# Patient Record
Sex: Male | Born: 1935
Health system: Southern US, Community
[De-identification: ages and names within clinical notes are randomized; demographics above are authoritative.]

## PROBLEM LIST (undated history)

## (undated) DIAGNOSIS — M1990 Unspecified osteoarthritis, unspecified site: Secondary | ICD-10-CM

## (undated) DIAGNOSIS — E785 Hyperlipidemia, unspecified: Secondary | ICD-10-CM

## (undated) DIAGNOSIS — N400 Enlarged prostate without lower urinary tract symptoms: Secondary | ICD-10-CM

## (undated) DIAGNOSIS — E119 Type 2 diabetes mellitus without complications: Secondary | ICD-10-CM

## (undated) DIAGNOSIS — K219 Gastro-esophageal reflux disease without esophagitis: Secondary | ICD-10-CM

## (undated) DIAGNOSIS — I2510 Atherosclerotic heart disease of native coronary artery without angina pectoris: Secondary | ICD-10-CM

## (undated) DIAGNOSIS — I10 Essential (primary) hypertension: Secondary | ICD-10-CM

## (undated) DIAGNOSIS — H9190 Unspecified hearing loss, unspecified ear: Secondary | ICD-10-CM

## (undated) DIAGNOSIS — Z974 Presence of external hearing-aid: Secondary | ICD-10-CM

## (undated) DIAGNOSIS — M5020 Other cervical disc displacement, unspecified cervical region: Secondary | ICD-10-CM

## (undated) DIAGNOSIS — C4441 Basal cell carcinoma of skin of scalp and neck: Secondary | ICD-10-CM

## (undated) HISTORY — DX: Presence of external hearing-aid: Z97.4

## (undated) HISTORY — PX: BASAL CELL CARCINOMA EXCISION: SHX1214

## (undated) HISTORY — DX: Type 2 diabetes mellitus without complications: E11.9

## (undated) HISTORY — DX: Essential (primary) hypertension: I10

## (undated) HISTORY — PX: CERVICAL FUSION: SHX112

## (undated) HISTORY — DX: Atherosclerotic heart disease of native coronary artery without angina pectoris: I25.10

## (undated) HISTORY — DX: Other cervical disc displacement, unspecified cervical region: M50.20

## (undated) HISTORY — PX: COLONOSCOPY: SHX174

## (undated) HISTORY — DX: Benign prostatic hyperplasia without lower urinary tract symptoms: N40.0

## (undated) HISTORY — DX: Gastro-esophageal reflux disease without esophagitis: K21.9

## (undated) HISTORY — DX: Unspecified osteoarthritis, unspecified site: M19.90

## (undated) HISTORY — DX: Hyperlipidemia, unspecified: E78.5

## (undated) HISTORY — DX: Basal cell carcinoma of skin of scalp and neck: C44.41

---

## 1989-09-23 HISTORY — PX: LITHOTRIPSY: SUR834

## 1998-12-19 ENCOUNTER — Ambulatory Visit (HOSPITAL_COMMUNITY): Admission: RE | Admit: 1998-12-19 | Discharge: 1998-12-19 | Payer: Self-pay | Admitting: Orthopedic Surgery

## 1999-09-24 HISTORY — PX: LACERATION REPAIR: SHX5168

## 1999-09-24 HISTORY — PX: KNEE ARTHROSCOPY: SUR90

## 2000-02-22 HISTORY — PX: BUNIONECTOMY: SHX129

## 2000-05-29 ENCOUNTER — Encounter: Admission: RE | Admit: 2000-05-29 | Discharge: 2000-05-29 | Payer: Self-pay | Admitting: *Deleted

## 2000-05-29 ENCOUNTER — Encounter: Payer: Self-pay | Admitting: *Deleted

## 2000-07-17 ENCOUNTER — Ambulatory Visit (HOSPITAL_COMMUNITY): Admission: RE | Admit: 2000-07-17 | Discharge: 2000-07-18 | Payer: Self-pay | Admitting: Cardiology

## 2000-07-24 DIAGNOSIS — E119 Type 2 diabetes mellitus without complications: Secondary | ICD-10-CM

## 2000-07-24 DIAGNOSIS — I10 Essential (primary) hypertension: Secondary | ICD-10-CM

## 2000-07-24 DIAGNOSIS — I2510 Atherosclerotic heart disease of native coronary artery without angina pectoris: Secondary | ICD-10-CM

## 2000-07-24 DIAGNOSIS — E785 Hyperlipidemia, unspecified: Secondary | ICD-10-CM

## 2000-07-24 HISTORY — DX: Hyperlipidemia, unspecified: E78.5

## 2000-07-24 HISTORY — DX: Type 2 diabetes mellitus without complications: E11.9

## 2000-07-24 HISTORY — DX: Atherosclerotic heart disease of native coronary artery without angina pectoris: I25.10

## 2000-07-24 HISTORY — DX: Essential (primary) hypertension: I10

## 2000-10-24 ENCOUNTER — Encounter: Payer: Self-pay | Admitting: Family Medicine

## 2000-10-24 LAB — CONVERTED CEMR LAB
Hgb A1c MFr Bld: 5.5 %
Microalbumin U total vol: 7.9 mg/L
PSA: 0.5 ng/mL

## 2000-10-27 ENCOUNTER — Encounter: Payer: Self-pay | Admitting: Family Medicine

## 2000-10-27 LAB — CONVERTED CEMR LAB
Hgb A1c MFr Bld: 5.5 %
TSH: 1.36 microintl units/mL

## 2000-10-28 ENCOUNTER — Encounter: Payer: Self-pay | Admitting: Family Medicine

## 2000-10-28 LAB — CONVERTED CEMR LAB: Microalbumin U total vol: 7.9 mg/L

## 2000-10-31 ENCOUNTER — Encounter: Payer: Self-pay | Admitting: Family Medicine

## 2000-10-31 LAB — CONVERTED CEMR LAB: PSA: 0.5 ng/mL

## 2001-12-11 ENCOUNTER — Encounter: Payer: Self-pay | Admitting: Family Medicine

## 2001-12-11 LAB — CONVERTED CEMR LAB
PSA: 0.5 ng/mL
TSH: 1.29 microintl units/mL

## 2001-12-29 ENCOUNTER — Encounter: Payer: Self-pay | Admitting: Family Medicine

## 2001-12-29 LAB — CONVERTED CEMR LAB: TSH: 2.64 microintl units/mL

## 2002-03-30 ENCOUNTER — Encounter: Payer: Self-pay | Admitting: Family Medicine

## 2002-03-30 LAB — CONVERTED CEMR LAB: Hgb A1c MFr Bld: 5.8 %

## 2002-04-23 ENCOUNTER — Encounter: Payer: Self-pay | Admitting: Family Medicine

## 2002-04-23 LAB — CONVERTED CEMR LAB
Hgb A1c MFr Bld: 5.9 %
Microalbumin U total vol: 6 mg/L
PSA: 0.5 ng/mL

## 2002-05-19 ENCOUNTER — Encounter: Payer: Self-pay | Admitting: Family Medicine

## 2002-05-19 LAB — CONVERTED CEMR LAB
Hgb A1c MFr Bld: 5.9 %
Microalbumin U total vol: 6 mg/L
PSA: 0.5 ng/mL
TSH: 1.24 microintl units/mL

## 2002-11-22 ENCOUNTER — Encounter: Payer: Self-pay | Admitting: Family Medicine

## 2002-11-22 LAB — CONVERTED CEMR LAB: Hgb A1c MFr Bld: 6.9 %

## 2002-11-25 ENCOUNTER — Encounter: Payer: Self-pay | Admitting: Family Medicine

## 2002-11-25 LAB — CONVERTED CEMR LAB: Hgb A1c MFr Bld: 6.9 %

## 2003-03-24 ENCOUNTER — Encounter: Payer: Self-pay | Admitting: Family Medicine

## 2003-03-24 LAB — CONVERTED CEMR LAB: Hgb A1c MFr Bld: 5.8 %

## 2003-09-21 ENCOUNTER — Encounter: Payer: Self-pay | Admitting: Family Medicine

## 2003-09-21 LAB — CONVERTED CEMR LAB: Microalbumin U total vol: 3.5 mg/L

## 2003-09-24 ENCOUNTER — Encounter: Payer: Self-pay | Admitting: Family Medicine

## 2003-09-24 LAB — CONVERTED CEMR LAB
Hgb A1c MFr Bld: 6.8 %
Microalbumin U total vol: 3.5 mg/L
PSA: 0.6 ng/mL

## 2003-09-29 ENCOUNTER — Encounter: Payer: Self-pay | Admitting: Family Medicine

## 2003-09-29 LAB — CONVERTED CEMR LAB: Hgb A1c MFr Bld: 6.8 %

## 2003-09-30 ENCOUNTER — Encounter: Payer: Self-pay | Admitting: Family Medicine

## 2003-09-30 LAB — CONVERTED CEMR LAB: PSA: 0.6 ng/mL

## 2003-10-25 HISTORY — PX: ROTATOR CUFF REPAIR: SHX139

## 2003-11-02 ENCOUNTER — Encounter: Admission: RE | Admit: 2003-11-02 | Discharge: 2003-11-02 | Payer: Self-pay | Admitting: Orthopaedic Surgery

## 2003-11-03 ENCOUNTER — Ambulatory Visit (HOSPITAL_BASED_OUTPATIENT_CLINIC_OR_DEPARTMENT_OTHER): Admission: RE | Admit: 2003-11-03 | Discharge: 2003-11-04 | Payer: Self-pay | Admitting: Orthopaedic Surgery

## 2003-11-03 ENCOUNTER — Ambulatory Visit (HOSPITAL_COMMUNITY): Admission: RE | Admit: 2003-11-03 | Discharge: 2003-11-03 | Payer: Self-pay | Admitting: Orthopaedic Surgery

## 2003-12-23 ENCOUNTER — Encounter: Payer: Self-pay | Admitting: Family Medicine

## 2003-12-23 LAB — CONVERTED CEMR LAB: Hgb A1c MFr Bld: 6.2 %

## 2004-01-04 ENCOUNTER — Encounter: Payer: Self-pay | Admitting: Family Medicine

## 2004-01-04 LAB — CONVERTED CEMR LAB: Hgb A1c MFr Bld: 6.2 %

## 2004-07-24 ENCOUNTER — Encounter: Payer: Self-pay | Admitting: Family Medicine

## 2004-07-24 LAB — CONVERTED CEMR LAB: Hgb A1c MFr Bld: 6.2 %

## 2004-07-27 ENCOUNTER — Ambulatory Visit: Payer: Self-pay | Admitting: Family Medicine

## 2004-07-30 ENCOUNTER — Encounter: Payer: Self-pay | Admitting: Family Medicine

## 2004-07-30 LAB — CONVERTED CEMR LAB: Hgb A1c MFr Bld: 6.2 %

## 2004-08-23 ENCOUNTER — Encounter: Payer: Self-pay | Admitting: Family Medicine

## 2004-08-23 LAB — CONVERTED CEMR LAB
Hgb A1c MFr Bld: 5.9 %
Microalbumin U total vol: 16.1 mg/L
PSA: 0.54 ng/mL

## 2004-08-30 ENCOUNTER — Ambulatory Visit: Payer: Self-pay | Admitting: Family Medicine

## 2004-08-30 LAB — CONVERTED CEMR LAB
PSA: 0.54 ng/mL
TSH: 1.44 microintl units/mL

## 2004-09-04 ENCOUNTER — Ambulatory Visit: Payer: Self-pay | Admitting: Family Medicine

## 2004-10-19 ENCOUNTER — Ambulatory Visit: Payer: Self-pay | Admitting: Family Medicine

## 2005-02-21 ENCOUNTER — Encounter: Payer: Self-pay | Admitting: Family Medicine

## 2005-02-21 LAB — CONVERTED CEMR LAB: Hgb A1c MFr Bld: 5.6 %

## 2005-03-04 ENCOUNTER — Ambulatory Visit: Payer: Self-pay | Admitting: Family Medicine

## 2005-03-04 LAB — CONVERTED CEMR LAB: Hgb A1c MFr Bld: 5.6 %

## 2005-03-06 ENCOUNTER — Ambulatory Visit: Payer: Self-pay | Admitting: Family Medicine

## 2005-04-04 ENCOUNTER — Ambulatory Visit: Payer: Self-pay | Admitting: Family Medicine

## 2005-06-10 ENCOUNTER — Ambulatory Visit: Payer: Self-pay | Admitting: Family Medicine

## 2005-11-21 ENCOUNTER — Encounter: Payer: Self-pay | Admitting: Family Medicine

## 2005-11-21 LAB — CONVERTED CEMR LAB
Hgb A1c MFr Bld: 5.6 %
PSA: 0.93 ng/mL

## 2005-11-26 ENCOUNTER — Ambulatory Visit: Payer: Self-pay | Admitting: Family Medicine

## 2005-11-26 LAB — CONVERTED CEMR LAB
Hgb A1c MFr Bld: 5.6 %
Microalbumin U total vol: 3.3 mg/L
TSH: 1.6 microintl units/mL

## 2005-11-27 ENCOUNTER — Encounter: Payer: Self-pay | Admitting: Family Medicine

## 2005-11-27 LAB — CONVERTED CEMR LAB: PSA: 0.93 ng/mL

## 2005-11-28 ENCOUNTER — Ambulatory Visit: Payer: Self-pay | Admitting: Family Medicine

## 2005-12-25 ENCOUNTER — Ambulatory Visit: Payer: Self-pay | Admitting: Family Medicine

## 2006-03-14 ENCOUNTER — Ambulatory Visit: Payer: Self-pay | Admitting: Family Medicine

## 2006-04-17 ENCOUNTER — Emergency Department (HOSPITAL_COMMUNITY): Admission: EM | Admit: 2006-04-17 | Discharge: 2006-04-17 | Payer: Self-pay | Admitting: Emergency Medicine

## 2006-04-25 ENCOUNTER — Ambulatory Visit: Payer: Self-pay | Admitting: Family Medicine

## 2006-04-28 ENCOUNTER — Ambulatory Visit: Payer: Self-pay | Admitting: Family Medicine

## 2006-05-24 ENCOUNTER — Encounter: Payer: Self-pay | Admitting: Family Medicine

## 2006-05-24 LAB — CONVERTED CEMR LAB: Hgb A1c MFr Bld: 5.5 %

## 2006-06-10 ENCOUNTER — Ambulatory Visit: Payer: Self-pay | Admitting: Family Medicine

## 2006-06-10 LAB — CONVERTED CEMR LAB: Hgb A1c MFr Bld: 5.5 %

## 2006-06-12 ENCOUNTER — Ambulatory Visit: Payer: Self-pay | Admitting: Family Medicine

## 2006-07-16 ENCOUNTER — Ambulatory Visit: Payer: Self-pay | Admitting: Family Medicine

## 2006-08-08 ENCOUNTER — Emergency Department (HOSPITAL_COMMUNITY): Admission: EM | Admit: 2006-08-08 | Discharge: 2006-08-08 | Payer: Self-pay | Admitting: Emergency Medicine

## 2006-08-28 ENCOUNTER — Ambulatory Visit: Payer: Self-pay | Admitting: Family Medicine

## 2006-12-08 ENCOUNTER — Ambulatory Visit: Payer: Self-pay | Admitting: Family Medicine

## 2006-12-08 LAB — CONVERTED CEMR LAB
ALT: 26 units/L (ref 0–40)
AST: 24 units/L (ref 0–37)
Albumin: 3.9 g/dL (ref 3.5–5.2)
Alkaline Phosphatase: 37 units/L — ABNORMAL LOW (ref 39–117)
BUN: 14 mg/dL (ref 6–23)
Basophils Absolute: 0 10*3/uL (ref 0.0–0.1)
Basophils Relative: 0.1 % (ref 0.0–1.0)
Bilirubin, Direct: 0.1 mg/dL (ref 0.0–0.3)
CO2: 28 meq/L (ref 19–32)
Calcium: 9.5 mg/dL (ref 8.4–10.5)
Chloride: 104 meq/L (ref 96–112)
Cholesterol: 113 mg/dL (ref 0–200)
Creatinine, Ser: 0.9 mg/dL (ref 0.4–1.5)
Eosinophils Absolute: 0.1 10*3/uL (ref 0.0–0.6)
Eosinophils Relative: 2 % (ref 0.0–5.0)
GFR calc Af Amer: 106 mL/min
GFR calc non Af Amer: 88 mL/min
Glucose, Bld: 114 mg/dL — ABNORMAL HIGH (ref 70–99)
HCT: 40.6 % (ref 39.0–52.0)
HDL: 46.3 mg/dL (ref >39.0)
Hemoglobin: 14 g/dL (ref 13.0–17.0)
Hgb A1c MFr Bld: 5.7 % (ref 4.6–6.0)
LDL Cholesterol: 51 mg/dL (ref 0–99)
Lymphocytes Relative: 29.9 % (ref 12.0–46.0)
MCHC: 34.6 g/dL (ref 30.0–36.0)
MCV: 94.7 fL (ref 78.0–100.0)
Monocytes Absolute: 0.6 10*3/uL (ref 0.2–0.7)
Monocytes Relative: 10.2 % (ref 3.0–11.0)
Neutro Abs: 3.4 10*3/uL (ref 1.4–7.7)
Neutrophils Relative %: 57.8 % (ref 43.0–77.0)
PSA: 0.77 ng/mL (ref 0.10–4.00)
Platelets: 134 10*3/uL — ABNORMAL LOW (ref 150–400)
Potassium: 4.5 meq/L (ref 3.5–5.1)
RBC: 4.28 M/uL (ref 4.22–5.81)
RDW: 12.5 % (ref 11.5–14.6)
Sodium: 140 meq/L (ref 135–145)
Total Bilirubin: 0.8 mg/dL (ref 0.3–1.2)
Total CHOL/HDL Ratio: 2.4
Total Protein: 6 g/dL (ref 6.0–8.3)
Triglycerides: 78 mg/dL (ref 0–149)
VLDL: 16 mg/dL (ref 0–40)
WBC: 5.8 10*3/uL (ref 4.5–10.5)

## 2006-12-09 ENCOUNTER — Encounter: Payer: Self-pay | Admitting: Family Medicine

## 2006-12-09 LAB — CONVERTED CEMR LAB
Hgb A1c MFr Bld: 5.7 %
PSA: 0.77 ng/mL

## 2006-12-11 ENCOUNTER — Ambulatory Visit: Payer: Self-pay | Admitting: Family Medicine

## 2007-01-05 ENCOUNTER — Ambulatory Visit: Payer: Self-pay | Admitting: Family Medicine

## 2007-04-13 ENCOUNTER — Encounter: Payer: Self-pay | Admitting: Family Medicine

## 2007-04-14 DIAGNOSIS — I2510 Atherosclerotic heart disease of native coronary artery without angina pectoris: Secondary | ICD-10-CM | POA: Insufficient documentation

## 2007-04-14 DIAGNOSIS — E1129 Type 2 diabetes mellitus with other diabetic kidney complication: Secondary | ICD-10-CM

## 2007-04-14 DIAGNOSIS — E119 Type 2 diabetes mellitus without complications: Secondary | ICD-10-CM | POA: Insufficient documentation

## 2007-04-14 DIAGNOSIS — R809 Proteinuria, unspecified: Secondary | ICD-10-CM

## 2007-04-14 DIAGNOSIS — I119 Hypertensive heart disease without heart failure: Secondary | ICD-10-CM | POA: Insufficient documentation

## 2007-04-14 DIAGNOSIS — E785 Hyperlipidemia, unspecified: Secondary | ICD-10-CM | POA: Insufficient documentation

## 2007-06-02 ENCOUNTER — Encounter (INDEPENDENT_AMBULATORY_CARE_PROVIDER_SITE_OTHER): Payer: Self-pay | Admitting: *Deleted

## 2007-06-11 ENCOUNTER — Ambulatory Visit: Payer: Self-pay | Admitting: Family Medicine

## 2007-06-11 LAB — CONVERTED CEMR LAB: Hgb A1c MFr Bld: 5.7 % (ref 4.6–6.0)

## 2007-06-29 ENCOUNTER — Ambulatory Visit: Payer: Self-pay | Admitting: Family Medicine

## 2007-07-31 ENCOUNTER — Ambulatory Visit: Payer: Self-pay | Admitting: Family Medicine

## 2007-10-26 ENCOUNTER — Encounter: Payer: Self-pay | Admitting: Family Medicine

## 2007-12-16 ENCOUNTER — Encounter: Payer: Self-pay | Admitting: Family Medicine

## 2007-12-16 DIAGNOSIS — C4441 Basal cell carcinoma of skin of scalp and neck: Secondary | ICD-10-CM

## 2007-12-16 HISTORY — DX: Basal cell carcinoma of skin of scalp and neck: C44.41

## 2007-12-21 ENCOUNTER — Ambulatory Visit: Payer: Self-pay | Admitting: Family Medicine

## 2007-12-21 LAB — CONVERTED CEMR LAB
ALT: 27 units/L (ref 0–53)
AST: 26 units/L (ref 0–37)
Albumin: 3.4 g/dL — ABNORMAL LOW (ref 3.5–5.2)
Alkaline Phosphatase: 39 units/L (ref 39–117)
BUN: 11 mg/dL (ref 6–23)
Basophils Absolute: 0 10*3/uL (ref 0.0–0.1)
Basophils Relative: 0.5 % (ref 0.0–1.0)
Bilirubin, Direct: 0.2 mg/dL (ref 0.0–0.3)
CO2: 33 meq/L — ABNORMAL HIGH (ref 19–32)
Calcium: 9.4 mg/dL (ref 8.4–10.5)
Chloride: 106 meq/L (ref 96–112)
Cholesterol: 123 mg/dL (ref 0–200)
Creatinine, Ser: 0.9 mg/dL (ref 0.4–1.5)
Creatinine,U: 77.2 mg/dL
Eosinophils Absolute: 0.1 10*3/uL (ref 0.0–0.7)
Eosinophils Relative: 2.2 % (ref 0.0–5.0)
GFR calc Af Amer: 106 mL/min
GFR calc non Af Amer: 88 mL/min
Glucose, Bld: 104 mg/dL — ABNORMAL HIGH (ref 70–99)
HCT: 41.5 % (ref 39.0–52.0)
HDL: 42.8 mg/dL (ref >39.0)
Hemoglobin: 14 g/dL (ref 13.0–17.0)
Hgb A1c MFr Bld: 5.8 % (ref 4.6–6.0)
LDL Cholesterol: 60 mg/dL (ref 0–99)
Lymphocytes Relative: 35.7 % (ref 12.0–46.0)
MCHC: 33.6 g/dL (ref 30.0–36.0)
MCV: 96.1 fL (ref 78.0–100.0)
Microalb Creat Ratio: 9.1 mg/g (ref 0.0–30.0)
Microalb, Ur: 0.7 mg/dL (ref 0.0–1.9)
Monocytes Absolute: 0.6 10*3/uL (ref 0.1–1.0)
Monocytes Relative: 11.2 % (ref 3.0–12.0)
Neutro Abs: 2.9 10*3/uL (ref 1.4–7.7)
Neutrophils Relative %: 50.4 % (ref 43.0–77.0)
PSA: 0.52 ng/mL (ref 0.10–4.00)
Platelets: 147 10*3/uL — ABNORMAL LOW (ref 150–400)
Potassium: 4.7 meq/L (ref 3.5–5.1)
RBC: 4.32 M/uL (ref 4.22–5.81)
RDW: 12.9 % (ref 11.5–14.6)
Sodium: 143 meq/L (ref 135–145)
TSH: 2.05 microintl units/mL (ref 0.35–5.50)
Total Bilirubin: 0.9 mg/dL (ref 0.3–1.2)
Total CHOL/HDL Ratio: 2.9
Total Protein: 6.3 g/dL (ref 6.0–8.3)
Triglycerides: 103 mg/dL (ref 0–149)
VLDL: 21 mg/dL (ref 0–40)
WBC: 5.6 10*3/uL (ref 4.5–10.5)

## 2007-12-23 ENCOUNTER — Encounter: Payer: Self-pay | Admitting: Family Medicine

## 2007-12-25 ENCOUNTER — Ambulatory Visit: Payer: Self-pay | Admitting: Family Medicine

## 2008-02-08 ENCOUNTER — Ambulatory Visit: Payer: Self-pay | Admitting: Family Medicine

## 2008-02-08 LAB — CONVERTED CEMR LAB
ALT: 28 units/L (ref 0–53)
AST: 27 units/L (ref 0–37)

## 2008-02-12 ENCOUNTER — Ambulatory Visit: Payer: Self-pay | Admitting: Family Medicine

## 2008-02-12 ENCOUNTER — Encounter (INDEPENDENT_AMBULATORY_CARE_PROVIDER_SITE_OTHER): Payer: Self-pay | Admitting: *Deleted

## 2008-02-12 LAB — CONVERTED CEMR LAB
OCCULT 1: NEGATIVE
OCCULT 2: NEGATIVE
OCCULT 3: NEGATIVE

## 2008-03-29 ENCOUNTER — Encounter: Payer: Self-pay | Admitting: Family Medicine

## 2008-04-01 ENCOUNTER — Ambulatory Visit: Payer: Self-pay | Admitting: Family Medicine

## 2008-04-01 LAB — CONVERTED CEMR LAB
ALT: 30 units/L (ref 0–53)
AST: 29 units/L (ref 0–37)
Cholesterol: 120 mg/dL (ref 0–200)
HDL: 42.5 mg/dL (ref >39.0)
LDL Cholesterol: 59 mg/dL (ref 0–99)
Total CHOL/HDL Ratio: 2.8
Triglycerides: 94 mg/dL (ref 0–149)
VLDL: 19 mg/dL (ref 0–40)

## 2008-04-05 ENCOUNTER — Ambulatory Visit: Payer: Self-pay | Admitting: Family Medicine

## 2008-06-28 ENCOUNTER — Ambulatory Visit: Payer: Self-pay | Admitting: Family Medicine

## 2008-06-28 LAB — CONVERTED CEMR LAB: Hgb A1c MFr Bld: 5.8 % (ref 4.6–6.0)

## 2008-07-14 ENCOUNTER — Ambulatory Visit: Payer: Self-pay | Admitting: Family Medicine

## 2008-12-26 ENCOUNTER — Ambulatory Visit: Payer: Self-pay | Admitting: Family Medicine

## 2008-12-27 LAB — CONVERTED CEMR LAB
ALT: 27 units/L (ref 0–53)
AST: 28 units/L (ref 0–37)
Albumin: 3.8 g/dL (ref 3.5–5.2)
Alkaline Phosphatase: 45 units/L (ref 39–117)
BUN: 21 mg/dL (ref 6–23)
Basophils Absolute: 0 10*3/uL (ref 0.0–0.1)
Basophils Relative: 0.2 % (ref 0.0–3.0)
Bilirubin, Direct: 0.2 mg/dL (ref 0.0–0.3)
CO2: 28 meq/L (ref 19–32)
Calcium: 9.4 mg/dL (ref 8.4–10.5)
Chloride: 109 meq/L (ref 96–112)
Cholesterol: 121 mg/dL (ref 0–200)
Creatinine, Ser: 0.9 mg/dL (ref 0.4–1.5)
Creatinine,U: 201 mg/dL
Eosinophils Absolute: 0.1 10*3/uL (ref 0.0–0.7)
Eosinophils Relative: 2.7 % (ref 0.0–5.0)
GFR calc non Af Amer: 87.26 mL/min (ref >60)
Glucose, Bld: 123 mg/dL — ABNORMAL HIGH (ref 70–99)
HCT: 39.6 % (ref 39.0–52.0)
HDL: 44.9 mg/dL (ref >39.00)
Hemoglobin: 13.7 g/dL (ref 13.0–17.0)
Hgb A1c MFr Bld: 5.8 % (ref 4.6–6.5)
LDL Cholesterol: 56 mg/dL (ref 0–99)
Lymphocytes Relative: 39 % (ref 12.0–46.0)
Lymphs Abs: 1.9 10*3/uL (ref 0.7–4.0)
MCHC: 34.7 g/dL (ref 30.0–36.0)
MCV: 95.2 fL (ref 78.0–100.0)
Microalb Creat Ratio: 9 mg/g (ref 0.0–30.0)
Microalb, Ur: 1.8 mg/dL (ref 0.0–1.9)
Monocytes Absolute: 0.6 10*3/uL (ref 0.1–1.0)
Monocytes Relative: 12.5 % — ABNORMAL HIGH (ref 3.0–12.0)
Neutro Abs: 2.4 10*3/uL (ref 1.4–7.7)
Neutrophils Relative %: 45.6 % (ref 43.0–77.0)
PSA: 0.29 ng/mL (ref 0.10–4.00)
Platelets: 122 10*3/uL — ABNORMAL LOW (ref 150.0–400.0)
Potassium: 5.1 meq/L (ref 3.5–5.1)
RBC: 4.16 M/uL — ABNORMAL LOW (ref 4.22–5.81)
RDW: 12.8 % (ref 11.5–14.6)
Sodium: 141 meq/L (ref 135–145)
TSH: 1.77 microintl units/mL (ref 0.35–5.50)
Total Bilirubin: 1 mg/dL (ref 0.3–1.2)
Total CHOL/HDL Ratio: 3
Total Protein: 6 g/dL (ref 6.0–8.3)
Triglycerides: 99 mg/dL (ref 0.0–149.0)
VLDL: 19.8 mg/dL (ref 0.0–40.0)
WBC: 5 10*3/uL (ref 4.5–10.5)

## 2008-12-28 ENCOUNTER — Ambulatory Visit: Payer: Self-pay | Admitting: Family Medicine

## 2009-01-31 ENCOUNTER — Ambulatory Visit: Payer: Self-pay | Admitting: Family Medicine

## 2009-02-01 ENCOUNTER — Ambulatory Visit: Payer: Self-pay | Admitting: Family Medicine

## 2009-02-01 ENCOUNTER — Encounter (INDEPENDENT_AMBULATORY_CARE_PROVIDER_SITE_OTHER): Payer: Self-pay | Admitting: *Deleted

## 2009-02-01 LAB — CONVERTED CEMR LAB
OCCULT 1: NEGATIVE
OCCULT 2: NEGATIVE
OCCULT 3: NEGATIVE

## 2009-02-07 ENCOUNTER — Encounter: Admission: RE | Admit: 2009-02-07 | Discharge: 2009-02-07 | Payer: Self-pay | Admitting: Internal Medicine

## 2009-03-10 ENCOUNTER — Encounter: Admission: RE | Admit: 2009-03-10 | Discharge: 2009-03-10 | Payer: Self-pay | Admitting: Internal Medicine

## 2009-04-03 ENCOUNTER — Encounter: Payer: Self-pay | Admitting: Family Medicine

## 2009-04-11 ENCOUNTER — Telehealth: Payer: Self-pay | Admitting: Family Medicine

## 2009-04-12 ENCOUNTER — Ambulatory Visit: Payer: Self-pay | Admitting: Family Medicine

## 2009-04-12 DIAGNOSIS — N400 Enlarged prostate without lower urinary tract symptoms: Secondary | ICD-10-CM | POA: Insufficient documentation

## 2009-06-22 ENCOUNTER — Ambulatory Visit: Payer: Self-pay | Admitting: Family Medicine

## 2009-06-26 ENCOUNTER — Ambulatory Visit: Payer: Self-pay | Admitting: Family Medicine

## 2009-06-26 LAB — CONVERTED CEMR LAB: Hgb A1c MFr Bld: 6 % (ref 4.6–6.5)

## 2009-06-29 ENCOUNTER — Ambulatory Visit: Payer: Self-pay | Admitting: Family Medicine

## 2009-08-23 ENCOUNTER — Telehealth: Payer: Self-pay | Admitting: Family Medicine

## 2009-12-15 ENCOUNTER — Telehealth: Payer: Self-pay | Admitting: Family Medicine

## 2009-12-22 ENCOUNTER — Encounter: Payer: Self-pay | Admitting: Family Medicine

## 2009-12-22 LAB — HM DIABETES EYE EXAM: HM Diabetic Eye Exam: NORMAL

## 2009-12-27 ENCOUNTER — Ambulatory Visit: Payer: Self-pay | Admitting: Family Medicine

## 2009-12-27 LAB — CONVERTED CEMR LAB
ALT: 31 units/L (ref 0–53)
AST: 32 units/L (ref 0–37)
Albumin: 3.9 g/dL (ref 3.5–5.2)
Alkaline Phosphatase: 55 units/L (ref 39–117)
BUN: 12 mg/dL (ref 6–23)
Basophils Absolute: 0 10*3/uL (ref 0.0–0.1)
Basophils Relative: 0.6 % (ref 0.0–3.0)
Bilirubin, Direct: 0.2 mg/dL (ref 0.0–0.3)
CO2: 28 meq/L (ref 19–32)
Calcium: 9.2 mg/dL (ref 8.4–10.5)
Chloride: 104 meq/L (ref 96–112)
Cholesterol: 92 mg/dL (ref 0–200)
Creatinine, Ser: 0.8 mg/dL (ref 0.4–1.5)
Creatinine,U: 104.3 mg/dL
Eosinophils Absolute: 0.1 10*3/uL (ref 0.0–0.7)
Eosinophils Relative: 2.2 % (ref 0.0–5.0)
GFR calc non Af Amer: 99.7 mL/min (ref >60)
Glucose, Bld: 114 mg/dL — ABNORMAL HIGH (ref 70–99)
HCT: 39.6 % (ref 39.0–52.0)
HDL: 44.2 mg/dL (ref >39.00)
Hemoglobin: 13.7 g/dL (ref 13.0–17.0)
Hgb A1c MFr Bld: 6 % (ref 4.6–6.5)
LDL Cholesterol: 37 mg/dL (ref 0–99)
Lymphocytes Relative: 37 % (ref 12.0–46.0)
Lymphs Abs: 2.3 10*3/uL (ref 0.7–4.0)
MCHC: 34.7 g/dL (ref 30.0–36.0)
MCV: 92.7 fL (ref 78.0–100.0)
Microalb Creat Ratio: 93 mg/g — ABNORMAL HIGH (ref 0.0–30.0)
Microalb, Ur: 9.7 mg/dL — ABNORMAL HIGH (ref 0.0–1.9)
Monocytes Absolute: 0.7 10*3/uL (ref 0.1–1.0)
Monocytes Relative: 10.7 % (ref 3.0–12.0)
Neutro Abs: 3.1 10*3/uL (ref 1.4–7.7)
Neutrophils Relative %: 49.5 % (ref 43.0–77.0)
PSA: 0.36 ng/mL (ref 0.10–4.00)
Platelets: 134 10*3/uL — ABNORMAL LOW (ref 150.0–400.0)
Potassium: 4.3 meq/L (ref 3.5–5.1)
RBC: 4.27 M/uL (ref 4.22–5.81)
RDW: 14.1 % (ref 11.5–14.6)
Sodium: 139 meq/L (ref 135–145)
TSH: 1.63 microintl units/mL (ref 0.35–5.50)
Total Bilirubin: 0.7 mg/dL (ref 0.3–1.2)
Total CHOL/HDL Ratio: 2
Total Protein: 6.3 g/dL (ref 6.0–8.3)
Triglycerides: 53 mg/dL (ref 0.0–149.0)
VLDL: 10.6 mg/dL (ref 0.0–40.0)
Vitamin B-12: 801 pg/mL (ref 211–911)
WBC: 6.2 10*3/uL (ref 4.5–10.5)

## 2009-12-28 LAB — CONVERTED CEMR LAB: Vit D, 25-Hydroxy: 37 ng/mL (ref 30–89)

## 2010-01-01 ENCOUNTER — Ambulatory Visit: Payer: Self-pay | Admitting: Family Medicine

## 2010-02-02 ENCOUNTER — Ambulatory Visit: Payer: Self-pay | Admitting: Family Medicine

## 2010-02-02 LAB — CONVERTED CEMR LAB
OCCULT 1: NEGATIVE
OCCULT 2: NEGATIVE
OCCULT 3: NEGATIVE

## 2010-02-02 LAB — FECAL OCCULT BLOOD, GUAIAC: Fecal Occult Blood: NEGATIVE

## 2010-02-05 ENCOUNTER — Encounter (INDEPENDENT_AMBULATORY_CARE_PROVIDER_SITE_OTHER): Payer: Self-pay | Admitting: *Deleted

## 2010-02-12 ENCOUNTER — Ambulatory Visit: Payer: Self-pay | Admitting: Family Medicine

## 2010-02-12 LAB — CONVERTED CEMR LAB
ALT: 32 units/L (ref 0–53)
AST: 30 units/L (ref 0–37)

## 2010-04-02 ENCOUNTER — Ambulatory Visit: Payer: Self-pay | Admitting: Family Medicine

## 2010-04-03 LAB — CONVERTED CEMR LAB
ALT: 22 units/L (ref 0–53)
AST: 23 units/L (ref 0–37)
BUN: 15 mg/dL (ref 6–23)
CO2: 33 meq/L — ABNORMAL HIGH (ref 19–32)
Calcium: 9.3 mg/dL (ref 8.4–10.5)
Chloride: 105 meq/L (ref 96–112)
Creatinine, Ser: 0.9 mg/dL (ref 0.4–1.5)
GFR calc non Af Amer: 88.1 mL/min (ref >60)
Glucose, Bld: 116 mg/dL — ABNORMAL HIGH (ref 70–99)
Hgb A1c MFr Bld: 6.1 % (ref 4.6–6.5)
Potassium: 4.9 meq/L (ref 3.5–5.1)
Sodium: 140 meq/L (ref 135–145)

## 2010-04-04 ENCOUNTER — Ambulatory Visit: Payer: Self-pay | Admitting: Family Medicine

## 2010-04-26 ENCOUNTER — Encounter (INDEPENDENT_AMBULATORY_CARE_PROVIDER_SITE_OTHER): Payer: Self-pay | Admitting: *Deleted

## 2010-08-22 ENCOUNTER — Ambulatory Visit: Payer: Self-pay | Admitting: Family Medicine

## 2010-08-22 LAB — HM DIABETES FOOT EXAM

## 2010-08-30 ENCOUNTER — Telehealth: Payer: Self-pay | Admitting: Family Medicine

## 2010-09-12 ENCOUNTER — Telehealth: Payer: Self-pay | Admitting: Family Medicine

## 2010-10-09 ENCOUNTER — Encounter: Payer: Self-pay | Admitting: Family Medicine

## 2010-10-25 NOTE — Miscellaneous (Signed)
  Clinical Lists Changes  Problems: Added new problem of NEOPLASM, MALIGNANT, CARCINOMA, BASAL CELL, RECURRENT SCALP (ICD-173.9) Observations: Added new observation of PAST SURG HX: RUPTURED DISC IN NECK C7 (DR CLONINGER) LITHOTRIPSY (DR PETERSEN) 1991 R HAND LAC REPAIR (DR KUZMA) 1996 R KNEE ARTHROSCOPY 09/1999 R BUNIONECTOMY (DR PETRINITZ) 02/2000 ETT POS  CATH STENT 07/2000 STRESS CARDIOLITE WNL EF 65%:(11/11/2002) ROTATOR CUFF REPAIR RIGHT (DR. August Saucer):  (10/2003) CARDIOLITE EF 57% NORMAL:(10/01/2006) REEXCISION SCALP BCC (DR Talmadge Coventry Ollen Bowl) (12/16/2007) (12/23/2007 8:11)       Past Surgical History:    RUPTURED DISC IN NECK C7 (DR CLONINGER)    LITHOTRIPSY (DR PETERSEN) 1991    R HAND LAC REPAIR (DR KUZMA) 1996    R KNEE ARTHROSCOPY 09/1999    R BUNIONECTOMY (DR PETRINITZ) 02/2000    ETT POS  CATH STENT 07/2000    STRESS CARDIOLITE WNL EF 65%:(11/11/2002)    ROTATOR CUFF REPAIR RIGHT (DR. August Saucer):  (10/2003)    CARDIOLITE EF 57% NORMAL:(10/01/2006)    REEXCISION SCALP BCC (DR Talmadge Coventry Ollen Bowl) (12/16/2007)

## 2010-10-25 NOTE — Assessment & Plan Note (Signed)
Summary: 6 MONTH FOLLOW UP/RBH   Vital Signs:  Patient Profile:   77 Years Old Male Height:     67 inches Weight:      187 pounds Temp:     97.3 degrees F oral Pulse rate:   56 / minute Pulse rhythm:   regular BP sitting:   120 / 60  (left arm) Cuff size:   regular  Vitals Entered By: Providence Crosby (July 14, 2008 10:11 AM)                 Chief Complaint:  6 month followup states medications the same.  History of Present Illness: Here for 6 mo followup, feeling well with no complaints. 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 Has had no problems since being seen.  Continues to watch his sugar intake...wife won't let him eat "anything but lettuce!" Is verty active. Sugars run 100-120.    Prior Medications Reviewed Using: Patient Recall  Current Allergies: No known allergies       Physical Exam  General:     Well-developed,well-nourished,in no acute distress; alert,appropriate and cooperative throughout examination Head:     Normocephalic and atraumatic without obvious abnormalities. No apparent alopecia or balding. Eyes:     Conjunctiva clear bilaterally.  Ears:     External ear exam shows no significant lesions or deformities.  Otoscopic examination reveals clear canals, tympanic membranes are intact bilaterally without bulging, retraction, inflammation or discharge. Hearing is grossly normal bilaterally. H/As bilat. Nose:     External nasal examination shows no deformity or inflammation. Nasal mucosa are pink and moist without lesions or exudates. Mouth:     Oral mucosa and oropharynx without lesions or exudates.  Teeth in good repair. Neck:     No deformities, masses, or tenderness noted. Chest Wall:     No deformities, masses, tenderness or gynecomastia noted. Lungs:     Normal respiratory effort, chest expands symmetrically. Lungs are clear to auscultation, no crackles or wheezes. Heart:     Normal rate and regular rhythm. S1 and S2 normal  without gallop, murmur, click, rub or other extra sounds. Abdomen:     Bowel sounds positive,abdomen soft and non-tender without masses, organomegaly or hernias noted.    Impression & Recommendations:  Problem # 1:  DIABETES MELLITUS, TYPE II (ICD-250.00) Assessment: Improved Great control. Continue as is, may decrease Glyburide next time. His updated medication list for this problem includes:    Metformin Hcl 1000 Mg Tabs (Metformin hcl) .Marland Kitchen... Take one by mouth two times a day    Glyburide 5 Mg Tabs (Glyburide) .Marland Kitchen... Take one by mouth every am    Lisinopril 10 Mg Tabs (Lisinopril) .Marland Kitchen... Take one by mouth once a day    Avandia 8 Mg Tabs (Rosiglitazone maleate) .Marland Kitchen... Take one by mouth once a day    Aspirin 81 Mg Tbec (Aspirin) .Marland Kitchen... Take one by mouth once a day  Labs Reviewed: HgBA1c: 5.8 (06/28/2008)   Creat: 0.9 (12/21/2007)   Microalbumin: 0.7 (12/21/2007)  Last Eye Exam: normal (03/29/2008)   Problem # 2:  HYPERTENSION (ICD-401.9) Assessment: Unchanged Stable. His updated medication list for this problem includes:    Lisinopril 10 Mg Tabs (Lisinopril) .Marland Kitchen... Take one by mouth once a day    Toprol Xl 25 Mg Tb24 (Metoprolol succinate) .Marland Kitchen... Take one by mouth once a day  BP today: 120/60 Prior BP: 120/60 (04/05/2008)  Labs Reviewed: Creat: 0.9 (12/21/2007) Chol: 120 (04/01/2008)   HDL: 42.5 (04/01/2008)  LDL: 59 (04/01/2008)   TG: 94 (04/01/2008)   Problem # 3:  CORONARY ARTERY DISEASE (ICD-414.00) Assessment: Unchanged Denies chest pain with all of his activities. His updated medication list for this problem includes:    Lisinopril 10 Mg Tabs (Lisinopril) .Marland Kitchen... Take one by mouth once a day    Aspirin 81 Mg Tbec (Aspirin) .Marland Kitchen... Take one by mouth once a day    Toprol Xl 25 Mg Tb24 (Metoprolol succinate) .Marland Kitchen... Take one by mouth once a day  Labs Reviewed: Chol: 120 (04/01/2008)   HDL: 42.5 (04/01/2008)   LDL: 59 (04/01/2008)   TG: 94 (04/01/2008)   Complete Medication  List: 1)  Metformin Hcl 1000 Mg Tabs (Metformin hcl) .... Take one by mouth two times a day 2)  Flomax 0.4 Mg Cp24 (Tamsulosin hcl) .... Take one by mouth once a day 3)  Zetia 10 Mg Tabs (Ezetimibe) .... Take one by mouth once a day 4)  Avodart 0.5 Mg Caps (Dutasteride) .... Take one by mouth once a day 5)  Glyburide 5 Mg Tabs (Glyburide) .... Take one by mouth every am 6)  Lisinopril 10 Mg Tabs (Lisinopril) .... Take one by mouth once a day 7)  Avandia 8 Mg Tabs (Rosiglitazone maleate) .... Take one by mouth once a day 8)  Multivitamins Tabs (Multiple vitamin) .... Take one by mouth once a day 9)  Vitamin B-12 250 Mg  .... Take one by mouth once a day 10)  Aspirin 81 Mg Tbec (Aspirin) .... Take one by mouth once a day 11)  Toprol Xl 25 Mg Tb24 (Metoprolol succinate) .... Take one by mouth once a day 12)  Simvastatin 80 Mg Tabs (Simvastatin) .... One tab by mouth at night   Patient Instructions: 1)  RTC for Comp Exam, labs prior 4/10 2)  Poss decrease Glyburide next visit.   ]

## 2010-10-25 NOTE — Letter (Signed)
Summary: [name] letter  [facility]  [address] [place], [place] [address]   Phone: [phone]  Fax: [phone]       04/26/2010 MRN: [id number]  Paul Terrell [address] [place], [place]  [address]  Dear Paul [facility] [place] [place] announce the retirement of Arta Silence, M.D., from full-time practice at the [facility] office effective March 22, 2010 and his plans of returning part-time.  It is important to Paul Terrell and to our practice that you understand that [facility] has seven physicians in our office for your health care needs.  We will continue to offer the same exceptional care that you have today.    Paul Terrell has spoken to many of you about his plans for retirement and returning part-time in the fall.   We will continue to work with you through the transition to schedule appointments for you in the office and meet the high standards that [facility] is committed to.   Again, it is with great pleasure that we share the news that Paul Terrell will return to [facility] in October of 2011 with a reduced schedule.    If you have any questions, or would like to request an appointment with one of our physicians, please call us at [phone] and press the option for Scheduling an appointment.  We take pleasure in providing you with excellent patient care and look forward to seeing you at your next office visit.  Our [facility] Physicians are:  Tillman Abide, M.D. Laurita Quint, M.D. Roxy Manns, M.D. Kerby Nora, M.D. Hannah Beat, M.D. Ruthe Mannan, M.D. We proudly welcomed Raechel Ache, M.D. and Eustaquio Boyden, M.D. to the practice in July/August 2011.  Sincerely,  [facility]

## 2010-10-25 NOTE — Progress Notes (Signed)
Summary: regarding actos  Phone Note Call from Patient   Caller: Paul Terrell [phone] Call For: Shaune Leeks MD Summary of Call: Pt had been taking avandia but that was changed to actos.  He has been taking actos, but we didnt refill last time because you said he is not supposed to be taking it anymore.  Pt is almost out and wife is asking if he is supposed to continue or be off of it.  I cant find in his chart where actos was discontinued.  If he is to continue, refills need to be sent to right source. Initial call taken by: Lowella Petties CMA, AAMA,  September 12, 2010 3:01 PM  Follow-up for Phone Call        I do not think with the amt of litigaqtion going on that I canh in good faith continue to prescribe Actos. He should stop and see me in 2-3 mos with diary of his nos with him. Check sugar once a day in a four day progression, first day before brfst, next before lunch, next before dinner, next before bed, next befoire brfst, etc. Follow-up by: Shaune Leeks MD,  September 12, 2010 3:10 PM  Additional Follow-up for Phone Call Additional follow up Details #1::        Advised pt's wife.  She will call back to schedule appt in about 2 and a half months. Additional Follow-up by: Lowella Petties CMA, AAMA,  September 12, 2010 3:18 PM

## 2010-10-25 NOTE — Assessment & Plan Note (Signed)
Summary: CPX / LFW   Vital Signs:  Patient profile:   77 year old male Weight:      179.75 pounds Temp:     98.3 degrees F oral Pulse rate:   72 / minute Pulse rhythm:   regular BP sitting:   118 / 68  (left arm) Cuff size:   regular  Vitals Entered By: Sydell Axon LPN (January 01, 2010 1:57 PM) CC: 30 Minute checkup, hemoccult cards given to patient, has not been taking his Actos   History of Present Illness: Pt here for Comp Exam. He has stopped Avandia 2 mos, and he had had problems getting his Actos from the drug people. he has not started it "Because his numbers are so good."  has catarracts and tneye doctor saif "Flomax was a risk" if he had eye surgery. He has no complaints and feels well.  Preventive Screening-Counseling & Management  Alcohol-Tobacco     Alcohol drinks/day: <1     Alcohol type: wine, rare     Smoking Status: quit     Packs/Day: 1982/40PYH     Year Quit: 1982     Pack years: 78     Cans of tobacco/week: chews rarely     Passive Smoke Exposure: no  Caffeine-Diet-Exercise     Caffeine use/day: 1     Does Patient Exercise: no     Type of exercise: on the go all the time.  Problems Prior to Update: 1)  Hypertrophy Prostate W/ur Obst & Oth Luts  (ICD-600.01) 2)  Special Screening Malig Neoplasms Other Sites  (ICD-V76.49) 3)  Neoplasm, Malignant, Carcinoma, Basal Cell, Recurrent Scalp  (ICD-173.9) 4)  Special Screening Malignant Neoplasm of Prostate  (ICD-V76.44) 5)  Hypertension  (ICD-401.9) 6)  Hyperlipidemia  (ICD-272.4) 7)  Diabetes Mellitus, Type II  (ICD-250.00) 8)  Coronary Artery Disease  (ICD-414.00)  Medications Prior to Update: 1)  Metformin Hcl 1000 Mg Tabs (Metformin Hcl) .... Take One By Mouth Two Times A Day 2)  Flomax 0.4 Mg Cp24 (Tamsulosin Hcl) .... Take One By Mouth Once A Day 3)  Finasteride 5 Mg Tabs (Finasteride) .... One Tab By Mouth Once Daily 4)  Glyburide 5 Mg Tabs (Glyburide) .... Take One By Mouth Every Am 5)   Lisinopril 10 Mg Tabs (Lisinopril) .... Take One By Mouth Once A Day 6)  Actos 30 Mg Tabs (Pioglitazone Hcl) .... One Tab By Mouth Once Daily 7)  Multivitamins   Tabs (Multiple Vitamin) .... Take One By Mouth Once A Day 8)  Vitamin B-12   250 Mg .... Take One By Mouth Once A Day 9)  Aspirin 81 Mg  Tbec (Aspirin) .... Take One By Mouth Once A Day 10)  Toprol Xl 25 Mg  Tb24 (Metoprolol Succinate) .... Take One By Mouth Once A Day 11)  Simvastatin 80 Mg  Tabs (Simvastatin) .... One Tab By Mouth At Night 12)  Onetouch Ultra System W/device Kit (Blood Glucose Monitoring Suppl) .... Use As Directed 13)  Onetouch Ultra Test  Strp (Glucose Blood) .... Patient Test Once Daily Dx:250.00 14)  Onetouch Ultrasoft Lancets  Misc (Lancets) .... Patient Test Once Daily Dx: 250.00  Allergies: No Known Drug Allergies  Past History:  Past Medical History: Last updated: 04/13/2007 Coronary artery disease: stent placed :07/2000 Diabetes mellitus, type II: 07/2000 Hyperlipidemia: 07/2000 Hypertension: 07/2000  Past Surgical History: Last updated: 12/23/2007 RUPTURED DISC IN NECK C7 (DR CLONINGER) LITHOTRIPSY (DR PETERSEN) 1991 R HAND LAC REPAIR (DR KUZMA) 1996 R  KNEE ARTHROSCOPY 09/1999 R BUNIONECTOMY (DR PETRINITZ) 02/2000 ETT POS  CATH STENT 07/2000 STRESS CARDIOLITE WNL EF 65%:(11/11/2002) ROTATOR CUFF REPAIR RIGHT (DR. August Saucer):  (10/2003) CARDIOLITE EF 57% NORMAL:(10/01/2006) REEXCISION SCALP BCC (DR Talmadge Coventry Ollen Bowl) (12/16/2007)  Family History: Last updated: 01-11-10 Father: DECEASED 45 YOA MI  / HTN Mother: DECEASED 33 YOA STROKE( LIVED 2 YEARS ) CAD, ANGIO. PLASTY X 2 / HTN SISTERS A 70  HTN  CERVICAL CANCER (1978) SISTER A 68  HTN 3 BROTHERS 2 DECEASED MI + HTN 1 BROTHER A 78  HTN   CV: + FATHER MI/ +MOTHER CAD/ + 2 BROTHERS MI HBP: + EVERYONE DM: + SELF GOUT/ARTHRITIS: PROSTATE CANCER:  BREAST/OVARIAN/UTERINE CANCER: + SISTER CERVICAL/ OLDEST SISTER COLON/CANCER: DEPRESSION:  NEGATIVE ETOH/DRUG ABUSE : NEGATIVE OTHER: + STROKE MOTHER  Social History: Last updated: 04/13/2007 Marital Status: Married LIVES WITH WIFE Children: 2  Occupation: CARPENTRY  Risk Factors: Alcohol Use: <1 (01-11-10) Caffeine Use: 1 (01-11-10) Exercise: no (01/11/10)  Risk Factors: Smoking Status: quit (2010/01/11) Packs/Day: 1982/40PYH (01/11/2010) Cans of tobacco/wk: chews rarely (11-Jan-2010) Passive Smoke Exposure: no (01-11-2010)  Family History: Father: DECEASED 12 YOA MI  / HTN Mother: DECEASED 62 YOA STROKE( LIVED 2 YEARS ) CAD, ANGIO. PLASTY X 2 / HTN SISTERS A 70  HTN  CERVICAL CANCER (1978) SISTER A 68  HTN 3 BROTHERS 2 DECEASED MI + HTN 1 BROTHER A 78  HTN   CV: + FATHER MI/ +MOTHER CAD/ + 2 BROTHERS MI HBP: + EVERYONE DM: + SELF GOUT/ARTHRITIS: PROSTATE CANCER:  BREAST/OVARIAN/UTERINE CANCER: + SISTER CERVICAL/ OLDEST SISTER COLON/CANCER: DEPRESSION: NEGATIVE ETOH/DRUG ABUSE : NEGATIVE OTHER: + STROKE MOTHER  Social History: Caffeine use/day:  1  Review of Systems General:  Denies chills, fatigue, fever, sweats, weakness, and weight loss. Eyes:  Denies blurring, discharge, and eye pain; neew catarracts. ENT:  Complains of decreased hearing and ringing in ears; denies earache. CV:  Complains of shortness of breath with exertion; denies chest pain or discomfort, fainting, fatigue, palpitations, swelling of feet, and swelling of hands. Resp:  Denies cough, shortness of breath, and wheezing. GI:  Denies abdominal pain, bloody stools, change in bowel habits, constipation, dark tarry stools, diarrhea, indigestion, loss of appetite, nausea, vomiting, vomiting blood, and yellowish skin color. GU:  Complains of urinary hesitancy; denies discharge, dysuria, nocturia, and urinary frequency. MS:  Complains of stiffness; denies joint pain, low back pain, muscle aches, and cramps; mild in AM. Derm:  Complains of dryness; denies itching and rash; in  winter. Neuro:  Denies numbness, poor balance, tingling, and tremors.  Physical Exam  General:  Well-developed,well-nourished,in no acute distress; alert,appropriate and cooperative throughout examination. Head:  Normocephalic and atraumatic without obvious abnormalities. No apparent alopecia or balding, mild thinning. SinusesNT. Eyes:  Conjunctiva clear bilaterally.  Ears:  External ear exam shows no significant lesions or deformities.  Otoscopic examination reveals clear canals, tympanic membranes are intact bilaterally without bulging, retraction, inflammation or discharge. Hearing poor  bilaterally, even with hearing aids. Nose:  External nasal examination shows no deformity or inflammation. Nasal mucosa are pink and moist without lesions or exudates. Deviated right. Mouth:  Oral mucosa and oropharynx without lesions or exudates.  Teeth in good repair. Very dry mucosa. Neck:  No deformities, masses, or tenderness noted. Chest Wall:  No deformities, masses, tenderness or gynecomastia noted. Lungs:  Normal respiratory effort, chest expands symmetrically. Lungs are clear to auscultation, no crackles or wheezes. Heart:  Normal rate and regular rhythm. S1 and S2 normal without  gallop, murmur, click, rub or other extra sounds. Abdomen:  Bowel sounds positive,abdomen soft and non-tender without masses, organomegaly or hernias noted. Rectal:  No external abnormalities noted. Normal sphincter tone. No rectal masses or tenderness. G neg. Genitalia:  Testes bilaterally descended without nodularity, tenderness or masses. No scrotal masses or lesions. No penis lesions or urethral discharge. Prostate:  Prostate gland firm and smooth, no enlargement, nodularity, tenderness, mass or induration. 20-30gms. Minimally larger R side vs L. Msk:  No deformity or scoliosis noted of thoracic or lumbar spine.   Pulses:  R and L carotid,radial,femoral,dorsalis pedis and posterior tibial pulses are full and equal  bilaterally Extremities:  No clubbing, cyanosis, edema, or deformity noted with normal full range of motion of all joints.  No swelling at all today. Neurologic:  No cranial nerve deficits noted. Station and gait are normal. Sensory, motor and coordinative functions appear intact.  Diabetes Management Exam:    Foot Exam (with socks and/or shoes not present):       Sensory-Pinprick/Light touch:          Left medial foot (L-4): normal          Left dorsal foot (L-5): normal          Left lateral foot (S-1): normal          Right medial foot (L-4): normal          Right dorsal foot (L-5): normal          Right lateral foot (S-1): normal       Sensory-Monofilament:          Left foot: diminished          Right foot: diminished       Inspection:          Left foot: normal          Right foot: normal       Nails:          Left foot: thickened          Right foot: thickened   Impression & Recommendations:  Problem # 1:  HYPERTROPHY PROSTATE W/UR OBST & OTH LUTS (ICD-600.01) Assessment Unchanged Unchanged, stable.  Problem # 2:  HYPERTENSION (ICD-401.9) Assessment: Unchanged Stable, cont meds. His updated medication list for this problem includes:    Lisinopril 10 Mg Tabs (Lisinopril) .Marland Kitchen... Take one by mouth once a day    Toprol Xl 25 Mg Tb24 (Metoprolol succinate) .Marland Kitchen... Take one by mouth once a day  BP today: 118/68 Prior BP: 122/60 (06/29/2009)  Prior 10 Yr Risk Heart Disease: N/A (12/28/2008)  Labs Reviewed: K+: 4.3 (12/27/2009) Creat: : 0.8 (12/27/2009)   Chol: 92 (12/27/2009)   HDL: 44.20 (12/27/2009)   LDL: 37 (12/27/2009)   TG: 53.0 (12/27/2009)  Problem # 3:  HYPERLIPIDEMIA (ICD-272.4) Assessment: Unchanged Good nos, cont curr meds. His updated medication list for this problem includes:    Simvastatin 80 Mg Tabs (Simvastatin) ..... One tab by mouth at night  Labs Reviewed: SGOT: 32 (12/27/2009)   SGPT: 31 (12/27/2009)  Prior 10 Yr Risk Heart Disease: N/A  (12/28/2008)   HDL:44.20 (12/27/2009), 44.90 (12/26/2008)  LDL:37 (12/27/2009), 56 (12/26/2008)  Chol:92 (12/27/2009), 121 (12/26/2008)  Trig:53.0 (12/27/2009), 99.0 (12/26/2008)  Problem # 4:  DIABETES MELLITUS, TYPE II (ICD-250.00) Assessment: Unchanged Good at present but want him to start on Actos. He still see the effectof the Avandia in his system and we will lose his contriol when that finally wears off. The  following medications were removed from the medication list:    Actos 30 Mg Tabs (Pioglitazone hcl) ..... One tab by mouth once daily His updated medication list for this problem includes:    Metformin Hcl 1000 Mg Tabs (Metformin hcl) .Marland Kitchen... Take one by mouth two times a day    Glyburide 5 Mg Tabs (Glyburide) .Marland Kitchen... Take one by mouth every am    Lisinopril 10 Mg Tabs (Lisinopril) .Marland Kitchen... Take one by mouth once a day    Aspirin 81 Mg Tbec (Aspirin) .Marland Kitchen... Take one by mouth once a day  Labs Reviewed: Creat: 0.8 (12/27/2009)   Microalbumin: 3.3 (11/26/2005)  Last Eye Exam: normal (12/22/2009) Reviewed HgBA1c results: 6.0 (12/27/2009)  6.0 (06/26/2009)  Complete Medication List: 1)  Metformin Hcl 1000 Mg Tabs (Metformin hcl) .... Take one by mouth two times a day 2)  Flomax 0.4 Mg Cp24 (Tamsulosin hcl) .... Take one by mouth once a day 3)  Finasteride 5 Mg Tabs (Finasteride) .... One tab by mouth once daily 4)  Glyburide 5 Mg Tabs (Glyburide) .... Take one by mouth every am 5)  Lisinopril 10 Mg Tabs (Lisinopril) .... Take one by mouth once a day 6)  Multivitamins Tabs (Multiple vitamin) .... Take one by mouth once a day 7)  Vitamin B-12 250 Mg  .... Take one by mouth once a day 8)  Aspirin 81 Mg Tbec (Aspirin) .... Take one by mouth once a day 9)  Toprol Xl 25 Mg Tb24 (Metoprolol succinate) .... Take one by mouth once a day 10)  Simvastatin 80 Mg Tabs (Simvastatin) .... One tab by mouth at night 11)  Onetouch Ultra System W/device Kit (Blood glucose monitoring suppl) .... Use as  directed 12)  Onetouch Ultra Test Strp (Glucose blood) .... Patient test once daily dx:250.00 13)  Onetouch Ultrasoft Lancets Misc (Lancets) .... Patient test once daily dx: 250.00  Patient Instructions: 1)  Start Actos. 2)  SGOT SGPT 6 weeks and SGOT SGPT BMet and A1C 3mos, Recheck then.   Current Allergies (reviewed today): No known allergies

## 2010-10-25 NOTE — Letter (Signed)
Summary: Results Follow up Letter  [place] [facility]  [address] [place], [place] [address]   Phone: [phone]  Fax: [phone]    02/12/2008 MRN: [id number]  Paul Terrell [address] [place], [place]  [address]  Dear [name],  The following are the results of your recent test(s):  Test         Result    Pap Smear:        Normal _____  Not Normal _____ Comments: ______________________________________________________ Cholesterol: LDL(Bad cholesterol):         Your goal is less than:         HDL (Good cholesterol):       Your goal is more than: Comments:  ______________________________________________________ Mammogram:        Normal _____  Not Normal _____ Comments:  ___________________________________________________________________ Hemoccult:        Normal _x____  Not normal _______ Comments:  NO BLOOD IN STOOL. THANK YOU FOR RETURNING THE HEMOCCULT CARDS. PLEASE MAKE SURE TO REPEAT IN ONE YEAR.  _____________________________________________________________________ Other Tests:    We routinely do not discuss normal results over the telephone.  If you desire a copy of the results, or you have any questions about this information we can discuss them at your next office visit.   Sincerely,

## 2010-10-25 NOTE — Assessment & Plan Note (Signed)
Summary: MED REFILL/DLO   Vital Signs:  Patient profile:   77 year old male Height:      67 inches Weight:      185.25 pounds BMI:     29.12 Temp:     96.8 degrees F oral Pulse rate:   76 / minute Pulse rhythm:   regular BP sitting:   140 / 70  (left arm) Cuff size:   regular  Vitals Entered By: Linde Gillis CMA Duncan Dull) (August 22, 2010 10:12 AM) CC: medication refill   History of Present Illness: Pt here for He feels well. His sugar runs 100-120, rarely down into the 80s.  His BPO is u[p slightly today but much better than last time.  He is urinatoing ok.  His legs and feet hurt him. His wife got him some support hose. He thinks they help and wanted to know if there was a problem wearing them.  Problems Prior to Update: 1)  Hypertrophy Prostate W/ur Obst & Oth Luts  (ICD-600.01) 2)  Special Screening Malig Neoplasms Other Sites  (ICD-V76.49) 3)  Neoplasm, Malignant, Carcinoma, Basal Cell, Recurrent Scalp  (ICD-173.9) 4)  Special Screening Malignant Neoplasm of Prostate  (ICD-V76.44) 5)  Hypertension  (ICD-401.9) 6)  Hyperlipidemia  (ICD-272.4) 7)  Diabetes Mellitus, Type II  (ICD-250.00) 8)  Coronary Artery Disease  (ICD-414.00)  Medications Prior to Update: 1)  Metformin Hcl 1000 Mg Tabs (Metformin Hcl) .... Take One By Mouth Two Times A Day 2)  Flomax 0.4 Mg Cp24 (Tamsulosin Hcl) .... Take One By Mouth Once A Day 3)  Finasteride 5 Mg Tabs (Finasteride) .... One Tab By Mouth Once Daily 4)  Glyburide 5 Mg Tabs (Glyburide) .... Take One By Mouth Every Am 5)  Lisinopril 10 Mg Tabs (Lisinopril) .... Take One By Mouth Once A Day 6)  Multivitamins   Tabs (Multiple Vitamin) .... Take One By Mouth Once A Day 7)  Vitamin B-12   250 Mg .... Take One By Mouth Once A Day 8)  Aspirin 81 Mg  Tbec (Aspirin) .... Take One By Mouth Once A Day 9)  Toprol Xl 25 Mg  Tb24 (Metoprolol Succinate) .... Take One By Mouth Once A Day 10)  Zocor 40 Mg Tabs (Simvastatin) 11)  Onetouch Ultra  System W/device Kit (Blood Glucose Monitoring Suppl) .... Use As Directed 12)  Onetouch Ultra Test  Strp (Glucose Blood) .... Patient Test Once Daily Dx:250.00 13)  Onetouch Ultrasoft Lancets  Misc (Lancets) .... Patient Test Once Daily Dx: 250.00  Current Medications (verified): 1)  Metformin Hcl 1000 Mg Tabs (Metformin Hcl) .... Take One By Mouth Two Times A Day 2)  Flomax 0.4 Mg Cp24 (Tamsulosin Hcl) .... Take One By Mouth Once A Day 3)  Finasteride 5 Mg Tabs (Finasteride) .... One Tab By Mouth Once Daily 4)  Glyburide 5 Mg Tabs (Glyburide) .... Take One By Mouth Every Am 5)  Lisinopril 10 Mg Tabs (Lisinopril) .... Take One By Mouth Once A Day 6)  Multivitamins   Tabs (Multiple Vitamin) .... Take One By Mouth Once A Day 7)  Vitamin B-12   250 Mg .... Take One By Mouth Once A Day 8)  Aspirin 81 Mg  Tbec (Aspirin) .... Take One By Mouth Once A Day 9)  Toprol Xl 25 Mg  Tb24 (Metoprolol Succinate) .... Take One By Mouth Once A Day 10)  Zocor 40 Mg Tabs (Simvastatin) 11)  Onetouch Ultra System W/device Kit (Blood Glucose Monitoring Suppl) .... Use As Directed  12)  Onetouch Ultra Test  Strp (Glucose Blood) .... Patient Test Once Daily Dx:250.00 13)  Onetouch Ultrasoft Lancets  Misc (Lancets) .... Patient Test Once Daily Dx: 250.00  Allergies (verified): No Known Drug Allergies  Physical Exam  General:  Well-developed,well-nourished,in no acute distress; alert,appropriate and cooperative throughout examination Head:  Normocephalic and atraumatic without obvious abnormalities. No apparent alopecia or balding. Sinuses NT. Eyes:  Conjunctiva clear bilaterally.  Ears:  External ear exam shows no significant lesions or deformities.  Otoscopic examination reveals clear canals, tympanic membranes are intact bilaterally without bulging, retraction, inflammation or discharge. Hearing is mildly decreased  bilaterally. H/As in place bilat. Nose:  External nasal examination shows no deformity or  inflammation. Nasal mucosa are pink and moist without lesions or exudates. Mouth:  Oral mucosa and oropharynx without lesions or exudates.  Teeth in good repair. Very dry mucosa. Neck:  No deformities, masses, or tenderness noted. Lungs:  Normal respiratory effort, chest expands symmetrically. Lungs are clear to auscultation, no crackles or wheezes. Heart:  Normal rate and regular rhythm. S1 and S2 normal without gallop, murmur, click, rub or other extra sounds.  Diabetes Management Exam:    Foot Exam (with socks and/or shoes not present):       Sensory-Pinprick/Light touch:          Left medial foot (L-4): normal          Left dorsal foot (L-5): normal          Left lateral foot (S-1): normal          Right medial foot (L-4): normal          Right dorsal foot (L-5): normal          Right lateral foot (S-1): normal       Sensory-Monofilament:          Left foot: diminished          Right foot: diminished       Inspection:          Left foot: normal          Right foot: normal       Nails:          Left foot: normal          Right foot: normal   Impression & Recommendations:  Problem # 1:  HYPERTENSION (ICD-401.9) Assessment Improved Better but still mildly elevated for DM. Wiull adjust next time if not lower. His updated medication list for this problem includes:    Lisinopril 10 Mg Tabs (Lisinopril) .Marland Kitchen... Take one by mouth once a day    Toprol Xl 25 Mg Tb24 (Metoprolol succinate) .Marland Kitchen... Take one by mouth once a day  BP today: 140/70 Prior BP: 150/80 (04/04/2010)  Prior 10 Yr Risk Heart Disease: N/A (12/28/2008)  Labs Reviewed: K+: 4.9 (04/02/2010) Creat: : 0.9 (04/02/2010)   Chol: 92 (12/27/2009)   HDL: 44.20 (12/27/2009)   LDL: 37 (12/27/2009)   TG: 53.0 (12/27/2009)  Problem # 2:  DIABETES MELLITUS, TYPE II (ICD-250.00) Assessment: Unchanged Stable. Encouraged to cont as is. Actos should now be out of hids system. His updated medication list for this problem  includes:    Metformin Hcl 1000 Mg Tabs (Metformin hcl) .Marland Kitchen... Take one by mouth two times a day    Glyburide 5 Mg Tabs (Glyburide) .Marland Kitchen... Take one by mouth every am    Lisinopril 10 Mg Tabs (Lisinopril) .Marland Kitchen... Take one by mouth once a day  Aspirin 81 Mg Tbec (Aspirin) .Marland Kitchen... Take one by mouth once a day  Labs Reviewed: Creat: 0.9 (04/02/2010)   Microalbumin: 3.3 (11/26/2005)  Last Eye Exam: normal (12/22/2009) Reviewed HgBA1c results: 6.1 (04/02/2010)  6.0 (12/27/2009)  Problem # 3:  HYPERTROPHY PROSTATE W/UR OBST & OTH LUTS (ICD-600.01) Assessment: Unchanged Acceptable.  Complete Medication List: 1)  Metformin Hcl 1000 Mg Tabs (Metformin hcl) .... Take one by mouth two times a day 2)  Flomax 0.4 Mg Cp24 (Tamsulosin hcl) .... Take one by mouth once a day 3)  Finasteride 5 Mg Tabs (Finasteride) .... One tab by mouth once daily 4)  Glyburide 5 Mg Tabs (Glyburide) .... Take one by mouth every am 5)  Lisinopril 10 Mg Tabs (Lisinopril) .... Take one by mouth once a day 6)  Multivitamins Tabs (Multiple vitamin) .... Take one by mouth once a day 7)  Vitamin B-12 250 Mg  .... Take one by mouth once a day 8)  Aspirin 81 Mg Tbec (Aspirin) .... Take one by mouth once a day 9)  Toprol Xl 25 Mg Tb24 (Metoprolol succinate) .... Take one by mouth once a day 10)  Zocor 40 Mg Tabs (Simvastatin) 11)  Onetouch Ultra System W/device Kit (Blood glucose monitoring suppl) .... Use as directed 12)  Onetouch Ultra Test Strp (Glucose blood) .... Patient test once daily dx:250.00 13)  Onetouch Ultrasoft Lancets Misc (Lancets) .... Patient test once daily dx: 250.00  Patient Instructions: 1)  RTC 412 for Comp Exam, labs prior.   Orders Added: 1)  Est. Patient Level III [[address]]    Current Allergies (reviewed today): No known allergies

## 2010-10-25 NOTE — Progress Notes (Signed)
Summary: wants to change to generics  Phone Note Call from Patient Call back at Home Phone [phone]   Caller: Patient Call For: Shaune Leeks MD Summary of Call: Pt wants to change from avodart to finasteride and form avandia to acarbosa (?).  He wants 90 day scripts for [employer] order.  Please call when ready. Initial call taken by: Lowella Petties CMA,  April 11, 2009 8:52 AM  Follow-up for Phone Call        I need to see him to discuss these changes he desires, ?tomm? Follow-up by: Shaune Leeks MD,  April 11, 2009 10:03 AM  Additional Follow-up for Phone Call Additional follow up Details #1::        Lupita Leash will call pt to schedule,they are friends. Additional Follow-up by: Lowella Petties CMA,  April 11, 2009 10:06 AM

## 2010-10-25 NOTE — Letter (Signed)
Summary: Results Follow up Letter  [place] [facility]  [address] [place], [place] [address]   Phone: [phone]  Fax: [phone]    02/05/2010 MRN: [id number]  Paul Terrell [address] [place], [place]  [address]  Dear [name],  The following are the results of your recent test(s):  Test         Result    Pap Smear:        Normal _____  Not Normal _____ Comments: ______________________________________________________ Cholesterol: LDL(Bad cholesterol):         Your goal is less than:         HDL (Good cholesterol):       Your goal is more than: Comments:  ______________________________________________________ Mammogram:        Normal _____  Not Normal _____ Comments:  ___________________________________________________________________ Hemoccult:        Normal __X___  Not normal _______ Comments:  Please repeat in one year.  _____________________________________________________________________ Other Tests:    We routinely do not discuss normal results over the telephone.  If you desire a copy of the results, or you have any questions about this information we can discuss them at your next office visit.   Sincerely,    Laurita Quint, MD

## 2010-10-25 NOTE — Assessment & Plan Note (Signed)
Summary: DIZZY/DLO   Vital Signs:  Patient profile:   77 year old male Height:      67 inches Weight:      181 pounds BMI:     28.45 Temp:     97.3 degrees F oral Pulse rate:   76 / minute Pulse rhythm:   regular BP sitting:   120 / 54  (left arm)  Vitals Entered By: Providence Crosby (Jan 31, 2009 12:25 PM) CC: started Friday severe dizziness// some diarrhea// aching all over // started taking new generic flomax// wonders if this did this??   History of Present Illness: Pt here for starting to hurt all over last Friday with achiness and dizziness and won't resolve. He is not quite as bad as he was Friday today. He has no known exposure. His dizziness is worst with getting up and rollin over in bed.  He started generic for Flomax a few weeks ago. He is also having muscle pain that started since Friday.  He is having headache, no ear pain, having rhinitis that is clear, no ST, mild cough worse in the last few days productive of minimally yellow sputum. He is mildly SOB (he thinks from 74 yoa!), no N/V.  He is dizzy with position changes, no fever but has chills. He has taken ASA or Tyl with good effect.  Problems Prior to Update: 1)  Special Screening Malig Neoplasms Other Sites  (ICD-V76.49) 2)  Neoplasm, Malignant, Carcinoma, Basal Cell, Recurrent Scalp  (ICD-173.9) 3)  Special Screening Malignant Neoplasm of Prostate  (ICD-V76.44) 4)  Hypertension  (ICD-401.9) 5)  Hyperlipidemia  (ICD-272.4) 6)  Diabetes Mellitus, Type II  (ICD-250.00) 7)  Coronary Artery Disease  (ICD-414.00)  Medications Prior to Update: 1)  Metformin Hcl 1000 Mg Tabs (Metformin Hcl) .... Take One By Mouth Two Times A Day 2)  Flomax 0.4 Mg Cp24 (Tamsulosin Hcl) .... Take One By Mouth Once A Day 3)  Avodart 0.5 Mg Caps (Dutasteride) .... Take One By Mouth Once A Day 4)  Glyburide 5 Mg Tabs (Glyburide) .... Take One By Mouth Every Am 5)  Lisinopril 10 Mg Tabs (Lisinopril) .... Take One By Mouth Once A Day 6)   Avandia 8 Mg  Tabs (Rosiglitazone Maleate) .... Take One By Mouth Once A Day 7)  Multivitamins   Tabs (Multiple Vitamin) .... Take One By Mouth Once A Day 8)  Vitamin B-12   250 Mg .... Take One By Mouth Once A Day 9)  Aspirin 81 Mg  Tbec (Aspirin) .... Take One By Mouth Once A Day 10)  Toprol Xl 25 Mg  Tb24 (Metoprolol Succinate) .... Take One By Mouth Once A Day 11)  Simvastatin 80 Mg  Tabs (Simvastatin) .... One Tab By Mouth At Night  Allergies (verified): No Known Drug Allergies  Physical Exam  General:  Well-developed,well-nourished,in no acute distress; alert,appropriate and cooperative throughout examination, nontoxic but with chills when seen. Head:  Normocephalic and atraumatic without obvious abnormalities. No apparent alopecia or balding, mild thinning. Eyes:  Conjunctiva clear bilaterally.  Ears:  External ear exam shows no significant lesions or deformities.  Otoscopic examination reveals clear canals, tympanic membranes are intact bilaterally without bulging, retraction, inflammation or discharge. Hearing is grossly normal bilaterally. H/As bilat. Nose:  External nasal examination shows no deformity or inflammation. Nasal mucosa are pink and moist without lesions or exudates. Deviated right. Mouth:  Oral mucosa and oropharynx without lesions or exudates.  Teeth in good repair. Very dry mucosa. Neck:  No deformities, masses, or tenderness noted. Chest Wall:  No deformities, masses, tenderness or gynecomastia noted. Lungs:  Normal respiratory effort, chest expands symmetrically. Lungs are clear to auscultation, no crackles or wheezes. Heart:  Normal rate and regular rhythm. S1 and S2 normal without gallop, murmur, click, rub or other extra sounds. Abdomen:  Bowel sounds positive,abdomen soft and non-tender without masses, organomegaly or hernias noted.   Impression & Recommendations:  Problem # 1:  URI (ICD-465.9) Assessment New  Viral Syndrome. See instructions. His  updated medication list for this problem includes:    Aspirin 81 Mg Tbec (Aspirin) .Marland Kitchen... Take one by mouth once a day  Instructed on symptomatic treatment. Call if symptoms persist or worsen.   Complete Medication List: 1)  Metformin Hcl 1000 Mg Tabs (Metformin hcl) .... Take one by mouth two times a day 2)  Flomax 0.4 Mg Cp24 (Tamsulosin hcl) .... Take one by mouth once a day 3)  Avodart 0.5 Mg Caps (Dutasteride) .... Take one by mouth once a day 4)  Glyburide 5 Mg Tabs (Glyburide) .... Take one by mouth every am 5)  Lisinopril 10 Mg Tabs (Lisinopril) .... Take one by mouth once a day 6)  Avandia 8 Mg Tabs (Rosiglitazone maleate) .... Take one by mouth once a day 7)  Multivitamins Tabs (Multiple vitamin) .... Take one by mouth once a day 8)  Vitamin B-12 250 Mg  .... Take one by mouth once a day 9)  Aspirin 81 Mg Tbec (Aspirin) .... Take one by mouth once a day 10)  Toprol Xl 25 Mg Tb24 (Metoprolol succinate) .... Take one by mouth once a day 11)  Simvastatin 80 Mg Tabs (Simvastatin) .... One tab by mouth at night  Patient Instructions: 1)  Take tyl ES 2 4 times a day regularly for one week. 2)  Stay on clear liquids for 24 hours but increase liquid intake. 3)  Return if sxs don't improve.

## 2010-10-25 NOTE — Miscellaneous (Signed)
  Clinical Lists Changes  Medications: Changed medication from METFORMIN HCL 1000 MG TABS (METFORMIN HCL) to METFORMIN HCL 1000 MG TABS (METFORMIN HCL) Take one by mouth two times a day Changed medication from FLOMAX 0.4 MG CP24 (TAMSULOSIN HCL) to FLOMAX 0.4 MG CP24 (TAMSULOSIN HCL) Take one by mouth once a day Changed medication from ZETIA 10 MG TABS (EZETIMIBE) to ZETIA 10 MG TABS (EZETIMIBE) Take one by mouth once a day Changed medication from AVODART 0.5 MG CAPS (DUTASTERIDE) to AVODART 0.5 MG CAPS (DUTASTERIDE) Take one by mouth once a day Changed medication from GLYBURIDE 5 MG TABS (GLYBURIDE) to GLYBURIDE 5 MG TABS (GLYBURIDE) Take one by mouth every am Changed medication from LISINOPRIL 10 MG TABS (LISINOPRIL) to LISINOPRIL 10 MG TABS (LISINOPRIL) Take one by mouth once a day Removed medication of LIPITOR 10 MG TABS (ATORVASTATIN CALCIUM) Added new medication of AVANDIA 8 MG  TABS (ROSIGLITAZONE MALEATE) Take one by mouth once a day Added new medication of MULTIVITAMINS   TABS (MULTIPLE VITAMIN) Take one by mouth once a day Added new medication of * VITAMIN B-12   250 MG Take one by mouth once a day Added new medication of ASPIRIN 81 MG  TBEC (ASPIRIN) Take one by mouth once a day Added new medication of TOPROL XL 25 MG  TB24 (METOPROLOL SUCCINATE) Take one by mouth once a day Added new medication of PRAVACHOL 80 MG  TABS (PRAVASTATIN SODIUM) Take one by mouth at bedtime

## 2010-10-25 NOTE — Progress Notes (Signed)
Summary: refill request for actos  Phone Note Refill Request Message from:  Fax from Pharmacy  Refills Requested: Medication #1:  actos 30 mg  take one a day [facility]ed [facility], this is not on med list.  Initial call taken by: Lowella Petties CMA, AAMA,  August 30, 2010 5:01 PM  Follow-up for Phone Call        He is not supposed to be on Actos anymore. Follow-up by: Shaune Leeks MD,  August 30, 2010 5:03 PM  Additional Follow-up for Phone Call Additional follow up Details #1::        [facility]axed [facility] to [facility]. Additional Follow-up by: Lowella Petties CMA, AAMA,  August 30, 2010 5:16 PM

## 2010-10-25 NOTE — Letter (Signed)
Summary: Dilated Fundus Examination,Dr.Christine McCuen,[facility]  Dilated Fundus Examination,Dr.Christine McCuen,[facility]   Imported By: Beau Fanny 12/25/2009 14:56:45  _____________________________________________________________________  External Attachment:    Type:   Image     Comment:   External Document  Appended Document: Dilated Fundus Examination,Dr.Christine McCuen,[facility]    Clinical Lists Changes  Observations: Added new observation of DMEYEEXAMNXT: 12/2010 (12/25/2009 16:55) Added new observation of DMEYEEXMRES: normal (12/22/2009 16:56) Added new observation of EYE EXAM BY: Dr Charlotte Sanes (12/22/2009 16:56) Added new observation of DIAB EYE EX: normal (12/22/2009 16:56)        Diabetes Management Exam:    Eye Exam:       Eye Exam done elsewhere          Date: 12/22/2009          Results: normal          Done by: Dr Charlotte Sanes

## 2010-10-25 NOTE — Letter (Signed)
Summary: [facility] / EYE EXAM RPT  [facility] / EYE EXAM RPT   Imported By: Carin Primrose 04/05/2009 14:37:28  _____________________________________________________________________  External Attachment:    Type:   Image     Comment:   External Document  Appended Document: [facility] / EYE EXAM RPT    Clinical Lists Changes  Observations: Added new observation of DMEYEEXAMNXT: 03/2010 (04/05/2009 15:43) Added new observation of DMEYEEXMRES: normal (04/03/2009 15:43) Added new observation of EYE EXAM BY: Dr Charlotte Sanes (04/03/2009 15:43) Added new observation of DIAB EYE EX: normal (04/03/2009 15:43)        Diabetes Management Exam:    Eye Exam:       Eye Exam done elsewhere          Date: 04/03/2009          Results: normal          Done by: Dr Charlotte Sanes

## 2010-10-25 NOTE — Assessment & Plan Note (Signed)
Summary: CHECK UP/CLE   Vital Signs:  Patient Profile:   77 Years Old Male Height:     67 inches Weight:      187 pounds Temp:     97.7 degrees F oral Pulse rate:   64 / minute Pulse rhythm:   regular BP sitting:   130 / 60  (left arm) Cuff size:   regular  Vitals Entered By: Providence Crosby (December 25, 2007 11:00 AM)                 Chief Complaint:  CHECK UP// HEMOCCULT CARDS TO PATIENT.  History of Present Illness: Here for Comp Exam, had sinus infection and took what he was told last year and it resolved. No complaints today. Insurance will not cover Zetia so will try incresing to Simvastatin.] Sugars have been 100-130 typically.    Prior Medications Reviewed Using: List Brought by Patient  Current Allergies: No known allergies     Risk Factors:  Passive smoke exposure:  no Drug use:  no HIV high-risk behavior:  no Caffeine use:  3 drinks per day Alcohol use:  no Exercise:  no Seatbelt use:  100 %   Review of Systems  Eyes      Denies blurring, discharge, double vision, eye irritation, eye pain, halos, itching, light sensitivity, red eye, vision loss-1 eye, and vision loss-both eyes.  ENT      Complains of decreased hearing.      significant, longstanding  CV      Denies bluish discoloration of lips or nails, chest pain or discomfort, difficulty breathing at night, difficulty breathing while lying down, fainting, fatigue, leg cramps with exertion, lightheadness, near fainting, palpitations, shortness of breath with exertion, swelling of feet, swelling of hands, and weight gain.  Resp      Denies chest discomfort, chest pain with inspiration, cough, coughing up blood, excessive snoring, hypersomnolence, morning headaches, pleuritic, shortness of breath, sputum productive, and wheezing.  GI      Denies abdominal pain, bloody stools, change in bowel habits, constipation, dark tarry stools, diarrhea, excessive appetite, gas, hemorrhoids, indigestion, loss  of appetite, nausea, vomiting, vomiting blood, and yellowish skin color.  GU      Denies decreased libido, discharge, dysuria, erectile dysfunction, genital sores, hematuria, incontinence, nocturia, urinary frequency, and urinary hesitancy.  MS      Denies joint pain, joint redness, joint swelling, loss of strength, low back pain, mid back pain, muscle aches, muscle , cramps, muscle weakness, stiffness, and thoracic pain.  Derm      recent lesion excised from back of scalp  Neuro      Denies brief paralysis, difficulty with concentration, disturbances in coordination, falling down, headaches, inability to speak, memory loss, numbness, poor balance, seizures, sensation of room spinning, tingling, tremors, visual disturbances, and weakness.   Physical Exam  General:     Well-developed,well-nourished,in no acute distress; alert,appropriate and cooperative throughout examination Head:     Normocephalic and atraumatic without obvious abnormalities. No apparent alopecia or balding. Eyes:     Conjunctiva clear bilaterally.  Ears:     External ear exam shows no significant lesions or deformities.  Otoscopic examination reveals clear canals, tympanic membranes are intact bilaterally without bulging, retraction, inflammation or discharge. Hearing is grossly normal bilaterally. Nose:     External nasal examination shows no deformity or inflammation. Nasal mucosa are pink and moist without lesions or exudates. Mouth:     Oral mucosa and oropharynx without lesions or  exudates.  Teeth in good repair. Neck:     No deformities, masses, or tenderness noted. Chest Wall:     No deformities, masses, tenderness or gynecomastia noted. Breasts:     No masses or gynecomastia noted Lungs:     Normal respiratory effort, chest expands symmetrically. Lungs are clear to auscultation, no crackles or wheezes. Heart:     Normal rate and regular rhythm. S1 and S2 normal without gallop, murmur, click, rub or  other extra sounds. Abdomen:     Bowel sounds positive,abdomen soft and non-tender without masses, organomegaly or hernias noted. Rectal:     No external abnormalities noted. Normal sphincter tone. No rectal masses or tenderness. G neg. Genitalia:     Testes bilaterally descended without nodularity, tenderness or masses. No scrotal masses or lesions. No penis lesions or urethral discharge. Prostate:     Prostate gland firm and smooth, no enlargement, nodularity, tenderness, mass, asymmetry or induration. 20-30gms. Msk:     No deformity or scoliosis noted of thoracic or lumbar spine.   Pulses:     R and L carotid,radial,femoral,dorsalis pedis and posterior tibial pulses are full and equal bilaterally Extremities:     No clubbing, cyanosis, edema, or deformity noted with normal full range of motion of all joints.   Neurologic:     No cranial nerve deficits noted. Station and gait are normal. Plantar reflexes are down-going bilaterally. DTRs are symmetrical throughout. Sensory, motor and coordinative functions appear intact. Skin:     Intact without suspicious lesions or rashes Cervical Nodes:     No lymphadenopathy noted Inguinal Nodes:     No significant adenopathy Psych:     Cognition and judgment appear intact. Alert and cooperative with normal attention span and concentration. No apparent delusions, illusions, hallucinations  Diabetes Management Exam:    Foot Exam (with socks and/or shoes not present):       Sensory-Monofilament:          Left foot: normal          Right foot: normal       Inspection:          Left foot: normal          Right foot: normal       Nails:          Left foot: thickened          Right foot: thickened    Eye Exam:       Eye Exam done elsewhere          Date: 01/06/2007          Results: normal          Done by: Dr Chilton Si    Impression & Recommendations:  Problem # 1:  NEOPLASM, MALIGNANT, CARCINOMA, BASAL CELL, RECURRENT SCALP (ICD-173.9)  Assessment: Improved Excised and reexcised.  Problem # 2:  SPECIAL SCREENING MALIGNANT NEOPLASM OF PROSTATE (ICD-V76.44) Assessment: Unchanged Stable.  Problem # 3:  HYPERTENSION (ICD-401.9) Assessment: Unchanged Stable. His updated medication list for this problem includes:    Lisinopril 10 Mg Tabs (Lisinopril) .Marland Kitchen... Take one by mouth once a day    Toprol Xl 25 Mg Tb24 (Metoprolol succinate) .Marland Kitchen... Take one by mouth once a day  BP today: 130/60 Prior BP: 128/68 (06/29/2007)  Labs Reviewed: Creat: 0.9 (12/21/2007) Chol: 123 (12/21/2007)   HDL: 42.8 (12/21/2007)   LDL: 60 (12/21/2007)   TG: 103 (12/21/2007)   Problem # 4:  HYPERLIPIDEMIA (ICD-272.4) Gfreat control but insurance  won't cover Zetia...will try 80mg  Simvastatin. His updated medication list for this problem includes:    Zetia 10 Mg Tabs (Ezetimibe) .Marland Kitchen... Take one by mouth once a day    Simvastatin 80 Mg Tabs (Simvastatin) ..... One tab by mouth at night  Labs Reviewed: Chol: 123 (12/21/2007)   HDL: 42.8 (12/21/2007)   LDL: 60 (12/21/2007)   TG: 103 (12/21/2007) SGOT: 26 (12/21/2007)   SGPT: 27 (12/21/2007)   Problem # 5:  DIABETES MELLITUS, TYPE II (ICD-250.00) Assessment: Improved Great control.  Encouraged to get Eye exam and cont to check feety daily. His updated medication list for this problem includes:    Metformin Hcl 1000 Mg Tabs (Metformin hcl) .Marland Kitchen... Take one by mouth two times a day    Glyburide 5 Mg Tabs (Glyburide) .Marland Kitchen... Take one by mouth every am    Lisinopril 10 Mg Tabs (Lisinopril) .Marland Kitchen... Take one by mouth once a day    Avandia 8 Mg Tabs (Rosiglitazone maleate) .Marland Kitchen... Take one by mouth once a day    Aspirin 81 Mg Tbec (Aspirin) .Marland Kitchen... Take one by mouth once a day  Labs Reviewed: HgBA1c: 5.8 (12/21/2007)   Creat: 0.9 (12/21/2007)   Microalbumin: 3.3 (11/26/2005)   Complete Medication List: 1)  Metformin Hcl 1000 Mg Tabs (Metformin hcl) .... Take one by mouth two times a day 2)  Flomax 0.4 Mg Cp24  (Tamsulosin hcl) .... Take one by mouth once a day 3)  Zetia 10 Mg Tabs (Ezetimibe) .... Take one by mouth once a day 4)  Avodart 0.5 Mg Caps (Dutasteride) .... Take one by mouth once a day 5)  Glyburide 5 Mg Tabs (Glyburide) .... Take one by mouth every am 6)  Lisinopril 10 Mg Tabs (Lisinopril) .... Take one by mouth once a day 7)  Avandia 8 Mg Tabs (Rosiglitazone maleate) .... Take one by mouth once a day 8)  Multivitamins Tabs (Multiple vitamin) .... Take one by mouth once a day 9)  Vitamin B-12 250 Mg  .... Take one by mouth once a day 10)  Aspirin 81 Mg Tbec (Aspirin) .... Take one by mouth once a day 11)  Toprol Xl 25 Mg Tb24 (Metoprolol succinate) .... Take one by mouth once a day 12)  Simvastatin 80 Mg Tabs (Simvastatin) .... One tab by mouth at night   Patient Instructions: 1)  SGOT, SGPT 272.    6 WEEKS 2)  See me in 3 mos, SGOT, SGPT, CHOL PROFILE  272.     prior  3)  RTC 6 mos A1C prior.    Prescriptions: SIMVASTATIN 80 MG  TABS (SIMVASTATIN) one tab by mouth at night  #30 x 12   Entered and Authorized by:   Shaune Leeks MD   Signed by:   Shaune Leeks MD on 12/25/2007   Method used:   Print then Give to Patient   RxID:   [id number]  ]

## 2010-10-25 NOTE — Assessment & Plan Note (Signed)
Summary: Paul Terrell FLU SHOT/[facility]  Nurse Visit    Prior Medications: METFORMIN HCL 1000 MG TABS (METFORMIN HCL) Take one by mouth two times a day FLOMAX 0.4 MG CP24 (TAMSULOSIN HCL) Take one by mouth once a day ZETIA 10 MG TABS (EZETIMIBE) Take one by mouth once a day AVODART 0.5 MG CAPS (DUTASTERIDE) Take one by mouth once a day GLYBURIDE 5 MG TABS (GLYBURIDE) Take one by mouth every am LISINOPRIL 10 MG TABS (LISINOPRIL) Take one by mouth once a day AVANDIA 8 MG  TABS (ROSIGLITAZONE MALEATE) Take one by mouth once a day MULTIVITAMINS   TABS (MULTIPLE VITAMIN) Take one by mouth once a day VITAMIN B-12   250 MG () Take one by mouth once a day ASPIRIN 81 MG  TBEC (ASPIRIN) Take one by mouth once a day TOPROL XL 25 MG  TB24 (METOPROLOL SUCCINATE) Take one by mouth once a day PRAVACHOL 80 MG  TABS (PRAVASTATIN SODIUM) Take one by mouth at bedtime Current Allergies: No known allergies     Orders Added: 1)  Flu Vaccine 39yrs + [90658] 2)  Admin 1st Vaccine [email or web address]    ]  Influenza Vaccine    Vaccine Type: Fluvax 3+    Site: right deltoid    Mfr: Sanofi Pasteur    Dose: 0.5 ml    Route: IM    Given by: Providence Crosby    Exp. Date: 03/22/2008    Lot #: [id number]    VIS given: 03/22/05 version given July 31, 2007.  Flu Vaccine Consent Questions    Do you have a history of severe allergic reactions to this vaccine? no    Any prior history of allergic reactions to egg and/or gelatin? no    Do you have a sensitivity to the preservative Thimersol? no    Do you have a past history of Guillan-Barre Syndrome? no    Do you currently have an acute febrile illness? no    Have you ever had a severe reaction to latex? no    Vaccine information given and explained to patient? yes

## 2010-10-25 NOTE — Progress Notes (Signed)
Summary: refill requests for [facility]  Phone Note Call from Patient Call back at Home Phone [phone]   Caller: Patient Call For: Paul Leeks MD Summary of Call: Pt requesting refills on metformin, finasteride, glyburide, tamsulosin, simvastatin, metoprolol, and lisinopril.  Forms for [facility] are on your shelf. Initial call taken by: Lowella Petties CMA,  December 15, 2009 9:39 AM  Follow-up for Phone Call        Called and cancelled rx's sent to [facility] in error. Completed forms faxed to [facility] as instructed. Follow-up by: Sydell Axon LPN,  December 18, 2009 11:23 AM    Prescriptions: LISINOPRIL 10 MG TABS (LISINOPRIL) Take one by mouth once a day  #90 x 3   Entered and Authorized by:   Paul Leeks MD   Signed by:   Paul Leeks MD on 12/18/2009   Method used:   Electronically to        [facility] [address] [id number] ([facility]       [address]       [place], [place]  [address]       Ph: [phone]       Fax: [phone]   RxID:   [id number] TOPROL XL 25 MG  TB24 (METOPROLOL SUCCINATE) Take one by mouth once a day  #90 x 3   Entered and Authorized by:   Paul Leeks MD   Signed by:   Paul Leeks MD on 12/18/2009   Method used:   Electronically to        [facility] [address] [id number] ([facility]       [address]       [place], [place]  [address]       Ph: [phone]       Fax: [phone]   RxID:   [id number] SIMVASTATIN 80 MG  TABS (SIMVASTATIN) one tab by mouth at night  #90 x 3   Entered and Authorized by:   Paul Leeks MD   Signed by:   Paul Leeks MD on 12/18/2009   Method used:   Electronically to        [facility] [address] [id number] ([facility]       [address]       [place], [place]  [address]       Ph: [phone]       Fax: [phone]   RxID:   [id number] FLOMAX 0.4 MG CP24 (TAMSULOSIN HCL) Take one  by mouth once a day  #90 x 3   Entered and Authorized by:   Paul Leeks MD   Signed by:   Paul Leeks MD on 12/18/2009   Method used:   Electronically to        [facility] [address] [id number]       [address]       [place], [place]  [address]       Ph: [phone]       Fax: [phone]   RxID:   [id number] METFORMIN HCL 1000 MG TABS (METFORMIN HCL) Take one by mouth two times a day  #180 Tablet x 3   Entered and Authorized by:   Paul Leeks MD   Signed by:  Paul Leeks MD on 12/18/2009   Method used:   Electronically to        [facility] [address] [id number]       [address]       [place], [place]  [address]       Ph: [phone]       Fax: [phone]   RxID:   [id number] GLYBURIDE 5 MG TABS (GLYBURIDE) Take one by mouth every am  #90 x 3   Entered and Authorized by:   Paul Leeks MD   Signed by:   Paul Leeks MD on 12/18/2009   Method used:   Electronically to        [facility] [address] [id number]       [address]       [place], [place]  [address]       Ph: [phone]       Fax: [phone]   RxID:   [id number] FINASTERIDE 5 MG TABS (FINASTERIDE) one tab by mouth once daily  #90 x 3   Entered and Authorized by:   Paul Leeks MD   Signed by:   Paul Leeks MD on 12/18/2009   Method used:   Electronically to        [facility] [address] [id number] ([facility]       [address]       [place], [place]  [address]       Ph: [phone]       Fax: [phone]   RxID:   [id number]

## 2010-10-25 NOTE — Assessment & Plan Note (Signed)
Summary: CHECK UP/BIR   Vital Signs:  Patient Profile:   77 Years Old Male Height:     67 inches Weight:      181.12 pounds Temp:     98.2 degrees F oral Pulse rate:   64 / minute Pulse rhythm:   regular BP sitting:   128 / 68  (left arm) Cuff size:   regular  Vitals Entered By: Sydell Axon (June 29, 2007 10:51 AM)                 Chief Complaint:  check up, ? follow up, and had CPX 03/08.  History of Present Illness: Here withoiut complaint, doing well. Sugars checked daily, running 130-140 as usual. No problems, tolerating meds well. Wonders about stool DNA testing for colon ca screening.  Current Allergies (reviewed today): No known allergies         Impression & Recommendations:  Problem # 1:  HYPERTENSION (ICD-401.9) Assessment: Unchanged Good control. His updated medication list for this problem includes:    Lisinopril 10 Mg Tabs (Lisinopril) .Marland Kitchen... Take one by mouth once a day    Toprol Xl 25 Mg Tb24 (Metoprolol succinate) .Marland Kitchen... Take one by mouth once a day  BP today: 128/68  Labs Reviewed: Creat: 0.9 (12/08/2006) Chol: 113 (12/08/2006)   HDL: 46.3 (12/08/2006)   LDL: 51 (12/08/2006)   TG: 78 (12/08/2006)   Problem # 2:  HYPERLIPIDEMIA (ICD-272.4) Assessment: Unchanged  His updated medication list for this problem includes:    Zetia 10 Mg Tabs (Ezetimibe) .Marland Kitchen... Take one by mouth once a day    Pravachol 80 Mg Tabs (Pravastatin sodium) .Marland Kitchen... Take one by mouth at bedtime   Problem # 3:  DIABETES MELLITUS, TYPE II (ICD-250.00) Assessment: Unchanged  His updated medication list for this problem includes:    Metformin Hcl 1000 Mg Tabs (Metformin hcl) .Marland Kitchen... Take one by mouth two times a day    Glyburide 5 Mg Tabs (Glyburide) .Marland Kitchen... Take one by mouth every am    Lisinopril 10 Mg Tabs (Lisinopril) .Marland Kitchen... Take one by mouth once a day    Avandia 8 Mg Tabs (Rosiglitazone maleate) .Marland Kitchen... Take one by mouth once a day    Aspirin 81 Mg Tbec (Aspirin) .Marland Kitchen...  Take one by mouth once a day   Problem # 4:  CORONARY ARTERY DISEASE (ICD-414.00) No sxs. His updated medication list for this problem includes:    Lisinopril 10 Mg Tabs (Lisinopril) .Marland Kitchen... Take one by mouth once a day    Aspirin 81 Mg Tbec (Aspirin) .Marland Kitchen... Take one by mouth once a day    Toprol Xl 25 Mg Tb24 (Metoprolol succinate) .Marland Kitchen... Take one by mouth once a day  Labs Reviewed: Chol: 113 (12/08/2006)   HDL: 46.3 (12/08/2006)   LDL: 51 (12/08/2006)   TG: 78 (12/08/2006)   Complete Medication List: 1)  Metformin Hcl 1000 Mg Tabs (Metformin hcl) .... Take one by mouth two times a day 2)  Flomax 0.4 Mg Cp24 (Tamsulosin hcl) .... Take one by mouth once a day 3)  Zetia 10 Mg Tabs (Ezetimibe) .... Take one by mouth once a day 4)  Avodart 0.5 Mg Caps (Dutasteride) .... Take one by mouth once a day 5)  Glyburide 5 Mg Tabs (Glyburide) .... Take one by mouth every am 6)  Lisinopril 10 Mg Tabs (Lisinopril) .... Take one by mouth once a day 7)  Avandia 8 Mg Tabs (Rosiglitazone maleate) .... Take one by mouth once a day  8)  Multivitamins Tabs (Multiple vitamin) .... Take one by mouth once a day 9)  Vitamin B-12 250 Mg  .... Take one by mouth once a day 10)  Aspirin 81 Mg Tbec (Aspirin) .... Take one by mouth once a day 11)  Toprol Xl 25 Mg Tb24 (Metoprolol succinate) .... Take one by mouth once a day 12)  Pravachol 80 Mg Tabs (Pravastatin sodium) .... Take one by mouth at bedtime   Patient Instructions: 1)  RTC 3/09 Comp EXam, labs prior.    Prescriptions: AVODART 0.5 MG CAPS (DUTASTERIDE) Take one by mouth once a day  #30 x 12   Entered by:   Sydell Axon   Authorized by:   Shaune Leeks MD   Signed by:   Sydell Axon on 06/29/2007   Method used:   Print then Give to Patient   RxID:   [id number] METFORMIN HCL 1000 MG TABS (METFORMIN HCL) Take one by mouth two times a day  #60 x 12   Entered by:   Sydell Axon   Authorized by:   Shaune Leeks MD   Signed by:   Sydell Axon on 06/29/2007   Method used:   Print then Give to Patient   RxID:   [id number] GLYBURIDE 5 MG TABS (GLYBURIDE) Take one by mouth every am  #30 x 12   Entered by:   Sydell Axon   Authorized by:   Shaune Leeks MD   Signed by:   Sydell Axon on 06/29/2007   Method used:   Print then Give to Patient   RxID:   [id number]   Handout requested. LISINOPRIL 10 MG TABS (LISINOPRIL) Take one by mouth once a day  #30 x 12   Entered by:   Sydell Axon   Authorized by:   Shaune Leeks MD   Signed by:   Sydell Axon on 06/29/2007   Method used:   Print then Give to Patient   RxID:   [id number]   Handout requested.  ] Current Allergies (reviewed today): No known allergies

## 2010-10-25 NOTE — Assessment & Plan Note (Signed)
Summary: REFIL MED/DLO   Vital Signs:  Patient profile:   77 year old male Height:      67 inches Weight:      182 pounds BMI:     28.61 Temp:     97.6 degrees F oral Pulse rate:   60 / minute Pulse rhythm:   regular BP sitting:   130 / 60  (left arm) Cuff size:   regular  Vitals Entered By: Providence Crosby LPN (April 12, 2009 8:53 AM) CC: INSURANCE WANTS PATIENT TO CHANGE AVODART AND AVANDIA   History of Present Illness: Pt here at my request to discuss prescription service requesting change of Avandia to Acarbarose and Avodart tyo Finasteride. I do not thingk Acarbarose is equivalent to Avandia and the pt has had bowel difficulties in the past with other medications and other situations so am not enthused about switching to save money. However, I am getting more alert to discussions of Avandia and it inherent risks. I still have not seen any more convincing data, but have seen more intense rhetoric and that is becoming concerning. Pt has had great response to the medication and has really had no side efeects so am inclined to continue, as is he. He has had some transient edem of the left foot and ankle a few wekks ago durring the heat but nothing significant. Kidney and liver function have been stable in the past.  Problems Prior to Update: 1)  Uri  (ICD-465.9) 2)  Special Screening Malig Neoplasms Other Sites  (ICD-V76.49) 3)  Neoplasm, Malignant, Carcinoma, Basal Cell, Recurrent Scalp  (ICD-173.9) 4)  Special Screening Malignant Neoplasm of Prostate  (ICD-V76.44) 5)  Hypertension  (ICD-401.9) 6)  Hyperlipidemia  (ICD-272.4) 7)  Diabetes Mellitus, Type II  (ICD-250.00) 8)  Coronary Artery Disease  (ICD-414.00)  Medications Prior to Update: 1)  Metformin Hcl 1000 Mg Tabs (Metformin Hcl) .... Take One By Mouth Two Times A Day 2)  Flomax 0.4 Mg Cp24 (Tamsulosin Hcl) .... Take One By Mouth Once A Day 3)  Avodart 0.5 Mg Caps (Dutasteride) .... Take One By Mouth Once A Day 4)  Glyburide 5  Mg Tabs (Glyburide) .... Take One By Mouth Every Am 5)  Lisinopril 10 Mg Tabs (Lisinopril) .... Take One By Mouth Once A Day 6)  Avandia 8 Mg  Tabs (Rosiglitazone Maleate) .... Take One By Mouth Once A Day 7)  Multivitamins   Tabs (Multiple Vitamin) .... Take One By Mouth Once A Day 8)  Vitamin B-12   250 Mg .... Take One By Mouth Once A Day 9)  Aspirin 81 Mg  Tbec (Aspirin) .... Take One By Mouth Once A Day 10)  Toprol Xl 25 Mg  Tb24 (Metoprolol Succinate) .... Take One By Mouth Once A Day 11)  Simvastatin 80 Mg  Tabs (Simvastatin) .... One Tab By Mouth At Night  Allergies (verified): No Known Drug Allergies  Physical Exam  General:  Well-developed,well-nourished,in no acute distress; alert,appropriate and cooperative throughout examination, nontoxic but with chills when seen. Head:  Normocephalic and atraumatic without obvious abnormalities. No apparent alopecia or balding, mild thinning. Eyes:  Conjunctiva clear bilaterally.  Ears:  External ear exam shows no significant lesions or deformities.  Otoscopic examination reveals clear canals, tympanic membranes are intact bilaterally without bulging, retraction, inflammation or discharge. Hearing is grossly normal bilaterally. H/As bilat. Nose:  External nasal examination shows no deformity or inflammation. Nasal mucosa are pink and moist without lesions or exudates. Deviated right. Mouth:  Oral  mucosa and oropharynx without lesions or exudates.  Teeth in good repair. Very dry mucosa. Neck:  No deformities, masses, or tenderness noted. Lungs:  Normal respiratory effort, chest expands symmetrically. Lungs are clear to auscultation, no crackles or wheezes. Heart:  Normal rate and regular rhythm. S1 and S2 normal without gallop, murmur, click, rub or other extra sounds. Extremities:  No clubbing, cyanosis, edema, or deformity noted with normal full range of motion of all joints.  No swelling at all today.   Impression & Recommendations:   Problem # 1:  DIABETES MELLITUS, TYPE II (ICD-250.00) Assessment Unchanged  Stable...cont Avandia for now. Has toplerated well (see HPI) His updated medication list for this problem includes:    Metformin Hcl 1000 Mg Tabs (Metformin hcl) .Marland Kitchen... Take one by mouth two times a day    Glyburide 5 Mg Tabs (Glyburide) .Marland Kitchen... Take one by mouth every am    Lisinopril 10 Mg Tabs (Lisinopril) .Marland Kitchen... Take one by mouth once a day    Avandia 8 Mg Tabs (Rosiglitazone maleate) .Marland Kitchen... Take one by mouth once a day    Aspirin 81 Mg Tbec (Aspirin) .Marland Kitchen... Take one by mouth once a day  Labs Reviewed: Creat: 0.9 (12/26/2008)   Microalbumin: 3.3 (11/26/2005)  Last Eye Exam: normal (04/03/2009) Reviewed HgBA1c results: 5.8 (12/26/2008)  5.8 (06/28/2008)  Problem # 2:  HYPERTROPHY PROSTATE W/UR OBST & OTH LUTS (ICD-600.01) Assessment: Unchanged Stable on both Flomax (tried generic which didn't help) and Avodart. Am willing to try Finasteride now that he is controlled.  Problem # 3:  HYPERTENSION (ICD-401.9) Assessment: Unchanged  His updated medication list for this problem includes:    Lisinopril 10 Mg Tabs (Lisinopril) .Marland Kitchen... Take one by mouth once a day    Toprol Xl 25 Mg Tb24 (Metoprolol succinate) .Marland Kitchen... Take one by mouth once a day  BP today: 130/60 Prior BP: 120/54 (01/31/2009)  Prior 10 Yr Risk Heart Disease: N/A (12/28/2008)  Labs Reviewed: K+: 5.1 (12/26/2008) Creat: : 0.9 (12/26/2008)   Chol: 121 (12/26/2008)   HDL: 44.90 (12/26/2008)   LDL: 56 (12/26/2008)   TG: 99.0 (12/26/2008)  Complete Medication List: 1)  Metformin Hcl 1000 Mg Tabs (Metformin hcl) .... Take one by mouth two times a day 2)  Flomax 0.4 Mg Cp24 (Tamsulosin hcl) .... Take one by mouth once a day 3)  Finasteride 5 Mg Tabs (Finasteride) .... One tab by mouth once daily 4)  Glyburide 5 Mg Tabs (Glyburide) .... Take one by mouth every am 5)  Lisinopril 10 Mg Tabs (Lisinopril) .... Take one by mouth once a day 6)  Avandia 8 Mg  Tabs (Rosiglitazone maleate) .... Take one by mouth once a day 7)  Multivitamins Tabs (Multiple vitamin) .... Take one by mouth once a day 8)  Vitamin B-12 250 Mg  .... Take one by mouth once a day 9)  Aspirin 81 Mg Tbec (Aspirin) .... Take one by mouth once a day 10)  Toprol Xl 25 Mg Tb24 (Metoprolol succinate) .... Take one by mouth once a day 11)  Simvastatin 80 Mg Tabs (Simvastatin) .... One tab by mouth at night  Patient Instructions: 1)  Change appt in Oct to Comp Exam with labs prior. Prescriptions: FINASTERIDE 5 MG TABS (FINASTERIDE) one tab by mouth once daily  #90 x 4   Entered and Authorized by:   Shaune Leeks MD   Signed by:   Shaune Leeks MD on 04/12/2009   Method used:   Electronically to  [facility] (retail)       [address] [place], [place]  [address]       Ph: [phone]       Fax: [phone]   RxID:   [id number]

## 2010-10-25 NOTE — Consult Note (Signed)
Summary: [facility]/Dr. [facility]/Dr. Charlotte Sanes   Imported By: Eleonore Chiquito 03/30/2008 09:57:28  _____________________________________________________________________  External Attachment:    Type:   Image     Comment:   External Document  Appended Document: [facility]/Dr. Charlotte Sanes    Clinical Lists Changes  Observations: Added new observation of EYES COMMENT: 03/2009 (03/31/2008 7:15) Added new observation of DIAB EYE EX: normal (03/29/2008 7:16)         Diabetes Management Exam:    Eye Exam:       Eye Exam done elsewhere          Date: 03/29/2008          Results: normal          Done by: [facility]

## 2010-10-25 NOTE — Assessment & Plan Note (Signed)
Summary: 3 MONTH FOLLOW UP/[facility]   Vital Signs:  Patient Profile:   77 Years Old Male Height:     67 inches Weight:      188 pounds Temp:     97.4 degrees F oral Pulse rate:   68 / minute Pulse rhythm:   regular BP sitting:   120 / 60  (left arm) Cuff size:   regular  Vitals Entered ByProvidence Crosby (April 05, 2008 2:45 PM)                 Chief Complaint:  3 month followup.  History of Present Illness: Here for recheck of cholesterol, changed Zetia to Simvastatin. He is tolerating ok whereas we tried everything in the past and he couldn't take it. He is having no difficulties. He wonders about changing Avandia to generic as well but had to tell him there was not an alternative. He expected that . He has a son who is on an insulin pump.    Prior Medications Reviewed Using: Patient Recall  Current Allergies: No known allergies       Physical Exam  General:     Well-developed,well-nourished,in no acute distress; alert,appropriate and cooperative throughout examination Head:     Normocephalic and atraumatic without obvious abnormalities. No apparent alopecia or balding. Eyes:     Conjunctiva clear bilaterally.  Ears:     External ear exam shows no significant lesions or deformities.  Otoscopic examination reveals clear canals, tympanic membranes are intact bilaterally without bulging, retraction, inflammation or discharge. Hearing is grossly normal bilaterally. Nose:     External nasal examination shows no deformity or inflammation. Nasal mucosa are pink and moist without lesions or exudates. Mouth:     Oral mucosa and oropharynx without lesions or exudates.  Teeth in good repair. Neck:     No deformities, masses, or tenderness noted. Chest Wall:     No deformities, masses, tenderness or gynecomastia noted. Lungs:     Normal respiratory effort, chest expands symmetrically. Lungs are clear to auscultation, no crackles or wheezes. Heart:     Normal rate and regular  rhythm. S1 and S2 normal without gallop, murmur, click, rub or other extra sounds.    Impression & Recommendations:  Problem # 1:  HYPERLIPIDEMIA (ICD-272.4) Assessment: Improved Very slightly better on Zocor 80 than Zetia 10mg  His updated medication list for this problem includes:    Zetia 10 Mg Tabs (Ezetimibe) .Marland Kitchen... Take one by mouth once a day    Simvastatin 80 Mg Tabs (Simvastatin) ..... One tab by mouth at night   Problem # 2:  HYPERTENSION (ICD-401.9) Assessment: Unchanged  His updated medication list for this problem includes:    Lisinopril 10 Mg Tabs (Lisinopril) .Marland Kitchen... Take one by mouth once a day    Toprol Xl 25 Mg Tb24 (Metoprolol succinate) .Marland Kitchen... Take one by mouth once a day  BP today: 120/60 Prior BP: 130/60 (12/25/2007)  Labs Reviewed: Creat: 0.9 (12/21/2007) Chol: 120 (04/01/2008)   HDL: 42.5 (04/01/2008)   LDL: 59 (04/01/2008)   TG: 94 (04/01/2008)   Complete Medication List: 1)  Metformin Hcl 1000 Mg Tabs (Metformin hcl) .... Take one by mouth two times a day 2)  Flomax 0.4 Mg Cp24 (Tamsulosin hcl) .... Take one by mouth once a day 3)  Zetia 10 Mg Tabs (Ezetimibe) .... Take one by mouth once a day 4)  Avodart 0.5 Mg Caps (Dutasteride) .... Take one by mouth once a day 5)  Glyburide 5 Mg  Tabs (Glyburide) .... Take one by mouth every am 6)  Lisinopril 10 Mg Tabs (Lisinopril) .... Take one by mouth once a day 7)  Avandia 8 Mg Tabs (Rosiglitazone maleate) .... Take one by mouth once a day 8)  Multivitamins Tabs (Multiple vitamin) .... Take one by mouth once a day 9)  Vitamin B-12 250 Mg  .... Take one by mouth once a day 10)  Aspirin 81 Mg Tbec (Aspirin) .... Take one by mouth once a day 11)  Toprol Xl 25 Mg Tb24 (Metoprolol succinate) .... Take one by mouth once a day 12)  Simvastatin 80 Mg Tabs (Simvastatin) .... One tab by mouth at night   Patient Instructions: 1)  RTC as needed    ]

## 2010-10-25 NOTE — Assessment & Plan Note (Signed)
Summary: 6 MTH FOLLOW UP / [facility]   Vital Signs:  Patient profile:   77 year old male Weight:      186 pounds Temp:     97.6 degrees F oral Pulse rate:   60 / minute Pulse rhythm:   regular BP sitting:   122 / 60  (left arm) Cuff size:   regular  Vitals Entered By: Sydell Axon LPN (June 29, 2009 10:05 AM) CC: 6 Month follow-up   History of Present Illness: Here for 6 month f/u. Has no complaints and feels well. Is still on Avandia. I am nervous enough that I think wotrth switching. Still see no new evidence but media pressure makes medication a problem.  Problems Prior to Update: 1)  Hypertrophy Prostate W/ur Obst & Oth Luts  (ICD-600.01) 2)  Special Screening Malig Neoplasms Other Sites  (ICD-V76.49) 3)  Neoplasm, Malignant, Carcinoma, Basal Cell, Recurrent Scalp  (ICD-173.9) 4)  Special Screening Malignant Neoplasm of Prostate  (ICD-V76.44) 5)  Hypertension  (ICD-401.9) 6)  Hyperlipidemia  (ICD-272.4) 7)  Diabetes Mellitus, Type II  (ICD-250.00) 8)  Coronary Artery Disease  (ICD-414.00)  Medications Prior to Update: 1)  Metformin Hcl 1000 Mg Tabs (Metformin Hcl) .... Take One By Mouth Two Times A Day 2)  Flomax 0.4 Mg Cp24 (Tamsulosin Hcl) .... Take One By Mouth Once A Day 3)  Finasteride 5 Mg Tabs (Finasteride) .... One Tab By Mouth Once Daily 4)  Glyburide 5 Mg Tabs (Glyburide) .... Take One By Mouth Every Am 5)  Lisinopril 10 Mg Tabs (Lisinopril) .... Take One By Mouth Once A Day 6)  Avandia 8 Mg  Tabs (Rosiglitazone Maleate) .... Take One By Mouth Once A Day 7)  Multivitamins   Tabs (Multiple Vitamin) .... Take One By Mouth Once A Day 8)  Vitamin B-12   250 Mg .... Take One By Mouth Once A Day 9)  Aspirin 81 Mg  Tbec (Aspirin) .... Take One By Mouth Once A Day 10)  Toprol Xl 25 Mg  Tb24 (Metoprolol Succinate) .... Take One By Mouth Once A Day 11)  Simvastatin 80 Mg  Tabs (Simvastatin) .... One Tab By Mouth At Night  Allergies: No Known Drug Allergies  Physical Exam   General:  Well-developed,well-nourished,in no acute distress; alert,appropriate and cooperative throughout examination, nontoxic but with chills when seen. Head:  Normocephalic and atraumatic without obvious abnormalities. No apparent alopecia or balding, mild thinning. Eyes:  Conjunctiva clear bilaterally.  Ears:  External ear exam shows no significant lesions or deformities.  Otoscopic examination reveals clear canals, tympanic membranes are intact bilaterally without bulging, retraction, inflammation or discharge. Hearing is grossly normal bilaterally. H/As bilat. Nose:  External nasal examination shows no deformity or inflammation. Nasal mucosa are pink and moist without lesions or exudates. Deviated right. Mouth:  Oral mucosa and oropharynx without lesions or exudates.  Teeth in good repair. Very dry mucosa. Neck:  No deformities, masses, or tenderness noted. Lungs:  Normal respiratory effort, chest expands symmetrically. Lungs are clear to auscultation, no crackles or wheezes. Heart:  Normal rate and regular rhythm. S1 and S2 normal without gallop, murmur, click, rub or other extra sounds.   Impression & Recommendations:  Problem # 1:  DIABETES MELLITUS, TYPE II (ICD-250.00) Assessment Unchanged  Great control. Will switch 8 of Avandia to 30 of Actos since A1C 6.0 today. Will f/u at usual Comp Exam as he still has approx 6 weeks or more of Avandia and doesn't want to waste it.  Will recheck 3mos after startinfg, approx 6 mos at regular appt. His updated medication list for this problem includes:    Metformin Hcl 1000 Mg Tabs (Metformin hcl) .Marland Kitchen... Take one by mouth two times a day    Glyburide 5 Mg Tabs (Glyburide) .Marland Kitchen... Take one by mouth every am    Lisinopril 10 Mg Tabs (Lisinopril) .Marland Kitchen... Take one by mouth once a day    Actos 30 Mg Tabs (Pioglitazone hcl) ..... One tab by mouth once daily    Aspirin 81 Mg Tbec (Aspirin) .Marland Kitchen... Take one by mouth once a day  Labs Reviewed: Creat: 0.9  (12/26/2008)   Microalbumin: 3.3 (11/26/2005)  Last Eye Exam: normal (04/03/2009) Reviewed HgBA1c results: 6.0 (06/26/2009)  5.8 (12/26/2008)  Problem # 2:  HYPERTENSION (ICD-401.9) Assessment: Unchanged Stable. His updated medication list for this problem includes:    Lisinopril 10 Mg Tabs (Lisinopril) .Marland Kitchen... Take one by mouth once a day    Toprol Xl 25 Mg Tb24 (Metoprolol succinate) .Marland Kitchen... Take one by mouth once a day  BP today: 122/60 Prior BP: 130/60 (04/12/2009)  Prior 10 Yr Risk Heart Disease: N/A (12/28/2008)  Labs Reviewed: K+: 5.1 (12/26/2008) Creat: : 0.9 (12/26/2008)   Chol: 121 (12/26/2008)   HDL: 44.90 (12/26/2008)   LDL: 56 (12/26/2008)   TG: 99.0 (12/26/2008)  Problem # 3:  CORONARY ARTERY DISEASE (ICD-414.00)  Stable...no complaints. His updated medication list for this problem includes:    Lisinopril 10 Mg Tabs (Lisinopril) .Marland Kitchen... Take one by mouth once a day    Aspirin 81 Mg Tbec (Aspirin) .Marland Kitchen... Take one by mouth once a day    Toprol Xl 25 Mg Tb24 (Metoprolol succinate) .Marland Kitchen... Take one by mouth once a day  Labs Reviewed: Chol: 121 (12/26/2008)   HDL: 44.90 (12/26/2008)   LDL: 56 (12/26/2008)   TG: 99.0 (12/26/2008)  Complete Medication List: 1)  Metformin Hcl 1000 Mg Tabs (Metformin hcl) .... Take one by mouth two times a day 2)  Flomax 0.4 Mg Cp24 (Tamsulosin hcl) .... Take one by mouth once a day 3)  Finasteride 5 Mg Tabs (Finasteride) .... One tab by mouth once daily 4)  Glyburide 5 Mg Tabs (Glyburide) .... Take one by mouth every am 5)  Lisinopril 10 Mg Tabs (Lisinopril) .... Take one by mouth once a day 6)  Actos 30 Mg Tabs (Pioglitazone hcl) .... One tab by mouth once daily 7)  Multivitamins Tabs (Multiple vitamin) .... Take one by mouth once a day 8)  Vitamin B-12 250 Mg  .... Take one by mouth once a day 9)  Aspirin 81 Mg Tbec (Aspirin) .... Take one by mouth once a day 10)  Toprol Xl 25 Mg Tb24 (Metoprolol succinate) .... Take one by mouth once a day  11)  Simvastatin 80 Mg Tabs (Simvastatin) .... One tab by mouth at night  Patient Instructions: 1)  RTC 6 mos, Compp Exam, labs prior. Prescriptions: ACTOS 30 MG TABS (PIOGLITAZONE HCL) one tab by mouth once daily  #90 x 4   Entered and Authorized by:   Shaune Leeks MD   Signed by:   Shaune Leeks MD on 06/29/2009   Method used:   Print then Give to Patient   RxID:   [id number]   Current Allergies (reviewed today): No known allergies

## 2010-10-25 NOTE — Medication Information (Signed)
Summary: [employer] Prior Auth Approval-Zetia-Indefinite  [employer] Prior Auth Approval-Zetia-Indefinite   Imported By: Beau Fanny 10/27/2007 15:59:09  _____________________________________________________________________  External Attachment:    Type:   Image     Comment:   External Document

## 2010-10-25 NOTE — Assessment & Plan Note (Signed)
Summary: Paul Terrell flu shot/[facility]  Nurse Visit   Allergies: No Known Drug Allergies  Immunizations Administered:  Influenza Vaccine # 1:    Vaccine Type: Fluvax 3+    Site: left deltoid    Mfr: [employer]Smith[employer]    Dose: 0.5 ml    Route: IM    Given by: Mervin Hack CMA (AAMA)    Exp. Date: 03/22/2010    Lot #: [id number]    VIS given: 04/16/07 version given June 22, 2009.  Flu Vaccine Consent Questions:    Do you have a history of severe allergic reactions to this vaccine? no    Any prior history of allergic reactions to egg and/or gelatin? no    Do you have a sensitivity to the preservative Thimersol? no    Do you have a past history of Guillan-Barre Syndrome? no    Do you currently have an acute febrile illness? no    Have you ever had a severe reaction to latex? no    Vaccine information given and explained to patient? yes  Orders Added: 1)  Flu Vaccine 66yrs + [90658] 2)  Admin 1st Vaccine [09811]

## 2010-10-25 NOTE — Consult Note (Signed)
Summary: Dr.Thornton,[facility],Note  Dr.Thornton,[facility],Note   Imported By: Beau Fanny 12/23/2007 13:17:33  _____________________________________________________________________  External Attachment:    Type:   Image     Comment:   External Document

## 2010-10-25 NOTE — Assessment & Plan Note (Signed)
Summary: FOLLOW UP / LFW   Vital Signs:  Patient profile:   77 year old male Height:      67 inches Weight:      182.0 pounds BMI:     28.61 Temp:     97.6 degrees F oral Pulse rate:   72 / minute Pulse rhythm:   regular BP sitting:   150 / 80  (left arm) Cuff size:   regular  Vitals Entered By: Benny Lennert CMA Duncan Dull) (April 04, 2010 8:01 AM)  History of Present Illness: Chief complaint 3 month follow up   DM: a1c at 6.1, tol all meds, no probs  CAD: on meds, ASA, bblockade, ace  HTN: BP slightly high today, has been stable  Lipids, reviewed, LDL < 40, on Zocor 80 right now  Allergies (verified): No Known Drug Allergies  Past History:  Past medical, surgical, family and social histories (including risk factors) reviewed, and no changes noted (except as noted below).  Past Medical History: Reviewed history from 04/13/2007 and no changes required. Coronary artery disease: stent placed :07/2000 Diabetes mellitus, type II: 07/2000 Hyperlipidemia: 07/2000 Hypertension: 07/2000  Past Surgical History: Reviewed history from 12/23/2007 and no changes required. RUPTURED DISC IN NECK C7 (DR CLONINGER) LITHOTRIPSY (DR PETERSEN) 1991 R HAND LAC REPAIR (DR KUZMA) 1996 R KNEE ARTHROSCOPY 09/1999 R BUNIONECTOMY (DR PETRINITZ) 02/2000 ETT POS  CATH STENT 07/2000 STRESS CARDIOLITE WNL EF 65%:(11/11/2002) ROTATOR CUFF REPAIR RIGHT (DR. August Saucer):  (10/2003) CARDIOLITE EF 57% NORMAL:(10/01/2006) REEXCISION SCALP BCC (DR Talmadge [employer] Ollen Bowl) (12/16/2007)  Family History: Reviewed history from 01/01/2010 and no changes required. Father: DECEASED 27 YOA MI  / HTN Mother: DECEASED 38 YOA STROKE( LIVED 2 YEARS ) CAD, ANGIO. PLASTY X 2 / HTN SISTERS A 70  HTN  CERVICAL CANCER (1978) SISTER A 68  HTN 3 BROTHERS 2 DECEASED MI + HTN 1 BROTHER A 78  HTN   CV: + FATHER MI/ +MOTHER CAD/ + 2 BROTHERS MI HBP: + EVERYONE DM: + SELF GOUT/ARTHRITIS: PROSTATE CANCER:    BREAST/OVARIAN/UTERINE CANCER: + SISTER CERVICAL/ OLDEST SISTER COLON/CANCER: DEPRESSION: NEGATIVE ETOH/DRUG ABUSE : NEGATIVE OTHER: + STROKE MOTHER  Social History: Reviewed history from 04/13/2007 and no changes required. Marital Status: Married LIVES WITH WIFE Children: 2  Occupation: CARPENTRY  Review of Systems       REVIEW OF SYSTEMS  GEN: No systemic complaints, no fevers, chills, sweats, or other acute illnesses MSK: Detailed in the HPI GI: tolerating PO intake without difficulty Neuro: No numbness, parasthesias, or tingling associated. Otherwise the pertinent positives of the ROS are noted above.    Physical Exam  General:  Well-developed,well-nourished,in no acute distress; alert,appropriate and cooperative throughout examination Head:  Normocephalic and atraumatic without obvious abnormalities. No apparent alopecia or balding. Ears:  no external deformities.   Nose:  no external deformity.   Lungs:  Normal respiratory effort, chest expands symmetrically. Lungs are clear to auscultation, no crackles or wheezes. Heart:  Normal rate and regular rhythm. S1 and S2 normal without gallop, murmur, click, rub or other extra sounds. Extremities:  No clubbing, cyanosis, edema, or deformity noted with normal full range of motion of all joints.   Neurologic:  alert & oriented X3 and gait normal.   Skin:  Intact without suspicious lesions or rashes Psych:  Cognition and judgment appear intact. Alert and cooperative with normal attention span and concentration. No apparent delusions, illusions, hallucinations   Impression & Recommendations:  Problem # 1:  DIABETES MELLITUS, TYPE  II (ICD-250.00) Assessment Unchanged  His updated medication list for this problem includes:    Metformin Hcl 1000 Mg Tabs (Metformin hcl) .Marland Kitchen... Take one by mouth two times a day    Glyburide 5 Mg Tabs (Glyburide) .Marland Kitchen... Take one by mouth every am    Lisinopril 10 Mg Tabs (Lisinopril) .Marland Kitchen... Take one  by mouth once a day    Aspirin 81 Mg Tbec (Aspirin) .Marland Kitchen... Take one by mouth once a day  Labs Reviewed: Creat: 0.9 (04/02/2010)   Microalbumin: 3.3 (11/26/2005)  Last Eye Exam: normal (12/22/2009) Reviewed HgBA1c results: 6.1 (04/02/2010)  6.0 (12/27/2009)  Problem # 2:  HYPERTENSION (ICD-401.9) ok to follow-up given longterm stability  His updated medication list for this problem includes:    Lisinopril 10 Mg Tabs (Lisinopril) .Marland Kitchen... Take one by mouth once a day    Toprol Xl 25 Mg Tb24 (Metoprolol succinate) .Marland Kitchen... Take one by mouth once a day  BP today: 150/80 Prior BP: 118/68 (01/01/2010)  Prior 10 Yr Risk Heart Disease: N/A (12/28/2008)  Labs Reviewed: K+: 4.9 (04/02/2010) Creat: : 0.9 (04/02/2010)   Chol: 92 (12/27/2009)   HDL: 44.20 (12/27/2009)   LDL: 37 (12/27/2009)   TG: 53.0 (12/27/2009)  Problem # 3:  HYPERLIPIDEMIA (ICD-272.4)  His updated medication list for this problem includes:    Zocor 40 Mg Tabs (Simvastatin)  Problem # 4:  CORONARY ARTERY DISEASE (ICD-414.00)  His updated medication list for this problem includes:    Lisinopril 10 Mg Tabs (Lisinopril) .Marland Kitchen... Take one by mouth once a day    Aspirin 81 Mg Tbec (Aspirin) .Marland Kitchen... Take one by mouth once a day    Toprol Xl 25 Mg Tb24 (Metoprolol succinate) .Marland Kitchen... Take one by mouth once a day  Complete Medication List: 1)  Metformin Hcl 1000 Mg Tabs (Metformin hcl) .... Take one by mouth two times a day 2)  Flomax 0.4 Mg Cp24 (Tamsulosin hcl) .... Take one by mouth once a day 3)  Finasteride 5 Mg Tabs (Finasteride) .... One tab by mouth once daily 4)  Glyburide 5 Mg Tabs (Glyburide) .... Take one by mouth every am 5)  Lisinopril 10 Mg Tabs (Lisinopril) .... Take one by mouth once a day 6)  Multivitamins Tabs (Multiple vitamin) .... Take one by mouth once a day 7)  Vitamin B-12 250 Mg  .... Take one by mouth once a day 8)  Aspirin 81 Mg Tbec (Aspirin) .... Take one by mouth once a day 9)  Toprol Xl 25 Mg Tb24  (Metoprolol succinate) .... Take one by mouth once a day 10)  Zocor 40 Mg Tabs (Simvastatin) 11)  Onetouch Ultra System W/device Kit (Blood glucose monitoring suppl) .... Use as directed 12)  Onetouch Ultra Test Strp (Glucose blood) .... Patient test once daily dx:250.00 13)  Onetouch Ultrasoft Lancets Misc (Lancets) .... Patient test once daily dx: 250.00  Patient Instructions: 1)  f/u 3-4 months with Dr. Hetty Ely 2)  BMP prior to visit, ICD-9: 250.00 3)  HgBA1c prior to visit  ICD-9:  4)  Urine Microalbumin prior to visit ICD-9 :   Current Allergies (reviewed today): No known allergies

## 2010-11-20 NOTE — Letter (Signed)
Summary: Dr Donnie Aho Office note   Dr Donnie Aho Office note   Imported By: Kassie Mends 11/13/2010 10:06:21  _____________________________________________________________________  External Attachment:    Type:   Image     Comment:   External Document

## 2010-11-22 ENCOUNTER — Encounter: Payer: Self-pay | Admitting: Family Medicine

## 2010-11-24 ENCOUNTER — Encounter: Payer: Self-pay | Admitting: Family Medicine

## 2010-12-13 ENCOUNTER — Encounter: Payer: Self-pay | Admitting: Family Medicine

## 2010-12-18 ENCOUNTER — Encounter: Payer: Self-pay | Admitting: Family Medicine

## 2010-12-19 ENCOUNTER — Other Ambulatory Visit: Payer: Self-pay | Admitting: *Deleted

## 2010-12-19 MED ORDER — TAMSULOSIN HCL 0.4 MG PO CAPS: 0.4000 mg | ORAL_CAPSULE | Freq: Every day | ORAL | Status: DC

## 2010-12-19 MED ORDER — METFORMIN HCL 1000 MG PO TABS: 1000.0000 mg | ORAL_TABLET | Freq: Two times a day (BID) | ORAL | Status: DC

## 2010-12-19 MED ORDER — GLYBURIDE 5 MG PO TABS: 5.0000 mg | ORAL_TABLET | ORAL | Status: DC

## 2010-12-19 MED ORDER — FINASTERIDE 5 MG PO TABS: 5.0000 mg | ORAL_TABLET | Freq: Every day | ORAL | Status: DC

## 2010-12-19 MED ORDER — METOPROLOL SUCCINATE ER 25 MG PO TB24: 25.0000 mg | ORAL_TABLET | Freq: Every day | ORAL | Status: DC

## 2010-12-20 NOTE — Miscellaneous (Signed)
  Clinical Lists Changes  Observations: Added new observation of PAST SURG HX: RUPTURED DISC IN NECK C7 (DR CLONINGER) LITHOTRIPSY (DR PETERSEN) 1991 R HAND LAC REPAIR (DR KUZMA) 1996 R KNEE ARTHROSCOPY 09/1999 R BUNIONECTOMY (DR PETRINITZ) 02/2000 ETT POS  CATH STENT 07/2000 STRESS CARDIOLITE WNL EF 65%:(11/11/2002) ROTATOR CUFF REPAIR RIGHT (DR. August Saucer):  (10/2003) CARDIOLITE EF 57% NORMAL:(10/01/2006) REEXCISION SCALP BCC (DR Talmadge [employer] Ollen Bowl) (12/16/2007) STRESS/LEXISCAN CARDIOLITE NML (12/13/2010) (12/13/2010 16:51)      Past Surgical History:    RUPTURED DISC IN NECK C7 (DR CLONINGER)    LITHOTRIPSY (DR PETERSEN) 1991    R HAND LAC REPAIR (DR KUZMA) 1996    R KNEE ARTHROSCOPY 09/1999    R BUNIONECTOMY (DR PETRINITZ) 02/2000    ETT POS  CATH STENT 07/2000    STRESS CARDIOLITE WNL EF 65%:(11/11/2002)    ROTATOR CUFF REPAIR RIGHT (DR. August Saucer):  (10/2003)    CARDIOLITE EF 57% NORMAL:(10/01/2006)    REEXCISION SCALP BCC (DR Talmadge [employer] Ollen Bowl) (12/16/2007)    STRESS/LEXISCAN CARDIOLITE NML (12/13/2010)

## 2010-12-24 LAB — HM DIABETES EYE EXAM: HM Diabetic Eye Exam: NORMAL

## 2010-12-26 ENCOUNTER — Encounter: Payer: Self-pay | Admitting: Family Medicine

## 2011-01-03 ENCOUNTER — Other Ambulatory Visit: Payer: Self-pay | Admitting: Family Medicine

## 2011-01-03 DIAGNOSIS — I2510 Atherosclerotic heart disease of native coronary artery without angina pectoris: Secondary | ICD-10-CM

## 2011-01-03 DIAGNOSIS — E119 Type 2 diabetes mellitus without complications: Secondary | ICD-10-CM

## 2011-01-03 DIAGNOSIS — E785 Hyperlipidemia, unspecified: Secondary | ICD-10-CM

## 2011-01-03 DIAGNOSIS — N401 Enlarged prostate with lower urinary tract symptoms: Secondary | ICD-10-CM

## 2011-01-03 DIAGNOSIS — I10 Essential (primary) hypertension: Secondary | ICD-10-CM

## 2011-01-07 ENCOUNTER — Other Ambulatory Visit (INDEPENDENT_AMBULATORY_CARE_PROVIDER_SITE_OTHER): Payer: Medicare Other | Admitting: Family Medicine

## 2011-01-07 DIAGNOSIS — E119 Type 2 diabetes mellitus without complications: Secondary | ICD-10-CM

## 2011-01-07 DIAGNOSIS — N401 Enlarged prostate with lower urinary tract symptoms: Secondary | ICD-10-CM

## 2011-01-07 DIAGNOSIS — E785 Hyperlipidemia, unspecified: Secondary | ICD-10-CM

## 2011-01-07 DIAGNOSIS — I10 Essential (primary) hypertension: Secondary | ICD-10-CM

## 2011-01-07 DIAGNOSIS — I2510 Atherosclerotic heart disease of native coronary artery without angina pectoris: Secondary | ICD-10-CM

## 2011-01-07 LAB — CBC WITH DIFFERENTIAL/PLATELET
Basophils Absolute: 0 10*3/uL (ref 0.0–0.1)
Basophils Relative: 0.5 % (ref 0.0–3.0)
Eosinophils Absolute: 0.1 10*3/uL (ref 0.0–0.7)
Eosinophils Relative: 1.9 % (ref 0.0–5.0)
HCT: 40.8 % (ref 39.0–52.0)
Hemoglobin: 14.1 g/dL (ref 13.0–17.0)
Lymphocytes Relative: 34.5 % (ref 12.0–46.0)
Lymphs Abs: 2.1 10*3/uL (ref 0.7–4.0)
MCHC: 34.6 g/dL (ref 30.0–36.0)
MCV: 94.4 fl (ref 78.0–100.0)
Monocytes Absolute: 0.6 10*3/uL (ref 0.1–1.0)
Monocytes Relative: 9.2 % (ref 3.0–12.0)
Neutro Abs: 3.3 10*3/uL (ref 1.4–7.7)
Neutrophils Relative %: 53.9 % (ref 43.0–77.0)
Platelets: 142 10*3/uL — ABNORMAL LOW (ref 150.0–400.0)
RBC: 4.33 Mil/uL (ref 4.22–5.81)
RDW: 13.6 % (ref 11.5–14.6)
WBC: 6 10*3/uL (ref 4.5–10.5)

## 2011-01-07 LAB — HEPATIC FUNCTION PANEL
ALT: 28 U/L (ref 0–53)
AST: 23 U/L (ref 0–37)
Albumin: 3.8 g/dL (ref 3.5–5.2)
Alkaline Phosphatase: 58 U/L (ref 39–117)
Bilirubin, Direct: 0.1 mg/dL (ref 0.0–0.3)
Total Bilirubin: 1 mg/dL (ref 0.3–1.2)
Total Protein: 6.3 g/dL (ref 6.0–8.3)

## 2011-01-07 LAB — BASIC METABOLIC PANEL
BUN: 12 mg/dL (ref 6–23)
CO2: 28 mEq/L (ref 19–32)
Calcium: 9.3 mg/dL (ref 8.4–10.5)
Chloride: 104 mEq/L (ref 96–112)
Creatinine, Ser: 0.7 mg/dL (ref 0.4–1.5)
GFR: 116 mL/min (ref >60.00)
Glucose, Bld: 134 mg/dL — ABNORMAL HIGH (ref 70–99)
Potassium: 4.9 mEq/L (ref 3.5–5.1)
Sodium: 139 mEq/L (ref 135–145)

## 2011-01-07 LAB — MICROALBUMIN / CREATININE URINE RATIO
Creatinine,U: 138.6 mg/dL
Microalb Creat Ratio: 1.2 mg/g (ref 0.0–30.0)
Microalb, Ur: 1.6 mg/dL (ref 0.0–1.9)

## 2011-01-07 LAB — LIPID PANEL
Cholesterol: 124 mg/dL (ref 0–200)
HDL: 45 mg/dL (ref >39.00)
LDL Cholesterol: 59 mg/dL (ref 0–99)
Total CHOL/HDL Ratio: 3
Triglycerides: 101 mg/dL (ref 0.0–149.0)
VLDL: 20.2 mg/dL (ref 0.0–40.0)

## 2011-01-07 LAB — VITAMIN B12: Vitamin B-12: 806 pg/mL (ref 211–911)

## 2011-01-07 LAB — PSA: PSA: 0.28 ng/mL (ref 0.10–4.00)

## 2011-01-07 LAB — TSH: TSH: 1.81 u[IU]/mL (ref 0.35–5.50)

## 2011-01-07 LAB — HEMOGLOBIN A1C: Hgb A1c MFr Bld: 6.3 % (ref 4.6–6.5)

## 2011-01-09 ENCOUNTER — Encounter: Payer: Self-pay | Admitting: Family Medicine

## 2011-01-09 ENCOUNTER — Ambulatory Visit (INDEPENDENT_AMBULATORY_CARE_PROVIDER_SITE_OTHER): Payer: Medicare Other | Admitting: Family Medicine

## 2011-01-09 DIAGNOSIS — N401 Enlarged prostate with lower urinary tract symptoms: Secondary | ICD-10-CM

## 2011-01-09 DIAGNOSIS — I10 Essential (primary) hypertension: Secondary | ICD-10-CM

## 2011-01-09 DIAGNOSIS — E119 Type 2 diabetes mellitus without complications: Secondary | ICD-10-CM

## 2011-01-09 DIAGNOSIS — E785 Hyperlipidemia, unspecified: Secondary | ICD-10-CM

## 2011-01-09 DIAGNOSIS — I2510 Atherosclerotic heart disease of native coronary artery without angina pectoris: Secondary | ICD-10-CM

## 2011-01-09 DIAGNOSIS — Z0000 Encounter for general adult medical examination without abnormal findings: Secondary | ICD-10-CM

## 2011-01-09 MED ORDER — SIMVASTATIN 40 MG PO TABS: 40.0000 mg | ORAL_TABLET | Freq: Every day | ORAL | Status: DC

## 2011-01-09 MED ORDER — METOPROLOL SUCCINATE ER 50 MG PO TB24: 50.0000 mg | ORAL_TABLET | Freq: Every day | ORAL | Status: DC

## 2011-01-09 NOTE — Assessment & Plan Note (Signed)
Great nos. Script written for same dose Simva.

## 2011-01-09 NOTE — Patient Instructions (Addendum)
RTC 3 mos for BP check, Bmet prior  401.9 Discuss Colonoscopy and Zostavax next time.

## 2011-01-09 NOTE — Assessment & Plan Note (Signed)
Risk minimal with curr lipid profile. Stay active.

## 2011-01-09 NOTE — Assessment & Plan Note (Addendum)
Still high even with Dr Donnie Aho having increased the Lisinopril to 20. Will increase Metoprolol to 50 and recheck. BP Readings from Last 3 Encounters:  01/09/11 148/68  08/22/10 140/70  04/04/10 150/80

## 2011-01-09 NOTE — Progress Notes (Signed)
Subjective:    Patient ID: Paul Terrell, male    DOB: [birth year], 77 y.o.   MRN: [id number]  HPI Pt here for Comp Exam. He recently had his eyes checked by Opthalm and wears hearing aids and routinely has hearing evals. He has no complaints and feels well. He saw Dr Donnie Aho within the last month. He had increased his Lisinopril.    Review of Systems  Constitutional: Negative for fever, chills, diaphoresis, appetite change, fatigue and unexpected weight change.  HENT: Negative for hearing loss, ear pain, tinnitus and ear discharge.   Eyes: Negative for pain and discharge. Visual disturbance: Early cataracts.  Respiratory: Negative for cough, shortness of breath and wheezing.   Cardiovascular: Negative for chest pain and palpitations.       Mild  SOB w/ exertion  Gastrointestinal: Negative for nausea, vomiting, abdominal pain, diarrhea, constipation and blood in stool.       No heartburn or swallowing problems.  Genitourinary: Negative for dysuria, frequency and difficulty urinating.       No nocturia  Musculoskeletal: Negative for myalgias and arthralgias. Back pain: with hard work.  Skin: Negative for rash.       No itching or dryness.  Neurological: Negative for tremors and numbness.       No tingling or balance problems.  Hematological: Negative for adenopathy. Bruises/bleeds easily.  Psychiatric/Behavioral: Negative for dysphoric mood and agitation.   No Known Allergies  Current Outpatient Prescriptions on File Prior to Visit  Medication Sig Dispense Refill  . aspirin 81 MG tablet Take 81 mg by mouth daily.        . finasteride (PROSCAR) 5 MG tablet Take 1 tablet (5 mg total) by mouth daily.  90 tablet  3  . glucose blood (ONE TOUCH TEST STRIPS) test strip Use Test once daily Dx 250.00       . glyBURIDE (DIABETA) 5 MG tablet Take 1 tablet (5 mg total) by mouth every morning.  90 tablet  3  . Lancets (ONETOUCH ULTRASOFT) lancets Test once daily Dx 250.00       . metFORMIN  (GLUCOPHAGE) 1000 MG tablet Take 1 tablet (1,000 mg total) by mouth 2 (two) times daily.  180 tablet  3  . Multiple Vitamin (MULTIVITAMIN) tablet Take 1 tablet by mouth daily.        . Tamsulosin HCl (FLOMAX) 0.4 MG CAPS Take 1 capsule (0.4 mg total) by mouth daily.  90 capsule  3  . vitamin B-12 (CYANOCOBALAMIN) 250 MCG tablet Take 250 mcg by mouth 2 (two) times daily.       Marland Kitchen DISCONTD: metoprolol succinate (TOPROL-XL) 25 MG 24 hr tablet Take 1 tablet (25 mg total) by mouth daily.  90 tablet  3  . DISCONTD: simvastatin (ZOCOR) 40 MG tablet Take 40 mg by mouth at bedtime.        Marland Kitchen lisinopril (PRINIVIL,ZESTRIL) 10 MG tablet Take 10 mg by mouth daily.          Past Medical History  Diagnosis Date  . CAD (coronary artery disease) 11/01    stent placed  . Diabetes mellitus type II 11/01  . Hyperlipemia 11/01  . Hypertension 11/01  . Ruptured disc, cervical     Neck C7 (Dr. Hope Pigeon)  . Basal cell carcinoma of scalp 12/16/07    Reexcision (Dr. Jorje Guild)   Family History  Problem Relation Age of Onset  . Stroke Mother     Lived 2 years  . Heart disease Mother  CAD, Angioplasty X 2  . Hypertension Mother   . Heart disease Father     MI  . Hypertension Father   . Hypertension Sister   . Hypertension Brother   . Heart disease Brother     MI  . Hyperlipidemia Brother   . Heart disease Brother     MI          Objective:   Physical Exam  Constitutional: He is oriented to person, place, and time. He appears well-developed and well-nourished. No distress.  HENT:  Head: Normocephalic and atraumatic.  Right Ear: External ear normal.  Left Ear: External ear normal.  Nose: Nose normal.  Mouth/Throat: Oropharynx is clear and moist.  Eyes: Conjunctivae and EOM are normal. Pupils are equal, round, and reactive to light. Right eye exhibits no discharge. Left eye exhibits no discharge. No scleral icterus.  Neck: Normal range of motion. Neck supple. No thyromegaly present.   Cardiovascular: Normal rate, regular rhythm, normal heart sounds and intact distal pulses.   No murmur heard. Pulmonary/Chest: Effort normal and breath sounds normal. No respiratory distress. He has no wheezes.  Abdominal: Soft. Bowel sounds are normal. He exhibits no distension and no mass. There is no tenderness. There is no rebound and no guarding.  Musculoskeletal: Normal range of motion. He exhibits no edema.  Lymphadenopathy:    He has no cervical adenopathy.  Neurological: He is alert and oriented to person, place, and time. Coordination normal.       Monofilament nml.  Skin: Skin is warm and dry. No rash noted. He is not diaphoretic.  Psychiatric: He has a normal mood and affect. His behavior is normal. Judgment and thought content normal.          Assessment & Plan:  Comp Exam  I have personally reviewed the Medicare Annual Wellness questionnaire and have noted 1. The patient's medical and social history 2. Their use of alcohol, tobacco or illicit drugs 3. Their current medications and supplements 4. The patient's functional ability including ADL's, fall risks, home safety risks and hearing or visual             impairment. 5. Diet and physical activities 6. Evidence for depression or mood disorders

## 2011-01-09 NOTE — Assessment & Plan Note (Signed)
Stable with reasonable control. Eye exam UTD. Monofilament NML. Microalb NML. Cont curr meds and lifestyle.

## 2011-01-09 NOTE — Assessment & Plan Note (Signed)
Stable. Starting to have some hesitancy again. Will need to see Urology eventually.

## 2011-02-08 NOTE — Cardiovascular Report (Signed)
[place]  Patient:    Paul Terrell, Paul Terrell                          MRN: [id number] Proc. Date: 07/17/00 Adm. Date:  [id number] Attending:  Norman Clay CC:         Georg Ruddle. Viviann Spare, M.D.   Cardiac Catheterization  INDICATIONS:  Progressive angina despite medical therapy in a patient with an abnormal Cardiolite and multiple risk factors.  The patient was brought to the cath lab and was prepped and draped in the usual fashion.  After Xylocaine anesthesia, a 6 French sheath was placed in the right femoral artery percutaneously.  Angiograms were made using 6 French catheters and a 30 cc ventriculogram was performed.  He was found to have a severe stenosis in the mid right coronary artery.  Arrangements were made to do angioplasty.  The sheaths were exchanged for a 7 French sheath and angioplasty equipment used was a JR4 7 [place] catheter, HTF guide wire short, and a 2.5 crossail balloon x 15 mm was used for predilatation.  The patient received an injection of 4500 units of heparin.  A second IV was begun and IV nitroglycerin was begun.  Integrilin was begun with a double bolus and constant infusion.  The guide wire crossed the lesion without difficulty and the lesion was predilated with a 2.5 x 15 mm crossail.  A 5.0 x 13 mm Multilink Ultra balloon stent was deployed at 13 atmospheres.  The balloon was positioned well, slipped forward slightly during inflation, but was thought to have adequate coverage of the lesion.  Postdilatation angiograms were made. The system was removed using a guide wire and the patient was returned to the angioplasty care center in stable condition.  HEMODYNAMIC DATA:  Aorta postcontrast 131/56, LV postcontrast 131/16.  ANGIOGRAPHIC DATA:  Left ventriculogram performed in the 30 degree RAO projection.  The aortic valve was normal.  The mitral valve was normal.  The left ventricle appears normal in size.  The  estimated ejection fraction is 60%.  Coronary arteries arise and distribute normally.  Mild calcification is present.  The left main coronary artery appears normal.  The left anterior descending has a 30 to 40% proximal stenosis.  A small diagonal branch has a severe 90% stenosis at its origin, but is too small for angioplasty.  There is a segmental 40 to 50% stenosis in the  midportion of the vessel.  The circumflex coronary artery has a large intermediate branch which is free of disease.  The continuation branch of the circumflex contains scattered irregularities.  The right coronary artery has an eccentric severe 90% focal stenosis in the midvessel.  The vessel is a very large vessel.  Postdilatation angiograms of the right coronary artery revealed 0% residual stenosis with no dissection and preservation of all side branches.  IMPRESSION: 1. Successful stenting of the mid right coronary artery with stenosis going    from 90% to 0%. 2. Residual moderate atherosclerotic disease involving the LAD and diagonal    branch. 3. Normal left ventricular function. DD:  07/17/00 TD:  07/17/00 Job: [id number]

## 2011-02-08 NOTE — Op Note (Signed)
NAME:  Paul Terrell, Paul Terrell                             ACCOUNT NO.:  [id number]   MEDICAL RECORD NO.:  [id number]                   PATIENT TYPE:  AMB   LOCATION:  [facility]                                  FACILITY:  [facility]   PHYSICIAN:  Claude Manges. Cleophas Dunker, M.D.            DATE OF BIRTH:  26-Jun-1931   DATE OF PROCEDURE:  11/03/2003  DATE OF DISCHARGE:                                 OPERATIVE REPORT   PREOPERATIVE DIAGNOSIS:  1. Rotator cuff tear with impingement, left shoulder.  2. Degenerative joint disease acromioclavicular joint, left shoulder.   POSTOPERATIVE DIAGNOSIS:  1. Rotator cuff tear with impingement, left shoulder.  2. Degenerative joint disease acromioclavicular joint, left shoulder.   PROCEDURE:  1. Arthroscopic subacromial decompression.  2. Arthroscopic distal clavicle resection.  3. Mini-open rotator cuff tear repair.   SURGEON:  Claude Manges. Cleophas Dunker, M.D.   ASSISTANT:  Legrand Pitts. Duffy, P.A.-C.   ANESTHESIA:  General orotracheal with supplemental interscalene nerve block.   COMPLICATIONS:  None.   HISTORY:  77 year old gentleman has been having trouble with bilateral  shoulder pain, left greater than right, for some time.  He has had  difficulty with overhead motion, even sleeping on that side.  He has had  cortisone injections and continues to have discomfort to the point of  compromise.  He has had an MRI scan that revealed a 1.5 cm full thickness  tear along the anterior leading edge of the supraspinatus tendon and  moderate tendinopathy of the distal supraspinatus tendon without a tear.  He  also has a laterally pointed acromion and hypertrophic changes of the [facility]  joint.  He was found to have rotator cuff tear.   PROCEDURE:  With the patient comfortable on the operating table and under  general orotracheal anesthesia with a supplemental interscalene nerve block,  the patient was placed in the semi-sitting position with a shoulder frame.  The left shoulder was  prepped with DuraPrep from the base of the neck  circumferentially to below the elbow.  Sterile draping was performed.  A  marking pen was used to outline the acromion, the [facility] joint, and the  coracoid.  At a point a fingerbreadth inferior and medial to the posterior  angle of the acromion, a small stab wound was made prior to which 0.25%  Marcaine with epinephrine was injected.  The arthroscope was easily placed  into the shoulder joint.   Arthroscopy revealed evidence of a tear of the rotator cuff, full thickness  of the anterior portion of the supraspinatus.  There was minimal  degenerative changes, some fraying but no particular fraying of the biceps  tendon or the anterior glenoid labrum or loose bodies.  The arthroscope was  then placed in the subacromial space posteriorly, second and third portals  were established in the anterior subacromial spaces.  A subacromial  decompression was performed with the shaver and the ArthroCare  Wand, there  was a moderate amount of bursa tissue which was resected.  There was obvious  over hang of the acromion anteriorly and laterally and a 6 mm bur was used  to remove the anterior inferior acromioplasty with a very nice resection.  I  also was able to perform a distal clavicle resection using the arthroscope  also with a very nice resection.  There was inferior hanging spurs from the  distal clavicle.  The rotator cuff tear was obviously visible through the  bursal surface of the cuff.   At this point, a mini-open repair of the rotator cuff was performed, at the  junction of the anterolateral aspect of the shoulder, about 1 1/2 inch  incision was made from the inferior acromion distally by sharp dissection.  The incision was carried down through the subcutaneous tissue.  A self-  retaining retractor was inserted.  A raphe of the deltoid was identified and  incised and then by blunt dissection, separated off the rotator cuff.  The  self-retaining  retractor was placed more deeply.  The rotator cuff tear was  obvious.  It was about 1.5 cm along the distal aspect of the supraspinatus  tendon and the humeral head.  The edges were sharply resected so there was  good tissue.  One single Mitek anchor was inserted into the bone.  Repair  was performed with supplemental 0 Ethibond at each edge.  I had a very nice  repair, there was no evidence of any further impingement.   The wound was irrigated with saline solution.  The deltoid fascia was closed  with a running 0 Vicryl, the subcu with 2-0 Vicryl, and the skin with skin  clips as well as the puncture sites for the arthroscopy.  A sterile, bulky  dressing was applied followed by a sling.  The patient tolerated the  procedure well without complications.   PLAN:  Recovery care, discharge in the a.m. on Percocet.                                               Claude Manges. Cleophas Dunker, M.D.    PWW/MEDQ  D:  11/03/2003  T:  11/03/2003  Job:  [id number]

## 2011-02-08 NOTE — Discharge Summary (Signed)
[place]. [facility]  Patient:    Paul Terrell, Paul Terrell                          MRN: [id number] Adm. Date:  [id number] Disc. Date: 07/18/00 Attending:  Norman Clay CC:         Georg Ruddle. Viviann Spare, M.D.   Discharge Summary  DISCHARGE DIAGNOSES: 1. Coronary artery disease with angina pectoris:    a. Stenting of the right coronary artery with the 5.0 mm x 13.0 mm       Ultra stent.    b. Residual atherosclerosis of a moderate degree involving the left       anterior descending coronary artery, severe 90% small first diagonal       branch, not large enough for angioplasty or stenting. 2. Diabetes mellitus type 2. 3. Hypertension, under treatment.  PROCEDURE:  Cardiac catheterization, angioplasty, and stenting.  HISTORY OF PRESENT ILLNESS:  This 77 year old male who has had angina has an abnormal Cardiolite scan.  He has known diabetes mellitus and hypertension. He was brought in for an elective cardiac catheterization.  Please see the previously dictated history and physical for the remainder of the details.  HOSPITAL COURSE:  PT 12.2, PTT 25.9.  Hemoglobin 14.6, hematocrit 41.7. Chemistry panel shows sodium 135, potassium 3.9, chloride 101, CO2 of 26, glucose 166, BUN 15, creatinine 1.1.  The patient was brought in and had a same-day cardiac catheterization.  The circumflex showed no significant disease.  The left anterior descending coronary artery had a proximal 30% stenosis and a 50% mid-vessel stenosis noted.  There was a severe 90% small diagonal branch which was less than 1.0 mm.  The right coronary artery was a large dominant vessel and had a severe 90% stenosis in its midportion.  He underwent an angioplasty and stenting of this with a 13.0 mm x 5.0 mm Multi-Link Ultra stent.  He tolerated this part of the procedure well.  He had a slight vagal reaction with the sheath pull. He received Integrilin for 18 hours, with double bolus technique.   He was stable the next morning with normal CPK and normal electrocardiogram.  DISPOSITION:  He was discharged in an improved condition.  DISCHARGE MEDICATIONS: 1. Aspirin 325 mg q.d. 2. Plavix 75 mg q.d. 3. Zestril 10 mg q.d. 4. Toprol XL 25 mg q.d. 5. Glyburide 5 mg q.d. 6. Nitroglycerin p.r.n.  INSTRUCTIONS:  He is to walk daily and is to see me in followup in one week. He is to call if there are problems.DD:  07/18/00 TD:  07/18/00 Job: [id number]

## 2011-04-08 ENCOUNTER — Other Ambulatory Visit (INDEPENDENT_AMBULATORY_CARE_PROVIDER_SITE_OTHER): Payer: Medicare Other | Admitting: Family Medicine

## 2011-04-08 DIAGNOSIS — I10 Essential (primary) hypertension: Secondary | ICD-10-CM

## 2011-04-08 LAB — BASIC METABOLIC PANEL
BUN: 19 mg/dL (ref 6–23)
CO2: 28 mEq/L (ref 19–32)
Calcium: 8.8 mg/dL (ref 8.4–10.5)
Chloride: 99 mEq/L (ref 96–112)
Creatinine, Ser: 0.8 mg/dL (ref 0.4–1.5)
GFR: 95.23 mL/min (ref >60.00)
Glucose, Bld: 132 mg/dL — ABNORMAL HIGH (ref 70–99)
Potassium: 4.9 mEq/L (ref 3.5–5.1)
Sodium: 134 mEq/L — ABNORMAL LOW (ref 135–145)

## 2011-04-17 ENCOUNTER — Encounter: Payer: Self-pay | Admitting: Family Medicine

## 2011-04-17 ENCOUNTER — Ambulatory Visit (INDEPENDENT_AMBULATORY_CARE_PROVIDER_SITE_OTHER): Payer: Medicare Other | Admitting: Family Medicine

## 2011-04-17 DIAGNOSIS — Z0000 Encounter for general adult medical examination without abnormal findings: Secondary | ICD-10-CM

## 2011-04-17 DIAGNOSIS — I10 Essential (primary) hypertension: Secondary | ICD-10-CM

## 2011-04-17 DIAGNOSIS — E119 Type 2 diabetes mellitus without complications: Secondary | ICD-10-CM

## 2011-04-17 NOTE — Assessment & Plan Note (Signed)
Will refer for colonoscopy. Discussed Zostavax. He will look into insurance coverage and pharmacy of choice.

## 2011-04-17 NOTE — Assessment & Plan Note (Signed)
Acute number mildly elevated on Bmet check for meds. Be careful to avoid sweets and carbs.

## 2011-04-17 NOTE — Patient Instructions (Signed)
Refer for colonoscopy.  Look into Zostavax (shingles shot). Call early for next PE as will need to establish with new doctor.

## 2011-04-17 NOTE — Progress Notes (Signed)
  Subjective:    Patient ID: Paul Terrell, male    DOB: 1931-02-01, 77 y.o.   MRN: [id number]  HPI Pt is here for three month recheck. When last seen he had just had his Lisinopril increased by Dr Donnie Aho but his BP was still high so I increased his Metoprolol to 50mg .  He has no complaints today and feels well. He has had a small ulcer on the right lower leg for which he saw a Dernmatologist and was given some medication which helped it heal. He also had a mole removed from his abd which he has not heard yet.  He is due a colonoscopy.    Review of Systems  Constitutional: Negative for fever, chills, diaphoresis, activity change, appetite change and fatigue.  HENT: Negative for hearing loss, ear pain, congestion, sore throat, rhinorrhea, neck pain, neck stiffness, postnasal drip, sinus pressure, tinnitus and ear discharge.   Eyes: Negative for pain, discharge and visual disturbance.  Respiratory: Negative for cough, shortness of breath and wheezing.   Cardiovascular: Negative for chest pain and palpitations.       No SOB w/ exertion  Gastrointestinal:       No heartburn or swallowing problems.  Genitourinary:       No nocturia  Skin:       No itching or dryness.  Neurological:       No tingling or balance problems.  All other systems reviewed and are negative.       Objective:   Physical Exam  Constitutional: He appears well-developed and well-nourished. No distress.  HENT:  Head: Normocephalic and atraumatic.  Right Ear: External ear normal.  Left Ear: External ear normal.  Nose: Nose normal.  Mouth/Throat: Oropharynx is clear and moist.  Eyes: Conjunctivae and EOM are normal. Pupils are equal, round, and reactive to light. Right eye exhibits no discharge. Left eye exhibits no discharge.  Neck: Normal range of motion. Neck supple.  Cardiovascular: Normal rate and regular rhythm.   Pulmonary/Chest: Effort normal and breath sounds normal. He has no wheezes.  Lymphadenopathy:   He has no cervical adenopathy.  Neurological:       Monofilament nml.  Skin: He is not diaphoretic.          Assessment & Plan:

## 2011-04-17 NOTE — Assessment & Plan Note (Signed)
Good control. Cont curr medication. BP Readings from Last 3 Encounters:  04/17/11 116/64  01/09/11 148/68  08/22/10 140/70

## 2011-05-15 ENCOUNTER — Other Ambulatory Visit: Payer: Self-pay | Admitting: *Deleted

## 2011-05-15 MED ORDER — ZOSTER VACCINE LIVE 19400 UNT/0.65ML ~~LOC~~ SOLR: 0.6500 mL | Freq: Once | SUBCUTANEOUS | Status: DC

## 2011-05-15 NOTE — Telephone Encounter (Signed)
Rx called to pharmacy as instructed. 

## 2011-05-15 NOTE — Telephone Encounter (Signed)
Pt would like to get zostavax at [facility], he has checked with his insurance. Please send order.

## 2011-05-20 ENCOUNTER — Other Ambulatory Visit: Payer: Self-pay | Admitting: Family Medicine

## 2011-05-20 NOTE — Telephone Encounter (Signed)
Pt came to the office, need Rx for the Shingles Vaccine to be sent to [facility]  Pt request call back when Rx prescription ready......[facility]

## 2011-05-22 MED ORDER — ZOSTER VACCINE LIVE 19400 UNT/0.65ML ~~LOC~~ SOLR: 0.6500 mL | Freq: Once | SUBCUTANEOUS | Status: DC

## 2011-05-22 NOTE — Telephone Encounter (Signed)
Please verify medication to be dispensed by pharmacy.

## 2011-05-22 NOTE — Telephone Encounter (Signed)
Patient notified that rx has been sent to the pharmacy. 

## 2011-06-05 ENCOUNTER — Ambulatory Visit (AMBULATORY_SURGERY_CENTER): Payer: Medicare Other | Admitting: *Deleted

## 2011-06-05 VITALS — Ht 68.0 in | Wt 179.0 lb

## 2011-06-05 DIAGNOSIS — Z1211 Encounter for screening for malignant neoplasm of colon: Secondary | ICD-10-CM

## 2011-06-05 MED ORDER — SUPREP BOWEL PREP KIT 17.5-3.13-1.6 GM/177ML PO SOLN: ORAL | Status: DC

## 2011-06-19 ENCOUNTER — Encounter: Payer: Self-pay | Admitting: Gastroenterology

## 2011-06-19 ENCOUNTER — Ambulatory Visit (AMBULATORY_SURGERY_CENTER): Payer: Medicare Other | Admitting: Gastroenterology

## 2011-06-19 VITALS — BP 160/77 | HR 60 | Temp 97.2°F | Resp 20 | Ht 68.0 in | Wt 179.0 lb

## 2011-06-19 DIAGNOSIS — K5730 Diverticulosis of large intestine without perforation or abscess without bleeding: Secondary | ICD-10-CM

## 2011-06-19 DIAGNOSIS — Z1211 Encounter for screening for malignant neoplasm of colon: Secondary | ICD-10-CM

## 2011-06-19 LAB — GLUCOSE, CAPILLARY
Glucose-Capillary: 137 mg/dL — ABNORMAL HIGH (ref 70–99)
Glucose-Capillary: 135 mg/dL — ABNORMAL HIGH (ref 70–99)

## 2011-06-19 MED ORDER — SODIUM CHLORIDE 0.9 % IV SOLN: 500.0000 mL | INTRAVENOUS | Status: DC

## 2011-06-20 ENCOUNTER — Telehealth: Payer: Self-pay

## 2011-06-20 NOTE — Telephone Encounter (Signed)
No answer. Phone rang over twenty times.

## 2011-10-08 DIAGNOSIS — I2510 Atherosclerotic heart disease of native coronary artery without angina pectoris: Secondary | ICD-10-CM | POA: Diagnosis not present

## 2011-10-08 DIAGNOSIS — I10 Essential (primary) hypertension: Secondary | ICD-10-CM | POA: Diagnosis not present

## 2011-10-08 DIAGNOSIS — E785 Hyperlipidemia, unspecified: Secondary | ICD-10-CM | POA: Diagnosis not present

## 2011-10-20 ENCOUNTER — Telehealth: Payer: Self-pay | Admitting: Family Medicine

## 2011-10-20 DIAGNOSIS — I10 Essential (primary) hypertension: Secondary | ICD-10-CM

## 2011-10-20 NOTE — Telephone Encounter (Signed)
BP meds changed by cards.  Needs to f/u for BMET and then OV for BP check.  Thanks.

## 2011-10-22 NOTE — Telephone Encounter (Signed)
LMOVM to return call to schedule lab and OV.

## 2011-10-28 NOTE — Telephone Encounter (Signed)
Spoke with patient on mobile number.  He says he will call back for the lab and OV appt and try to get in some time next week.

## 2011-11-08 ENCOUNTER — Other Ambulatory Visit (INDEPENDENT_AMBULATORY_CARE_PROVIDER_SITE_OTHER): Payer: Medicare Other

## 2011-11-08 DIAGNOSIS — I10 Essential (primary) hypertension: Secondary | ICD-10-CM | POA: Diagnosis not present

## 2011-11-08 LAB — BASIC METABOLIC PANEL
BUN: 15 mg/dL (ref 6–23)
CO2: 30 mEq/L (ref 19–32)
Calcium: 9.2 mg/dL (ref 8.4–10.5)
Chloride: 102 mEq/L (ref 96–112)
Creatinine, Ser: 0.8 mg/dL (ref 0.4–1.5)
GFR: 93.78 mL/min (ref >60.00)
Glucose, Bld: 139 mg/dL — ABNORMAL HIGH (ref 70–99)
Potassium: 4.6 mEq/L (ref 3.5–5.1)
Sodium: 139 mEq/L (ref 135–145)

## 2011-11-15 ENCOUNTER — Encounter: Payer: Self-pay | Admitting: Family Medicine

## 2011-11-15 ENCOUNTER — Ambulatory Visit (INDEPENDENT_AMBULATORY_CARE_PROVIDER_SITE_OTHER): Payer: Medicare Other | Admitting: Family Medicine

## 2011-11-15 DIAGNOSIS — E119 Type 2 diabetes mellitus without complications: Secondary | ICD-10-CM | POA: Diagnosis not present

## 2011-11-15 DIAGNOSIS — N400 Enlarged prostate without lower urinary tract symptoms: Secondary | ICD-10-CM

## 2011-11-15 DIAGNOSIS — I10 Essential (primary) hypertension: Secondary | ICD-10-CM

## 2011-11-15 NOTE — Progress Notes (Signed)
Hypertension:    Using medication without problems or lightheadedness: yes Chest pain with exertion:no Edema:no Short of breath:no BP meds adjusted prev. labs okay.  D/w pt.   Meds, vitals, and allergies reviewed.   PMH and SH reviewed  ROS: See HPI.  Otherwise negative.    GEN: nad, alert and oriented HEENT: mucous membranes moist NECK: supple w/o LA CV: rrr. PULM: ctab, no inc wob ABD: soft, +bs EXT: no edema SKIN: no acute rash

## 2011-11-15 NOTE — Patient Instructions (Signed)
Come back for fasting labs in April or May and then get a appointment a few days after the labs.   Glad to see you.  Take care.

## 2011-11-18 MED ORDER — FINASTERIDE 5 MG PO TABS: 5.0000 mg | ORAL_TABLET | Freq: Every day | ORAL | Status: DC

## 2011-11-18 MED ORDER — TAMSULOSIN HCL 0.4 MG PO CAPS: 0.4000 mg | ORAL_CAPSULE | Freq: Every day | ORAL | Status: DC

## 2011-11-18 MED ORDER — SIMVASTATIN 40 MG PO TABS: 40.0000 mg | ORAL_TABLET | Freq: Every day | ORAL | Status: DC

## 2011-11-18 MED ORDER — METFORMIN HCL 1000 MG PO TABS: 1000.0000 mg | ORAL_TABLET | Freq: Two times a day (BID) | ORAL | Status: DC

## 2011-11-18 MED ORDER — METOPROLOL SUCCINATE ER 25 MG PO TB24: 25.0000 mg | ORAL_TABLET | Freq: Every day | ORAL | Status: DC

## 2011-11-18 NOTE — Assessment & Plan Note (Signed)
Improved, no further changes in meds, continue as is. He agrees. Recheck labs later in 2013.

## 2011-11-22 DIAGNOSIS — L905 Scar conditions and fibrosis of skin: Secondary | ICD-10-CM | POA: Diagnosis not present

## 2011-11-22 DIAGNOSIS — L570 Actinic keratosis: Secondary | ICD-10-CM | POA: Diagnosis not present

## 2011-11-25 ENCOUNTER — Telehealth: Payer: Self-pay | Admitting: *Deleted

## 2011-11-25 NOTE — Telephone Encounter (Signed)
Received a message on voicemail from patients spouse [name]) stating that [name] received two different doses of Metoprolol from [facility] pharmacy.  She called to verify the dose that [name] is suppose to be on.  I called and left a message on machine at home per [facility] request that [name] is suppose to be on 25mg  of Metoprolol.  Advised her to call back if she has any further questions.

## 2011-11-26 ENCOUNTER — Other Ambulatory Visit: Payer: Self-pay | Admitting: *Deleted

## 2011-11-26 ENCOUNTER — Telehealth: Payer: Self-pay | Admitting: *Deleted

## 2011-11-26 MED ORDER — GLYBURIDE 5 MG PO TABS: 5.0000 mg | ORAL_TABLET | ORAL | Status: DC

## 2011-11-26 NOTE — Telephone Encounter (Signed)
Patient's wife is calling back stating that she appreciates the call back yesterday regarding his Metoprolol. Patient's wife stated that his Metoprolol back in November was filled for 50 mg and she wants to know why it is now 25 mg? Mrs. Whitter is curious why the change? See office notes dated 01/09/11. Please advise.

## 2011-11-26 NOTE — Telephone Encounter (Signed)
Noted, thanks!

## 2011-11-26 NOTE — Telephone Encounter (Signed)
Spoke with patients spouse and she stated that for as long as she can remember Jareb has always been on 50mg  of Toprol XL.  She stated that she will go home and check her records and call us back if she is incorrect.  Medication list updated in epic.

## 2011-11-26 NOTE — Telephone Encounter (Signed)
I thought that patient was back on 25mg  of toprol a day.  Please clarify his dose, ie what he was taking at his visit in 2/13.  I would continue that dose from that point. Please let me know if I can be of service.  I only sent the 25mg  rx because I thought that was his dose.  Please adjust the med list if needed.

## 2011-11-27 NOTE — Telephone Encounter (Signed)
Noted, I'll await return call with info.

## 2011-11-27 NOTE — Telephone Encounter (Signed)
Spoke to patient's wife and was advised that he has not had his BP and pulse checked. Mrs. Cassaday stated that she does recall when he started on the 50 mg of the medication he was a little dizzy for a while. Patient's wife stated that she will have his BP and pulse checked at the fire department close to their home and will call back with this information.

## 2011-11-27 NOTE — Telephone Encounter (Signed)
Patients spouse [name]) called back stating that after she checked this records he has been on Toprol XL 25mg  since 2012.  He has been taking 50mg  for about 3-4 weeks now.  She wants to know if he should continue the 50mg  tablets or go back to the 25mg  tablets?  Please advise.

## 2011-11-27 NOTE — Telephone Encounter (Signed)
What is the typical range for BP and pulse now?  Let me know.

## 2011-12-12 ENCOUNTER — Telehealth: Payer: Self-pay | Admitting: Family Medicine

## 2011-12-12 NOTE — Telephone Encounter (Signed)
Caller: Mildred/Spouse; PCP: Crawford Givens Clelia Croft); CB#: [phone]; Call regarding HTN; Pt was prescribed Toprol 25 mg 1 q d. The pharmacy filled the rx for Toprol 50 mg q d x 3 wks ago. He is still on Toprol 50 mg but his BP was 160/84 at the [employer] today. [name] question if he needs to cont the Toprol 50 mg q d instead of Toprol 25mg  q d. Homecare per HTN Protocol. EPIC shows Toprol 50 mg q d. OFC NOTE: PLS CALL MILDRED TO CLARIFY THE DOSE.

## 2011-12-12 NOTE — Telephone Encounter (Signed)
I called and talked to his wife.  His BP had been checked.  Mild inc in SBP but DBP had been 60-80s.  I wouldn't change meds and induce hypotension.  Will continue 50mg  toprol xl a day.  EMR med list is correct.

## 2011-12-13 ENCOUNTER — Telehealth: Payer: Self-pay | Admitting: Family Medicine

## 2011-12-13 NOTE — Telephone Encounter (Signed)
See note from yesterday

## 2011-12-13 NOTE — Telephone Encounter (Signed)
Triage Record Num: [id number] Operator: Freddie Breech Patient Name: Paul Terrell Call Date & Time: 12/12/2011 3:47:59PM Patient Phone: [phone] PCP: Crawford Givens Patient Gender: Male PCP Fax : Patient DOB: 04/04/1933 Practice Name: [facility] Reason for Call: Caller: Mildred/Spouse; PCP: Crawford Givens Clelia Croft); CB#: [phone]; Call regarding HTN; Pt was prescribed Toprol 25 mg 1 q d. The pharmacy filled the rx for Toprol 50 mg q d x 3 wks ago. He is still on Toprol 50 mg but his BP was 160/84 at the [employer] today. [name] question if he needs to cont the Toprol 50 mg q d instead of Toprol 25mg  q d. Homecare per HTN Protocol. EPIC shows Toprol 50 mg q d. OFC NOTE: PLS CALL [facility] TO CLARIFY THE DOSE. Protocol(s) Used: Hypertension, Diagnosed or Suspected Recommended Outcome per Protocol: Provide Home/Self Care Override Outcome if Used in Protocol: Information Noted and Sent to Programme researcher, broadcasting/film/video Reason for Override Outcome: Office Is Open. Reason for Outcome: Requesting information about hypertension Care Advice: It is important to have blood pressure checked at least annually, or at each provider visit, especially if you have a history of high blood pressure in your family. ~ LIFESTYLE MODIFICATION FOR HYPERTENSION: - Weight reduction will help lower blood pressure in individuals who are 10% or more above their ideal body weight. - Eat foods low in saturated fat and sugar, and high in complex carbohydrates (vegetables, fruits, whole grains). - Drink alcohol only in moderate amounts (12 oz beer, 5 oz wine, or 1 1/2 oz of distilled alcohol such as vodka, gin, etc.) and not more than 2 drinks/day for men or 1 drink/day for women or lighter-weight persons. - Get at least 30 minutes of moderate aerobic exercise (using as much energy as walking 2 miles in 30 minutes) most days of the week, preferably daily. - Stop smoking to decrease risk for cardiovascular and pulmonary disease,  as well as cancer. - Keep regularly scheduled appointments with your provider. ~ 12/12/2011 4:02:35PM Page 1 of 1 [email or web address]

## 2012-01-14 ENCOUNTER — Other Ambulatory Visit: Payer: Self-pay | Admitting: *Deleted

## 2012-01-14 ENCOUNTER — Telehealth: Payer: Self-pay | Admitting: *Deleted

## 2012-01-14 MED ORDER — METFORMIN HCL 1000 MG PO TABS: 1000.0000 mg | ORAL_TABLET | Freq: Two times a day (BID) | ORAL | Status: DC

## 2012-01-14 MED ORDER — TAMSULOSIN HCL 0.4 MG PO CAPS: 0.4000 mg | ORAL_CAPSULE | Freq: Every day | ORAL | Status: DC

## 2012-01-14 MED ORDER — SIMVASTATIN 40 MG PO TABS: 40.0000 mg | ORAL_TABLET | Freq: Every day | ORAL | Status: DC

## 2012-01-14 MED ORDER — METOPROLOL SUCCINATE ER 50 MG PO TB24: 50.0000 mg | ORAL_TABLET | Freq: Every day | ORAL | Status: DC

## 2012-01-14 MED ORDER — FINASTERIDE 5 MG PO TABS: 5.0000 mg | ORAL_TABLET | Freq: Every day | ORAL | Status: DC

## 2012-01-14 NOTE — Telephone Encounter (Signed)
rx

## 2012-01-20 ENCOUNTER — Other Ambulatory Visit: Payer: Self-pay | Admitting: Family Medicine

## 2012-02-05 ENCOUNTER — Other Ambulatory Visit (INDEPENDENT_AMBULATORY_CARE_PROVIDER_SITE_OTHER): Payer: Medicare Other

## 2012-02-05 DIAGNOSIS — N400 Enlarged prostate without lower urinary tract symptoms: Secondary | ICD-10-CM | POA: Diagnosis not present

## 2012-02-05 DIAGNOSIS — N138 Other obstructive and reflux uropathy: Secondary | ICD-10-CM | POA: Diagnosis not present

## 2012-02-05 DIAGNOSIS — E119 Type 2 diabetes mellitus without complications: Secondary | ICD-10-CM | POA: Diagnosis not present

## 2012-02-05 DIAGNOSIS — N401 Enlarged prostate with lower urinary tract symptoms: Secondary | ICD-10-CM | POA: Diagnosis not present

## 2012-02-05 LAB — HEPATIC FUNCTION PANEL
ALT: 24 U/L (ref 0–53)
AST: 26 U/L (ref 0–37)
Albumin: 4 g/dL (ref 3.5–5.2)
Alkaline Phosphatase: 49 U/L (ref 39–117)
Bilirubin, Direct: 0 mg/dL (ref 0.0–0.3)
Total Bilirubin: 0.7 mg/dL (ref 0.3–1.2)
Total Protein: 6.5 g/dL (ref 6.0–8.3)

## 2012-02-05 LAB — LIPID PANEL
Cholesterol: 127 mg/dL (ref 0–200)
HDL: 44.9 mg/dL (ref >39.00)
LDL Cholesterol: 66 mg/dL (ref 0–99)
Total CHOL/HDL Ratio: 3
Triglycerides: 79 mg/dL (ref 0.0–149.0)
VLDL: 15.8 mg/dL (ref 0.0–40.0)

## 2012-02-05 LAB — HEMOGLOBIN A1C: Hgb A1c MFr Bld: 6.1 % (ref 4.6–6.5)

## 2012-02-06 LAB — PSA, MEDICARE: PSA: 0.39 ng/mL (ref <=4.00)

## 2012-02-13 ENCOUNTER — Ambulatory Visit (INDEPENDENT_AMBULATORY_CARE_PROVIDER_SITE_OTHER): Payer: Medicare Other | Admitting: Family Medicine

## 2012-02-13 ENCOUNTER — Ambulatory Visit (INDEPENDENT_AMBULATORY_CARE_PROVIDER_SITE_OTHER)
Admission: RE | Admit: 2012-02-13 | Discharge: 2012-02-13 | Disposition: A | Discharge status: Home / Self Care | Payer: Medicare Other | Source: Ambulatory Visit | Attending: Family Medicine | Admitting: Family Medicine

## 2012-02-13 ENCOUNTER — Encounter: Payer: Self-pay | Admitting: Family Medicine

## 2012-02-13 VITALS — BP 126/62 | HR 62 | Temp 97.7°F | Wt 177.0 lb

## 2012-02-13 DIAGNOSIS — E119 Type 2 diabetes mellitus without complications: Secondary | ICD-10-CM

## 2012-02-13 DIAGNOSIS — I2510 Atherosclerotic heart disease of native coronary artery without angina pectoris: Secondary | ICD-10-CM | POA: Diagnosis not present

## 2012-02-13 DIAGNOSIS — E785 Hyperlipidemia, unspecified: Secondary | ICD-10-CM | POA: Diagnosis not present

## 2012-02-13 DIAGNOSIS — E1149 Type 2 diabetes mellitus with other diabetic neurological complication: Secondary | ICD-10-CM

## 2012-02-13 DIAGNOSIS — E1142 Type 2 diabetes mellitus with diabetic polyneuropathy: Secondary | ICD-10-CM

## 2012-02-13 DIAGNOSIS — M25519 Pain in unspecified shoulder: Secondary | ICD-10-CM | POA: Diagnosis not present

## 2012-02-13 DIAGNOSIS — M19019 Primary osteoarthritis, unspecified shoulder: Secondary | ICD-10-CM | POA: Diagnosis not present

## 2012-02-13 DIAGNOSIS — I10 Essential (primary) hypertension: Secondary | ICD-10-CM

## 2012-02-13 DIAGNOSIS — N401 Enlarged prostate with lower urinary tract symptoms: Secondary | ICD-10-CM

## 2012-02-13 MED ORDER — METOPROLOL SUCCINATE ER 50 MG PO TB24: 50.0000 mg | ORAL_TABLET | Freq: Every day | ORAL | Status: DC

## 2012-02-13 MED ORDER — TAMSULOSIN HCL 0.4 MG PO CAPS: 0.4000 mg | ORAL_CAPSULE | Freq: Every day | ORAL | Status: DC

## 2012-02-13 MED ORDER — SIMVASTATIN 40 MG PO TABS: 40.0000 mg | ORAL_TABLET | Freq: Every day | ORAL | Status: DC

## 2012-02-13 NOTE — Assessment & Plan Note (Signed)
Controlled, continue current meds.   

## 2012-02-13 NOTE — Progress Notes (Signed)
H/o ruptured disc, C7, now with R shoulder and arm pain.  Going on about 1 month.  He has some neck pain likely from compensation that is better with repositioning.  Worse trying to sleep, supine.  No new/asymmetric weakness.  H/o dec in sensation in R hand after a laceration in distant past.  Pain with int and ext rotation.  Pain washing his hair, reaching up.  ROM limited reaching up.    Diabetes:  Using medications without difficulties: yes Hypoglycemic episodes:no Hyperglycemic episodes:no Feet problems:no Blood Sugars averaging: ~110 eye exam within last year:yes A1c controlled.   Hypertension:    Using medication without problems or lightheadedness: yes Chest pain with exertion:no Edema:no Short of breath: minimal, at baseline  Elevated Cholesterol: Using medications without problems: yes Muscle aches: no Diet compliance: yes Exercise: some, "I'm working and active" but nothing scheduled.   PMH and SH reviewed.   Vital signs, Meds and allergies reviewed.  ROS: See HPI.  Otherwise nontributory.   GEN: nad, alert and oriented HEENT: mucous membranes moist NECK: supple w/o LA CV: rrr PULM: ctab, no inc wob ABD: soft, +bs EXT: no edema SKIN: no acute rash R shoulder: Pain with int and ext rotation.  + impingement.  Pain reaching up.  ROM limited reaching up.  GH joint feels stable.  Spurling neg   Diabetic foot exam: Normal inspection No skin breakdown No calluses  Normal DP pulses Dec in sensation to light tough and monofilament on plantar side of feet B Nails normal

## 2012-02-13 NOTE — Patient Instructions (Addendum)
Go to the lab on the way out.  We'll contact you with your xray report. See Shirlee Limerick about your referral before you leave today. Recheck A1c in 6 months and then come see me.  Don't change your meds for now.    Use the range of motion exercises we talked about in the meantime.

## 2012-02-13 NOTE — Assessment & Plan Note (Signed)
Likely cuff symptoms.  I didn't inject due to DM2.  Check plain films today and refer to ortho.  D/w pt about exercises for ROM.  I think his prev neck pain is due to compensation and not true cervical pathology.

## 2012-02-19 DIAGNOSIS — M19019 Primary osteoarthritis, unspecified shoulder: Secondary | ICD-10-CM | POA: Diagnosis not present

## 2012-02-21 DIAGNOSIS — M75120 Complete rotator cuff tear or rupture of unspecified shoulder, not specified as traumatic: Secondary | ICD-10-CM | POA: Diagnosis not present

## 2012-02-21 DIAGNOSIS — M25519 Pain in unspecified shoulder: Secondary | ICD-10-CM | POA: Diagnosis not present

## 2012-02-26 DIAGNOSIS — M25519 Pain in unspecified shoulder: Secondary | ICD-10-CM | POA: Diagnosis not present

## 2012-02-26 DIAGNOSIS — M75120 Complete rotator cuff tear or rupture of unspecified shoulder, not specified as traumatic: Secondary | ICD-10-CM | POA: Diagnosis not present

## 2012-02-26 DIAGNOSIS — M6281 Muscle weakness (generalized): Secondary | ICD-10-CM | POA: Diagnosis not present

## 2012-02-28 DIAGNOSIS — M25519 Pain in unspecified shoulder: Secondary | ICD-10-CM | POA: Diagnosis not present

## 2012-02-28 DIAGNOSIS — M75120 Complete rotator cuff tear or rupture of unspecified shoulder, not specified as traumatic: Secondary | ICD-10-CM | POA: Diagnosis not present

## 2012-02-28 DIAGNOSIS — M6281 Muscle weakness (generalized): Secondary | ICD-10-CM | POA: Diagnosis not present

## 2012-03-02 DIAGNOSIS — M75120 Complete rotator cuff tear or rupture of unspecified shoulder, not specified as traumatic: Secondary | ICD-10-CM | POA: Diagnosis not present

## 2012-03-02 DIAGNOSIS — M6281 Muscle weakness (generalized): Secondary | ICD-10-CM | POA: Diagnosis not present

## 2012-03-02 DIAGNOSIS — M25519 Pain in unspecified shoulder: Secondary | ICD-10-CM | POA: Diagnosis not present

## 2012-03-04 DIAGNOSIS — M25519 Pain in unspecified shoulder: Secondary | ICD-10-CM | POA: Diagnosis not present

## 2012-03-04 DIAGNOSIS — M75120 Complete rotator cuff tear or rupture of unspecified shoulder, not specified as traumatic: Secondary | ICD-10-CM | POA: Diagnosis not present

## 2012-03-04 DIAGNOSIS — M6281 Muscle weakness (generalized): Secondary | ICD-10-CM | POA: Diagnosis not present

## 2012-03-09 DIAGNOSIS — M25519 Pain in unspecified shoulder: Secondary | ICD-10-CM | POA: Diagnosis not present

## 2012-03-09 DIAGNOSIS — M75120 Complete rotator cuff tear or rupture of unspecified shoulder, not specified as traumatic: Secondary | ICD-10-CM | POA: Diagnosis not present

## 2012-03-09 DIAGNOSIS — M6281 Muscle weakness (generalized): Secondary | ICD-10-CM | POA: Diagnosis not present

## 2012-03-11 DIAGNOSIS — M75120 Complete rotator cuff tear or rupture of unspecified shoulder, not specified as traumatic: Secondary | ICD-10-CM | POA: Diagnosis not present

## 2012-03-11 DIAGNOSIS — M25519 Pain in unspecified shoulder: Secondary | ICD-10-CM | POA: Diagnosis not present

## 2012-03-11 DIAGNOSIS — M6281 Muscle weakness (generalized): Secondary | ICD-10-CM | POA: Diagnosis not present

## 2012-03-16 DIAGNOSIS — M75120 Complete rotator cuff tear or rupture of unspecified shoulder, not specified as traumatic: Secondary | ICD-10-CM | POA: Diagnosis not present

## 2012-03-16 DIAGNOSIS — M25519 Pain in unspecified shoulder: Secondary | ICD-10-CM | POA: Diagnosis not present

## 2012-03-16 DIAGNOSIS — M6281 Muscle weakness (generalized): Secondary | ICD-10-CM | POA: Diagnosis not present

## 2012-03-18 DIAGNOSIS — M25519 Pain in unspecified shoulder: Secondary | ICD-10-CM | POA: Diagnosis not present

## 2012-03-18 DIAGNOSIS — M75120 Complete rotator cuff tear or rupture of unspecified shoulder, not specified as traumatic: Secondary | ICD-10-CM | POA: Diagnosis not present

## 2012-03-18 DIAGNOSIS — M6281 Muscle weakness (generalized): Secondary | ICD-10-CM | POA: Diagnosis not present

## 2012-03-19 ENCOUNTER — Other Ambulatory Visit: Payer: Self-pay | Admitting: *Deleted

## 2012-03-19 MED ORDER — FINASTERIDE 5 MG PO TABS: 5.0000 mg | ORAL_TABLET | Freq: Every day | ORAL | Status: DC

## 2012-03-20 DIAGNOSIS — M75120 Complete rotator cuff tear or rupture of unspecified shoulder, not specified as traumatic: Secondary | ICD-10-CM | POA: Diagnosis not present

## 2012-03-23 DIAGNOSIS — M6281 Muscle weakness (generalized): Secondary | ICD-10-CM | POA: Diagnosis not present

## 2012-03-23 DIAGNOSIS — M75120 Complete rotator cuff tear or rupture of unspecified shoulder, not specified as traumatic: Secondary | ICD-10-CM | POA: Diagnosis not present

## 2012-03-23 DIAGNOSIS — M25519 Pain in unspecified shoulder: Secondary | ICD-10-CM | POA: Diagnosis not present

## 2012-03-24 ENCOUNTER — Other Ambulatory Visit: Payer: Self-pay

## 2012-03-24 NOTE — Telephone Encounter (Signed)
Pt's wife called re: refill on finasteride. Filled on 03/19/12 to [facility]; she will ck with [facility].

## 2012-03-25 DIAGNOSIS — M25519 Pain in unspecified shoulder: Secondary | ICD-10-CM | POA: Diagnosis not present

## 2012-03-25 DIAGNOSIS — M75120 Complete rotator cuff tear or rupture of unspecified shoulder, not specified as traumatic: Secondary | ICD-10-CM | POA: Diagnosis not present

## 2012-03-25 DIAGNOSIS — M6281 Muscle weakness (generalized): Secondary | ICD-10-CM | POA: Diagnosis not present

## 2012-05-29 DIAGNOSIS — Z85828 Personal history of other malignant neoplasm of skin: Secondary | ICD-10-CM | POA: Diagnosis not present

## 2012-05-29 DIAGNOSIS — L570 Actinic keratosis: Secondary | ICD-10-CM | POA: Diagnosis not present

## 2012-05-29 DIAGNOSIS — D235 Other benign neoplasm of skin of trunk: Secondary | ICD-10-CM | POA: Diagnosis not present

## 2012-07-14 DIAGNOSIS — Z23 Encounter for immunization: Secondary | ICD-10-CM | POA: Diagnosis not present

## 2012-08-05 DIAGNOSIS — J029 Acute pharyngitis, unspecified: Secondary | ICD-10-CM | POA: Diagnosis not present

## 2012-08-05 DIAGNOSIS — J019 Acute sinusitis, unspecified: Secondary | ICD-10-CM | POA: Diagnosis not present

## 2012-09-01 DIAGNOSIS — Z961 Presence of intraocular lens: Secondary | ICD-10-CM | POA: Diagnosis not present

## 2012-09-01 DIAGNOSIS — H52209 Unspecified astigmatism, unspecified eye: Secondary | ICD-10-CM | POA: Diagnosis not present

## 2012-09-01 DIAGNOSIS — E1139 Type 2 diabetes mellitus with other diabetic ophthalmic complication: Secondary | ICD-10-CM | POA: Diagnosis not present

## 2012-10-06 ENCOUNTER — Encounter: Payer: Self-pay | Admitting: Cardiology

## 2012-10-06 DIAGNOSIS — I10 Essential (primary) hypertension: Secondary | ICD-10-CM | POA: Diagnosis not present

## 2012-10-06 DIAGNOSIS — I2510 Atherosclerotic heart disease of native coronary artery without angina pectoris: Secondary | ICD-10-CM

## 2012-10-06 DIAGNOSIS — E785 Hyperlipidemia, unspecified: Secondary | ICD-10-CM | POA: Diagnosis not present

## 2012-10-06 NOTE — Progress Notes (Signed)
Patient ID: Paul Terrell, male   DOB: [birth year], 79 y.o.   MRN: [id number]   Paul Terrell    Date of visit:  10/06/2012 DOB:  26-Jan-1931    Age:  79 yrs. Medical record number:  [id number]     Account number:  [id number] Primary Care Provider: Binnie Rail ____________________________ CURRENT DIAGNOSES  1. CAD,Native  2. Hyperlipidemia  3. Hypertension-Essential (Benign)  4. Stent Placement  5. Diabetes Mellitus-NIDD/circ dis/controlled ____________________________ ALLERGIES  NKDA ____________________________ MEDICATIONS  1. aspirin 81 mg tablet, effervescent, 1 p.o. daily  2. Vitamin B-12 500 mcg tablet, 1 p.o. daily  3. Flomax 0.4 mg capsule,extended release 24hr, 1 p.o. daily  4. glyburide 5 mg tablet, 1 p.o. daily  5. metformin 1,000 mg tablet, B.I.D.  6. multivitamin tablet, 1 p.o. q.d.  7. nitroglycerin 0.4 mg tablet, sublingual, PRN  8. simvastatin 40 mg Tablet, 1 p.o. daily  9. Fish Oil 1,000 mg Capsule, 1 p.o. daily  10. finasteride 5 mg tablet, 1 p.o. daily  11. metoprolol succinate 25 mg tablet extended release 24 hr, 1 p.o. daily  12. lisinopril-hydrochlorothiazide 20-25 mg tablet, 1 p.o. daily  13. Vitamin D3 5,000 unit tablet, 1 p.o. daily  14. vitamin E 400 unit capsule, 1 p.o. daily  15. CoQ-10 100 mg capsule, 200mg  qd ____________________________ CHIEF COMPLAINTS  Followup of CAD,Native  Followup of Hyperlipidemia ____________________________ HISTORY OF PRESENT ILLNESS   Patient seen for cardiac followup. He has had a good year since he was previously here. He denies angina and has no PND, orthopnea, syncope, palpitations, or claudication. His lipids were reviewed today and are under excellent control. He is exercising on a regular basis. His blood pressure is been much better controlled since last year. He is no longer having significant dizziness. ____________________________ PAST HISTORY  Past Medical Illnesses:  hypertension, DM-non-insulin dependent,  hyperlipidemia, hiatal hernia, kidney stones, BPH;  Cardiovascular Illnesses:  CAD;  Surgical Procedures:  arthroscopic knee surgery, laminectomy cervical, bunion surgery on foot, kidney stone, left rotator cuff surgery, cataract extraction OU;  Cardiology Procedures-Invasive:  cardiac cath (left), stent RCA October 2001;  Cardiology Procedures-Noninvasive:  lexiscan cardiolite March 2012;  Cardiac Cath Results:  normal Left main, 30% stenosis distal LAD, 40% stenosis mid LAD, 90% stenosis proximal Diag 1, 90% stenosis mid RCA, no significant disease CFX;  LVEF of 73% documented via nuclear study on 12/13/2010 ____________________________ CARDIO-PULMONARY TEST DATES EKG Date:  10/09/2010;   Cardiac Cath Date:  07/17/2000;  Stent Placement Date: 07/17/2000;  Nuclear Study Date:  12/13/2010;  Chest Xray Date: 05/29/2000;   ____________________________ SOCIAL HISTORY Alcohol Use:  no alcohol use;  Smoking:  used to smoke but quit Prior to 1980;  Diet:  diabetic diet;  Lifestyle:  married, 1 son and 1 daughter;  Exercise:  some exercise;  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  Cardiovascular:  please review HPI  Abdominal:  denies dyspepsia, GI bleeding, constipation, or diarrhea  Genitourinary-Male:  hesitancy  Musculoskeletal:  arthritis of the right hand, torn rotator cuff right ____________________________ PHYSICAL EXAMINATION VITAL SIGNS  Blood Pressure:  138/78 Sitting, Left arm, regular cuff  , 130/70 Standing, Left arm and regular cuff   Pulse:  78/min. Weight:  180.00 lbs. Height:  68"BMI: 27  Constitutional:  pleasant white male in no acute distress, mildly obese Skin:  occasional sebaceous cysts Head:  normocephalic, normal hair  pattern, no masses or tenderness ENT:  bilateral hearing aides present Neck:  supple, no masses, thyromegaly, JVD. Carotid pulses are full and equal bilaterally without bruits. Chest:  clear to auscultation and percussion Cardiac:  regular rhythm, normal S1 and S2, No S3 or S4, no murmurs, gallops or rubs detected. Peripheral Pulses:  the femoral,dorsalis pedis, and posterior tibial pulses are full and equal bilaterally with no bruits auscultated. Extremities & Back:  no deformities, clubbing, cyanosis, erythema or edema observed. Normal muscle strength and tone. Neurological:  no gross motor or sensory deficits noted, affect appropriate, oriented x3. ____________________________ MOST RECENT LIPID PANEL 10/06/12  CHOL TOTL 142 mg/dl, LDL 71 calc, HDL 44 mg/dl, TRIGLYCER 161 mg/dl and CHOL/HDL 3.2 (Calc) ____________________________ IMPRESSIONS/PLAN  1. Coronary artery disease with previous stent 2. Hypertension currently controlled 3. Hyperlipidemia controlled  Recommendations:  Clinically getting along reasonably well at this time. Lipids are excellent. ____________________________ TODAYS ORDERS  1. Return Visit: 1 year  2. 12 Lead EKG: 1 year                       ____________________________ Cardiology Physician:  Darden Palmer MD [facility]

## 2012-10-20 ENCOUNTER — Other Ambulatory Visit: Payer: Self-pay | Admitting: Family Medicine

## 2012-10-27 ENCOUNTER — Telehealth: Payer: Self-pay

## 2012-10-27 NOTE — Telephone Encounter (Signed)
Will address the hard copy.  

## 2012-10-27 NOTE — Telephone Encounter (Signed)
[employer] faxed prior auth form for glyburide; form on Dr Lianne Bushy desk in the in box.

## 2012-11-03 ENCOUNTER — Encounter: Payer: Self-pay | Admitting: Family Medicine

## 2012-11-03 ENCOUNTER — Ambulatory Visit (INDEPENDENT_AMBULATORY_CARE_PROVIDER_SITE_OTHER): Payer: Medicare Other | Admitting: Family Medicine

## 2012-11-03 VITALS — BP 142/62 | HR 64 | Temp 97.8°F | Wt 181.8 lb

## 2012-11-03 DIAGNOSIS — R5381 Other malaise: Secondary | ICD-10-CM | POA: Diagnosis not present

## 2012-11-03 DIAGNOSIS — E1159 Type 2 diabetes mellitus with other circulatory complications: Secondary | ICD-10-CM

## 2012-11-03 DIAGNOSIS — E119 Type 2 diabetes mellitus without complications: Secondary | ICD-10-CM | POA: Diagnosis not present

## 2012-11-03 DIAGNOSIS — R5383 Other fatigue: Secondary | ICD-10-CM | POA: Diagnosis not present

## 2012-11-03 DIAGNOSIS — M25519 Pain in unspecified shoulder: Secondary | ICD-10-CM

## 2012-11-03 DIAGNOSIS — I798 Other disorders of arteries, arterioles and capillaries in diseases classified elsewhere: Secondary | ICD-10-CM

## 2012-11-03 NOTE — Patient Instructions (Addendum)
I would try cutting out the sleeping aid and see if that helps the fatigue in the morning.  Go to the lab on the way out.  We'll contact you with your lab report. Take care.  Keep working on your diet in the meantime.  Schedule a physical for this summer.

## 2012-11-03 NOTE — Progress Notes (Signed)
Sugar has been 140-200 in AM now, this is elevated compared to the past.  His diet was altered during the holidays, but his diet is back to normal now.  He doesn't feel well in the AM, more fatigue than anything.  It gets better later in the day, at least after he has his coffee.  No CP, no SOB, trace occ BLE edema.  Metformin is BID, glyburide is in AM.  He is sleeping well if he uses OTC sleep aids.  This isn't new. He hasn't tried to cut this out yet.  He doesn't nap during the day.  He does occ snore per his wife.    His R shoulder is improved after injection and PT per ortho.    Meds, vitals, and allergies reviewed.   ROS: See HPI.  Otherwise, noncontributory.  GEN: nad, alert and oriented HEENT: mucous membranes moist NECK: supple w/o LA CV: rrr.  no murmur PULM: ctab, no inc wob ABD: soft, +bs EXT: no edema SKIN: no acute rash

## 2012-11-03 NOTE — Telephone Encounter (Signed)
Glyburide was approved; letter on Dr Lianne Bushy desk for signature and scanning.

## 2012-11-04 DIAGNOSIS — R5383 Other fatigue: Secondary | ICD-10-CM | POA: Insufficient documentation

## 2012-11-04 DIAGNOSIS — R5381 Other malaise: Secondary | ICD-10-CM | POA: Insufficient documentation

## 2012-11-04 LAB — HEMOGLOBIN A1C: Hgb A1c MFr Bld: 6.5 % (ref 4.6–6.5)

## 2012-11-04 NOTE — Assessment & Plan Note (Signed)
a1c pending today.  We'll address based on that.  He agrees.  He's back working on his diet.  Unclear if this is related to the fatigue.

## 2012-11-04 NOTE — Assessment & Plan Note (Signed)
Improved with less pain and inc rom after injection/exercise per ortho.

## 2012-11-04 NOTE — Assessment & Plan Note (Signed)
This is mainly in AM, no trouble later in the day.  Possible relation to sleep meds. He'll cut back and see if he improves.  If not, he'll notify me.  No other obvious cause.  No CP, not SOB and no BLE edema.

## 2012-12-21 ENCOUNTER — Other Ambulatory Visit: Payer: Self-pay | Admitting: Family Medicine

## 2013-01-21 ENCOUNTER — Other Ambulatory Visit: Payer: Self-pay | Admitting: *Deleted

## 2013-01-21 MED ORDER — TAMSULOSIN HCL 0.4 MG PO CAPS: 0.4000 mg | ORAL_CAPSULE | Freq: Every day | ORAL | Status: DC

## 2013-02-01 ENCOUNTER — Ambulatory Visit (INDEPENDENT_AMBULATORY_CARE_PROVIDER_SITE_OTHER): Payer: Medicare Other | Admitting: Family Medicine

## 2013-02-01 ENCOUNTER — Encounter: Payer: Self-pay | Admitting: Family Medicine

## 2013-02-01 VITALS — BP 126/66 | HR 77 | Temp 98.3°F | Wt 172.8 lb

## 2013-02-01 DIAGNOSIS — R05 Cough: Secondary | ICD-10-CM | POA: Diagnosis not present

## 2013-02-01 DIAGNOSIS — R059 Cough, unspecified: Secondary | ICD-10-CM | POA: Insufficient documentation

## 2013-02-01 NOTE — Assessment & Plan Note (Signed)
Improved some today, would use mucinex and nyquil and f/u prn.  Should resolve gradually.  Likely viral. Nontoxic.  D/w pt about ddx.  He agrees.

## 2013-02-01 NOTE — Progress Notes (Signed)
Cough noted for about 1 week.  Has been working outside in the pollen; weather changes noted.  Sneezing, rhinorrhea.  Minimal diarrhea, 1 episode of nausea. The cough is most bothersome for the patient.  Nyquil helps some.  No fevers.  Scant sputum, clear.  Not SOB.  Minimal wheezing, not more than typical.  Overall, he feels a little better today.  He hasn't slept well.  He is fatigued.  Mucinex has helped some.    Meds, vitals, and allergies reviewed.   ROS: See HPI.  Otherwise, noncontributory.  GEN: nad, alert and oriented HEENT: mucous membranes moist, tm w/o erythema, nasal exam w/o erythema, clear discharge noted,  OP with cobblestoning NECK: supple w/o LA CV: rrr.   PULM: ctab, no inc wob EXT: no edema SKIN: no acute rash

## 2013-02-01 NOTE — Patient Instructions (Addendum)
Keep taking the mucinex and the nyquil.  This should gradually get better.  If not, then let me know.

## 2013-03-27 ENCOUNTER — Other Ambulatory Visit: Payer: Self-pay | Admitting: Family Medicine

## 2013-04-01 ENCOUNTER — Other Ambulatory Visit: Payer: Self-pay

## 2013-04-12 DIAGNOSIS — H6120 Impacted cerumen, unspecified ear: Secondary | ICD-10-CM | POA: Diagnosis not present

## 2013-06-04 DIAGNOSIS — L570 Actinic keratosis: Secondary | ICD-10-CM | POA: Diagnosis not present

## 2013-06-04 DIAGNOSIS — D235 Other benign neoplasm of skin of trunk: Secondary | ICD-10-CM | POA: Diagnosis not present

## 2013-06-04 DIAGNOSIS — B079 Viral wart, unspecified: Secondary | ICD-10-CM | POA: Diagnosis not present

## 2013-06-04 DIAGNOSIS — Z85828 Personal history of other malignant neoplasm of skin: Secondary | ICD-10-CM | POA: Diagnosis not present

## 2013-07-16 DIAGNOSIS — Z23 Encounter for immunization: Secondary | ICD-10-CM | POA: Diagnosis not present

## 2013-09-17 DIAGNOSIS — Z961 Presence of intraocular lens: Secondary | ICD-10-CM | POA: Diagnosis not present

## 2013-09-17 DIAGNOSIS — E119 Type 2 diabetes mellitus without complications: Secondary | ICD-10-CM | POA: Diagnosis not present

## 2013-09-17 DIAGNOSIS — H52209 Unspecified astigmatism, unspecified eye: Secondary | ICD-10-CM | POA: Diagnosis not present

## 2013-09-17 LAB — HM DIABETES EYE EXAM: HM Diabetic Eye Exam: NOT DETECTED

## 2013-09-21 ENCOUNTER — Other Ambulatory Visit: Payer: Self-pay | Admitting: *Deleted

## 2013-09-21 ENCOUNTER — Telehealth: Payer: Self-pay | Admitting: Family Medicine

## 2013-09-21 ENCOUNTER — Encounter: Payer: Self-pay | Admitting: Family Medicine

## 2013-09-21 MED ORDER — GLYBURIDE 5 MG PO TABS: ORAL_TABLET | ORAL | Status: DC

## 2013-09-21 NOTE — Telephone Encounter (Signed)
Pt wife made [facility] appt for 11/01/13. She stated pt will be running out of Glyburide middle to end of January and they use mail order. If they are unable to get medicine refilled through mail order their local pharmacy is [facility] to get the pt by on his medicine until his cpe appt.

## 2013-09-21 NOTE — Telephone Encounter (Signed)
Refill sent to mail order pharmacy.

## 2013-09-24 ENCOUNTER — Other Ambulatory Visit: Payer: Self-pay | Admitting: *Deleted

## 2013-09-24 MED ORDER — FINASTERIDE 5 MG PO TABS: ORAL_TABLET | ORAL | Status: DC

## 2013-10-05 ENCOUNTER — Encounter: Payer: Self-pay | Admitting: Cardiology

## 2013-10-05 DIAGNOSIS — I2510 Atherosclerotic heart disease of native coronary artery without angina pectoris: Secondary | ICD-10-CM | POA: Diagnosis not present

## 2013-10-05 DIAGNOSIS — I10 Essential (primary) hypertension: Secondary | ICD-10-CM | POA: Diagnosis not present

## 2013-10-05 DIAGNOSIS — E785 Hyperlipidemia, unspecified: Secondary | ICD-10-CM | POA: Diagnosis not present

## 2013-10-05 NOTE — Progress Notes (Signed)
Patient ID: Paul Terrell, male   DOB: 1930-09-28, 80 y.o.   MRN: [id number]   Chick, Cousins    Date of visit:  10/05/2013 DOB:  Mar 31, 1931    Age:  80 yrs. Medical record number:  [id number]     Account number:  [id number] Primary Care Provider: Chrissie Noa ____________________________ CURRENT DIAGNOSES  1. CAD,Native  2. Hyperlipidemia  3. Hypertension-Essential (Benign)  4. Stent Placement  5. Diabetes Mellitus-NIDD/circ dis/controlled ____________________________ ALLERGIES  No Known Allergies ____________________________ MEDICATIONS  1. Vitamin B-12 500 mcg tablet, 1 p.o. daily  2. Flomax 0.4 mg capsule,extended release 24hr, 1 p.o. daily  3. glyburide 5 mg tablet, 1 p.o. daily  4. metformin 1,000 mg tablet, B.I.D.  5. multivitamin tablet, 1 p.o. q.d.  6. Fish Oil 1,000 mg Capsule, 1 p.o. daily  7. finasteride 5 mg tablet, 1 p.o. daily  8. Vitamin D3 5,000 unit tablet, 1 p.o. daily  9. vitamin E 400 unit capsule, 1 p.o. daily  10. CoQ-10 100 mg capsule, 200mg  qd  11. aspirin 81 mg chewable tablet, 1 p.o. daily  12. nitroglycerin 0.4 mg sublingual tablet, PRN  13. lisinopril 20 mg-hydrochlorothiazide 25 mg tablet, 1 p.o. daily  14. metoprolol succinate ER 25 mg tablet,extended release 24 hr, 1 p.o. daily  15. simvastatin 40 mg tablet, 1 p.o. daily ____________________________ CHIEF COMPLAINTS  Followup of CAD,Native  Followup of Hypertension-Essential (Benign)  Followup of Stent Placement ____________________________ HISTORY OF PRESENT ILLNESS  Patient seen for cardiac followup. He has been doing well since he was previously here. He did have an episode where he took a long walk to the [place] and then walked up a ramp a long distance and had some mild tightness in his chest that resolved when he lasted. On the long walk back to the car he had a similar episode but has only had these 2 isolated episodes that sound like angina with no recurrence since then. He is exercising  on a regular basis and his lipids were reviewed today and show excellent control. He denies PND, orthopnea, syncope, palpitations, or claudication. ____________________________ PAST HISTORY  Past Medical Illnesses:  hypertension, DM-non-insulin dependent, hyperlipidemia, hiatal hernia, kidney stones, BPH;  Cardiovascular Illnesses:  CAD;  Surgical Procedures:  arthroscopic knee surgery, laminectomy cervical, bunion surgery on foot, kidney stone, left rotator cuff surgery, cataract extraction OU;  Cardiology Procedures-Invasive:  cardiac cath (left), stent RCA October 2001;  Cardiology Procedures-Noninvasive:  lexiscan cardiolite March 2012;  Cardiac Cath Results:  normal Left main, 30% stenosis distal LAD, 40% stenosis mid LAD, 90% stenosis proximal Diag 1, 90% stenosis mid RCA, no significant disease CFX;  LVEF of 73% documented via nuclear study on 12/13/2010,   ____________________________ CARDIO-PULMONARY TEST DATES EKG Date:  10/09/2010;   Cardiac Cath Date:  07/17/2000;  Stent Placement Date: 07/17/2000;  Nuclear Study Date:  12/13/2010;  Chest Xray Date: 05/29/2000;   ____________________________ FAMILY HISTORY Brother -- Brother dead, Myocardial infarction at less than 86 Brother -- Brother alive and well Brother -- Brother dead, Coronary Artery Disease, Liver disease Father -- Father dead, Myocardial infarction Mother -- Mother dead, CVA, Coronary Artery Disease Sister -- Sister alive with problem, Diabetes mellitus Sister -- Sister alive with problem ____________________________ SOCIAL HISTORY Alcohol Use:  no alcohol use;  Smoking:  used to smoke but quit Prior to 1980;  Diet:  diabetic diet;  Lifestyle:  married, 1 son and 1 daughter;  Exercise:  some exercise;  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 Cardiovascular:  please review HPI Abdominal: denies dyspepsia, GI bleeding, constipation, or diarrhea Genitourinary-Male: hesitancy  Musculoskeletal:  arthritis of the right hand, torn rotator cuff right  ____________________________ PHYSICAL EXAMINATION VITAL SIGNS  Blood Pressure:  138/70 Sitting, Right arm, regular cuff  , 134/70 Standing, Right arm and regular cuff   Pulse:  60/min. Weight:  177.00 lbs. Height:  68"BMI: 27  Constitutional:  pleasant white male in no acute distress, mildly obese Skin:  occasional sebaceous cysts Head:  normocephalic, normal hair pattern, no masses or tenderness ENT:  bilateral hearing aides present Neck:  supple, no masses, thyromegaly, JVD. Carotid pulses are full and equal bilaterally without bruits. Chest:  clear to auscultation and percussion Cardiac:  regular rhythm, normal S1 and S2, No S3 or S4, no murmurs, gallops or rubs detected. Peripheral Pulses:  the femoral,dorsalis pedis, and posterior tibial pulses are full and equal bilaterally with no bruits auscultated. Extremities & Back:  no deformities, clubbing, cyanosis, erythema or edema observed. Normal muscle strength and tone. Neurological:  no gross motor or sensory deficits noted, affect appropriate, oriented x3. ____________________________ MOST RECENT LIPID PANEL 10/06/12  CHOL TOTL 142 mg/dl, LDL 71 calc, HDL 44 mg/dl, TRIGLYCER 131 mg/dl and CHOL/HDL 3.2 (Calc) ____________________________ IMPRESSIONS/PLAN  IMPRESSIONS:  1. 2 episodes of what sound like angina recently exertion 2. Coronary artery disease with previous stent 3. Hyperlipidemia under treatment 4. Hypertension controlled  Recommendations:  Lipid values were reviewed. It sounds as if he may have had 2 episodes of angina that were isolated with a significant amount of exertion that he normally does not do. I asked him to keep an eye on these  things and to call if he has recurrence of symptoms. Otherwise followup in one year. ____________________________ TODAYS ORDERS  1. Return Visit: 1 year  2. 12 Lead EKG: 1 year  3. Lipid Panel: Today                       ____________________________ Cardiology Physician:  Kerry Hough MD [facility]

## 2013-10-21 ENCOUNTER — Other Ambulatory Visit: Payer: Self-pay | Admitting: Family Medicine

## 2013-10-21 DIAGNOSIS — E1159 Type 2 diabetes mellitus with other circulatory complications: Secondary | ICD-10-CM

## 2013-10-25 ENCOUNTER — Other Ambulatory Visit (INDEPENDENT_AMBULATORY_CARE_PROVIDER_SITE_OTHER): Payer: Medicare Other

## 2013-10-25 DIAGNOSIS — E1159 Type 2 diabetes mellitus with other circulatory complications: Secondary | ICD-10-CM | POA: Diagnosis not present

## 2013-10-25 DIAGNOSIS — I798 Other disorders of arteries, arterioles and capillaries in diseases classified elsewhere: Secondary | ICD-10-CM | POA: Diagnosis not present

## 2013-10-25 LAB — COMPREHENSIVE METABOLIC PANEL
ALT: 29 U/L (ref 0–53)
AST: 27 U/L (ref 0–37)
Albumin: 4 g/dL (ref 3.5–5.2)
Alkaline Phosphatase: 51 U/L (ref 39–117)
BUN: 18 mg/dL (ref 6–23)
CO2: 27 mEq/L (ref 19–32)
Calcium: 9.5 mg/dL (ref 8.4–10.5)
Chloride: 103 mEq/L (ref 96–112)
Creatinine, Ser: 0.9 mg/dL (ref 0.4–1.5)
GFR: 89.61 mL/min (ref >60.00)
Glucose, Bld: 151 mg/dL — ABNORMAL HIGH (ref 70–99)
Potassium: 4.4 mEq/L (ref 3.5–5.1)
Sodium: 138 mEq/L (ref 135–145)
Total Bilirubin: 0.8 mg/dL (ref 0.3–1.2)
Total Protein: 6.8 g/dL (ref 6.0–8.3)

## 2013-10-25 LAB — LIPID PANEL
Cholesterol: 133 mg/dL (ref 0–200)
HDL: 42.5 mg/dL (ref >39.00)
LDL Cholesterol: 59 mg/dL (ref 0–99)
Total CHOL/HDL Ratio: 3
Triglycerides: 157 mg/dL — ABNORMAL HIGH (ref 0.0–149.0)
VLDL: 31.4 mg/dL (ref 0.0–40.0)

## 2013-10-25 LAB — HEMOGLOBIN A1C: Hgb A1c MFr Bld: 6.9 % — ABNORMAL HIGH (ref 4.6–6.5)

## 2013-11-01 ENCOUNTER — Ambulatory Visit (INDEPENDENT_AMBULATORY_CARE_PROVIDER_SITE_OTHER): Payer: Medicare Other | Admitting: Family Medicine

## 2013-11-01 ENCOUNTER — Encounter: Payer: Self-pay | Admitting: Family Medicine

## 2013-11-01 VITALS — BP 144/70 | HR 72 | Temp 97.8°F | Ht 66.5 in | Wt 179.8 lb

## 2013-11-01 DIAGNOSIS — I119 Hypertensive heart disease without heart failure: Secondary | ICD-10-CM

## 2013-11-01 DIAGNOSIS — E785 Hyperlipidemia, unspecified: Secondary | ICD-10-CM

## 2013-11-01 DIAGNOSIS — E1159 Type 2 diabetes mellitus with other circulatory complications: Secondary | ICD-10-CM | POA: Diagnosis not present

## 2013-11-01 DIAGNOSIS — Z0000 Encounter for general adult medical examination without abnormal findings: Secondary | ICD-10-CM | POA: Diagnosis not present

## 2013-11-01 DIAGNOSIS — N400 Enlarged prostate without lower urinary tract symptoms: Secondary | ICD-10-CM | POA: Diagnosis not present

## 2013-11-01 MED ORDER — METFORMIN HCL 1000 MG PO TABS: ORAL_TABLET | ORAL | Status: DC

## 2013-11-01 MED ORDER — TAMSULOSIN HCL 0.4 MG PO CAPS: 0.4000 mg | ORAL_CAPSULE | Freq: Every day | ORAL | Status: DC

## 2013-11-01 MED ORDER — LISINOPRIL-HYDROCHLOROTHIAZIDE 20-25 MG PO TABS: 1.0000 | ORAL_TABLET | Freq: Every day | ORAL | Status: DC

## 2013-11-01 NOTE — Assessment & Plan Note (Signed)
Controlled, continue current meds. Labs d/w pt.  

## 2013-11-01 NOTE — Assessment & Plan Note (Signed)
Reasonable control, continue current meds.  Labs d/w pt.  

## 2013-11-01 NOTE — Patient Instructions (Signed)
Don't change your meds.  Take care.  Glad to see you.  You labs look good.

## 2013-11-01 NOTE — Assessment & Plan Note (Signed)
See scanned forms.  Routine anticipatory guidance given to patient.  See health maintenance. Flu 2014 Shingles 2014 PNA 2013 per patient report Tetanus 2005 Colon cancer screening not indicated.  Prostate cancer screening d/w pt.  Declined.  PSA not indicated.  Advance directive d/w pt. Wife designated if incapacitated.  Cognitive function addressed- see scanned forms- and if abnormal then additional documentation follows.

## 2013-11-01 NOTE — Assessment & Plan Note (Signed)
Controlled, continue current meds. Labs d/w pt.  A1c at goal.

## 2013-11-01 NOTE — Assessment & Plan Note (Signed)
Stream is good, no ADE on meds, continue as is.

## 2013-11-01 NOTE — Progress Notes (Signed)
Pre-visit discussion using our clinic review tool. No additional management support is needed unless otherwise documented below in the visit note.  I have personally reviewed the Medicare Annual Wellness questionnaire and have noted 1. The patient's medical and social history 2. Their use of alcohol, tobacco or illicit drugs 3. Their current medications and supplements 4. The patient's functional ability including ADL's, fall risks, home safety risks and hearing or visual             impairment. 5. Diet and physical activities 6. Evidence for depression or mood disorders  The patients weight, height, BMI have been recorded in the chart and visual acuity is per eye clinic.  I have made referrals, counseling and provided education to the patient based review of the above and I have provided the pt with a written personalized care plan for preventive services.  See scanned forms.  Routine anticipatory guidance given to patient.  See health maintenance. Flu 2014 Shingles 2014 PNA 2013 per patient report Tetanus 2005 Colon cancer screening not indicated.  Prostate cancer screening d/w pt.  Declined.  PSA not indicated.  Advance directive d/w pt. Wife designated if incapacitated.  Cognitive function addressed- see scanned forms- and if abnormal then additional documentation follows.   Diabetes:  Using medications without difficulties: yes Hypoglycemic episodes:no Hyperglycemic episodes:no Feet problems:no Blood Sugars averaging: usually 100-150 eye exam within last year: yes  Hypertension:    Using medication without problems or lightheadedness: yes Chest pain with exertion:no Edema: minimal Short of breath:no  Elevated Cholesterol: Using medications without problems:yes Muscle aches: no Diet compliance:yes, encouraged.  Exercise: "I'm active."  BPH.  Stream is good, no ADE on meds.  Compliant.    PMH and SH reviewed.   Vital signs, Meds and allergies reviewed.  ROS: See  HPI.  Otherwise nontributory.   GEN: nad, alert and oriented, hard of hearing HEENT: mucous membranes moist NECK: supple w/o LA CV: rrr PULM: ctab, no inc wob ABD: soft, +bs EXT: no edema SKIN: no acute rash DRE deferred.   Diabetic foot exam: Normal inspection No skin breakdown No calluses  Normal DP pulses Normal sensation to light tough and monofilament Nails normal

## 2013-11-04 ENCOUNTER — Telehealth: Payer: Self-pay

## 2013-11-04 NOTE — Telephone Encounter (Signed)
Relevant patient education mailed to patient.  

## 2013-11-22 ENCOUNTER — Other Ambulatory Visit: Payer: Self-pay | Admitting: *Deleted

## 2013-11-22 MED ORDER — METOPROLOL SUCCINATE ER 50 MG PO TB24: ORAL_TABLET | ORAL | Status: DC

## 2013-11-22 NOTE — Telephone Encounter (Signed)
Received faxed refill request from pharmacy. Refill sent to pharmacy electronically. 

## 2013-11-24 ENCOUNTER — Telehealth: Payer: Self-pay | Admitting: Family Medicine

## 2013-11-24 ENCOUNTER — Other Ambulatory Visit: Payer: Self-pay | Admitting: Family Medicine

## 2013-11-24 NOTE — Telephone Encounter (Signed)
Received a form from mail order pharmacy/ clarification needed on Metoprolol. Form is in your in box.

## 2013-11-25 NOTE — Telephone Encounter (Signed)
Form done. Thanks.  Should be 50mg  dose per records here.

## 2013-11-25 NOTE — Telephone Encounter (Signed)
Faxed

## 2013-12-16 ENCOUNTER — Telehealth: Payer: Self-pay

## 2013-12-16 NOTE — Telephone Encounter (Signed)
Pt brought letter from [facility] requesting prior auth Glyburide; letter given to Dr Josefine Class assistant.

## 2013-12-16 NOTE — Telephone Encounter (Signed)
Submitted to [facility] 12/16/13.

## 2013-12-23 NOTE — Telephone Encounter (Signed)
Approval letter received. Pharmacy notified.

## 2014-01-28 ENCOUNTER — Ambulatory Visit (INDEPENDENT_AMBULATORY_CARE_PROVIDER_SITE_OTHER): Payer: Medicare Other | Admitting: Family Medicine

## 2014-01-28 ENCOUNTER — Encounter: Payer: Self-pay | Admitting: Family Medicine

## 2014-01-28 VITALS — BP 122/56 | HR 75 | Temp 97.9°F | Wt 179.0 lb

## 2014-01-28 DIAGNOSIS — M79609 Pain in unspecified limb: Secondary | ICD-10-CM | POA: Diagnosis not present

## 2014-01-28 DIAGNOSIS — M79606 Pain in leg, unspecified: Secondary | ICD-10-CM

## 2014-01-28 MED ORDER — SIMVASTATIN 40 MG PO TABS: ORAL_TABLET | ORAL | Status: DC

## 2014-01-28 NOTE — Patient Instructions (Signed)
Stop the simvastatin for now and update me next week. Take care.  Glad to see you.

## 2014-01-28 NOTE — Progress Notes (Signed)
Pre visit review using our clinic review tool, if applicable. No additional management support is needed unless otherwise documented below in the visit note.  B foot pain and numbness.  From the knee down he has nocturnal pain, "cramping" pain but w/o knots in the leg.  No trauma.  No redness in the legs.  occ edema, mild, in the ankles.  Walking during the day- no pain.  Getting up at night helps the pain.  Going on for about 2 months. Happens most nights. On statin.  Pain is "down in" the legs ie not superficial.    Meds, vitals, and allergies reviewed.   ROS: See HPI.  Otherwise, noncontributory.  GEN: nad, alert and oriented HEENT: mucous membranes moist NECK: supple w/o LA CV: rrr. PULM: ctab, no inc wob ABD: soft, +bs EXT: no edema SKIN: no acute rash Normal DP pulses, calf not ttp x2

## 2014-01-30 DIAGNOSIS — M79606 Pain in leg, unspecified: Secondary | ICD-10-CM | POA: Insufficient documentation

## 2014-01-30 NOTE — Assessment & Plan Note (Signed)
No obvious cause other than the statin.  Not any typical claudication pain.  Stop the simvastatin for now and he'll update me next week.  D/w pt.  He agrees.

## 2014-02-10 ENCOUNTER — Telehealth: Payer: Self-pay | Admitting: Family Medicine

## 2014-02-10 MED ORDER — PRAVASTATIN SODIUM 20 MG PO TABS: 20.0000 mg | ORAL_TABLET | Freq: Every day | ORAL | Status: DC

## 2014-02-10 NOTE — Telephone Encounter (Signed)
Noted, thanks.  Change to pravastatin.  May be less likely to cause cramps. If he has the aches, then stop the medicine and notify us.  rx sent.  Med list updated.

## 2014-02-10 NOTE — Telephone Encounter (Signed)
Pt's wife called and pt's legs are doing better with discontinuing simvastatin (ZOCOR) 40 MG tablet [[id number] as of 01/28/14.   She would like to know if Dr. [name] would like to prescribe another medication for his cholesterol control.  Please advise.  [facility] [address] [facility].  Best number to call pt is [phone]

## 2014-04-29 ENCOUNTER — Other Ambulatory Visit: Payer: Self-pay

## 2014-04-29 MED ORDER — PRAVASTATIN SODIUM 20 MG PO TABS: 20.0000 mg | ORAL_TABLET | Freq: Every day | ORAL | Status: DC

## 2014-04-29 NOTE — Telephone Encounter (Signed)
Paul Terrell request refill pravastatin sent to [facility] mail order due to cost of me. Advised done. Cancelled [facility] at [facility] [address] spoke with melanie.

## 2014-05-10 ENCOUNTER — Other Ambulatory Visit: Payer: Self-pay | Admitting: Family Medicine

## 2014-05-19 DIAGNOSIS — M999 Biomechanical lesion, unspecified: Secondary | ICD-10-CM | POA: Diagnosis not present

## 2014-05-19 DIAGNOSIS — IMO0002 Reserved for concepts with insufficient information to code with codable children: Secondary | ICD-10-CM | POA: Diagnosis not present

## 2014-05-19 DIAGNOSIS — M955 Acquired deformity of pelvis: Secondary | ICD-10-CM | POA: Diagnosis not present

## 2014-05-20 ENCOUNTER — Other Ambulatory Visit: Payer: Self-pay | Admitting: Family Medicine

## 2014-05-20 DIAGNOSIS — M955 Acquired deformity of pelvis: Secondary | ICD-10-CM | POA: Diagnosis not present

## 2014-05-20 DIAGNOSIS — IMO0002 Reserved for concepts with insufficient information to code with codable children: Secondary | ICD-10-CM | POA: Diagnosis not present

## 2014-05-20 DIAGNOSIS — M999 Biomechanical lesion, unspecified: Secondary | ICD-10-CM | POA: Diagnosis not present

## 2014-05-21 DIAGNOSIS — M999 Biomechanical lesion, unspecified: Secondary | ICD-10-CM | POA: Diagnosis not present

## 2014-05-21 DIAGNOSIS — M955 Acquired deformity of pelvis: Secondary | ICD-10-CM | POA: Diagnosis not present

## 2014-05-21 DIAGNOSIS — IMO0002 Reserved for concepts with insufficient information to code with codable children: Secondary | ICD-10-CM | POA: Diagnosis not present

## 2014-05-23 DIAGNOSIS — M999 Biomechanical lesion, unspecified: Secondary | ICD-10-CM | POA: Diagnosis not present

## 2014-05-23 DIAGNOSIS — IMO0002 Reserved for concepts with insufficient information to code with codable children: Secondary | ICD-10-CM | POA: Diagnosis not present

## 2014-05-23 DIAGNOSIS — M955 Acquired deformity of pelvis: Secondary | ICD-10-CM | POA: Diagnosis not present

## 2014-05-24 NOTE — Telephone Encounter (Signed)
Note was left cking on status of flomax refill; advised pt already sent to [facility] mail order.

## 2014-05-25 DIAGNOSIS — IMO0002 Reserved for concepts with insufficient information to code with codable children: Secondary | ICD-10-CM | POA: Diagnosis not present

## 2014-05-25 DIAGNOSIS — M955 Acquired deformity of pelvis: Secondary | ICD-10-CM | POA: Diagnosis not present

## 2014-05-25 DIAGNOSIS — M999 Biomechanical lesion, unspecified: Secondary | ICD-10-CM | POA: Diagnosis not present

## 2014-05-26 DIAGNOSIS — M955 Acquired deformity of pelvis: Secondary | ICD-10-CM | POA: Diagnosis not present

## 2014-05-26 DIAGNOSIS — M999 Biomechanical lesion, unspecified: Secondary | ICD-10-CM | POA: Diagnosis not present

## 2014-05-26 DIAGNOSIS — IMO0002 Reserved for concepts with insufficient information to code with codable children: Secondary | ICD-10-CM | POA: Diagnosis not present

## 2014-05-31 DIAGNOSIS — IMO0002 Reserved for concepts with insufficient information to code with codable children: Secondary | ICD-10-CM | POA: Diagnosis not present

## 2014-05-31 DIAGNOSIS — M999 Biomechanical lesion, unspecified: Secondary | ICD-10-CM | POA: Diagnosis not present

## 2014-05-31 DIAGNOSIS — M955 Acquired deformity of pelvis: Secondary | ICD-10-CM | POA: Diagnosis not present

## 2014-06-01 DIAGNOSIS — M955 Acquired deformity of pelvis: Secondary | ICD-10-CM | POA: Diagnosis not present

## 2014-06-01 DIAGNOSIS — M999 Biomechanical lesion, unspecified: Secondary | ICD-10-CM | POA: Diagnosis not present

## 2014-06-01 DIAGNOSIS — IMO0002 Reserved for concepts with insufficient information to code with codable children: Secondary | ICD-10-CM | POA: Diagnosis not present

## 2014-06-02 DIAGNOSIS — M999 Biomechanical lesion, unspecified: Secondary | ICD-10-CM | POA: Diagnosis not present

## 2014-06-02 DIAGNOSIS — IMO0002 Reserved for concepts with insufficient information to code with codable children: Secondary | ICD-10-CM | POA: Diagnosis not present

## 2014-06-02 DIAGNOSIS — M955 Acquired deformity of pelvis: Secondary | ICD-10-CM | POA: Diagnosis not present

## 2014-06-07 DIAGNOSIS — M999 Biomechanical lesion, unspecified: Secondary | ICD-10-CM | POA: Diagnosis not present

## 2014-06-07 DIAGNOSIS — M955 Acquired deformity of pelvis: Secondary | ICD-10-CM | POA: Diagnosis not present

## 2014-06-07 DIAGNOSIS — IMO0002 Reserved for concepts with insufficient information to code with codable children: Secondary | ICD-10-CM | POA: Diagnosis not present

## 2014-06-09 DIAGNOSIS — IMO0002 Reserved for concepts with insufficient information to code with codable children: Secondary | ICD-10-CM | POA: Diagnosis not present

## 2014-06-09 DIAGNOSIS — M999 Biomechanical lesion, unspecified: Secondary | ICD-10-CM | POA: Diagnosis not present

## 2014-06-09 DIAGNOSIS — M955 Acquired deformity of pelvis: Secondary | ICD-10-CM | POA: Diagnosis not present

## 2014-06-10 DIAGNOSIS — D235 Other benign neoplasm of skin of trunk: Secondary | ICD-10-CM | POA: Diagnosis not present

## 2014-06-10 DIAGNOSIS — L570 Actinic keratosis: Secondary | ICD-10-CM | POA: Diagnosis not present

## 2014-06-10 DIAGNOSIS — D485 Neoplasm of uncertain behavior of skin: Secondary | ICD-10-CM | POA: Diagnosis not present

## 2014-06-10 DIAGNOSIS — L821 Other seborrheic keratosis: Secondary | ICD-10-CM | POA: Diagnosis not present

## 2014-06-13 DIAGNOSIS — M955 Acquired deformity of pelvis: Secondary | ICD-10-CM | POA: Diagnosis not present

## 2014-06-13 DIAGNOSIS — IMO0002 Reserved for concepts with insufficient information to code with codable children: Secondary | ICD-10-CM | POA: Diagnosis not present

## 2014-06-13 DIAGNOSIS — M999 Biomechanical lesion, unspecified: Secondary | ICD-10-CM | POA: Diagnosis not present

## 2014-06-14 ENCOUNTER — Other Ambulatory Visit: Payer: Self-pay | Admitting: Family Medicine

## 2014-06-16 DIAGNOSIS — M955 Acquired deformity of pelvis: Secondary | ICD-10-CM | POA: Diagnosis not present

## 2014-06-16 DIAGNOSIS — IMO0002 Reserved for concepts with insufficient information to code with codable children: Secondary | ICD-10-CM | POA: Diagnosis not present

## 2014-06-16 DIAGNOSIS — M999 Biomechanical lesion, unspecified: Secondary | ICD-10-CM | POA: Diagnosis not present

## 2014-06-21 DIAGNOSIS — IMO0002 Reserved for concepts with insufficient information to code with codable children: Secondary | ICD-10-CM | POA: Diagnosis not present

## 2014-06-21 DIAGNOSIS — M955 Acquired deformity of pelvis: Secondary | ICD-10-CM | POA: Diagnosis not present

## 2014-06-21 DIAGNOSIS — M999 Biomechanical lesion, unspecified: Secondary | ICD-10-CM | POA: Diagnosis not present

## 2014-06-23 DIAGNOSIS — M9903 Segmental and somatic dysfunction of lumbar region: Secondary | ICD-10-CM | POA: Diagnosis not present

## 2014-06-23 DIAGNOSIS — M4317 Spondylolisthesis, lumbosacral region: Secondary | ICD-10-CM | POA: Diagnosis not present

## 2014-06-23 DIAGNOSIS — M5416 Radiculopathy, lumbar region: Secondary | ICD-10-CM | POA: Diagnosis not present

## 2014-06-23 DIAGNOSIS — M9904 Segmental and somatic dysfunction of sacral region: Secondary | ICD-10-CM | POA: Diagnosis not present

## 2014-06-29 DIAGNOSIS — M5416 Radiculopathy, lumbar region: Secondary | ICD-10-CM | POA: Diagnosis not present

## 2014-06-29 DIAGNOSIS — M9903 Segmental and somatic dysfunction of lumbar region: Secondary | ICD-10-CM | POA: Diagnosis not present

## 2014-06-29 DIAGNOSIS — M9904 Segmental and somatic dysfunction of sacral region: Secondary | ICD-10-CM | POA: Diagnosis not present

## 2014-06-29 DIAGNOSIS — M4317 Spondylolisthesis, lumbosacral region: Secondary | ICD-10-CM | POA: Diagnosis not present

## 2014-07-02 ENCOUNTER — Other Ambulatory Visit: Payer: Self-pay | Admitting: Family Medicine

## 2014-07-06 DIAGNOSIS — M9904 Segmental and somatic dysfunction of sacral region: Secondary | ICD-10-CM | POA: Diagnosis not present

## 2014-07-06 DIAGNOSIS — M9903 Segmental and somatic dysfunction of lumbar region: Secondary | ICD-10-CM | POA: Diagnosis not present

## 2014-07-06 DIAGNOSIS — M4317 Spondylolisthesis, lumbosacral region: Secondary | ICD-10-CM | POA: Diagnosis not present

## 2014-07-06 DIAGNOSIS — M5416 Radiculopathy, lumbar region: Secondary | ICD-10-CM | POA: Diagnosis not present

## 2014-07-11 DIAGNOSIS — M25562 Pain in left knee: Secondary | ICD-10-CM | POA: Diagnosis not present

## 2014-07-13 DIAGNOSIS — M9904 Segmental and somatic dysfunction of sacral region: Secondary | ICD-10-CM | POA: Diagnosis not present

## 2014-07-13 DIAGNOSIS — M5416 Radiculopathy, lumbar region: Secondary | ICD-10-CM | POA: Diagnosis not present

## 2014-07-13 DIAGNOSIS — M4317 Spondylolisthesis, lumbosacral region: Secondary | ICD-10-CM | POA: Diagnosis not present

## 2014-07-13 DIAGNOSIS — M9903 Segmental and somatic dysfunction of lumbar region: Secondary | ICD-10-CM | POA: Diagnosis not present

## 2014-07-19 DIAGNOSIS — Z23 Encounter for immunization: Secondary | ICD-10-CM | POA: Diagnosis not present

## 2014-08-17 DIAGNOSIS — M5416 Radiculopathy, lumbar region: Secondary | ICD-10-CM | POA: Diagnosis not present

## 2014-08-17 DIAGNOSIS — M9903 Segmental and somatic dysfunction of lumbar region: Secondary | ICD-10-CM | POA: Diagnosis not present

## 2014-08-17 DIAGNOSIS — M9904 Segmental and somatic dysfunction of sacral region: Secondary | ICD-10-CM | POA: Diagnosis not present

## 2014-08-17 DIAGNOSIS — M4317 Spondylolisthesis, lumbosacral region: Secondary | ICD-10-CM | POA: Diagnosis not present

## 2014-09-06 ENCOUNTER — Other Ambulatory Visit: Payer: Self-pay | Admitting: Family Medicine

## 2014-09-07 NOTE — Telephone Encounter (Signed)
Received refill request electronically from pharmacy. Last office visit 01/28/14. Is it okay to send refill to mail order for 90 days and have patient schedule an appointment for further refills?

## 2014-09-08 NOTE — Telephone Encounter (Signed)
Sent, yes, please schedule.  Thanks.

## 2014-09-08 NOTE — Telephone Encounter (Signed)
Left detailed message on voicemail.  

## 2014-10-04 ENCOUNTER — Encounter: Payer: Self-pay | Admitting: Cardiology

## 2014-10-04 DIAGNOSIS — Z961 Presence of intraocular lens: Secondary | ICD-10-CM | POA: Diagnosis not present

## 2014-10-04 DIAGNOSIS — I10 Essential (primary) hypertension: Secondary | ICD-10-CM | POA: Diagnosis not present

## 2014-10-04 DIAGNOSIS — E785 Hyperlipidemia, unspecified: Secondary | ICD-10-CM | POA: Diagnosis not present

## 2014-10-04 DIAGNOSIS — E119 Type 2 diabetes mellitus without complications: Secondary | ICD-10-CM | POA: Diagnosis not present

## 2014-10-04 DIAGNOSIS — I2510 Atherosclerotic heart disease of native coronary artery without angina pectoris: Secondary | ICD-10-CM | POA: Diagnosis not present

## 2014-10-04 DIAGNOSIS — H52203 Unspecified astigmatism, bilateral: Secondary | ICD-10-CM | POA: Diagnosis not present

## 2014-10-04 LAB — HM DIABETES EYE EXAM

## 2014-10-04 NOTE — Progress Notes (Signed)
Patient ID: TEXAS SOUTER, male   DOB: [birth year], 81 y.o.   MRN: [id number]   Paul Terrell, Paul Terrell    Date of visit:  10/04/2014 DOB:  1930-11-19    Age:  81 yrs. Medical record number:  [id number]     Account number:  [id number] Primary Care Provider: Chrissie Noa ____________________________ CURRENT DIAGNOSES  1. Atherosclerotic heart disease of native coronary artery without angina pectoris  2. Hyperlipidemia, unspecified  3. Essential (primary) hypertension  4. Stent Placement  5. Diabetes Mellitus-NIDD/circ dis/controlled  6. Type 2 diabetes mellitus with diabetic polyneuropathy ____________________________ ALLERGIES  No Known Allergies ____________________________ MEDICATIONS  1. Vitamin B-12 500 mcg tablet, 1 p.o. daily  2. Flomax 0.4 mg capsule,extended release 24hr, 1 p.o. daily  3. glyburide 5 mg tablet, 1 p.o. daily  4. metformin 1,000 mg tablet, B.I.D.  5. multivitamin tablet, 1 p.o. q.d.  6. Fish Oil 1,000 mg Capsule, 1 p.o. daily  7. finasteride 5 mg tablet, 1 p.o. daily  8. Vitamin D3 5,000 unit tablet, 1 p.o. daily  9. vitamin E 400 unit capsule, 1 p.o. daily  10. CoQ-10 100 mg capsule, 200mg  qd  11. aspirin 81 mg chewable tablet, 1 p.o. daily  12. nitroglycerin 0.4 mg sublingual tablet, prn chest pain  13. lisinopril 20 mg-hydrochlorothiazide 25 mg tablet, 1 p.o. daily  14. pravastatin 20 mg tablet, 1 p.o. daily  15. metoprolol succinate ER 50 mg tablet,extended release 24 hr, 1 p.o. daily ____________________________ CHIEF COMPLAINTS  Followup of CAD,Native ____________________________ HISTORY OF PRESENT ILLNESS Patient seen for cardiac followup. He has been doing well since he was previously here. He is exercising on a regular basis and his lipids were checked today and show excellent control. He denies angina and has no PND, orthopnea, syncope, palpitations, or claudication. He has had no recurrence of angina since he was here last year. He evidently had some cramping  in his legs and according to old records may have been switched to pravastatin however records shows that he is on simvastatin today.  ____________________________ PAST HISTORY  Past Medical Illnesses:  hypertension, DM-non-insulin dependent, hyperlipidemia, hiatal hernia, kidney stones, BPH;  Cardiovascular Illnesses:  CAD;  Surgical Procedures:  arthroscopic knee surgery, laminectomy cervical, bunion surgery on foot, kidney stone, left rotator cuff surgery, cataract extraction OU;  NYHA Classification:  I;  Canadian Angina Classification:  Class 0: Asymptomatic;  Cardiology Procedures-Invasive:  cardiac cath (left), stent RCA October 2001;  Cardiology Procedures-Noninvasive:  lexiscan cardiolite March 2012;  Cardiac Cath Results:  normal Left main, 30% stenosis distal LAD, 40% stenosis mid LAD, 90% stenosis proximal Diag 1, 90% stenosis mid RCA, no significant disease CFX;  LVEF of 73% documented via nuclear study on 12/13/2010,   ____________________________ CARDIO-PULMONARY TEST DATES EKG Date:  10/09/2010;   Cardiac Cath Date:  07/17/2000;  Stent Placement Date: 07/17/2000;  Nuclear Study Date:  12/13/2010;  Chest Xray Date: 05/29/2000;   ____________________________ FAMILY HISTORY Brother -- Brother dead, Myocardial infarction at less than 28 Brother -- Brother alive and well Brother -- Brother dead, Coronary Artery Disease, Liver disease Father -- Father dead, Myocardial infarction Mother -- Mother dead, CVA, Coronary Artery Disease Sister -- Sister alive with problem, Diabetes mellitus Sister -- Sister alive with problem ____________________________ SOCIAL HISTORY Alcohol Use:  no alcohol use;  Smoking:  used to smoke but quit Prior to 1980;  Diet:  diabetic diet;  Lifestyle:  married, 1 son and 1 daughter;  Exercise:  some exercise;  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 Cardiovascular:  please review HPI Abdominal: denies dyspepsia, GI bleeding, constipation, or diarrhea Genitourinary-Male: hesitancy  Musculoskeletal:  arthritis of the right hand, torn rotator cuff right  ____________________________ PHYSICAL EXAMINATION VITAL SIGNS  Blood Pressure:  150/60 Sitting, Right arm, regular cuff  , 154/62 Standing, Right arm and regular cuff   Pulse:  64/min. Weight:  175.00 lbs. Height:  68"BMI: 26  Constitutional:  pleasant white male in no acute distress, mildly obese Skin:  occasional sebaceous cysts Head:  normocephalic, normal hair pattern, no masses or tenderness ENT:  bilateral hearing aides present Neck:  supple, no masses, thyromegaly, JVD. Carotid pulses are full and equal bilaterally without bruits. Chest:  normal symmetry, clear to auscultation. Cardiac:  regular rhythm, normal S1 and S2, No S3 or S4, no murmurs, gallops or rubs detected. Peripheral Pulses:  the femoral,dorsalis pedis, and posterior tibial pulses are full and equal bilaterally with no bruits auscultated. Extremities & Back:  no deformities, clubbing, cyanosis, erythema or edema observed. Normal muscle strength and tone. Neurological:  no gross motor or sensory deficits noted, affect appropriate, oriented x3. ____________________________ MOST RECENT LIPID PANEL 10/25/13  CHOL TOTL 133 mg/dl, LDL 59 NM, HDL 43 mg/dl, TRIGLYCER 157 mg/dl and CHOL/HDL 3 (Calc) ____________________________ IMPRESSIONS/PLAN  1. Coronary artery disease with previous stenting many years ago 2. Hypertension currently controlled 3. Type 2 diabetes with neuropathy currently stable 4. Hyperlipidemia under good control  Recommendations:  No angina since last year and should continue exercise. Main limitation is peripheral neuropathy. Clinically doing well in  followup again in one year. ____________________________ TODAYS ORDERS  1. Return Visit: 1 year  2. 12 Lead EKG: 1 year                       ____________________________ Cardiology Physician:  Kerry Hough MD [facility]

## 2014-10-06 ENCOUNTER — Other Ambulatory Visit: Payer: Self-pay | Admitting: Family Medicine

## 2014-10-20 ENCOUNTER — Encounter: Payer: Self-pay | Admitting: Family Medicine

## 2014-10-23 ENCOUNTER — Other Ambulatory Visit: Payer: Self-pay | Admitting: Family Medicine

## 2014-10-23 DIAGNOSIS — E1159 Type 2 diabetes mellitus with other circulatory complications: Secondary | ICD-10-CM

## 2014-10-27 ENCOUNTER — Other Ambulatory Visit (INDEPENDENT_AMBULATORY_CARE_PROVIDER_SITE_OTHER): Payer: Medicare Other

## 2014-10-27 DIAGNOSIS — E1151 Type 2 diabetes mellitus with diabetic peripheral angiopathy without gangrene: Secondary | ICD-10-CM | POA: Diagnosis not present

## 2014-10-27 DIAGNOSIS — E1159 Type 2 diabetes mellitus with other circulatory complications: Secondary | ICD-10-CM

## 2014-10-27 LAB — COMPREHENSIVE METABOLIC PANEL
ALT: 19 U/L (ref 0–53)
AST: 20 U/L (ref 0–37)
Albumin: 4 g/dL (ref 3.5–5.2)
Alkaline Phosphatase: 52 U/L (ref 39–117)
BUN: 21 mg/dL (ref 6–23)
CO2: 28 mEq/L (ref 19–32)
Calcium: 9.4 mg/dL (ref 8.4–10.5)
Chloride: 101 mEq/L (ref 96–112)
Creatinine, Ser: 1.26 mg/dL (ref 0.40–1.50)
GFR: 58.3 mL/min — ABNORMAL LOW (ref >60.00)
Glucose, Bld: 175 mg/dL — ABNORMAL HIGH (ref 70–99)
Potassium: 4.5 mEq/L (ref 3.5–5.1)
Sodium: 137 mEq/L (ref 135–145)
Total Bilirubin: 0.6 mg/dL (ref 0.2–1.2)
Total Protein: 6.5 g/dL (ref 6.0–8.3)

## 2014-10-27 LAB — LIPID PANEL
Cholesterol: 171 mg/dL (ref 0–200)
HDL: 41.1 mg/dL (ref >39.00)
LDL Cholesterol: 91 mg/dL (ref 0–99)
NonHDL: 129.9
Total CHOL/HDL Ratio: 4
Triglycerides: 195 mg/dL — ABNORMAL HIGH (ref 0.0–149.0)
VLDL: 39 mg/dL (ref 0.0–40.0)

## 2014-10-27 LAB — HEMOGLOBIN A1C: Hgb A1c MFr Bld: 7.4 % — ABNORMAL HIGH (ref 4.6–6.5)

## 2014-11-03 ENCOUNTER — Encounter: Payer: Self-pay | Admitting: Family Medicine

## 2014-11-03 ENCOUNTER — Ambulatory Visit (INDEPENDENT_AMBULATORY_CARE_PROVIDER_SITE_OTHER): Payer: Medicare Other | Admitting: Family Medicine

## 2014-11-03 VITALS — BP 134/56 | HR 64 | Temp 97.3°F | Ht 67.0 in | Wt 179.2 lb

## 2014-11-03 DIAGNOSIS — E1151 Type 2 diabetes mellitus with diabetic peripheral angiopathy without gangrene: Secondary | ICD-10-CM | POA: Diagnosis not present

## 2014-11-03 DIAGNOSIS — Z0000 Encounter for general adult medical examination without abnormal findings: Secondary | ICD-10-CM

## 2014-11-03 DIAGNOSIS — E119 Type 2 diabetes mellitus without complications: Secondary | ICD-10-CM | POA: Diagnosis not present

## 2014-11-03 DIAGNOSIS — Z7189 Other specified counseling: Secondary | ICD-10-CM

## 2014-11-03 DIAGNOSIS — I119 Hypertensive heart disease without heart failure: Secondary | ICD-10-CM | POA: Diagnosis not present

## 2014-11-03 DIAGNOSIS — E1159 Type 2 diabetes mellitus with other circulatory complications: Secondary | ICD-10-CM

## 2014-11-03 DIAGNOSIS — E785 Hyperlipidemia, unspecified: Secondary | ICD-10-CM

## 2014-11-03 MED ORDER — TAMSULOSIN HCL 0.4 MG PO CAPS: ORAL_CAPSULE | ORAL | Status: DC

## 2014-11-03 MED ORDER — METFORMIN HCL 1000 MG PO TABS: ORAL_TABLET | ORAL | Status: DC

## 2014-11-03 MED ORDER — METOPROLOL SUCCINATE ER 50 MG PO TB24: ORAL_TABLET | ORAL | Status: DC

## 2014-11-03 MED ORDER — FINASTERIDE 5 MG PO TABS: 5.0000 mg | ORAL_TABLET | Freq: Every day | ORAL | Status: DC

## 2014-11-03 MED ORDER — PRAVASTATIN SODIUM 20 MG PO TABS: 20.0000 mg | ORAL_TABLET | Freq: Every day | ORAL | Status: DC

## 2014-11-03 MED ORDER — GLYBURIDE 5 MG PO TABS: 5.0000 mg | ORAL_TABLET | Freq: Every morning | ORAL | Status: DC

## 2014-11-03 NOTE — Progress Notes (Signed)
Pre visit review using our clinic review tool, if applicable. No additional management support is needed unless otherwise documented below in the visit note.  I have personally reviewed the Medicare Annual Wellness questionnaire and have noted 1. The patient's medical and social history 2. Their use of alcohol, tobacco or illicit drugs 3. Their current medications and supplements 4. The patient's functional ability including ADL's, fall risks, home safety risks and hearing or visual             impairment. 5. Diet and physical activities 6. Evidence for depression or mood disorders  The patients weight, height, BMI have been recorded in the chart and visual acuity is per eye clinic.  I have made referrals, counseling and provided education to the patient based review of the above and I have provided the pt with a written personalized care plan for preventive services.  Provider list updated- see scanned forms.  Routine anticipatory guidance given to patient.  See health maintenance.  Flu 2015 Shingles prev done PNA 2000 Tetanus 2005, d/w pt.  Colon CA screen NA due to age, patient agrees Prostate cancer screening NA due to age, patient agrees.  Advance directive- wife designated if patient were incapacitated.   Cognitive function addressed- see scanned forms- and if abnormal then additional documentation follows.   Hypertension:    Using medication without problems or lightheadedness: yes Chest pain with exertion:no Edema:rare, limited.  Short of breath: not at baseline.  He has age expected changes, "I outwork other people in their 27s"  Elevated Cholesterol: Using medications without problems:yes Muscle aches: no Diet compliance:yes Exercise:encouraged Labs d/w pt.  TG and LDL up some, but prev had aches on other statin and is likely maxed out on current med.  D/w pt.   Diabetes:  Using medications without difficulties:yes Hypoglycemic episodes:no Hyperglycemic  episodes:no Feet problems: some tingling at baseline Blood Sugars averaging: ~170 now, had been work with diet non-adherence, he's back on DM2 and sugar improving.  eye exam within last year: yes 09/2014  PMH and SH reviewed  Meds, vitals, and allergies reviewed.   ROS: See HPI.  Otherwise negative.    GEN: nad, alert and oriented HEENT: mucous membranes moist NECK: supple w/o LA CV: rrr. PULM: ctab, no inc wob ABD: soft, +bs EXT: no edema SKIN: no acute rash  Diabetic foot exam: Normal inspection No skin breakdown No calluses  Normal DP pulses Dec sensation to light touch and monofilament B Nails normal

## 2014-11-03 NOTE — Patient Instructions (Addendum)
Recheck labs before a visit in about 3 months.  Keep working on your diet for now.  Don't change your meds.   Glad to see you.  Take care.   Check to see if the tetanus shot will be cheaper at the pharmacy.

## 2014-11-04 DIAGNOSIS — Z7189 Other specified counseling: Secondary | ICD-10-CM | POA: Insufficient documentation

## 2014-11-04 NOTE — Assessment & Plan Note (Signed)
Now with some neuropathy.  He'll work on diet and recheck labs in about 3 months.  He agrees.

## 2014-11-04 NOTE — Assessment & Plan Note (Signed)
Flu 2015 Shingles prev done PNA 2000 Tetanus 2005, d/w pt.  Colon CA screen NA due to age, patient agrees Prostate cancer screening NA due to age, patient agrees.  Advance directive- wife designated if patient were incapacitated.   Cognitive function addressed- see scanned forms- and if abnormal then additional documentation follows.

## 2014-11-04 NOTE — Assessment & Plan Note (Signed)
BP reasonable, continue as is.  Labs d/w pt.  He agrees.

## 2014-11-04 NOTE — Assessment & Plan Note (Signed)
Labs d/w pt. TG and LDL up some, but prev had aches on other statin and is likely maxed out on current med. D/w pt.  Continue as is with med.  He'll work on diet and weight.

## 2014-11-24 ENCOUNTER — Other Ambulatory Visit: Payer: Self-pay | Admitting: Family Medicine

## 2014-12-06 ENCOUNTER — Other Ambulatory Visit: Payer: Self-pay | Admitting: *Deleted

## 2014-12-06 MED ORDER — METFORMIN HCL 1000 MG PO TABS: ORAL_TABLET | ORAL | Status: DC

## 2014-12-06 MED ORDER — PRAVASTATIN SODIUM 20 MG PO TABS: 20.0000 mg | ORAL_TABLET | Freq: Every day | ORAL | Status: DC

## 2014-12-08 ENCOUNTER — Encounter: Payer: Self-pay | Admitting: Family Medicine

## 2014-12-09 DIAGNOSIS — D225 Melanocytic nevi of trunk: Secondary | ICD-10-CM | POA: Diagnosis not present

## 2014-12-09 DIAGNOSIS — L814 Other melanin hyperpigmentation: Secondary | ICD-10-CM | POA: Diagnosis not present

## 2014-12-09 DIAGNOSIS — D1801 Hemangioma of skin and subcutaneous tissue: Secondary | ICD-10-CM | POA: Diagnosis not present

## 2014-12-09 DIAGNOSIS — L570 Actinic keratosis: Secondary | ICD-10-CM | POA: Diagnosis not present

## 2014-12-09 DIAGNOSIS — L821 Other seborrheic keratosis: Secondary | ICD-10-CM | POA: Diagnosis not present

## 2014-12-14 ENCOUNTER — Other Ambulatory Visit: Payer: Self-pay | Admitting: Family Medicine

## 2015-02-01 ENCOUNTER — Other Ambulatory Visit (INDEPENDENT_AMBULATORY_CARE_PROVIDER_SITE_OTHER): Payer: Medicare Other

## 2015-02-01 DIAGNOSIS — E119 Type 2 diabetes mellitus without complications: Secondary | ICD-10-CM

## 2015-02-01 LAB — BASIC METABOLIC PANEL
BUN: 21 mg/dL (ref 6–23)
CO2: 30 mEq/L (ref 19–32)
Calcium: 9.5 mg/dL (ref 8.4–10.5)
Chloride: 102 mEq/L (ref 96–112)
Creatinine, Ser: 1.02 mg/dL (ref 0.40–1.50)
GFR: 74.35 mL/min (ref >60.00)
Glucose, Bld: 176 mg/dL — ABNORMAL HIGH (ref 70–99)
Potassium: 4.6 mEq/L (ref 3.5–5.1)
Sodium: 136 mEq/L (ref 135–145)

## 2015-02-01 LAB — HEMOGLOBIN A1C: Hgb A1c MFr Bld: 7.3 % — ABNORMAL HIGH (ref 4.6–6.5)

## 2015-02-03 ENCOUNTER — Encounter: Payer: Self-pay | Admitting: Family Medicine

## 2015-02-03 ENCOUNTER — Ambulatory Visit (INDEPENDENT_AMBULATORY_CARE_PROVIDER_SITE_OTHER): Payer: Medicare Other | Admitting: Family Medicine

## 2015-02-03 VITALS — BP 142/50 | HR 65 | Temp 98.4°F | Wt 176.8 lb

## 2015-02-03 DIAGNOSIS — E1151 Type 2 diabetes mellitus with diabetic peripheral angiopathy without gangrene: Secondary | ICD-10-CM

## 2015-02-03 DIAGNOSIS — I119 Hypertensive heart disease without heart failure: Secondary | ICD-10-CM | POA: Diagnosis not present

## 2015-02-03 DIAGNOSIS — E1159 Type 2 diabetes mellitus with other circulatory complications: Secondary | ICD-10-CM

## 2015-02-03 MED ORDER — LISINOPRIL-HYDROCHLOROTHIAZIDE 20-25 MG PO TABS: 0.5000 | ORAL_TABLET | Freq: Every day | ORAL | Status: DC

## 2015-02-03 NOTE — Patient Instructions (Addendum)
Try cutting the lisinopril in half and see if you feel better (less lightheaded).  Update me as needed.  Recheck in about 4 months, with labs ahead of time.  Take care.  Glad to see you.

## 2015-02-03 NOTE — Progress Notes (Signed)
Pre visit review using our clinic review tool, if applicable. No additional management support is needed unless otherwise documented below in the visit note.  He has been lightheaded occ with standing.  D/w pt.  Still on baseline BP meds.  No CP, not SOB.    Diabetes:  Using medications without difficulties:yes Hypoglycemic episodes:no Hyperglycemic episodes:no Feet problems: some mild tingling in feet, but not worse, likely better recently per patient  Blood Sugars averaging: 130-160s eye exam within last year:yes A1c stable We talked about trying to avoid lows.   He agrees.     Meds, vitals, and allergies reviewed.   ROS: See HPI.  Otherwise, noncontributory.  GEN: nad, alert and oriented HEENT: mucous membranes moist NECK: supple w/o LA CV: rrr.  PULM: ctab, no inc wob ABD: soft, +bs EXT: no edema

## 2015-02-05 NOTE — Assessment & Plan Note (Signed)
With low DBP noted.   Will try cutting the lisinopril in half and he'll see if he feels better (less lightheaded). Can update me as needed.  Recheck in about 4 months, with labs ahead of time.  He agrees.

## 2015-02-05 NOTE — Assessment & Plan Note (Signed)
Reasonable control, given his age.  No change in meds.  Continue work on diet.  He agrees.  Recheck in about 4 months, with labs ahead of time.

## 2015-06-06 ENCOUNTER — Ambulatory Visit: Payer: Medicare Other | Admitting: Family Medicine

## 2015-06-09 ENCOUNTER — Ambulatory Visit (INDEPENDENT_AMBULATORY_CARE_PROVIDER_SITE_OTHER): Payer: Medicare Other | Admitting: Family Medicine

## 2015-06-09 ENCOUNTER — Encounter: Payer: Self-pay | Admitting: Family Medicine

## 2015-06-09 VITALS — BP 130/52 | HR 54 | Temp 98.3°F | Wt 171.8 lb

## 2015-06-09 DIAGNOSIS — E119 Type 2 diabetes mellitus without complications: Secondary | ICD-10-CM

## 2015-06-09 DIAGNOSIS — Z23 Encounter for immunization: Secondary | ICD-10-CM

## 2015-06-09 DIAGNOSIS — E1151 Type 2 diabetes mellitus with diabetic peripheral angiopathy without gangrene: Secondary | ICD-10-CM

## 2015-06-09 DIAGNOSIS — E1159 Type 2 diabetes mellitus with other circulatory complications: Secondary | ICD-10-CM

## 2015-06-09 LAB — BASIC METABOLIC PANEL
BUN: 22 mg/dL (ref 6–23)
CO2: 27 mEq/L (ref 19–32)
Calcium: 9.2 mg/dL (ref 8.4–10.5)
Chloride: 102 mEq/L (ref 96–112)
Creatinine, Ser: 1.08 mg/dL (ref 0.40–1.50)
GFR: 69.54 mL/min (ref >60.00)
Glucose, Bld: 212 mg/dL — ABNORMAL HIGH (ref 70–99)
Potassium: 4.5 mEq/L (ref 3.5–5.1)
Sodium: 138 mEq/L (ref 135–145)

## 2015-06-09 NOTE — Patient Instructions (Addendum)
Go to the lab on the way out.  We'll contact you with your lab report. Stretch your back and hamstrings gently.  Keep using a pillow for your legs at night.   Don't change your meds Recheck at a physical in February of 2017, labs ahead of time.  Take care.  Glad to see you.

## 2015-06-09 NOTE — Progress Notes (Signed)
Pre visit review using our clinic review tool, if applicable. No additional management support is needed unless otherwise documented below in the visit note.  Diabetes:  Using medications without difficulties:yes Hypoglycemic episodes:no Hyperglycemic episodes:no Feet problems: numbness at baseline.  More pain at night.  He has had some R leg pain that moves down the R leg, is only going on at night and that appears to be positional, ie improved with using a pillow for his legs.  He had some leg cramping but that is better recently.  No weakness.  He has some back pain with yard work.   He wants to continue as is with meds and not add extra meds at this point, d/w pt.   Blood Sugars averaging: usually ~115-150 eye exam within last year: yes He is less lightheaded than prev.   Due for labs.    Meds, vitals, and allergies reviewed.   ROS: See HPI.  Otherwise negative.    GEN: nad, alert and oriented HEENT: mucous membranes moist NECK: supple w/o LA CV: rrr. PULM: ctab, no inc wob ABD: soft, +bs EXT: no edema SKIN: no acute rash Tight hamstrings noted B but SLR neg o/w B  Diabetic foot exam: Normal inspection No skin breakdown No calluses  Normal DP pulses Normal sensation to light touch and monofilament Nails thickened.

## 2015-06-12 ENCOUNTER — Other Ambulatory Visit (INDEPENDENT_AMBULATORY_CARE_PROVIDER_SITE_OTHER): Payer: Medicare Other

## 2015-06-12 DIAGNOSIS — E119 Type 2 diabetes mellitus without complications: Secondary | ICD-10-CM | POA: Diagnosis not present

## 2015-06-12 LAB — HEMOGLOBIN A1C: Hgb A1c MFr Bld: 7.4 % — ABNORMAL HIGH (ref 4.6–6.5)

## 2015-06-12 NOTE — Assessment & Plan Note (Signed)
See notes on labs.  The leg sx seem to be positional and not alarming, d/w pt.  He'll update me if not better with stretching.

## 2015-06-14 ENCOUNTER — Other Ambulatory Visit: Payer: Medicare Other

## 2015-09-12 ENCOUNTER — Other Ambulatory Visit: Payer: Self-pay | Admitting: Family Medicine

## 2015-09-28 DIAGNOSIS — L570 Actinic keratosis: Secondary | ICD-10-CM | POA: Diagnosis not present

## 2015-09-28 DIAGNOSIS — D1801 Hemangioma of skin and subcutaneous tissue: Secondary | ICD-10-CM | POA: Diagnosis not present

## 2015-09-28 DIAGNOSIS — Z85828 Personal history of other malignant neoplasm of skin: Secondary | ICD-10-CM | POA: Diagnosis not present

## 2015-09-28 DIAGNOSIS — L812 Freckles: Secondary | ICD-10-CM | POA: Diagnosis not present

## 2015-09-28 DIAGNOSIS — L299 Pruritus, unspecified: Secondary | ICD-10-CM | POA: Diagnosis not present

## 2015-09-28 DIAGNOSIS — L821 Other seborrheic keratosis: Secondary | ICD-10-CM | POA: Diagnosis not present

## 2015-09-28 DIAGNOSIS — D225 Melanocytic nevi of trunk: Secondary | ICD-10-CM | POA: Diagnosis not present

## 2015-10-03 DIAGNOSIS — E785 Hyperlipidemia, unspecified: Secondary | ICD-10-CM | POA: Diagnosis not present

## 2015-10-03 DIAGNOSIS — I2510 Atherosclerotic heart disease of native coronary artery without angina pectoris: Secondary | ICD-10-CM | POA: Diagnosis not present

## 2015-10-03 DIAGNOSIS — I10 Essential (primary) hypertension: Secondary | ICD-10-CM | POA: Diagnosis not present

## 2015-10-13 DIAGNOSIS — Z961 Presence of intraocular lens: Secondary | ICD-10-CM | POA: Diagnosis not present

## 2015-10-13 DIAGNOSIS — H52203 Unspecified astigmatism, bilateral: Secondary | ICD-10-CM | POA: Diagnosis not present

## 2015-10-13 DIAGNOSIS — E119 Type 2 diabetes mellitus without complications: Secondary | ICD-10-CM | POA: Diagnosis not present

## 2015-10-13 LAB — HM DIABETES EYE EXAM

## 2015-10-16 ENCOUNTER — Other Ambulatory Visit: Payer: Self-pay | Admitting: Family Medicine

## 2015-10-17 ENCOUNTER — Encounter: Payer: Self-pay | Admitting: Family Medicine

## 2015-10-27 ENCOUNTER — Other Ambulatory Visit: Payer: Self-pay | Admitting: Family Medicine

## 2015-10-27 DIAGNOSIS — E1159 Type 2 diabetes mellitus with other circulatory complications: Secondary | ICD-10-CM

## 2015-11-01 ENCOUNTER — Other Ambulatory Visit (INDEPENDENT_AMBULATORY_CARE_PROVIDER_SITE_OTHER): Payer: Medicare Other

## 2015-11-01 DIAGNOSIS — E1159 Type 2 diabetes mellitus with other circulatory complications: Secondary | ICD-10-CM

## 2015-11-01 LAB — LIPID PANEL
Cholesterol: 161 mg/dL (ref 0–200)
HDL: 40.8 mg/dL (ref >39.00)
LDL Cholesterol: 88 mg/dL (ref 0–99)
NonHDL: 120.18
Total CHOL/HDL Ratio: 4
Triglycerides: 162 mg/dL — ABNORMAL HIGH (ref 0.0–149.0)
VLDL: 32.4 mg/dL (ref 0.0–40.0)

## 2015-11-01 LAB — COMPREHENSIVE METABOLIC PANEL
ALT: 19 U/L (ref 0–53)
AST: 20 U/L (ref 0–37)
Albumin: 4 g/dL (ref 3.5–5.2)
Alkaline Phosphatase: 49 U/L (ref 39–117)
BUN: 17 mg/dL (ref 6–23)
CO2: 28 mEq/L (ref 19–32)
Calcium: 9.5 mg/dL (ref 8.4–10.5)
Chloride: 101 mEq/L (ref 96–112)
Creatinine, Ser: 1 mg/dL (ref 0.40–1.50)
GFR: 75.93 mL/min (ref >60.00)
Glucose, Bld: 165 mg/dL — ABNORMAL HIGH (ref 70–99)
Potassium: 4.5 mEq/L (ref 3.5–5.1)
Sodium: 137 mEq/L (ref 135–145)
Total Bilirubin: 0.5 mg/dL (ref 0.2–1.2)
Total Protein: 6.6 g/dL (ref 6.0–8.3)

## 2015-11-01 LAB — HEMOGLOBIN A1C: Hgb A1c MFr Bld: 7.7 % — ABNORMAL HIGH (ref 4.6–6.5)

## 2015-11-07 ENCOUNTER — Ambulatory Visit (INDEPENDENT_AMBULATORY_CARE_PROVIDER_SITE_OTHER)
Admission: RE | Admit: 2015-11-07 | Discharge: 2015-11-07 | Disposition: A | Discharge status: Home / Self Care | Payer: Medicare Other | Source: Ambulatory Visit | Attending: Family Medicine | Admitting: Family Medicine

## 2015-11-07 ENCOUNTER — Encounter: Payer: Self-pay | Admitting: Family Medicine

## 2015-11-07 ENCOUNTER — Ambulatory Visit (INDEPENDENT_AMBULATORY_CARE_PROVIDER_SITE_OTHER): Payer: Medicare Other | Admitting: Family Medicine

## 2015-11-07 VITALS — BP 154/54 | HR 68 | Temp 97.7°F | Ht 67.0 in | Wt 177.5 lb

## 2015-11-07 DIAGNOSIS — I119 Hypertensive heart disease without heart failure: Secondary | ICD-10-CM | POA: Diagnosis not present

## 2015-11-07 DIAGNOSIS — Z0000 Encounter for general adult medical examination without abnormal findings: Secondary | ICD-10-CM | POA: Diagnosis not present

## 2015-11-07 DIAGNOSIS — M545 Low back pain, unspecified: Secondary | ICD-10-CM

## 2015-11-07 DIAGNOSIS — M5136 Other intervertebral disc degeneration, lumbar region: Secondary | ICD-10-CM | POA: Diagnosis not present

## 2015-11-07 DIAGNOSIS — E1159 Type 2 diabetes mellitus with other circulatory complications: Secondary | ICD-10-CM

## 2015-11-07 DIAGNOSIS — E785 Hyperlipidemia, unspecified: Secondary | ICD-10-CM

## 2015-11-07 MED ORDER — METFORMIN HCL 1000 MG PO TABS: 1000.0000 mg | ORAL_TABLET | Freq: Two times a day (BID) | ORAL | Status: DC

## 2015-11-07 MED ORDER — PRAVASTATIN SODIUM 20 MG PO TABS: ORAL_TABLET | ORAL | Status: DC

## 2015-11-07 MED ORDER — TAMSULOSIN HCL 0.4 MG PO CAPS: 0.4000 mg | ORAL_CAPSULE | Freq: Every day | ORAL | Status: DC

## 2015-11-07 MED ORDER — FINASTERIDE 5 MG PO TABS: 5.0000 mg | ORAL_TABLET | Freq: Every day | ORAL | Status: DC

## 2015-11-07 NOTE — Patient Instructions (Addendum)
Recheck in about 4 months, with labs before the visit.  Don't change your meds.   You already had a Tdap shot.   Go to the lab on the way out.  We'll contact you with your xray report. Keep taking tylenol in the meantime for the pain.  Take care.  Glad to see you.

## 2015-11-07 NOTE — Progress Notes (Signed)
Pre visit review using our clinic review tool, if applicable. No additional management support is needed unless otherwise documented below in the visit note.  I have personally reviewed the Medicare Annual Wellness questionnaire and have noted 1. The patient's medical and social history 2. Their use of alcohol, tobacco or illicit drugs 3. Their current medications and supplements 4. The patient's functional ability including ADL's, fall risks, home safety risks and hearing or visual             impairment. 5. Diet and physical activities 6. Evidence for depression or mood disorders  The patients weight, height, BMI have been recorded in the chart and visual acuity is per eye clinic.  I have made referrals, counseling and provided education to the patient based review of the above and I have provided the pt with a written personalized care plan for preventive services.  Provider list updated- see scanned forms.  Routine anticipatory guidance given to patient.  See health maintenance.  Flu up to date Shingles prev done PNA up to date Tetanus up to date Colon CA screen NA due to age, patient agrees Prostate cancer screening NA due to age, patient agrees.  Advance directive- wife designated if patient were incapacitated.  Cognitive function addressed- see scanned forms- and if abnormal then additional documentation follows.   Hypertension:    Using medication without problems or lightheadedness: yes Chest pain with exertion:no Edema:rare, minimal Short of breath: at baseline, with exertion, he thought this was normal for his age and conditioning and I think that is correct.    Elevated Cholesterol: Using medications without problems:yes Muscle aches: no Diet compliance:yes Exercise:yes  Diabetes:  Using medications without difficulties: yes Hypoglycemic episodes: no Hyperglycemic episodes:no Feet problems:no Blood Sugars averaging: ~120-140 eye exam within last year:yet A1c  reasonable for his age.  D/w pt.  Still <8.   He is still having some lower back pain, now with some radiating to the L lateral hip pain for the last 10 days.  No radicular pain down the legs  No weakness.  Not ttp laterally.  No pain sitting but pain with initial walking.  some better after he has been up and walking.  Taking tylenol with some relief.    PMH and SH reviewed  Meds, vitals, and allergies reviewed.   ROS: See HPI.  Otherwise negative.    GEN: nad, alert and oriented HEENT: mucous membranes moist NECK: supple w/o LA CV: rrr. PULM: ctab, no inc wob ABD: soft, +bs EXT: no edema SKIN: no acute rash Lower back ttp and muscle tightness w/o bruising or rash noted.  Normal L hip rom, not ttp over greater troch Able to bear weight.  S/S grossly wnl BLE Likely ganglion noted near the base of the L 2nd digit, not ttp, present for about 1 year per patient report.  D/w pt, reassured.    Diabetic foot exam: Normal inspection No skin breakdown No calluses  Normal DP pulses Normal sensation to light touch and monofilament Nails normal

## 2015-11-08 DIAGNOSIS — M48061 Spinal stenosis, lumbar region without neurogenic claudication: Secondary | ICD-10-CM | POA: Insufficient documentation

## 2015-11-08 NOTE — Assessment & Plan Note (Signed)
See notes on imaging, dw pt.  Likely with DDD sx and possible radicular sx but not true sciatica.

## 2015-11-08 NOTE — Assessment & Plan Note (Signed)
Continue as is.  I didn't inc meds for his SBP given his lower DBP.  He agrees.  Labs d/w pt.

## 2015-11-08 NOTE — Assessment & Plan Note (Signed)
Flu up to date Shingles prev done PNA up to date Tetanus up to date Colon CA screen NA due to age, patient agrees Prostate cancer screening NA due to age, patient agrees.  Advance directive- wife designated if patient were incapacitated.  Cognitive function addressed- see scanned forms- and if abnormal then additional documentation follows.

## 2015-11-08 NOTE — Assessment & Plan Note (Signed)
He's on a statin that he can tolerate, with LDL near goal, I would continue as is.  D/w pt. He agrees.

## 2015-11-08 NOTE — Assessment & Plan Note (Signed)
I don't want to induce hypoglycemia.  No change in meds . He'll work on diet and we'll recheck later in the year.  He agrees.

## 2015-11-09 ENCOUNTER — Telehealth: Payer: Self-pay | Admitting: Family Medicine

## 2015-11-09 NOTE — Telephone Encounter (Signed)
Call returned.  See original result note.

## 2015-11-09 NOTE — Telephone Encounter (Signed)
Pt returned your call.  

## 2015-12-23 ENCOUNTER — Other Ambulatory Visit: Payer: Self-pay | Admitting: Family Medicine

## 2016-02-29 ENCOUNTER — Other Ambulatory Visit (INDEPENDENT_AMBULATORY_CARE_PROVIDER_SITE_OTHER): Payer: Medicare Other

## 2016-02-29 ENCOUNTER — Other Ambulatory Visit: Payer: Self-pay | Admitting: Family Medicine

## 2016-02-29 DIAGNOSIS — E1159 Type 2 diabetes mellitus with other circulatory complications: Secondary | ICD-10-CM | POA: Diagnosis not present

## 2016-02-29 LAB — HEMOGLOBIN A1C: Hgb A1c MFr Bld: 8.1 % — ABNORMAL HIGH (ref 4.6–6.5)

## 2016-03-07 ENCOUNTER — Encounter: Payer: Self-pay | Admitting: Family Medicine

## 2016-03-07 ENCOUNTER — Ambulatory Visit (INDEPENDENT_AMBULATORY_CARE_PROVIDER_SITE_OTHER): Payer: Medicare Other | Admitting: Family Medicine

## 2016-03-07 VITALS — BP 110/52 | HR 71 | Temp 98.5°F | Wt 171.5 lb

## 2016-03-07 DIAGNOSIS — M5441 Lumbago with sciatica, right side: Secondary | ICD-10-CM

## 2016-03-07 DIAGNOSIS — E1159 Type 2 diabetes mellitus with other circulatory complications: Secondary | ICD-10-CM | POA: Diagnosis not present

## 2016-03-07 NOTE — Patient Instructions (Addendum)
Cut back on the fried foods and the potatoes and recheck labs in about 3 months before a visit.  Paul Terrell will call about your referral.  See her on the way out.  Take care.  Glad to see you.

## 2016-03-07 NOTE — Assessment & Plan Note (Signed)
A1c up, d/w pt.  D/w pt about diet. He has been intermittently complaint with diet, eating out more.  They are cooking less at home now.  D/w pt about diet or diet with med change.  He wants to work harder on diet and recheck labs in about 3 months.  This is reasonable.

## 2016-03-07 NOTE — Progress Notes (Signed)
Pre visit review using our clinic review tool, if applicable. No additional management support is needed unless otherwise documented below in the visit note.  Diabetes:  Using medications without difficulties:yes Hypoglycemic episodes:no Hyperglycemic episodes:no Feet problems:some tingling in the feet Blood Sugars averaging: 190 this AM, usually 160+ in the AM.  Has gradually drifted up in the meantime.  eye exam within last year: yes A1c up some, d/w pt.  D/w pt about diet. He has been intermittently complaint with diet, eating out more.  They are cooking less at home now.  D/w pt about diet or diet with med change.  He wants to work harder on diet.    Still with back pain. No pain sitting but pain when getting up or bending over to work in the yard. Clearly gets better sitting down. Failed tx with tylenol.  D/w pt about other meds vs referral.  Still with R>L leg pain, radiating down from the back.    Meds, vitals, and allergies reviewed.   ROS: Per HPI unless specifically indicated in ROS section   GEN: nad, alert and oriented HEENT: mucous membranes moist NECK: supple w/o LA CV: rrr. PULM: ctab, no inc wob ABD: soft, +bs EXT: no edema SKIN: no acute rash Sensation and motor function grossly intact in the BLE

## 2016-03-08 NOTE — Assessment & Plan Note (Signed)
With sx suggestive of spinal stenosis, clearly better sitting than standing.   Still with R>L leg pain, radiating down from the back.   Will get MRI set up and go from there.  He agrees.  >25 minutes spent in face to face time with patient, >50% spent in counselling or coordination of care.

## 2016-03-13 ENCOUNTER — Ambulatory Visit
Admission: RE | Admit: 2016-03-13 | Discharge: 2016-03-13 | Disposition: A | Discharge status: Home / Self Care | Payer: Medicare Other | Source: Ambulatory Visit | Attending: Family Medicine | Admitting: Family Medicine

## 2016-03-13 DIAGNOSIS — M5441 Lumbago with sciatica, right side: Secondary | ICD-10-CM

## 2016-03-13 DIAGNOSIS — M48061 Spinal stenosis, lumbar region without neurogenic claudication: Secondary | ICD-10-CM

## 2016-03-13 DIAGNOSIS — M4806 Spinal stenosis, lumbar region: Secondary | ICD-10-CM | POA: Diagnosis not present

## 2016-03-14 ENCOUNTER — Other Ambulatory Visit: Payer: Self-pay | Admitting: Family Medicine

## 2016-03-14 DIAGNOSIS — M48061 Spinal stenosis, lumbar region without neurogenic claudication: Secondary | ICD-10-CM

## 2016-03-21 DIAGNOSIS — M4806 Spinal stenosis, lumbar region: Secondary | ICD-10-CM | POA: Diagnosis not present

## 2016-03-21 DIAGNOSIS — M4727 Other spondylosis with radiculopathy, lumbosacral region: Secondary | ICD-10-CM | POA: Diagnosis not present

## 2016-04-22 DIAGNOSIS — M4806 Spinal stenosis, lumbar region: Secondary | ICD-10-CM | POA: Diagnosis not present

## 2016-05-01 ENCOUNTER — Other Ambulatory Visit: Payer: Self-pay | Admitting: Family Medicine

## 2016-05-01 NOTE — Telephone Encounter (Signed)
Sent. Thanks.   

## 2016-05-09 DIAGNOSIS — Z6825 Body mass index (BMI) 25.0-25.9, adult: Secondary | ICD-10-CM | POA: Diagnosis not present

## 2016-05-09 DIAGNOSIS — M4727 Other spondylosis with radiculopathy, lumbosacral region: Secondary | ICD-10-CM | POA: Diagnosis not present

## 2016-05-13 DIAGNOSIS — M5416 Radiculopathy, lumbar region: Secondary | ICD-10-CM | POA: Diagnosis not present

## 2016-05-13 DIAGNOSIS — M4727 Other spondylosis with radiculopathy, lumbosacral region: Secondary | ICD-10-CM | POA: Diagnosis not present

## 2016-06-05 DIAGNOSIS — M4727 Other spondylosis with radiculopathy, lumbosacral region: Secondary | ICD-10-CM | POA: Diagnosis not present

## 2016-06-05 DIAGNOSIS — Z6825 Body mass index (BMI) 25.0-25.9, adult: Secondary | ICD-10-CM | POA: Diagnosis not present

## 2016-07-04 DIAGNOSIS — Z23 Encounter for immunization: Secondary | ICD-10-CM | POA: Diagnosis not present

## 2016-07-25 ENCOUNTER — Encounter: Payer: Self-pay | Admitting: Family Medicine

## 2016-07-25 ENCOUNTER — Ambulatory Visit (INDEPENDENT_AMBULATORY_CARE_PROVIDER_SITE_OTHER): Payer: Medicare Other | Admitting: Family Medicine

## 2016-07-25 VITALS — BP 114/52 | HR 62 | Temp 97.6°F | Wt 168.5 lb

## 2016-07-25 DIAGNOSIS — M48061 Spinal stenosis, lumbar region without neurogenic claudication: Secondary | ICD-10-CM | POA: Diagnosis not present

## 2016-07-25 DIAGNOSIS — E119 Type 2 diabetes mellitus without complications: Secondary | ICD-10-CM

## 2016-07-25 DIAGNOSIS — E1159 Type 2 diabetes mellitus with other circulatory complications: Secondary | ICD-10-CM

## 2016-07-25 DIAGNOSIS — I119 Hypertensive heart disease without heart failure: Secondary | ICD-10-CM | POA: Diagnosis not present

## 2016-07-25 LAB — BASIC METABOLIC PANEL
BUN: 19 mg/dL (ref 6–23)
CO2: 26 mEq/L (ref 19–32)
Calcium: 9.5 mg/dL (ref 8.4–10.5)
Chloride: 102 mEq/L (ref 96–112)
Creatinine, Ser: 1.07 mg/dL (ref 0.40–1.50)
GFR: 70.1 mL/min (ref >60.00)
Glucose, Bld: 194 mg/dL — ABNORMAL HIGH (ref 70–99)
Potassium: 4.7 mEq/L (ref 3.5–5.1)
Sodium: 137 mEq/L (ref 135–145)

## 2016-07-25 LAB — HEMOGLOBIN A1C: Hgb A1c MFr Bld: 8.7 % — ABNORMAL HIGH (ref 4.6–6.5)

## 2016-07-25 MED ORDER — GLYBURIDE 5 MG PO TABS: 5.0000 mg | ORAL_TABLET | Freq: Every morning | ORAL | Status: DC

## 2016-07-25 NOTE — Assessment & Plan Note (Signed)
With dec but palpable DP pulses.  Likely with sugar elevation related to prev injections.  Recheck labs today.  See notes on labs.  In the meantime, take an 1.5 tabs of glyburide if morning sugar is above 160.  If <160, then only 1 tab.  He can update me in a few days.  Okay for outpatient f/u.

## 2016-07-25 NOTE — Assessment & Plan Note (Signed)
BP isn't elevated.  Lightheadedness is getting better.  If he continues to have sx, then we can address by tapering his meds.  Okay to continue as is for now.  I think that some of his troubles are related to deconditioning related to his back.

## 2016-07-25 NOTE — Patient Instructions (Signed)
Go to the lab on the way out.  We'll contact you with your lab report. In the meantime, take an extra 1/2 tab of glyburide if your morning sugar is above 160.   Update me in a few days.  Take care.  Glad to see you.

## 2016-07-25 NOTE — Progress Notes (Signed)
Back pain.  Known spinal stenosis, prev MRI documented in [facility]- reviewed report at [place], he was injected but with only temporary relief.  He is trying to decide about surgery.  Still with pain on standing.  Prev with sig sugar elevation after injections.  Back pain is still better when sitting down, as expected.    HTN.  occ lightheaded with position change, improved from prev.  No CP.  He can get SOB with walking but his back pain has limited walking and he likely has some deconditioning.    Diabetes:  Using medications without difficulties: yes Hypoglycemic episodes:no Hyperglycemic episodes: prev with elevations >400 after steroid injection, now in the 200s in the AMs usually, down to 188 this AM.  Feet problems: some tingling in the feet, more at night.  Blood Sugars averaging:see above.  eye exam within last year:yes  PMH and SH reviewed  Meds, vitals, and allergies reviewed.   ROS: Per HPI unless specifically indicated in ROS section   GEN: nad, alert and oriented HEENT: mucous membranes moist NECK: supple w/o LA CV: rrr. PULM: ctab, no inc wob ABD: soft, +bs EXT: no edema  Diabetic foot exam: Normal inspection No skin breakdown No calluses  Dec but intact DP pulses B Normal sensation to light touch and monofilament Nails thickened B 1st nails.

## 2016-07-25 NOTE — Assessment & Plan Note (Signed)
D/w pt about prev MRI results and prev injection.  He is considering surgery.  His age is an issue.  "If I was 4 or 62, I know I would do it."  He is considering options.  Noted that injection did help but only temporarily.  Likely with sugar elevation related to prev injection.  D/w pt.

## 2016-07-25 NOTE — Progress Notes (Signed)
Pre visit review using our clinic review tool, if applicable. No additional management support is needed unless otherwise documented below in the visit note. 

## 2016-07-26 ENCOUNTER — Telehealth: Payer: Self-pay | Admitting: Family Medicine

## 2016-07-26 NOTE — Telephone Encounter (Signed)
Patient returned Shannon's call.  Patient said if he's not available, Lugene can leave a detailed message on his answering machine.

## 2016-07-31 NOTE — Telephone Encounter (Signed)
Patient called stating that he was told to call back with blood sugar readings. Fasting Blood sugars  Sunday 07/28/16 200, Monday 207, Tuesday 113, Wednesday 1014 Blood sugars before dinner Friday 07/26/16 158, Saturday 200, Sunday 197, Monday 246, Tuesday 200

## 2016-08-01 NOTE — Telephone Encounter (Signed)
Patient advised.

## 2016-08-01 NOTE — Telephone Encounter (Signed)
Left message on patient's voicemail to return call

## 2016-08-01 NOTE — Telephone Encounter (Signed)
I would still have him take an 1.5 tabs of glyburide if morning sugar is above 160.  If <160, then only 1 tab.   Please have him also check his sugar before lunch to see how it is running at that point.  I want to know if the extra 0.5 tab is having enough affect.  Please update me in a few days.  We'll go from there.   Thanks.

## 2016-08-20 ENCOUNTER — Other Ambulatory Visit: Payer: Self-pay | Admitting: Family Medicine

## 2016-09-18 ENCOUNTER — Other Ambulatory Visit: Payer: Self-pay | Admitting: Family Medicine

## 2016-09-30 DIAGNOSIS — L820 Inflamed seborrheic keratosis: Secondary | ICD-10-CM | POA: Diagnosis not present

## 2016-09-30 DIAGNOSIS — L309 Dermatitis, unspecified: Secondary | ICD-10-CM | POA: Diagnosis not present

## 2016-09-30 DIAGNOSIS — L821 Other seborrheic keratosis: Secondary | ICD-10-CM | POA: Diagnosis not present

## 2016-09-30 DIAGNOSIS — Z85828 Personal history of other malignant neoplasm of skin: Secondary | ICD-10-CM | POA: Diagnosis not present

## 2016-09-30 DIAGNOSIS — L814 Other melanin hyperpigmentation: Secondary | ICD-10-CM | POA: Diagnosis not present

## 2016-10-01 DIAGNOSIS — I2510 Atherosclerotic heart disease of native coronary artery without angina pectoris: Secondary | ICD-10-CM | POA: Diagnosis not present

## 2016-10-01 DIAGNOSIS — E1142 Type 2 diabetes mellitus with diabetic polyneuropathy: Secondary | ICD-10-CM | POA: Diagnosis not present

## 2016-10-01 DIAGNOSIS — E785 Hyperlipidemia, unspecified: Secondary | ICD-10-CM | POA: Diagnosis not present

## 2016-10-01 DIAGNOSIS — I10 Essential (primary) hypertension: Secondary | ICD-10-CM | POA: Diagnosis not present

## 2016-10-08 ENCOUNTER — Other Ambulatory Visit: Payer: Self-pay | Admitting: Family Medicine

## 2016-10-15 ENCOUNTER — Encounter: Payer: Self-pay | Admitting: Family Medicine

## 2016-10-15 DIAGNOSIS — E119 Type 2 diabetes mellitus without complications: Secondary | ICD-10-CM | POA: Diagnosis not present

## 2016-10-15 DIAGNOSIS — H52203 Unspecified astigmatism, bilateral: Secondary | ICD-10-CM | POA: Diagnosis not present

## 2016-10-15 DIAGNOSIS — Z961 Presence of intraocular lens: Secondary | ICD-10-CM | POA: Diagnosis not present

## 2016-10-15 LAB — HM DIABETES EYE EXAM

## 2016-10-21 ENCOUNTER — Telehealth: Payer: Self-pay | Admitting: Family Medicine

## 2016-10-21 ENCOUNTER — Other Ambulatory Visit: Payer: Self-pay | Admitting: Family Medicine

## 2016-10-21 DIAGNOSIS — E1159 Type 2 diabetes mellitus with other circulatory complications: Secondary | ICD-10-CM

## 2016-10-21 NOTE — Telephone Encounter (Signed)
Spoke with Bea at [place].  Patient got his flu vaccine 07/04/2016-Quadrivalent.  Immunization record updated.

## 2016-10-21 NOTE — Telephone Encounter (Signed)
Patient's wife called to let us know patient had his flu shot at [facility] in October,2017.

## 2016-10-25 ENCOUNTER — Other Ambulatory Visit (INDEPENDENT_AMBULATORY_CARE_PROVIDER_SITE_OTHER): Payer: Medicare Other

## 2016-10-25 DIAGNOSIS — E1159 Type 2 diabetes mellitus with other circulatory complications: Secondary | ICD-10-CM | POA: Diagnosis not present

## 2016-10-25 LAB — HEMOGLOBIN A1C: Hgb A1c MFr Bld: 8.1 % — ABNORMAL HIGH (ref 4.6–6.5)

## 2016-10-29 ENCOUNTER — Ambulatory Visit (INDEPENDENT_AMBULATORY_CARE_PROVIDER_SITE_OTHER): Payer: Medicare Other | Admitting: Family Medicine

## 2016-10-29 ENCOUNTER — Encounter: Payer: Self-pay | Admitting: Family Medicine

## 2016-10-29 DIAGNOSIS — E1159 Type 2 diabetes mellitus with other circulatory complications: Secondary | ICD-10-CM

## 2016-10-29 MED ORDER — GLYBURIDE 5 MG PO TABS: 7.5000 mg | ORAL_TABLET | Freq: Every morning | ORAL | Status: DC

## 2016-10-29 NOTE — Patient Instructions (Signed)
Continue as is, no change in meds.  If you have progressively higher sugars, then let me know.  Otherwise recheck in about 3 months with labs ahead of time.  Take care.  Glad to see you.

## 2016-10-29 NOTE — Progress Notes (Signed)
Diabetes:  Using medications without difficulties:yes Hypoglycemic episodes:no Hyperglycemic episodes: some elevations on home checks, up to 200s fasting Feet problems: some tingling in the feet, at baseline, not worse per patient, possibly some better.   Blood Sugars averaging:see above.   eye exam within last year:yes A1c better at 8.1, d/w pt.  Goal A1c 7-8 at this point, with goal to avoid low sugars.   He cut out potatoes and white bread.    PMH and SH reviewed  Meds, vitals, and allergies reviewed.   ROS: Per HPI unless specifically indicated in ROS section   GEN: nad, alert and oriented HEENT: mucous membranes moist NECK: supple w/o LA CV: rrr. PULM: ctab, no inc wob ABD: soft, +bs EXT: no edema

## 2016-10-29 NOTE — Progress Notes (Signed)
Pre visit review using our clinic review tool, if applicable. No additional management support is needed unless otherwise documented below in the visit note. 

## 2016-10-29 NOTE — Assessment & Plan Note (Signed)
A1c better at 8.1, d/w pt.  Goal A1c 7-8 at this point, with goal to avoid low sugars.  Continue as is.  Update me as needed.  Recheck in about 3 months.  Likely that with continued work on diet A1c will improve. He agrees.

## 2016-11-20 ENCOUNTER — Other Ambulatory Visit: Payer: Self-pay | Admitting: Family Medicine

## 2016-12-11 ENCOUNTER — Other Ambulatory Visit: Payer: Self-pay | Admitting: Family Medicine

## 2016-12-25 ENCOUNTER — Other Ambulatory Visit: Payer: Self-pay | Admitting: Family Medicine

## 2017-01-19 ENCOUNTER — Other Ambulatory Visit: Payer: Self-pay | Admitting: Family Medicine

## 2017-01-19 DIAGNOSIS — E1159 Type 2 diabetes mellitus with other circulatory complications: Secondary | ICD-10-CM

## 2017-01-27 ENCOUNTER — Other Ambulatory Visit (INDEPENDENT_AMBULATORY_CARE_PROVIDER_SITE_OTHER): Payer: Medicare Other

## 2017-01-27 DIAGNOSIS — E1159 Type 2 diabetes mellitus with other circulatory complications: Secondary | ICD-10-CM | POA: Diagnosis not present

## 2017-01-27 LAB — HEMOGLOBIN A1C: Hgb A1c MFr Bld: 8.4 % — ABNORMAL HIGH (ref 4.6–6.5)

## 2017-01-30 ENCOUNTER — Ambulatory Visit: Payer: Medicare Other | Admitting: Family Medicine

## 2017-02-04 ENCOUNTER — Encounter: Payer: Self-pay | Admitting: Family Medicine

## 2017-02-04 ENCOUNTER — Ambulatory Visit (INDEPENDENT_AMBULATORY_CARE_PROVIDER_SITE_OTHER): Payer: Medicare Other | Admitting: Family Medicine

## 2017-02-04 DIAGNOSIS — I119 Hypertensive heart disease without heart failure: Secondary | ICD-10-CM

## 2017-02-04 DIAGNOSIS — E1159 Type 2 diabetes mellitus with other circulatory complications: Secondary | ICD-10-CM

## 2017-02-04 MED ORDER — METFORMIN HCL 1000 MG PO TABS: 1000.0000 mg | ORAL_TABLET | Freq: Two times a day (BID) | ORAL | 3 refills | Status: DC

## 2017-02-04 MED ORDER — TAMSULOSIN HCL 0.4 MG PO CAPS: 0.4000 mg | ORAL_CAPSULE | Freq: Every day | ORAL | 3 refills | Status: DC

## 2017-02-04 MED ORDER — FINASTERIDE 5 MG PO TABS: 5.0000 mg | ORAL_TABLET | Freq: Every day | ORAL | 3 refills | Status: DC

## 2017-02-04 NOTE — Progress Notes (Signed)
Diabetes:  Using medications without difficulties:yes Hypoglycemic episodes:no Hyperglycemic episodes:no Feet problems:some tingling at baseline, not worse.   Blood Sugars averaging: usually 150-200.   eye exam within last year: yes A1c 8.4.  He has been working on diet but his wife is cooking a Production designer, theatre/television/film.  He is still walking as much as he can but that bothers his back.    He doesn't have as much energy as he would like, some of this is expected.  Still with some vertigo sx but he is putting up with that.    Meds, vitals, and allergies reviewed.   ROS: Per HPI unless specifically indicated in ROS section   GEN: nad, alert and oriented HEENT: mucous membranes moist NECK: supple w/o LA CV: rrr. PULM: ctab, no inc wob ABD: soft, +bs EXT: no edema SKIN: no acute rash  Diabetic foot exam: Normal inspection No skin breakdown No calluses  Normal DP pulses Normal sensation to light touch and monofilament Nails thick

## 2017-02-04 NOTE — Patient Instructions (Addendum)
Stop the lisinopril/HCTZ and update me about your energy level and BP in about 1 week.  Keep working on diet and recheck labs in about 3 months before a physical.  Take care.  Glad to see you.

## 2017-02-05 NOTE — Assessment & Plan Note (Signed)
He will work more on diet. No change in meds. Recheck in about 3 months. He agrees.

## 2017-02-05 NOTE — Assessment & Plan Note (Signed)
Unclear to me if his blood pressure is overtreated and if that is contributing to his fatigue. Stop lisinopril hydrochlorothiazide for now and he will update me. He agrees.

## 2017-02-11 ENCOUNTER — Telehealth: Payer: Self-pay | Admitting: Family Medicine

## 2017-02-11 NOTE — Telephone Encounter (Signed)
Placed in Dr. Josefine Class box.

## 2017-02-11 NOTE — Telephone Encounter (Signed)
Pt dropped off bp readings in dr [facility] tower up front

## 2017-02-12 NOTE — Telephone Encounter (Signed)
Patient notified as instructed by telephone and verbalized understanding. Patient stated that he is feeling a little better now.   Patient wants to know if he should start back on his blood pressure medication now?

## 2017-02-12 NOTE — Telephone Encounter (Signed)
BPs are 120s-150s/60-70.  Those are reasonable.  Thanks for the update from patient.  More importantly, how does he feel? Please let me know.   Thanks.

## 2017-02-12 NOTE — Telephone Encounter (Signed)
Left message on answering machine at home number to call back. 

## 2017-02-13 NOTE — Telephone Encounter (Signed)
I wouldn't restart.  If sig elevated BP then update me.  Thanks.

## 2017-02-13 NOTE — Telephone Encounter (Signed)
Patient advised.

## 2017-02-19 ENCOUNTER — Other Ambulatory Visit: Payer: Self-pay | Admitting: Family Medicine

## 2017-02-19 NOTE — Telephone Encounter (Signed)
Last office visit 02/04/2017.  Last Lipid Panel 11/01/2015.  Refill?

## 2017-02-19 NOTE — Telephone Encounter (Signed)
Sent.  We can check lipids with next set of labs, since CPE should be coming up.  Thanks.

## 2017-03-21 DIAGNOSIS — E1142 Type 2 diabetes mellitus with diabetic polyneuropathy: Secondary | ICD-10-CM | POA: Diagnosis not present

## 2017-03-21 DIAGNOSIS — E785 Hyperlipidemia, unspecified: Secondary | ICD-10-CM | POA: Diagnosis not present

## 2017-03-21 DIAGNOSIS — I2510 Atherosclerotic heart disease of native coronary artery without angina pectoris: Secondary | ICD-10-CM | POA: Diagnosis not present

## 2017-03-21 DIAGNOSIS — R42 Dizziness and giddiness: Secondary | ICD-10-CM | POA: Diagnosis not present

## 2017-03-21 DIAGNOSIS — I10 Essential (primary) hypertension: Secondary | ICD-10-CM | POA: Diagnosis not present

## 2017-04-04 DIAGNOSIS — M545 Low back pain: Secondary | ICD-10-CM | POA: Diagnosis not present

## 2017-04-04 DIAGNOSIS — M25551 Pain in right hip: Secondary | ICD-10-CM | POA: Diagnosis not present

## 2017-04-14 DIAGNOSIS — M545 Low back pain: Secondary | ICD-10-CM | POA: Diagnosis not present

## 2017-04-17 ENCOUNTER — Other Ambulatory Visit: Payer: Self-pay | Admitting: Family Medicine

## 2017-04-21 DIAGNOSIS — M545 Low back pain: Secondary | ICD-10-CM | POA: Diagnosis not present

## 2017-05-02 ENCOUNTER — Telehealth: Payer: Self-pay | Admitting: Family Medicine

## 2017-05-02 NOTE — Telephone Encounter (Signed)
Left pt message asking to call Allison back directly at [phone] to schedule AWV + labs with Lesia and CPE with PCP. ° °*NOTE* Last AWV 11/07/15 °

## 2017-05-05 DIAGNOSIS — M545 Low back pain: Secondary | ICD-10-CM | POA: Diagnosis not present

## 2017-05-29 DIAGNOSIS — M545 Low back pain: Secondary | ICD-10-CM | POA: Diagnosis not present

## 2017-05-29 DIAGNOSIS — M5416 Radiculopathy, lumbar region: Secondary | ICD-10-CM | POA: Diagnosis not present

## 2017-06-04 NOTE — Telephone Encounter (Signed)
Left pt message asking to call Ebony Hail back directly at [phone] to schedule AWV + labs with Katha Cabal and CPE with PCP.  *NOTE* Last AWV 11/07/15

## 2017-06-12 DIAGNOSIS — M545 Low back pain: Secondary | ICD-10-CM | POA: Diagnosis not present

## 2017-06-12 DIAGNOSIS — M5416 Radiculopathy, lumbar region: Secondary | ICD-10-CM | POA: Diagnosis not present

## 2017-06-24 ENCOUNTER — Other Ambulatory Visit: Payer: Self-pay | Admitting: Family Medicine

## 2017-06-25 NOTE — Telephone Encounter (Signed)
Received refill request electronically Last office visit 02/04/17 Refill request does not match the medication list which shows 1.5 tablets daily

## 2017-06-25 NOTE — Telephone Encounter (Signed)
Left message to call office

## 2017-06-25 NOTE — Telephone Encounter (Signed)
Verify dose and let me know.  I thought he was on 1.5 tabs a day. Please schedule OV, due.  Thanks.

## 2017-06-27 NOTE — Telephone Encounter (Signed)
Patient called the office but says his wife puts out his medicine and he's not sure about the dosage but thinks it is 1.5 tabs per day.  Patient says he will have his wife call when she gets from work.

## 2017-07-01 DIAGNOSIS — M545 Low back pain: Secondary | ICD-10-CM | POA: Diagnosis not present

## 2017-07-01 DIAGNOSIS — M47817 Spondylosis without myelopathy or radiculopathy, lumbosacral region: Secondary | ICD-10-CM | POA: Diagnosis not present

## 2017-07-01 DIAGNOSIS — M5416 Radiculopathy, lumbar region: Secondary | ICD-10-CM | POA: Diagnosis not present

## 2017-08-05 ENCOUNTER — Ambulatory Visit (INDEPENDENT_AMBULATORY_CARE_PROVIDER_SITE_OTHER): Payer: Medicare Other

## 2017-08-05 VITALS — BP 152/74 | HR 65 | Temp 98.5°F | Ht 66.0 in | Wt 169.5 lb

## 2017-08-05 DIAGNOSIS — E1159 Type 2 diabetes mellitus with other circulatory complications: Secondary | ICD-10-CM

## 2017-08-05 DIAGNOSIS — E7849 Other hyperlipidemia: Secondary | ICD-10-CM

## 2017-08-05 DIAGNOSIS — Z0000 Encounter for general adult medical examination without abnormal findings: Secondary | ICD-10-CM

## 2017-08-05 LAB — LIPID PANEL
Cholesterol: 142 mg/dL (ref 0–200)
HDL: 41.5 mg/dL (ref >39.00)
LDL Cholesterol: 80 mg/dL (ref 0–99)
NonHDL: 100.33
Total CHOL/HDL Ratio: 3
Triglycerides: 102 mg/dL (ref 0.0–149.0)
VLDL: 20.4 mg/dL (ref 0.0–40.0)

## 2017-08-05 LAB — COMPREHENSIVE METABOLIC PANEL
ALT: 18 U/L (ref 0–53)
AST: 20 U/L (ref 0–37)
Albumin: 4.2 g/dL (ref 3.5–5.2)
Alkaline Phosphatase: 53 U/L (ref 39–117)
BUN: 13 mg/dL (ref 6–23)
CO2: 26 mEq/L (ref 19–32)
Calcium: 9.6 mg/dL (ref 8.4–10.5)
Chloride: 103 mEq/L (ref 96–112)
Creatinine, Ser: 1 mg/dL (ref 0.40–1.50)
GFR: 75.6 mL/min (ref >60.00)
Glucose, Bld: 187 mg/dL — ABNORMAL HIGH (ref 70–99)
Potassium: 5 mEq/L (ref 3.5–5.1)
Sodium: 139 mEq/L (ref 135–145)
Total Bilirubin: 0.5 mg/dL (ref 0.2–1.2)
Total Protein: 6.9 g/dL (ref 6.0–8.3)

## 2017-08-05 LAB — MICROALBUMIN / CREATININE URINE RATIO
Creatinine,U: 121.5 mg/dL
Microalb Creat Ratio: 5.1 mg/g (ref 0.0–30.0)
Microalb, Ur: 6.2 mg/dL — ABNORMAL HIGH (ref 0.0–1.9)

## 2017-08-05 LAB — HEMOGLOBIN A1C: Hgb A1c MFr Bld: 7.8 % — ABNORMAL HIGH (ref 4.6–6.5)

## 2017-08-05 NOTE — Progress Notes (Signed)
Subjective:   Paul Terrell is a 83 y.o. male who presents for Medicare Annual (Subsequent) preventive examination.  Review of Systems:  N/A Cardiac Risk Factors include: advanced age (>22men, >19 women);male gender;diabetes mellitus;dyslipidemia;hypertension     Objective:     Vitals: BP (!) 152/74 (BP Location: Right Arm, Patient Position: Sitting, Cuff Size: Normal)   Pulse 65   Temp 98.5 F (36.9 C) (Oral)   Ht 5\' 6"  (1.676 m) Comment: no shoes  Wt 169 lb 8 oz (76.9 kg)   SpO2 98%   BMI 27.36 kg/m   Body mass index is 27.36 kg/m.   Tobacco Social History   Tobacco Use  Smoking Status Former Smoker  . Packs/day: 1.00  . Years: 25.00  . Pack years: 25.00  . Types: Cigarettes  . Last attempt to quit: 01/09/1976  . Years since quitting: 41.6  Smokeless Tobacco Current User  . Types: Chew  Tobacco Comment   quit smoking about 25 years ago     Ready to quit: No Counseling given: No Comment: quit smoking about 25 years ago   Past Medical History:  Diagnosis Date  . Arthritis   . Basal cell carcinoma of scalp 12/16/07   Reexcision (Dr. Merril Abbe. Teressa Senter)  . BPH (benign prostatic hyperplasia)   . CAD (coronary artery disease) 11/01   stent placed  . Cataract   . Diabetes mellitus type II 11/01  . GERD (gastroesophageal reflux disease)    occasionally  . Hearing aid worn   . Hyperlipemia 11/01  . Hypertension 11/01  . Ruptured disc, cervical    Neck C7 (Dr. Pearlie Oyster)   Past Surgical History:  Procedure Laterality Date  . BASAL CELL CARCINOMA EXCISION    . BUNIONECTOMY  02/2000   Dr. Janus Molder - Right  . CERVICAL FUSION    . COLONOSCOPY     2012  . KNEE ARTHROSCOPY  09/1999   Right  . LACERATION REPAIR  09/1999   Right Hand (Dr. Fredna Dow)  . LITHOTRIPSY  1991   Dr. Elyse Jarvis  . ROTATOR CUFF REPAIR  02/05   Left   Family History  Problem Relation Age of Onset  . Stroke Mother        Lived 2 years  . Heart disease Mother        CAD,  Angioplasty X 2  . Hypertension Mother   . Heart disease Father        MI  . Hypertension Father   . Hypertension Sister   . Hypertension Brother   . Heart disease Brother        MI  . Hyperlipidemia Brother   . Heart disease Brother        MI   Social History   Substance and Sexual Activity  Sexual Activity No    Outpatient Encounter Medications as of 08/05/2017  Medication Sig  . aspirin 81 MG tablet Take 81 mg by mouth daily.    . Cholecalciferol (VITAMIN D-3) 5000 UNITS TABS Take by mouth daily.  . finasteride (PROSCAR) 5 MG tablet Take 1 tablet (5 mg total) by mouth daily.  Marland Kitchen glyBURIDE (DIABETA) 5 MG tablet Take 1.5 (7.5 mg total) every morning.  . Lancets (ONETOUCH ULTRASOFT) lancets Test once daily Dx 250.00   . metFORMIN (GLUCOPHAGE) 1000 MG tablet Take 1 tablet (1,000 mg total) by mouth 2 (two) times daily.  . metoprolol succinate (TOPROL-XL) 50 MG 24 hr tablet TAKE 1 TABLET EVERY DAY  WITH  OR  FOLLOWING  A  MEAL  . Multiple Vitamin (MULTIVITAMIN) tablet Take 1 tablet by mouth daily.    . Omega-3 Fatty Acids (FISH OIL) 1000 MG CAPS Take by mouth daily.    . ONE TOUCH ULTRA TEST test strip TEST BLOOD SUGAR ONCE DAILY. DX: 250.00  . pravastatin (PRAVACHOL) 20 MG tablet TAKE 1 TABLET EVERY DAY  . tamsulosin (FLOMAX) 0.4 MG CAPS capsule Take 1 capsule (0.4 mg total) by mouth daily.  . vitamin B-12 (CYANOCOBALAMIN) 250 MCG tablet Take 250 mcg by mouth 2 (two) times daily.   . vitamin E 400 UNIT capsule Take 400 Units by mouth daily.   No facility-administered encounter medications on file as of 08/05/2017.     Activities of Daily Living In your present state of health, do you have any difficulty performing the following activities: 08/05/2017  Hearing? Y  Vision? Y  Difficulty concentrating or making decisions? Y  Walking or climbing stairs? N  Dressing or bathing? N  Doing errands, shopping? N  Preparing Food and eating ? N  Using the Toilet? N  In the past six  months, have you accidently leaked urine? N  Do you have problems with loss of bowel control? N  Managing your Medications? N  Managing your Finances? N  Housekeeping or managing your Housekeeping? N  Some recent data might be hidden    Patient Care Team: Tonia Ghent, MD as PCP - General (Family Medicine) Jacolyn Reedy, MD as Consulting Physician (Cardiology) Luberta Mutter, MD as Consulting Physician (Ophthalmology) Druscilla Brownie, MD as Consulting Physician (Dermatology)    Assessment:    Hearing Screening Comments: Bilateral hearing aids Vision Screening Comments: Last vision exam in Jan 2018 with Dr Ellie Lunch;  Exercise Activities and Dietary recommendations Current Exercise Habits: Home exercise routine, Type of exercise: Other - see comments(chair exercises), Time (Minutes): 10, Frequency (Times/Week): 4, Weekly Exercise (Minutes/Week): 40, Intensity: Mild, Exercise limited by: orthopedic condition(s)  Goals    Starting 08/05/2017, I will continue to take medications as prescribed.      Fall Risk Fall Risk  08/05/2017 11/07/2015 11/03/2014 11/01/2013  Falls in the past year? Yes No No No  Number falls in past yr: 2 or more - - -  Injury with Fall? Yes - - -  Risk for fall due to : Impaired balance/gait;Medication side effect - - -   Depression Screen PHQ 2/9 Scores 08/05/2017 11/07/2015 11/03/2014 11/01/2013  PHQ - 2 Score 1 0 0 0  PHQ- 9 Score 1 - - -     Cognitive Function MMSE - Mini Mental State Exam 08/05/2017  Orientation to time 5  Orientation to Place 5  Registration 3  Attention/ Calculation 0  Recall 3  Language- name 2 objects 0  Language- repeat 1  Language- follow 3 step command 3  Language- read & follow direction 0  Write a sentence 0  Copy design 0  Total score 20     PLEASE NOTE: A Mini-Cog screen was completed. Maximum score is 20. A value of 0 denotes this part of Folstein MMSE was not completed or the patient failed this part of the  Mini-Cog screening.   Mini-Cog Screening Orientation to Time - Max 5 pts Orientation to Place - Max 5 pts Registration - Max 3 pts Recall - Max 3 pts Language Repeat - Max 1 pts Language Follow 3 Step Command - Max 3 pts     Immunization History  Administered Date(s) Administered  . Influenza  Whole 07/02/2006, 07/31/2007, 06/28/2008, 06/22/2009  . Influenza,inj,Quad PF,6+ Mos 06/09/2015, 07/04/2016  . Influenza-Unspecified 07/19/2014, 07/07/2017  . Pneumococcal Conjugate-13 06/09/2015  . Pneumococcal Polysaccharide-23 08/13/1999  . Td 08/02/2004  . Tdap 11/22/2014   Screening Tests Health Maintenance  Topic Date Due  . OPHTHALMOLOGY EXAM  10/15/2017  . HEMOGLOBIN A1C  02/02/2018  . FOOT EXAM  02/04/2018  . TETANUS/TDAP  11/21/2024  . INFLUENZA VACCINE  Completed  . PNA vac Low Risk Adult  Completed      Plan:     I have personally reviewed, addressed, and noted the following in the patient's chart:  A. Medical and social history B. Use of alcohol, tobacco or illicit drugs  C. Current medications and supplements D. Functional ability and status E.  Nutritional status F.  Physical activity G. Advance directives H. List of other physicians I.  Hospitalizations, surgeries, and ER visits in previous 12 months J.  [place] to include hearing, vision, cognitive, depression L. Referrals and appointments - none  In addition, I have reviewed and discussed with patient certain preventive protocols, quality metrics, and best practice recommendations. A written personalized care plan for preventive services as well as general preventive health recommendations were provided to patient.  See attached scanned questionnaire for additional information.   Signed,   Lindell Noe, MHA, BS, LPN Health Coach

## 2017-08-05 NOTE — Patient Instructions (Signed)
Paul Terrell , Thank you for taking time to come for your Medicare Wellness Visit. I appreciate your ongoing commitment to your health goals. Please review the following plan we discussed and let me know if I can assist you in the future.   These are the goals we discussed: Goals    Starting 08/05/2017, I will continue to take medications as prescribed.       This is a list of the screening recommended for you and due dates:  Health Maintenance  Topic Date Due  . Eye exam for diabetics  10/15/2017  . Hemoglobin A1C  02/02/2018  . Complete foot exam   02/04/2018  . Tetanus Vaccine  11/21/2024  . Flu Shot  Completed  . Pneumonia vaccines  Completed   Preventive Care for Adults  A healthy lifestyle and preventive care can promote health and wellness. Preventive health guidelines for adults include the following key practices.  . A routine yearly physical is a good way to check with your health care provider about your health and preventive screening. It is a chance to share any concerns and updates on your health and to receive a thorough exam.  . Visit your dentist for a routine exam and preventive care every 6 months. Brush your teeth twice a day and floss once a day. Good oral hygiene prevents tooth decay and gum disease.  . The frequency of eye exams is based on your age, health, family medical history, use  of contact lenses, and other factors. Follow your health care provider's recommendations for frequency of eye exams.  . Eat a healthy diet. Foods like vegetables, fruits, whole grains, low-fat dairy products, and lean protein foods contain the nutrients you need without too many calories. Decrease your intake of foods high in solid fats, added sugars, and salt. Eat the right amount of calories for you. Get information about a proper diet from your health care provider, if necessary.  . Regular physical exercise is one of the most important things you can do for your health. Most  adults should get at least 150 minutes of moderate-intensity exercise (any activity that increases your heart rate and causes you to sweat) each week. In addition, most adults need muscle-strengthening exercises on 2 or more days a week.  Silver Sneakers may be a benefit available to you. To determine eligibility, you may visit the website: www.silversneakers.com or contact program at 854-350-6266 Mon-Fri between 8AM-8PM.   . Maintain a healthy weight. The body mass index (BMI) is a screening tool to identify possible weight problems. It provides an estimate of body fat based on height and weight. Your health care provider can find your BMI and can help you achieve or maintain a healthy weight.   For adults 20 years and older: ? A BMI below 18.5 is considered underweight. ? A BMI of 18.5 to 24.9 is normal. ? A BMI of 25 to 29.9 is considered overweight. ? A BMI of 30 and above is considered obese.   . Maintain normal blood lipids and cholesterol levels by exercising and minimizing your intake of saturated fat. Eat a balanced diet with plenty of fruit and vegetables. Blood tests for lipids and cholesterol should begin at age 56 and be repeated every 5 years. If your lipid or cholesterol levels are high, you are over 50, or you are at high risk for heart disease, you may need your cholesterol levels checked more frequently. Ongoing high lipid and cholesterol levels should be treated with  medicines if diet and exercise are not working.  . If you smoke, find out from your health care provider how to quit. If you do not use tobacco, please do not start.  . If you choose to drink alcohol, please do not consume more than 2 drinks per day. One drink is considered to be 12 ounces (355 mL) of beer, 5 ounces (148 mL) of wine, or 1.5 ounces (44 mL) of liquor.  . If you are 41-14 years old, ask your health care provider if you should take aspirin to prevent strokes.  . Use sunscreen. Apply sunscreen  liberally and repeatedly throughout the day. You should seek shade when your shadow is shorter than you. Protect yourself by wearing long sleeves, pants, a wide-brimmed hat, and sunglasses year round, whenever you are outdoors.  . Once a month, do a whole body skin exam, using a mirror to look at the skin on your back. Tell your health care provider of new moles, moles that have irregular borders, moles that are larger than a pencil eraser, or moles that have changed in shape or color.

## 2017-08-05 NOTE — Progress Notes (Signed)
PCP notes:   Health maintenance:  A1C - completed  Abnormal screenings:   Fall risk - hx of multiple falls related to medication  Depression score: 1  Patient concerns:   Patient would like to advise PCP he can no longer stand to urinate because he cannot control the flow and direction of urine.  Nurse concerns:  None  Next PCP appt:   08/12/17 @ 1045  I reviewed health advisor's note, was available for consultation on the day of service listed in this note, and agree with documentation and plan. Elsie Stain, MD.

## 2017-08-05 NOTE — Progress Notes (Signed)
Pre visit review using our clinic review tool, if applicable. No additional management support is needed unless otherwise documented below in the visit note. 

## 2017-08-08 DIAGNOSIS — M47817 Spondylosis without myelopathy or radiculopathy, lumbosacral region: Secondary | ICD-10-CM | POA: Diagnosis not present

## 2017-08-08 DIAGNOSIS — M545 Low back pain: Secondary | ICD-10-CM | POA: Diagnosis not present

## 2017-08-08 DIAGNOSIS — M5416 Radiculopathy, lumbar region: Secondary | ICD-10-CM | POA: Diagnosis not present

## 2017-08-12 ENCOUNTER — Ambulatory Visit (INDEPENDENT_AMBULATORY_CARE_PROVIDER_SITE_OTHER): Payer: Medicare Other | Admitting: Family Medicine

## 2017-08-12 ENCOUNTER — Encounter: Payer: Self-pay | Admitting: Family Medicine

## 2017-08-12 VITALS — BP 152/74 | HR 65 | Temp 98.5°F | Ht 66.0 in | Wt 169.5 lb

## 2017-08-12 DIAGNOSIS — E1129 Type 2 diabetes mellitus with other diabetic kidney complication: Secondary | ICD-10-CM | POA: Diagnosis not present

## 2017-08-12 DIAGNOSIS — E785 Hyperlipidemia, unspecified: Secondary | ICD-10-CM

## 2017-08-12 DIAGNOSIS — E1159 Type 2 diabetes mellitus with other circulatory complications: Secondary | ICD-10-CM | POA: Diagnosis not present

## 2017-08-12 DIAGNOSIS — R809 Proteinuria, unspecified: Secondary | ICD-10-CM | POA: Diagnosis not present

## 2017-08-12 DIAGNOSIS — N400 Enlarged prostate without lower urinary tract symptoms: Secondary | ICD-10-CM

## 2017-08-12 MED ORDER — METOPROLOL SUCCINATE ER 50 MG PO TB24: ORAL_TABLET | ORAL | 1 refills | Status: DC

## 2017-08-12 NOTE — Patient Instructions (Signed)
Don't change your meds for now.  Recheck labs prior to a visit in about 6 months.  Blood and urine labs.  You don't have to fast.  Take care.  Update me as needed.  Glad to see you.  Happy thanksgiving.

## 2017-08-12 NOTE — Progress Notes (Signed)
Advance directive- wife designated if patient were incapacitated.   Fall risk - hx of multiple falls related to low BP/medication.  Fall cautions d/w pt.  His BP was lower and that likely caused the fall.  He was diffusely sore after the fall, about 2 days later.   He has seen ortho in the meantime and has been getting repeated injections with sig relief of back pain.    His vertigo sx are better.  No CP, SOB.  Some occ BLE edema.    He can no longer stand to urinate because he cannot control the flow and direction of urine due to redundant foreskin.  On flomax and finasteride at baseline.  His stream is still good o/w.  He can tolerate as is and didn't need uro referral. He can still retract his foreskin.   Diabetes:  Using medications without difficulties:yes Hypoglycemic episodes: usually sig higher sugar with injections but then returns to baseline ~150 quickly.    Hyperglycemic episodes:no Feet problems:R 1st toe pain but no tingling   Blood Sugars averaging: usually 145 or higher eye exam within last year:yes A1c d/w pt.   MALB positive.   D/w pt.   Elevated Cholesterol: Using medications without problems:yes Muscle aches: likely not from statin Diet compliance: yes Exercise: as tolerated.   PMH and SH reviewed  Meds, vitals, and allergies reviewed.   ROS: Per HPI unless specifically indicated in ROS section   GEN: nad, alert and oriented HEENT: mucous membranes moist NECK: supple w/o LA CV: rrr. PULM: ctab, no inc wob ABD: soft, +bs EXT: no edema SKIN: no acute rash  Diabetic foot exam: Normal inspection No skin breakdown No calluses  Normal DP pulses Normal sensation to light touch and monofilament Nails normal except for L 1st nail loose after prev fall- is growing back normally.

## 2017-08-14 NOTE — Assessment & Plan Note (Signed)
Labs d/w pt.  Continue statin.  He agrees.

## 2017-08-14 NOTE — Assessment & Plan Note (Signed)
He can no longer stand to urinate because he cannot control the flow and direction of urine due to redundant foreskin.  On flomax and finasteride at baseline.  His stream is still good o/w.  He can tolerate as is and didn't need uro referral. He can still retract his foreskin.

## 2017-08-14 NOTE — Assessment & Plan Note (Signed)
I don't want to induce hypoglycemia or hypotension.  Will recheck A1c and MALB periodically, will hold off on adding ACE for now.  D/w pt. He agrees.  >25 minutes spent in face to face time with patient, >50% spent in counselling or coordination of care. See AVS.

## 2017-08-30 DIAGNOSIS — Z23 Encounter for immunization: Secondary | ICD-10-CM | POA: Diagnosis not present

## 2017-09-29 DIAGNOSIS — D1801 Hemangioma of skin and subcutaneous tissue: Secondary | ICD-10-CM | POA: Diagnosis not present

## 2017-09-29 DIAGNOSIS — D225 Melanocytic nevi of trunk: Secondary | ICD-10-CM | POA: Diagnosis not present

## 2017-09-29 DIAGNOSIS — Z85828 Personal history of other malignant neoplasm of skin: Secondary | ICD-10-CM | POA: Diagnosis not present

## 2017-09-29 DIAGNOSIS — L821 Other seborrheic keratosis: Secondary | ICD-10-CM | POA: Diagnosis not present

## 2017-09-29 DIAGNOSIS — L853 Xerosis cutis: Secondary | ICD-10-CM | POA: Diagnosis not present

## 2017-09-29 DIAGNOSIS — L814 Other melanin hyperpigmentation: Secondary | ICD-10-CM | POA: Diagnosis not present

## 2017-10-03 DIAGNOSIS — E785 Hyperlipidemia, unspecified: Secondary | ICD-10-CM | POA: Diagnosis not present

## 2017-10-03 DIAGNOSIS — I2510 Atherosclerotic heart disease of native coronary artery without angina pectoris: Secondary | ICD-10-CM | POA: Diagnosis not present

## 2017-10-03 DIAGNOSIS — E7849 Other hyperlipidemia: Secondary | ICD-10-CM | POA: Diagnosis not present

## 2017-10-03 DIAGNOSIS — I10 Essential (primary) hypertension: Secondary | ICD-10-CM | POA: Diagnosis not present

## 2017-10-03 DIAGNOSIS — E1142 Type 2 diabetes mellitus with diabetic polyneuropathy: Secondary | ICD-10-CM | POA: Diagnosis not present

## 2017-10-03 DIAGNOSIS — R42 Dizziness and giddiness: Secondary | ICD-10-CM | POA: Diagnosis not present

## 2017-10-13 ENCOUNTER — Other Ambulatory Visit: Payer: Self-pay | Admitting: Family Medicine

## 2017-10-13 DIAGNOSIS — R2681 Unsteadiness on feet: Secondary | ICD-10-CM

## 2017-10-13 NOTE — Progress Notes (Unsigned)
I signed off on the order.  He'll have to check with insurance about coverage.  Thanks.

## 2017-10-13 NOTE — Progress Notes (Signed)
Patient agrees with PT as long as his insurance will cover it.

## 2017-10-13 NOTE — Progress Notes (Unsigned)
Cards recs PT due to falls/unsteady gait.  I have the order ready to go, assuming patient consents.  Check with patient, let me know and I can sign off.  Thanks.

## 2017-10-20 ENCOUNTER — Telehealth: Payer: Self-pay

## 2017-10-20 NOTE — Telephone Encounter (Signed)
Copied from [place] [phone]. Topic: General - Other >> Oct 20, 2017 10:36 AM Yvette Rack wrote: Reason for CRM: patient wife calling stating that someone had called her husband last week and gave him a name of a Doctor and a number her husband doesn't hear well so he forgot the name and number for a therapist that was a referral

## 2017-10-23 ENCOUNTER — Ambulatory Visit: Payer: Medicare Other | Admitting: Physical Therapy

## 2017-10-27 ENCOUNTER — Other Ambulatory Visit: Payer: Self-pay

## 2017-10-27 ENCOUNTER — Encounter: Payer: Self-pay | Admitting: Physical Therapy

## 2017-10-27 ENCOUNTER — Ambulatory Visit: Payer: Medicare Other | Attending: Family Medicine | Admitting: Physical Therapy

## 2017-10-27 VITALS — BP 154/64 | HR 75

## 2017-10-27 DIAGNOSIS — R2681 Unsteadiness on feet: Secondary | ICD-10-CM | POA: Diagnosis not present

## 2017-10-27 DIAGNOSIS — R2689 Other abnormalities of gait and mobility: Secondary | ICD-10-CM | POA: Insufficient documentation

## 2017-10-27 DIAGNOSIS — M21371 Foot drop, right foot: Secondary | ICD-10-CM | POA: Diagnosis not present

## 2017-10-27 DIAGNOSIS — M6281 Muscle weakness (generalized): Secondary | ICD-10-CM | POA: Diagnosis not present

## 2017-10-27 DIAGNOSIS — R29818 Other symptoms and signs involving the nervous system: Secondary | ICD-10-CM | POA: Diagnosis not present

## 2017-10-28 NOTE — Therapy (Signed)
[facility] [address] [place], [place], [address] Phone: [phone]   Fax:  [phone]  Physical Therapy Evaluation  Patient Details  Name: Paul Terrell MRN: [id number] Date of Birth: [birth year] Referring Provider: Tonia Ghent, MD   Encounter Date: 10/27/2017  PT End of Session - 10/28/17 0522    Visit Number  1    Number of Visits  17    Date for PT Re-Evaluation  12/26/17    Authorization Type  MCR    Authorization Time Period  10/27/17 to 12/26/17    PT Start Time  1016    PT Stop Time  1104    PT Time Calculation (min)  48 min    Equipment Utilized During Treatment  Gait belt    Activity Tolerance  Patient tolerated treatment well    Behavior During Therapy  [facility] for tasks assessed/performed       Past Medical History:  Diagnosis Date  . Arthritis   . Basal cell carcinoma of scalp 12/16/07   Reexcision (Dr. Merril Abbe. Teressa Senter)  . BPH (benign prostatic hyperplasia)   . CAD (coronary artery disease) 11/01   stent placed  . Cataract   . Diabetes mellitus type II 11/01  . GERD (gastroesophageal reflux disease)    occasionally  . Hearing aid worn   . Hyperlipemia 11/01  . Hypertension 11/01  . Ruptured disc, cervical    Neck C7 (Dr. Pearlie Oyster)    Past Surgical History:  Procedure Laterality Date  . BASAL CELL CARCINOMA EXCISION    . BUNIONECTOMY  02/2000   Dr. Janus Molder - Right  . CERVICAL FUSION    . COLONOSCOPY     2012  . KNEE ARTHROSCOPY  09/1999   Right  . LACERATION REPAIR  09/1999   Right Hand (Dr. Fredna Dow)  . LITHOTRIPSY  1991   Dr. Elyse Jarvis  . ROTATOR CUFF REPAIR  02/05   Left    Vitals:   10/27/17 1026 10/27/17 1033 10/27/17 1036  BP: (!) 162/73 (!) 153/66 (!) 154/64  Pulse: 71 83 75                             Sitting   Standing   Standing after 3 minutes   Subjective Assessment - 10/27/17 1026    Subjective  My doctor's nurse called about 2 weeks ago and said I should  come to therapy. I didn't ask why. (However, agreed he had multiple falls and decreased balance)    Pertinent History  OA, CAD, DM, Hearing aid, HTN, cervical fusion    Patient Stated Goals  reduce falls     Currently in Pain?  Yes    Pain Score  1  gets worse at night    Pain Location  Leg    Pain Orientation  Right;Left    Pain Descriptors / Indicators  Burning;Sharp    Pain Type  Chronic pain    Pain Onset  More than a month ago    Pain Frequency  Constant    Aggravating Factors   lying supine    Pain Relieving Factors  sitting up         [facility] PT Assessment - 10/27/17 1038      Assessment   Medical Diagnosis  unsteady gait    Referring Provider  Tonia Ghent, MD    Onset Date/Surgical Date  -- none known "I've been falling for  awhile"    Prior Therapy  not for balance      Precautions   Precautions  Fall      Restrictions   Weight Bearing Restrictions  No      Balance Screen   Has the patient fallen in the past 6 months  Yes    How many times?  8 most recently 3 weeks ago    Has the patient had a decrease in activity level because of a fear of falling?   Yes    Is the patient reluctant to leave their home because of a fear of falling?   No      Home Environment   Living Environment  Private residence    Living Arrangements  Spouse/significant other    Available Help at Discharge  Family;Available 24 hours/day    Type of Home  House    Home Access  Stairs to enter    Entrance Stairs-Number of Steps  3    Entrance Stairs-Rails  None    Home Layout  Laundry or work area in basement doesn't go down there anymore     Actuary - 2 wheels;Cane - single point    Additional Comments  Avoids going down basement stairs      Prior Function   Level of Independence  Independent with community mobility with device;Independent with household mobility without device insude uses furniture/doorframes    Vocation  Retired      Charity fundraiser  Status  Within Functional Limits for tasks assessed      Observation/Other Assessments   Observations  staggering with cane; wide BOS    Focus on Therapeutic Outcomes (FOTO)   NA      Sensation   Light Touch  Impaired by gross assessment bil neuropathy      Coordination   Gross Motor Movements are Fluid and Coordinated  Yes    Fine Motor Movements are Fluid and Coordinated  No limited by weakness      Posture/Postural Control   Posture/Postural Control  Postural limitations    Postural Limitations  Rounded Shoulders;Forward head    Posture Comments  able to reverse rounded shoulders witht vc      ROM / Strength   AROM / PROM / Strength  AROM;Strength      AROM   Overall AROM   Deficits    Overall AROM Comments  rt ankle DF to neutral (PROM also only to neutral)      Strength   Overall Strength  Deficits    Overall Strength Comments  rt ankle DF 2+, lt 3+; see 5 times sit to stand      Transfers   Transfers  Sit to Stand    Sit to Stand  6: Modified independent (Device/Increase time);With upper extremity assist incr time and effort    Five time sit to stand comments   19.28      Ambulation/Gait   Ambulation/Gait  Yes    Ambulation/Gait Assistance  6: Modified independent (Device/Increase time)    Ambulation Distance (Feet)  60 Feet 75, 40, 100    Assistive device  Straight cane    Gait Pattern  Step-through pattern;Decreased step length - right;Decreased step length - left;Decreased dorsiflexion - right;Right foot flat;Left foot flat;Decreased trunk rotation;Trunk flexed;Wide base of support;Poor foot clearance - left;Poor foot clearance - right    Ambulation Surface  Level;Indoor    Gait velocity  32.8/23.19=1.41 ft/sec    Stairs  Yes    Stairs Assistance  6: Modified independent (Device/Increase time)    Stair Management Technique  One rail Right;Alternating pattern;Forwards;With cane    Number of Stairs  4    Height of Stairs  6      Standardized Balance Assessment    Standardized Balance Assessment  Timed Up and Go Test;Dynamic Gait Index      Dynamic Gait Index   Level Surface  Moderate Impairment    Change in Gait Speed  Moderate Impairment    Gait with Horizontal Head Turns  Moderate Impairment    Gait with Vertical Head Turns  Moderate Impairment    Gait and Pivot Turn  Mild Impairment    Step Over Obstacle  Moderate Impairment    Step Around Obstacles  Mild Impairment    Steps  Mild Impairment    Total Score  11      Timed Up and Go Test   Normal TUG (seconds)  20.35             Objective measurements completed on examination: See above findings.              PT Education - 10/28/17 0521    Education provided  Yes    Education Details  role of PT; results of assessment; goals for PT and POC    Person(s) Educated  Patient    Methods  Explanation    Comprehension  Verbalized understanding       PT Short Term Goals - 10/28/17 0640      PT SHORT TERM GOAL #1   Title  Patient will be independent with HEP addressing deficits related to decreased balance. (Target for all STGs 11/26/17)    Time  4    Period  Weeks    Status  New    Target Date  11/26/17      PT SHORT TERM GOAL #2   Title  Patient will improve gait velocity to >= 1.60 ft/sec to demonstrate lesser fall risk.    Baseline  2/4 1.41 ft/sec    Time  4    Period  Weeks    Status  New      PT SHORT TERM GOAL #3   Title  Pt will decrease TUG to <=17 sec (with LRAD) as making progress towards lesser fall risk.     Baseline  TUG 20.35    Time  4    Period  Weeks    Status  New      PT SHORT TERM GOAL #4   Title  Patient will improve DGI to >= 15/28 demonstrating progress towards lesser fall risk.     Baseline  DGI 11/28    Time  4    Period  Weeks    Status  New        PT Long Term Goals - 10/28/17 2778      PT LONG TERM GOAL #1   Title  Patient will be independent with updated HEP to improve balance and strength. (Target for all LTGs 12/26/17)     Time  8    Period  Weeks    Status  New    Target Date  12/26/17      PT LONG TERM GOAL #2   Title  Patient will improve 5X sit to stand to <= 16.7 sec (norm for age) to demonstrate improved LE strength and balance.    Time  8    Period  Weeks  Status  New      PT LONG TERM GOAL #3   Title  Patient will improve gait velocity to >=1.81 ft/sec to indicate lesser fall risk.    Time  8    Period  Weeks    Status  New      PT LONG TERM GOAL #4   Title  Patient will improve TUG to <=15 sec (with LRAD) to demonstrate progress towards lesser fall risk.    Time  8    Period  Weeks    Status  New      PT LONG TERM GOAL #5   Title  Patient will demonstrate floor to chair transfer modified independence to allow him to get up off the floor in the event of a fall.     Time  8    Period  Weeks    Status  New             Plan - 10/28/17 2440    Clinical Impression Statement  Patient referred to OPPT due to multiple falls for fall risk reduction and balance training. Per MD notes, some falls related to low BP as a result of BP medications. (OF note, pt did have a drop in BP with sit to stand and remain standing x 3 minutes, however not enough to classify as orthostatic hypotension (see vitals). Patient was asymptomatic during testing). Patient does display abnormal gait, decreased strength, limited ankle ROM, and decreased sensation in LEs which all contribute to his increased fall risk. Patient can benefit from the PT interventions listed below to address the deficiits listed below.     History and Personal Factors relevant to plan of care:  PMH-OA, CAD, DM, Hearing aid, HTN, cervical fusion; Personal factors-age,     Clinical Presentation  Evolving    Clinical Presentation due to:  BP issues with MD continuing to make adjustment to BP medicine    Clinical Decision Making  Low    Rehab Potential  Good    Clinical Impairments Affecting Rehab Potential  BP issues; decreased sensation  bil LEs    PT Frequency  2x / week    PT Duration  8 weeks    PT Treatment/Interventions  ADLs/Self Care Home Management;Electrical Stimulation;DME Instruction;Gait training;Neuromuscular re-education;Balance training;Therapeutic exercise;Therapeutic activities;Functional mobility training;Patient/family education;Orthotic Fit/Training;Manual techniques;Passive range of motion    PT Next Visit Plan  monitor BP for orthostasis; initiate HEP for balance, LE strengthening, ankle ROM    Consulted and Agree with Plan of Care  Patient       Patient will benefit from skilled therapeutic intervention in order to improve the following deficits and impairments:  Abnormal gait, Cardiopulmonary status limiting activity, Decreased activity tolerance, Decreased balance, Decreased mobility, Decreased knowledge of use of DME, Decreased coordination, Decreased range of motion, Decreased strength, Impaired flexibility, Impaired sensation, Postural dysfunction, Pain  Visit Diagnosis: Unsteadiness on feet - Plan: PT plan of care cert/re-cert  Other abnormalities of gait and mobility - Plan: PT plan of care cert/re-cert  Muscle weakness (generalized) - Plan: PT plan of care cert/re-cert  Foot drop, right - Plan: PT plan of care cert/re-cert  Other symptoms and signs involving the nervous system - Plan: PT plan of care cert/re-cert     Problem List Patient Active Problem List   Diagnosis Date Noted  . Spinal stenosis of lumbar region 11/08/2015  . Advance care planning 11/04/2014  . Leg pain 01/30/2014  . Medicare annual wellness visit, subsequent 11/01/2013  .  BPH (benign prostatic hyperplasia)   . Type 2 diabetes mellitus with vascular disease ([place])   . Hyperlipidemia   . Hypertensive heart disease   . CAD (coronary artery disease)     Rexanne Mano, PT 10/28/2017, 6:58 AM  [facility] [address] [place], [place], [address] Phone:  [phone]   Fax:  [phone]  Name: AVIR DERUITER MRN: [id number] Date of Birth: 1930/12/11

## 2017-10-28 NOTE — Progress Notes (Signed)
Agree.  Thanks.  Paul Terrell 10/28/17 7:29 AM

## 2017-11-03 ENCOUNTER — Encounter: Payer: Self-pay | Admitting: Physical Therapy

## 2017-11-03 ENCOUNTER — Telehealth: Payer: Self-pay | Admitting: Physical Therapy

## 2017-11-03 ENCOUNTER — Ambulatory Visit: Payer: Medicare Other | Admitting: Physical Therapy

## 2017-11-03 VITALS — BP 182/78

## 2017-11-03 DIAGNOSIS — R2689 Other abnormalities of gait and mobility: Secondary | ICD-10-CM | POA: Diagnosis not present

## 2017-11-03 DIAGNOSIS — M6281 Muscle weakness (generalized): Secondary | ICD-10-CM | POA: Diagnosis not present

## 2017-11-03 DIAGNOSIS — R2681 Unsteadiness on feet: Secondary | ICD-10-CM | POA: Diagnosis not present

## 2017-11-03 DIAGNOSIS — M21371 Foot drop, right foot: Secondary | ICD-10-CM | POA: Diagnosis not present

## 2017-11-03 DIAGNOSIS — R29818 Other symptoms and signs involving the nervous system: Secondary | ICD-10-CM | POA: Diagnosis not present

## 2017-11-03 NOTE — Patient Instructions (Addendum)
  Patient issued [facility] with the exercise below added:   Ankle Dorsiflexion, Self-Mobilization, Sitting    Sit with feet flat. Slide one foot back until gentle stretch is felt. Keep entire foot on floor. Hold _30__ seconds. Repeat __3_ times per session. Do __1_ sessions per day.  Copyright  VHI. All rights reserved.

## 2017-11-03 NOTE — Telephone Encounter (Signed)
Needs OV re: BP.

## 2017-11-03 NOTE — Therapy (Signed)
[facility] [address] [place], [place], [address] Phone: [phone]   Fax:  [phone]  Physical Therapy Treatment  Patient Details  Name: Paul Terrell MRN: [id number] Date of Birth: 01-01-1931 Referring Provider: Tonia Ghent, MD   Encounter Date: 11/03/2017  PT End of Session - 11/03/17 1608    Visit Number  2    Number of Visits  17    Date for PT Re-Evaluation  12/26/17    Authorization Type  MCR    Authorization Time Period  10/27/17 to 12/26/17    PT Start Time  1450    PT Stop Time  1536    PT Time Calculation (min)  46 min    Activity Tolerance  Patient tolerated treatment well    Behavior During Therapy  [facility] for tasks assessed/performed       Past Medical History:  Diagnosis Date  . Arthritis   . Basal cell carcinoma of scalp 12/16/07   Reexcision (Dr. Merril Abbe. Teressa Senter)  . BPH (benign prostatic hyperplasia)   . CAD (coronary artery disease) 11/01   stent placed  . Cataract   . Diabetes mellitus type II 11/01  . GERD (gastroesophageal reflux disease)    occasionally  . Hearing aid worn   . Hyperlipemia 11/01  . Hypertension 11/01  . Ruptured disc, cervical    Neck C7 (Dr. Pearlie Oyster)    Past Surgical History:  Procedure Laterality Date  . BASAL CELL CARCINOMA EXCISION    . BUNIONECTOMY  02/2000   Dr. Janus Molder - Right  . CERVICAL FUSION    . COLONOSCOPY     2012  . KNEE ARTHROSCOPY  09/1999   Right  . LACERATION REPAIR  09/1999   Right Hand (Dr. Fredna Dow)  . LITHOTRIPSY  1991   Dr. Elyse Jarvis  . ROTATOR CUFF REPAIR  02/05   Left    Vitals:   11/03/17 1453 11/03/17 1457 11/03/17 1530  BP: (!) 194/82 sitting (!) 182/82 standing (!) 182/78 seated, end of session    Subjective Assessment - 11/03/17 1453    Subjective  Reports no falls since last here. Reports he has not fallen since he decided he has to use his cane full time. Reports tingling in both lower legs and feet.     Pertinent History  OA, CAD, DM, Hearing aid, HTN, cervical fusion    Patient Stated Goals  reduce falls     Currently in Pain?  Yes    Pain Score  1     Pain Location  Leg    Pain Orientation  Left;Right    Pain Descriptors / Indicators  Burning    Pain Type  Chronic pain    Pain Radiating Towards  knees to feet    Pain Onset  More than a month ago    Pain Frequency  Constant    Aggravating Factors   lying supine    Pain Relieving Factors  sitting or walking    Effect of Pain on Daily Activities  effecting his sleep                      [facility] Adult PT Treatment/Exercise - 11/03/17 0001      Exercises   Exercises  Ankle      Ankle Exercises: Stretches   [place]us Stretch  3 reps;30 seconds    Soleus Stretch Limitations  seated, pull foot back with heel flat  Balance Exercises - 11/03/17 1554      [place] PROGRAM   Ankle Movements  Sitting;10 reps    Hip ABductor  20 reps alternating    Ankle Plantorflexors  -- 10 reps support    Ankle Dorsiflexors  -- 5 reps; limited Rt ankle ROM    One Leg Stand  10 seconds, support    Sit to Stand  5 reps, bilateral support hands on knees        PT Education - 11/03/17 1606    Education provided  Yes    Education Details  elevated BP's and need to follow-up with his MD; initiated [place]; pt to bring booklet with him each visit    Person(s) Educated  Patient    Methods  Explanation;Demonstration;Handout;Verbal cues    Comprehension  Verbalized understanding;Returned demonstration;Need further instruction;Verbal cues required       PT Short Term Goals - 10/28/17 0640      PT SHORT TERM GOAL #1   Title  Patient will be independent with HEP addressing deficits related to decreased balance. (Target for all STGs 11/26/17)    Time  4    Period  Weeks    Status  New    Target Date  11/26/17      PT SHORT TERM GOAL #2   Title  Patient will improve gait velocity to >= 1.60 ft/sec to demonstrate lesser fall risk.     Baseline  2/4 1.41 ft/sec    Time  4    Period  Weeks    Status  New      PT SHORT TERM GOAL #3   Title  Pt will decrease TUG to <=17 sec (with LRAD) as making progress towards lesser fall risk.     Baseline  TUG 20.35    Time  4    Period  Weeks    Status  New      PT SHORT TERM GOAL #4   Title  Patient will improve DGI to >= 15/28 demonstrating progress towards lesser fall risk.     Baseline  DGI 11/28    Time  4    Period  Weeks    Status  New        PT Long Term Goals - 10/28/17 3382      PT LONG TERM GOAL #1   Title  Patient will be independent with updated HEP to improve balance and strength. (Target for all LTGs 12/26/17)    Time  8    Period  Weeks    Status  New    Target Date  12/26/17      PT LONG TERM GOAL #2   Title  Patient will improve 5X sit to stand to <= 16.7 sec (norm for age) to demonstrate improved LE strength and balance.    Time  8    Period  Weeks    Status  New      PT LONG TERM GOAL #3   Title  Patient will improve gait velocity to >=1.81 ft/sec to indicate lesser fall risk.    Time  8    Period  Weeks    Status  New      PT LONG TERM GOAL #4   Title  Patient will improve TUG to <=15 sec (with LRAD) to demonstrate progress towards lesser fall risk.    Time  8    Period  Weeks    Status  New  PT LONG TERM GOAL #5   Title  Patient will demonstrate floor to chair transfer modified independence to allow him to get up off the floor in the event of a fall.     Time  8    Period  Weeks    Status  New            Plan - 11/03/17 1609    Clinical Impression Statement  Session focused on pt education on risk of high BP readings (SBP 180s-190s); rationale for use of cane to improve balance with bil LE neuropathy; initiated HEP for balance    Rehab Potential  Good    Clinical Impairments Affecting Rehab Potential  BP issues; decreased sensation bil LEs    PT Frequency  2x / week    PT Duration  8 weeks    PT  Treatment/Interventions  ADLs/Self Care Home Management;Electrical Stimulation;DME Instruction;Gait training;Neuromuscular re-education;Balance training;Therapeutic exercise;Therapeutic activities;Functional mobility training;Patient/family education;Orthotic Fit/Training;Manual techniques;Passive range of motion    PT Next Visit Plan  monitor BP for orthostasis and/or elevated BPs;review [place] initiated 2/11 (add exercises as appropriate), LE strengthening, ankle ROM    Consulted and Agree with Plan of Care  Patient       Patient will benefit from skilled therapeutic intervention in order to improve the following deficits and impairments:  Abnormal gait, Cardiopulmonary status limiting activity, Decreased activity tolerance, Decreased balance, Decreased mobility, Decreased knowledge of use of DME, Decreased coordination, Decreased range of motion, Decreased strength, Impaired flexibility, Impaired sensation, Postural dysfunction, Pain  Visit Diagnosis: Unsteadiness on feet  Muscle weakness (generalized)     Problem List Patient Active Problem List   Diagnosis Date Noted  . Spinal stenosis of lumbar region 11/08/2015  . Advance care planning 11/04/2014  . Leg pain 01/30/2014  . Medicare annual wellness visit, subsequent 11/01/2013  . BPH (benign prostatic hyperplasia)   . Type 2 diabetes mellitus with vascular disease ([place])   . Hyperlipidemia   . Hypertensive heart disease   . CAD (coronary artery disease)     Rexanne Mano, PT 11/03/2017, 4:13 PM  [facility] [address] [place], [place], [address] Phone: [phone]   Fax:  [phone]  Name: LARY ECKARDT MRN: [id number] Date of Birth: 09-20-1931

## 2017-11-03 NOTE — Telephone Encounter (Signed)
Dr. [name],  It is a pleasure to be working with Paul Terrell.   I wanted to make you aware that on arrival to Physical Therapy today his seated BP was 194/82,  standing 182/82  and then at the end of the session (sitting) 182/78   I instructed the patient to call your office to discuss these high BPs as I read you have recently been adjusting his BP meds due to orthostasis.   Thank you for your attention to [name]-   Barry Brunner, PT Outpatient Neurorehabilitation [address] [place], [place] [address] [phone]

## 2017-11-04 DIAGNOSIS — E113293 Type 2 diabetes mellitus with mild nonproliferative diabetic retinopathy without macular edema, bilateral: Secondary | ICD-10-CM | POA: Diagnosis not present

## 2017-11-04 DIAGNOSIS — H52203 Unspecified astigmatism, bilateral: Secondary | ICD-10-CM | POA: Diagnosis not present

## 2017-11-04 DIAGNOSIS — Z961 Presence of intraocular lens: Secondary | ICD-10-CM | POA: Diagnosis not present

## 2017-11-04 LAB — HM DIABETES EYE EXAM

## 2017-11-04 NOTE — Telephone Encounter (Signed)
Left detailed message on voicemail to schedule appointment.  °

## 2017-11-05 ENCOUNTER — Ambulatory Visit (INDEPENDENT_AMBULATORY_CARE_PROVIDER_SITE_OTHER): Payer: Medicare Other | Admitting: Family Medicine

## 2017-11-05 ENCOUNTER — Encounter: Payer: Self-pay | Admitting: Family Medicine

## 2017-11-05 VITALS — BP 144/60 | HR 72 | Temp 98.4°F | Wt 171.2 lb

## 2017-11-05 DIAGNOSIS — I119 Hypertensive heart disease without heart failure: Secondary | ICD-10-CM | POA: Diagnosis not present

## 2017-11-05 DIAGNOSIS — E1159 Type 2 diabetes mellitus with other circulatory complications: Secondary | ICD-10-CM | POA: Diagnosis not present

## 2017-11-05 DIAGNOSIS — R2681 Unsteadiness on feet: Secondary | ICD-10-CM | POA: Diagnosis not present

## 2017-11-05 DIAGNOSIS — R809 Proteinuria, unspecified: Secondary | ICD-10-CM | POA: Diagnosis not present

## 2017-11-05 DIAGNOSIS — E1129 Type 2 diabetes mellitus with other diabetic kidney complication: Secondary | ICD-10-CM

## 2017-11-05 LAB — HEMOGLOBIN A1C: Hgb A1c MFr Bld: 8.6 % — ABNORMAL HIGH (ref 4.6–6.5)

## 2017-11-05 NOTE — Assessment & Plan Note (Signed)
eye exam within last year:yes, 11/03/17.  Per patient he has retinopathy.  I'm awaiting the consult note.   Labs pending for today.  A1c collected.  He was not able to produce urine for microalbumin.  He had urinated right before the office visit.  Discussed. Last A1c 7.8 in 07/2017.   D/w pt about foot care and monitoring.

## 2017-11-05 NOTE — Assessment & Plan Note (Signed)
Blood pressure was reasonable today.  I do not want to over treat him and cause orthostatic symptoms.  Continue as is for now.  He can monitor his blood pressure episodically.

## 2017-11-05 NOTE — Assessment & Plan Note (Signed)
Continue using cane.  Fall cautions discussed.  Continue physical therapy.  He agrees.

## 2017-11-05 NOTE — Progress Notes (Signed)
Here for follow up with BP elevated on outside checks.  He thought the BP elevation was due to being at PT, not a BP elevation itself.  He is in PT now for his gait troubles/unsteadiness.  D/w pt.  He is still using his cane.  No falls in the last month.  He plans to continue using his cane.  Fall cautions d/w pt. He is careful on arising before getting moving.  He isn't lightheaded now.    He has 2 new great grand babies.  I offered congrats.    Diabetes:  Using medications without difficulties:yes Hypoglycemic episodes:no Hyperglycemic episodes:no Feet problems: some tingling on the feet noted Blood Sugars averaging: yesterday was 120 in the AM, was 180 this AM at home.  He has variations noted, his sugar is sig diet-influenced, d/w pt.  He tries to cut carbs, his wife loves pasta and potatoes.   eye exam within last year:yes, 11/03/17.  Per patient he has retinopathy.  I'm awaiting the consult note.   Due for labs.   Last A1c 7.8 in 07/2017.   D/w pt about foot care and monitoring.    Meds, vitals, and allergies reviewed.   ROS: Per HPI unless specifically indicated in ROS section   GEN: nad, alert and oriented, hard of hearing.   HEENT: mucous membranes moist NECK: supple w/o LA CV: rrr. PULM: ctab, no inc wob ABD: soft, +bs EXT: trace BLE edema SKIN: no acute rash Walking with cane.

## 2017-11-05 NOTE — Patient Instructions (Addendum)
Go to the lab on the way out.  We'll contact you with your lab report. Plan on recheck in about 3 months.   Take care.  Glad to see you.  Update me as needed.   Your BP was okay for now.

## 2017-11-06 ENCOUNTER — Ambulatory Visit: Payer: Medicare Other

## 2017-11-06 DIAGNOSIS — R2689 Other abnormalities of gait and mobility: Secondary | ICD-10-CM

## 2017-11-06 DIAGNOSIS — R2681 Unsteadiness on feet: Secondary | ICD-10-CM

## 2017-11-06 DIAGNOSIS — M21371 Foot drop, right foot: Secondary | ICD-10-CM | POA: Diagnosis not present

## 2017-11-06 DIAGNOSIS — M6281 Muscle weakness (generalized): Secondary | ICD-10-CM | POA: Diagnosis not present

## 2017-11-06 DIAGNOSIS — R29818 Other symptoms and signs involving the nervous system: Secondary | ICD-10-CM | POA: Diagnosis not present

## 2017-11-06 LAB — MICROALBUMIN / CREATININE URINE RATIO
Creatinine,U: 101.2 mg/dL
Microalb Creat Ratio: 6.3 mg/g (ref 0.0–30.0)
Microalb, Ur: 6.4 mg/dL — ABNORMAL HIGH (ref 0.0–1.9)

## 2017-11-06 NOTE — Therapy (Signed)
[facility] [address] [place], [place], [address] Phone: [phone]   Fax:  [phone]  Physical Therapy Treatment  Patient Details  Name: Paul Terrell MRN: [id number] Date of Birth: [birth year] Referring Provider: Tonia Ghent, MD   Encounter Date: 11/06/2017  PT End of Session - 11/06/17 1610    Visit Number  3    Number of Visits  17    Date for PT Re-Evaluation  12/26/17    Authorization Type  MCR    Authorization Time Period  10/27/17 to 12/26/17    PT Start Time  1317    PT Stop Time  1357    PT Time Calculation (min)  40 min    Equipment Utilized During Treatment  -- min guard to S prn    Activity Tolerance  Patient tolerated treatment well    Behavior During Therapy  [facility] for tasks assessed/performed       Past Medical History:  Diagnosis Date  . Arthritis   . Basal cell carcinoma of scalp 12/16/07   Reexcision (Dr. Merril Abbe. Teressa Senter)  . BPH (benign prostatic hyperplasia)   . CAD (coronary artery disease) 11/01   stent placed  . Cataract   . Diabetes mellitus type II 11/01  . GERD (gastroesophageal reflux disease)    occasionally  . Hearing aid worn   . Hyperlipemia 11/01  . Hypertension 11/01  . Ruptured disc, cervical    Neck C7 (Dr. Pearlie Oyster)    Past Surgical History:  Procedure Laterality Date  . BASAL CELL CARCINOMA EXCISION    . BUNIONECTOMY  02/2000   Dr. Janus Molder - Right  . CERVICAL FUSION    . COLONOSCOPY     2012  . KNEE ARTHROSCOPY  09/1999   Right  . LACERATION REPAIR  09/1999   Right Hand (Dr. Fredna Dow)  . LITHOTRIPSY  1991   Dr. Elyse Jarvis  . ROTATOR CUFF REPAIR  02/05   Left    There were no vitals filed for this visit.  Subjective Assessment - 11/06/17 1320    Subjective  Pt went to the MD re: BP and MD did not place pt on BP medication as pt has hx of hypotension (per pt). Pt denied falls since last visit.     Pertinent History  OA, CAD, DM, Hearing aid, HTN,  cervical fusion    Patient Stated Goals  reduce falls     Currently in Pain?  Yes    Pain Score  -- "I don't know, in the middle."    Pain Location  Ankle    Pain Orientation  Right;Left    Pain Descriptors / Indicators  Sore    Pain Type  Acute pain    Pain Onset  Yesterday    Pain Frequency  Constant    Aggravating Factors   exercises because "I've been inactive"    Pain Relieving Factors  Not sure             Vestibular Assessment - 11/06/17 1329      Orthostatics   BP supine (x 5 minutes)  174/73    HR supine (x 5 minutes)  74 pt denied dizziness.    BP sitting  139/79 pt denied dizziness.    HR sitting  77    BP standing (after 1 minute)  147/65 pt denied dizziness    HR standing (after 1 minute)  82    Orthostatics Comment  Pt denied dizziness, despite change in  systolic BP from supine to sitting positions and diastolic decr. during sit to stand txf.                    Balance Exercises - 11/06/17 1335      OTAGO PROGRAM   Head Movements  Sitting;5 reps    Neck Movements  Sitting;5 reps    Back Extension  Standing;5 reps    Trunk Movements  Standing;5 reps UE support    Ankle Movements  Sitting;10 reps    Hip ABductor  20 reps BLE    Overall OTAGO Comments  Pt required frequent demo and cues for technique. Pt also performed 30 sec. R ankle stretch in seated position (gastroc).        PT Education - 11/06/17 1609    Education provided  Yes    Education Details  PT discussed orthostatic hypotension and elevated BP, and the importance of waiting for lightheadedness to settle prior to amb. Pt's BP decr. significantly during orthostatic testing but pt denied dizziness. PT reviewed and added to OTAGO as tolerated.     Person(s) Educated  Patient    Methods  Explanation;Demonstration;Tactile cues;Verbal cues;Handout    Comprehension  Returned demonstration;Verbalized understanding;Need further instruction       PT Short Term Goals - 10/28/17 0640       PT SHORT TERM GOAL #1   Title  Patient will be independent with HEP addressing deficits related to decreased balance. (Target for all STGs 11/26/17)    Time  4    Period  Weeks    Status  New    Target Date  11/26/17      PT SHORT TERM GOAL #2   Title  Patient will improve gait velocity to >= 1.60 ft/sec to demonstrate lesser fall risk.    Baseline  2/4 1.41 ft/sec    Time  4    Period  Weeks    Status  New      PT SHORT TERM GOAL #3   Title  Pt will decrease TUG to <=17 sec (with LRAD) as making progress towards lesser fall risk.     Baseline  TUG 20.35    Time  4    Period  Weeks    Status  New      PT SHORT TERM GOAL #4   Title  Patient will improve DGI to >= 15/28 demonstrating progress towards lesser fall risk.     Baseline  DGI 11/28    Time  4    Period  Weeks    Status  New        PT Long Term Goals - 10/28/17 3154      PT LONG TERM GOAL #1   Title  Patient will be independent with updated HEP to improve balance and strength. (Target for all LTGs 12/26/17)    Time  8    Period  Weeks    Status  New    Target Date  12/26/17      PT LONG TERM GOAL #2   Title  Patient will improve 5X sit to stand to <= 16.7 sec (norm for age) to demonstrate improved LE strength and balance.    Time  8    Period  Weeks    Status  New      PT LONG TERM GOAL #3   Title  Patient will improve gait velocity to >=1.81 ft/sec to indicate lesser fall risk.    Time  8    Period  Weeks    Status  New      PT LONG TERM GOAL #4   Title  Patient will improve TUG to <=15 sec (with LRAD) to demonstrate progress towards lesser fall risk.    Time  8    Period  Weeks    Status  New      PT LONG TERM GOAL #5   Title  Patient will demonstrate floor to chair transfer modified independence to allow him to get up off the floor in the event of a fall.     Time  8    Period  Weeks    Status  New            Plan - 11/06/17 1612    Clinical Impression Statement  Pt tolerated  additional OTAGO activities but required frequent rest breaks 2/2 fatigue and cues/demo for technique. Pt noted to amb. back to PT gym with [facility] but required several standing rest breaks to maintain balance, PT will trial rollator to ensure safety during amb. Continue with POC.     Rehab Potential  Good    Clinical Impairments Affecting Rehab Potential  BP issues; decreased sensation bil LEs    PT Frequency  2x / week    PT Duration  8 weeks    PT Treatment/Interventions  ADLs/Self Care Home Management;Electrical Stimulation;DME Instruction;Gait training;Neuromuscular re-education;Balance training;Therapeutic exercise;Therapeutic activities;Functional mobility training;Patient/family education;Orthotic Fit/Training;Manual techniques;Passive range of motion    PT Next Visit Plan  monitor BP for orthostasis and/or elevated BPs; finish reviewing [place] initiated 2/11, trial gait training with rollator?,  LE strengthening, ankle ROM    Consulted and Agree with Plan of Care  Patient       Patient will benefit from skilled therapeutic intervention in order to improve the following deficits and impairments:  Abnormal gait, Cardiopulmonary status limiting activity, Decreased activity tolerance, Decreased balance, Decreased mobility, Decreased knowledge of use of DME, Decreased coordination, Decreased range of motion, Decreased strength, Impaired flexibility, Impaired sensation, Postural dysfunction, Pain  Visit Diagnosis: Other abnormalities of gait and mobility  Muscle weakness (generalized)  Unsteadiness on feet     Problem List Patient Active Problem List   Diagnosis Date Noted  . Unsteadiness on feet 11/05/2017  . Spinal stenosis of lumbar region 11/08/2015  . Advance care planning 11/04/2014  . Leg pain 01/30/2014  . Medicare annual wellness visit, subsequent 11/01/2013  . BPH (benign prostatic hyperplasia)   . Type 2 diabetes mellitus with vascular disease ([place])   . Hyperlipidemia   .  Hypertensive heart disease   . CAD (coronary artery disease)     Daenerys Buttram L 11/06/2017, 4:19 PM  [facility] [address] [place], [place], [address] Phone: [phone]   Fax:  [phone]  Name: QUANDRE POLINSKI MRN: [id number] Date of Birth: 03/13/1931  Geoffry Paradise, PT,DPT 11/06/17 4:19 PM Phone: [phone] Fax: [phone]

## 2017-11-06 NOTE — Addendum Note (Signed)
Addended by: Ellamae Sia on: 11/06/2017 08:51 AM   Modules accepted: Orders

## 2017-11-09 ENCOUNTER — Other Ambulatory Visit: Payer: Self-pay | Admitting: Family Medicine

## 2017-11-09 MED ORDER — LISINOPRIL 2.5 MG PO TABS: 2.5000 mg | ORAL_TABLET | Freq: Every day | ORAL | 3 refills | Status: DC

## 2017-11-10 ENCOUNTER — Ambulatory Visit: Payer: Medicare Other | Admitting: Physical Therapy

## 2017-11-10 ENCOUNTER — Encounter: Payer: Self-pay | Admitting: Physical Therapy

## 2017-11-10 VITALS — BP 150/80

## 2017-11-10 DIAGNOSIS — M21371 Foot drop, right foot: Secondary | ICD-10-CM | POA: Diagnosis not present

## 2017-11-10 DIAGNOSIS — M6281 Muscle weakness (generalized): Secondary | ICD-10-CM | POA: Diagnosis not present

## 2017-11-10 DIAGNOSIS — R29818 Other symptoms and signs involving the nervous system: Secondary | ICD-10-CM | POA: Diagnosis not present

## 2017-11-10 DIAGNOSIS — R2681 Unsteadiness on feet: Secondary | ICD-10-CM | POA: Diagnosis not present

## 2017-11-10 DIAGNOSIS — R2689 Other abnormalities of gait and mobility: Secondary | ICD-10-CM | POA: Diagnosis not present

## 2017-11-10 NOTE — Therapy (Signed)
[facility] [address] [place], [place], [address] Phone: [phone]   Fax:  [phone]  Physical Therapy Treatment  Patient Details  Name: Paul Terrell MRN: [id number] Date of Birth: 03/18/1931 Referring Provider: Tonia Ghent, MD   Encounter Date: 11/10/2017  PT End of Session - 11/10/17 1642    Visit Number  4    Number of Visits  17    Date for PT Re-Evaluation  12/26/17    Authorization Type  MCR    Authorization Time Period  10/27/17 to 12/26/17    PT Start Time  1104    PT Stop Time  1146    PT Time Calculation (min)  42 min    Equipment Utilized During Treatment  -- min guard to S prn    Activity Tolerance  Patient tolerated treatment well    Behavior During Therapy  [facility] for tasks assessed/performed       Past Medical History:  Diagnosis Date  . Arthritis   . Basal cell carcinoma of scalp 12/16/07   Reexcision (Dr. Merril Abbe. Teressa Senter)  . BPH (benign prostatic hyperplasia)   . CAD (coronary artery disease) 11/01   stent placed  . Cataract   . Diabetes mellitus type II 11/01  . GERD (gastroesophageal reflux disease)    occasionally  . Hearing aid worn   . Hyperlipemia 11/01  . Hypertension 11/01  . Ruptured disc, cervical    Neck C7 (Dr. Pearlie Oyster)    Past Surgical History:  Procedure Laterality Date  . BASAL CELL CARCINOMA EXCISION    . BUNIONECTOMY  02/2000   Dr. Janus Molder - Right  . CERVICAL FUSION    . COLONOSCOPY     2012  . KNEE ARTHROSCOPY  09/1999   Right  . LACERATION REPAIR  09/1999   Right Hand (Dr. Fredna Dow)  . LITHOTRIPSY  1991   Dr. Elyse Jarvis  . ROTATOR CUFF REPAIR  02/05   Left    Vitals:   11/10/17 1108  BP: (!) 150/80    Subjective Assessment - 11/10/17 1108    Subjective  Reports his blood sugar is more a problem than his BP. Recently has been 118-300. [place] last week when painting inside of his wheelbarrow. Had a hard time getting up.   (Pended)     Pertinent  History  OA, CAD, DM, Hearing aid, HTN, cervical fusion  (Pended)     Patient Stated Goals  reduce falls   (Pended)     Currently in Pain?  Yes  (Pended)     Pain Score  2   (Pended)     Pain Location  Ankle  (Pended)     Pain Orientation  Right;Left  (Pended)     Pain Descriptors / Indicators  Sore  (Pended)     Pain Type  Acute pain  (Pended)     Pain Onset  Yesterday  (Pended)     Pain Frequency  Constant  (Pended)     Pain Relieving Factors  exercises have helped  (Pended)                       [facility] Adult PT Treatment/Exercise - 11/10/17 1639      Transfers   Transfers  Sit to Stand;Stand to Sit    Sit to Stand  6: Modified independent (Device/Increase time)    Stand to Sit  6: Modified independent (Device/Increase time)    Comments  with SPC  Ambulation/Gait   Ambulation/Gait  Yes    Ambulation/Gait Assistance  6: Modified independent (Device/Increase time)    Ambulation Distance (Feet)  80 Feet 230    Assistive device  Straight cane    Gait Pattern  Step-through pattern;Decreased step length - right;Decreased step length - left;Right foot flat;Left foot flat;Decreased trunk rotation;Trunk flexed;Wide base of support;Poor foot clearance - left;Poor foot clearance - right    Ambulation Surface  Indoor      Posture/Postural Control   Posture/Postural Control  Postural limitations    Postural Limitations  Rounded Shoulders;Forward head    Posture Comments  in standing and sitting can reverse rounded shoulders  with vc; improves head retraction but cannot return to neutral          Balance Exercises - 11/10/17 1637      OTAGO PROGRAM   Back Extension  Standing back to counter; 30 sec x 2    Ankle Movements  Sitting;10 reps    Backwards Walking  Support    Tandem Stance  10 seconds, support    One Leg Stand  10 seconds, support    Toe Walk  Support          PT Short Term Goals - 10/28/17 9417      PT SHORT TERM GOAL #1   Title  Patient will  be independent with HEP addressing deficits related to decreased balance. (Target for all STGs 11/26/17)    Time  4    Period  Weeks    Status  New    Target Date  11/26/17      PT SHORT TERM GOAL #2   Title  Patient will improve gait velocity to >= 1.60 ft/sec to demonstrate lesser fall risk.    Baseline  2/4 1.41 ft/sec    Time  4    Period  Weeks    Status  New      PT SHORT TERM GOAL #3   Title  Pt will decrease TUG to <=17 sec (with LRAD) as making progress towards lesser fall risk.     Baseline  TUG 20.35    Time  4    Period  Weeks    Status  New      PT SHORT TERM GOAL #4   Title  Patient will improve DGI to >= 15/28 demonstrating progress towards lesser fall risk.     Baseline  DGI 11/28    Time  4    Period  Weeks    Status  New        PT Long Term Goals - 10/28/17 4081      PT LONG TERM GOAL #1   Title  Patient will be independent with updated HEP to improve balance and strength. (Target for all LTGs 12/26/17)    Time  8    Period  Weeks    Status  New    Target Date  12/26/17      PT LONG TERM GOAL #2   Title  Patient will improve 5X sit to stand to <= 16.7 sec (norm for age) to demonstrate improved LE strength and balance.    Time  8    Period  Weeks    Status  New      PT LONG TERM GOAL #3   Title  Patient will improve gait velocity to >=1.81 ft/sec to indicate lesser fall risk.    Time  8    Period  Weeks  Status  New      PT LONG TERM GOAL #4   Title  Patient will improve TUG to <=15 sec (with LRAD) to demonstrate progress towards lesser fall risk.    Time  8    Period  Weeks    Status  New      PT LONG TERM GOAL #5   Title  Patient will demonstrate floor to chair transfer modified independence to allow him to get up off the floor in the event of a fall.     Time  8    Period  Weeks    Status  New            Plan - 11/10/17 1643    Clinical Impression Statement  Patient more upright and steady with SPC this visit. Continued to  educate on balance exercises for HEP adding advanced standing/walking exercises at counter. Patient reports he is doing exercises daily and feels he can already see an improvement in his walking. Continue to work towards goals.     Rehab Potential  Good    Clinical Impairments Affecting Rehab Potential  BP issues; decreased sensation bil LEs    PT Frequency  2x / week    PT Duration  8 weeks    PT Treatment/Interventions  ADLs/Self Care Home Management;Electrical Stimulation;DME Instruction;Gait training;Neuromuscular re-education;Balance training;Therapeutic exercise;Therapeutic activities;Functional mobility training;Patient/family education;Orthotic Fit/Training;Manual techniques;Passive range of motion    PT Next Visit Plan  monitor BP for orthostasis and/or elevated BPs; finalize [place] initiated 2/11 (remove any ex's not needed from booklet), LE strengthening, ankle ROM    Consulted and Agree with Plan of Care  Patient       Patient will benefit from skilled therapeutic intervention in order to improve the following deficits and impairments:  Abnormal gait, Cardiopulmonary status limiting activity, Decreased activity tolerance, Decreased balance, Decreased mobility, Decreased knowledge of use of DME, Decreased coordination, Decreased range of motion, Decreased strength, Impaired flexibility, Impaired sensation, Postural dysfunction, Pain  Visit Diagnosis: Unsteadiness on feet  Muscle weakness (generalized)     Problem List Patient Active Problem List   Diagnosis Date Noted  . Unsteadiness on feet 11/05/2017  . Spinal stenosis of lumbar region 11/08/2015  . Advance care planning 11/04/2014  . Leg pain 01/30/2014  . Medicare annual wellness visit, subsequent 11/01/2013  . BPH (benign prostatic hyperplasia)   . Type 2 diabetes mellitus with vascular disease ([place])   . Hyperlipidemia   . Hypertensive heart disease   . CAD (coronary artery disease)     Rexanne Mano ,  PT 11/10/2017, 4:48 PM  [facility] [address] [place], [place], [address] Phone: [phone]   Fax:  [phone]  Name: Paul Terrell MRN: [id number] Date of Birth: [birth year]

## 2017-11-11 ENCOUNTER — Telehealth: Payer: Self-pay | Admitting: Family Medicine

## 2017-11-11 ENCOUNTER — Encounter: Payer: Self-pay | Admitting: Family Medicine

## 2017-11-11 MED ORDER — LISINOPRIL 2.5 MG PO TABS: 2.5000 mg | ORAL_TABLET | Freq: Every day | ORAL | 3 refills | Status: DC

## 2017-11-11 NOTE — Telephone Encounter (Signed)
Copied from [facility] [id number]. Topic: Quick Communication - Rx Refill/Question >> Nov 11, 2017  9:38 AM Wynetta Emery, [place] C wrote: Medication:  lisinopril (ZESTRIL) 2.5 MG tablet - Rx should have went to mail order instead of local pharmacy.    Has the patient contacted their pharmacy? No    (Agent: If no, request that the patient contact the pharmacy for the refill.)   Preferred Pharmacy (with phone number or street name): [facility]    Agent: Please be advised that RX refills may take up to 3 business days. We ask that you follow-up with your pharmacy.

## 2017-11-11 NOTE — Telephone Encounter (Signed)
[facility] was called to notify the lisinopril was sent my mistake on 11/09/17. Prescription sent to [facility] as requested by patient.

## 2017-11-13 ENCOUNTER — Ambulatory Visit: Payer: Medicare Other | Admitting: Physical Therapy

## 2017-11-13 ENCOUNTER — Encounter: Payer: Self-pay | Admitting: Physical Therapy

## 2017-11-13 VITALS — BP 180/70

## 2017-11-13 DIAGNOSIS — M6281 Muscle weakness (generalized): Secondary | ICD-10-CM | POA: Diagnosis not present

## 2017-11-13 DIAGNOSIS — R29818 Other symptoms and signs involving the nervous system: Secondary | ICD-10-CM | POA: Diagnosis not present

## 2017-11-13 DIAGNOSIS — R2689 Other abnormalities of gait and mobility: Secondary | ICD-10-CM

## 2017-11-13 DIAGNOSIS — R2681 Unsteadiness on feet: Secondary | ICD-10-CM | POA: Diagnosis not present

## 2017-11-13 DIAGNOSIS — M21371 Foot drop, right foot: Secondary | ICD-10-CM | POA: Diagnosis not present

## 2017-11-13 NOTE — Therapy (Signed)
[facility] [address] [place], [place], [address] Phone: [phone]   Fax:  [phone]  Physical Therapy Treatment  Patient Details  Name: Paul Terrell MRN: [id number] Date of Birth: [birth year] Referring Provider: Tonia Ghent, MD   Encounter Date: 11/13/2017  PT End of Session - 11/13/17 1954    Visit Number  5    Number of Visits  17    Date for PT Re-Evaluation  12/26/17    Authorization Type  MCR    Authorization Time Period  10/27/17 to 12/26/17    PT Start Time  1107    PT Stop Time  1146    PT Time Calculation (min)  39 min    Activity Tolerance  Patient tolerated treatment well    Behavior During Therapy  [facility] for tasks assessed/performed       Past Medical History:  Diagnosis Date  . Arthritis   . Basal cell carcinoma of scalp 12/16/07   Reexcision (Dr. Merril Abbe. Teressa Senter)  . BPH (benign prostatic hyperplasia)   . CAD (coronary artery disease) 11/01   stent placed  . Cataract   . Diabetes mellitus type II 11/01  . GERD (gastroesophageal reflux disease)    occasionally  . Hearing aid worn   . Hyperlipemia 11/01  . Hypertension 11/01  . Ruptured disc, cervical    Neck C7 (Dr. Pearlie Oyster)    Past Surgical History:  Procedure Laterality Date  . BASAL CELL CARCINOMA EXCISION    . BUNIONECTOMY  02/2000   Dr. Janus Molder - Right  . CERVICAL FUSION    . COLONOSCOPY     2012  . KNEE ARTHROSCOPY  09/1999   Right  . LACERATION REPAIR  09/1999   Right Hand (Dr. Fredna Dow)  . LITHOTRIPSY  1991   Dr. Elyse Jarvis  . ROTATOR CUFF REPAIR  02/05   Left    Vitals:   11/13/17 1114  BP: (!) 180/70    Subjective Assessment - 11/13/17 1111    Subjective  Reports MD has changed his BP med and it should come in the mail today. Reports his ankle pain has improved with exercises.     Pertinent History  OA, CAD, DM, Hearing aid, HTN, cervical fusion    Patient Stated Goals  reduce falls     Currently in Pain?   No/denies    Pain Onset  --                      [facility] Adult PT Treatment/Exercise - 11/13/17 1200      Transfers   Transfers  Sit to Stand;Stand to Sit    Sit to Stand  4: Min guard;6: Modified independent (Device/Increase time)    Sit to Stand Details (indicate cue type and reason)  guard for safety without use of UE support; progressed to modified independent no hands    Stand to Sit  6: Modified independent (Device/Increase time)    Comments  5 consecutive; also educated on proper technique with rollator (pt required cues each time however)      Ambulation/Gait   Ambulation/Gait  Yes    Ambulation/Gait Assistance  5: Supervision    Ambulation/Gait Assistance Details  with SPC vs rollator; vc for upright posture and proper use of DME    Ambulation Distance (Feet)  40 Feet 120, 100, 40    Assistive device  4-wheeled walker;Straight cane    Gait Pattern  Step-through pattern;Decreased step length -  right;Decreased step length - left;Right foot flat;Left foot flat;Decreased trunk rotation;Trunk flexed;Wide base of support;Poor foot clearance - left;Poor foot clearance - right    Ambulation Surface  Indoor    Gait Comments  patient with more flexed posture when using rollator and required near constant cues to keep rollator close          Balance Exercises - 11/13/17 1949      [place] PROGRAM   Trunk Movements  Standing;5 reps    Ankle Movements  Sitting;10 reps    Tandem Walk  Support    Sit to Stand  -- 5 reps no hand        PT Education - 11/13/17 1950    Education provided  Yes    Education Details  use of rollator at home when needing to carry items with both hands    Person(s) Educated  Patient    Methods  Explanation;Demonstration;Verbal cues    Comprehension  Verbalized understanding;Returned demonstration;Verbal cues required       PT Short Term Goals - 10/28/17 0640      PT SHORT TERM GOAL #1   Title  Patient will be independent with HEP  addressing deficits related to decreased balance. (Target for all STGs 11/26/17)    Time  4    Period  Weeks    Status  New    Target Date  11/26/17      PT SHORT TERM GOAL #2   Title  Patient will improve gait velocity to >= 1.60 ft/sec to demonstrate lesser fall risk.    Baseline  2/4 1.41 ft/sec    Time  4    Period  Weeks    Status  New      PT SHORT TERM GOAL #3   Title  Pt will decrease TUG to <=17 sec (with LRAD) as making progress towards lesser fall risk.     Baseline  TUG 20.35    Time  4    Period  Weeks    Status  New      PT SHORT TERM GOAL #4   Title  Patient will improve DGI to >= 15/28 demonstrating progress towards lesser fall risk.     Baseline  DGI 11/28    Time  4    Period  Weeks    Status  New        PT Long Term Goals - 10/28/17 0867      PT LONG TERM GOAL #1   Title  Patient will be independent with updated HEP to improve balance and strength. (Target for all LTGs 12/26/17)    Time  8    Period  Weeks    Status  New    Target Date  12/26/17      PT LONG TERM GOAL #2   Title  Patient will improve 5X sit to stand to <= 16.7 sec (norm for age) to demonstrate improved LE strength and balance.    Time  8    Period  Weeks    Status  New      PT LONG TERM GOAL #3   Title  Patient will improve gait velocity to >=1.81 ft/sec to indicate lesser fall risk.    Time  8    Period  Weeks    Status  New      PT LONG TERM GOAL #4   Title  Patient will improve TUG to <=15 sec (with LRAD) to demonstrate progress towards lesser fall  risk.    Time  8    Period  Weeks    Status  New      PT LONG TERM GOAL #5   Title  Patient will demonstrate floor to chair transfer modified independence to allow him to get up off the floor in the event of a fall.     Time  8    Period  Weeks    Status  New            Plan - 11/13/17 1955    Clinical Impression Statement  Focus on finalizing pt's HEP [facility]) and gait training with SPC vs rollator. Patient reports he  already feels his balance has improved. Now that HEP is established will focus on balance training with clinic equipment. Patient agrees with plan and states he knows he can improve further with continued PT.     Rehab Potential  Good    Clinical Impairments Affecting Rehab Potential  BP issues; decreased sensation bil LEs    PT Frequency  2x / week    PT Duration  8 weeks    PT Treatment/Interventions  ADLs/Self Care Home Management;Electrical Stimulation;DME Instruction;Gait training;Neuromuscular re-education;Balance training;Therapeutic exercise;Therapeutic activities;Functional mobility training;Patient/family education;Orthotic Fit/Training;Manual techniques;Passive range of motion    PT Next Visit Plan  monitor BP for orthostasis and/or elevated BPs; try Nustep/Sci-fit for general warm-up; balance training other than [place]: corner exercises (?can do 1" foam?); ?wall bumps    Consulted and Agree with Plan of Care  Patient       Patient will benefit from skilled therapeutic intervention in order to improve the following deficits and impairments:  Abnormal gait, Cardiopulmonary status limiting activity, Decreased activity tolerance, Decreased balance, Decreased mobility, Decreased knowledge of use of DME, Decreased coordination, Decreased range of motion, Decreased strength, Impaired flexibility, Impaired sensation, Postural dysfunction, Pain  Visit Diagnosis: Unsteadiness on feet  Other abnormalities of gait and mobility     Problem List Patient Active Problem List   Diagnosis Date Noted  . Unsteadiness on feet 11/05/2017  . Spinal stenosis of lumbar region 11/08/2015  . Advance care planning 11/04/2014  . Leg pain 01/30/2014  . Medicare annual wellness visit, subsequent 11/01/2013  . BPH (benign prostatic hyperplasia)   . Type 2 diabetes mellitus with vascular disease ([place])   . Hyperlipidemia   . Hypertensive heart disease   . CAD (coronary artery disease)     Rexanne Mano,  PT 11/13/2017, 8:00 PM  [facility] [address] [place], [place], [address] Phone: [phone]   Fax:  [phone]  Name: ARION SHANKLES MRN: [id number] Date of Birth: Feb 08, 1931

## 2017-11-17 ENCOUNTER — Encounter: Payer: Self-pay | Admitting: Physical Therapy

## 2017-11-17 ENCOUNTER — Ambulatory Visit: Payer: Medicare Other | Admitting: Physical Therapy

## 2017-11-17 VITALS — BP 178/72

## 2017-11-17 DIAGNOSIS — M6281 Muscle weakness (generalized): Secondary | ICD-10-CM | POA: Diagnosis not present

## 2017-11-17 DIAGNOSIS — M21371 Foot drop, right foot: Secondary | ICD-10-CM | POA: Diagnosis not present

## 2017-11-17 DIAGNOSIS — R2681 Unsteadiness on feet: Secondary | ICD-10-CM | POA: Diagnosis not present

## 2017-11-17 DIAGNOSIS — R2689 Other abnormalities of gait and mobility: Secondary | ICD-10-CM | POA: Diagnosis not present

## 2017-11-17 DIAGNOSIS — R29818 Other symptoms and signs involving the nervous system: Secondary | ICD-10-CM | POA: Diagnosis not present

## 2017-11-17 NOTE — Therapy (Signed)
[facility] [address] [place], [place], [address] Phone: [phone]   Fax:  [phone]  Physical Therapy Treatment  Patient Details  Name: Paul Terrell MRN: [id number] Date of Birth: 1931/05/16 Referring Provider: Tonia Ghent, MD   Encounter Date: 11/17/2017  PT End of Session - 11/17/17 1206    Visit Number  6    Number of Visits  17    Date for PT Re-Evaluation  12/26/17    Authorization Type  MCR    Authorization Time Period  10/27/17 to 12/26/17    PT Start Time  1109    PT Stop Time  1153    PT Time Calculation (min)  44 min    Activity Tolerance  Patient tolerated treatment well    Behavior During Therapy  [facility] for tasks assessed/performed       Past Medical History:  Diagnosis Date  . Arthritis   . Basal cell carcinoma of scalp 12/16/07   Reexcision (Dr. Merril Abbe. Teressa Senter)  . BPH (benign prostatic hyperplasia)   . CAD (coronary artery disease) 11/01   stent placed  . Cataract   . Diabetes mellitus type II 11/01  . GERD (gastroesophageal reflux disease)    occasionally  . Hearing aid worn   . Hyperlipemia 11/01  . Hypertension 11/01  . Ruptured disc, cervical    Neck C7 (Dr. Pearlie Oyster)    Past Surgical History:  Procedure Laterality Date  . BASAL CELL CARCINOMA EXCISION    . BUNIONECTOMY  02/2000   Dr. Janus Molder - Right  . CERVICAL FUSION    . COLONOSCOPY     2012  . KNEE ARTHROSCOPY  09/1999   Right  . LACERATION REPAIR  09/1999   Right Hand (Dr. Fredna Dow)  . LITHOTRIPSY  1991   Dr. Elyse Jarvis  . ROTATOR CUFF REPAIR  02/05   Left    Vitals:   11/17/17 1117 11/17/17 1125  BP: 140/76 (!) 178/72  After Nustep x 4 minutes, L1    Subjective Assessment - 11/17/17 1114    Subjective  Started his new BP medicine.     Pertinent History  OA, CAD, DM, Hearing aid, HTN, cervical fusion    Patient Stated Goals  reduce falls                       [facility] Adult PT  Treatment/Exercise - 11/17/17 1200      Exercises   Exercises  Knee/Hip      Knee/Hip Exercises: Aerobic   Nustep  L1 x 4 min Steps per motion >50          Balance Exercises - 11/17/17 1201      Balance Exercises: Standing   Standing Eyes Opened  Narrow base of support (BOS);Wide (BOA);Foam/compliant surface;30 secs plus arm movements    Standing Eyes Closed  Wide (BOA);Narrow base of support (BOS);30 secs    Tandem Stance  Eyes open;30 secs with UE movements    Wall Bumps  Hip    Wall Bumps-Hips  Eyes opened;Anterior/posterior;10 reps heel raise, rock back on heels with wall bump    Stepping Strategy  Anterior;Posterior;Lateral;UE support;10 reps    Other Standing Exercises  bottom step with 1 rail; alternating step taps; static standing on ramp (upfacing) EO,EC no UE support        PT Education - 11/17/17 1205    Education provided  Yes    Education Details  benefit of aerobic  exercise (walking or stationary cycle he owns) in brain function/memory    Person(s) Educated  Patient    Methods  Explanation    Comprehension  Verbalized understanding       PT Short Term Goals - 10/28/17 0640      PT SHORT TERM GOAL #1   Title  Patient will be independent with HEP addressing deficits related to decreased balance. (Target for all STGs 11/26/17)    Time  4    Period  Weeks    Status  New    Target Date  11/26/17      PT SHORT TERM GOAL #2   Title  Patient will improve gait velocity to >= 1.60 ft/sec to demonstrate lesser fall risk.    Baseline  2/4 1.41 ft/sec    Time  4    Period  Weeks    Status  New      PT SHORT TERM GOAL #3   Title  Pt will decrease TUG to <=17 sec (with LRAD) as making progress towards lesser fall risk.     Baseline  TUG 20.35    Time  4    Period  Weeks    Status  New      PT SHORT TERM GOAL #4   Title  Patient will improve DGI to >= 15/28 demonstrating progress towards lesser fall risk.     Baseline  DGI 11/28    Time  4    Period  Weeks     Status  New        PT Long Term Goals - 10/28/17 9892      PT LONG TERM GOAL #1   Title  Patient will be independent with updated HEP to improve balance and strength. (Target for all LTGs 12/26/17)    Time  8    Period  Weeks    Status  New    Target Date  12/26/17      PT LONG TERM GOAL #2   Title  Patient will improve 5X sit to stand to <= 16.7 sec (norm for age) to demonstrate improved LE strength and balance.    Time  8    Period  Weeks    Status  New      PT LONG TERM GOAL #3   Title  Patient will improve gait velocity to >=1.81 ft/sec to indicate lesser fall risk.    Time  8    Period  Weeks    Status  New      PT LONG TERM GOAL #4   Title  Patient will improve TUG to <=15 sec (with LRAD) to demonstrate progress towards lesser fall risk.    Time  8    Period  Weeks    Status  New      PT LONG TERM GOAL #5   Title  Patient will demonstrate floor to chair transfer modified independence to allow him to get up off the floor in the event of a fall.     Time  8    Period  Weeks    Status  New            Plan - 11/17/17 1207    Clinical Impression Statement  Focused on higher level balance training with pt overall doing very well. He does become very cautious when attempting wall bumps posteriorly with hips (required assist to lean far enough for hips to reach wall)    Rehab Potential  Good  Clinical Impairments Affecting Rehab Potential  BP issues; decreased sensation bil LEs    PT Frequency  2x / week    PT Duration  8 weeks    PT Treatment/Interventions  ADLs/Self Care Home Management;Electrical Stimulation;DME Instruction;Gait training;Neuromuscular re-education;Balance training;Therapeutic exercise;Therapeutic activities;Functional mobility training;Patient/family education;Orthotic Fit/Training;Manual techniques;Passive range of motion    PT Next Visit Plan  monitor BP for orthostasis and/or elevated BPs; try Nustep/Sci-fit for general warm-up; balance  training on solid and compliant surfaces; corner exercises (try 1" foam or blue airex); had trouble with wall bumps (alternating from heel raise to wall bump--held onto chair in front of him)    Consulted and Agree with Plan of Care  Patient       Patient will benefit from skilled therapeutic intervention in order to improve the following deficits and impairments:  Abnormal gait, Cardiopulmonary status limiting activity, Decreased activity tolerance, Decreased balance, Decreased mobility, Decreased knowledge of use of DME, Decreased coordination, Decreased range of motion, Decreased strength, Impaired flexibility, Impaired sensation, Postural dysfunction, Pain  Visit Diagnosis: Unsteadiness on feet     Problem List Patient Active Problem List   Diagnosis Date Noted  . Unsteadiness on feet 11/05/2017  . Spinal stenosis of lumbar region 11/08/2015  . Advance care planning 11/04/2014  . Leg pain 01/30/2014  . Medicare annual wellness visit, subsequent 11/01/2013  . BPH (benign prostatic hyperplasia)   . Type 2 diabetes mellitus with vascular disease ([place])   . Hyperlipidemia   . Hypertensive heart disease   . CAD (coronary artery disease)     Rexanne Mano, PT 11/17/2017, 12:11 PM  [facility] [address] [place], [place], [address] Phone: [phone]   Fax:  [phone]  Name: Paul Terrell MRN: [id number] Date of Birth: May 01, 1931

## 2017-11-18 ENCOUNTER — Other Ambulatory Visit: Payer: Self-pay | Admitting: Family Medicine

## 2017-11-20 ENCOUNTER — Encounter: Payer: Self-pay | Admitting: Physical Therapy

## 2017-11-20 ENCOUNTER — Ambulatory Visit: Payer: Medicare Other | Admitting: Physical Therapy

## 2017-11-20 VITALS — BP 132/68

## 2017-11-20 DIAGNOSIS — R2689 Other abnormalities of gait and mobility: Secondary | ICD-10-CM

## 2017-11-20 DIAGNOSIS — R2681 Unsteadiness on feet: Secondary | ICD-10-CM | POA: Diagnosis not present

## 2017-11-20 DIAGNOSIS — R29818 Other symptoms and signs involving the nervous system: Secondary | ICD-10-CM | POA: Diagnosis not present

## 2017-11-20 DIAGNOSIS — M6281 Muscle weakness (generalized): Secondary | ICD-10-CM | POA: Diagnosis not present

## 2017-11-20 DIAGNOSIS — M21371 Foot drop, right foot: Secondary | ICD-10-CM | POA: Diagnosis not present

## 2017-11-21 NOTE — Therapy (Signed)
[facility] [address] [place], [place], [address] Phone: [phone]   Fax:  [phone]  Physical Therapy Treatment  Patient Details  Name: Paul Terrell MRN: [id number] Date of Birth: 01-May-1931 Referring Provider: Tonia Ghent, MD   Encounter Date: 11/20/2017  PT End of Session - 11/21/17 1346    Visit Number  7    Number of Visits  17    Date for PT Re-Evaluation  12/26/17    Authorization Type  MCR    Authorization Time Period  10/27/17 to 12/26/17    PT Start Time  1016    PT Stop Time  1100    PT Time Calculation (min)  44 min    Equipment Utilized During Treatment  Gait belt    Activity Tolerance  Patient tolerated treatment well    Behavior During Therapy  [facility] for tasks assessed/performed       Past Medical History:  Diagnosis Date  . Arthritis   . Basal cell carcinoma of scalp 12/16/07   Reexcision (Dr. Merril Abbe. Teressa Senter)  . BPH (benign prostatic hyperplasia)   . CAD (coronary artery disease) 11/01   stent placed  . Cataract   . Diabetes mellitus type II 11/01  . GERD (gastroesophageal reflux disease)    occasionally  . Hearing aid worn   . Hyperlipemia 11/01  . Hypertension 11/01  . Ruptured disc, cervical    Neck C7 (Dr. Pearlie Oyster)    Past Surgical History:  Procedure Laterality Date  . BASAL CELL CARCINOMA EXCISION    . BUNIONECTOMY  02/2000   Dr. Janus Molder - Right  . CERVICAL FUSION    . COLONOSCOPY     2012  . KNEE ARTHROSCOPY  09/1999   Right  . LACERATION REPAIR  09/1999   Right Hand (Dr. Fredna Dow)  . LITHOTRIPSY  1991   Dr. Elyse Jarvis  . ROTATOR CUFF REPAIR  02/05   Left    Vitals:   11/20/17 1021 11/20/17 1045  BP: 140/68 132/68    Subjective Assessment - 11/20/17 1021    Subjective  No new complaints. No falls. Some bil ankle pain today.    Pertinent History  OA, CAD, DM, Hearing aid, HTN, cervical fusion    Patient Stated Goals  reduce falls     Currently in Pain?   Yes    Pain Score  -- pt unable to use the scale, "they hurt today"    Pain Location  Ankle    Pain Orientation  Right;Left    Pain Descriptors / Indicators  Sore    Pain Type  Chronic pain    Pain Onset  In the past 7 days    Pain Frequency  Intermittent    Aggravating Factors   walking alot    Pain Relieving Factors  resting           [facility] Adult PT Treatment/Exercise - 11/20/17 1028      Knee/Hip Exercises: Aerobic   Nustep  level 1 with UE/LE's x 5 minutes with goal >/= 45 steps per minute for strengthening and activity tolerance          Balance Exercises - 11/20/17 1045      Balance Exercises: Standing   Standing Eyes Closed  Wide (BOA);Foam/compliant surface;Limitations;30 secs;Head turns;Other reps (comment)    Wall Bumps  Hip    Wall Bumps-Hips  Eyes opened;Anterior/posterior;Limitations      Balance Exercises: Standing   Standing Eyes Closed Limitations  on  airex in corner with chair in front for safety: EC no head movements, progressing to EC head movements left<>right and up<>down. min gaurd to min assist with no head movements, up to mod assist with head movements due to posterior lean. cues/facilitation provided to correct posture/posterior lean    Wall Bumps Limitations  feet on floor hip width apart: with light touch to chair x 10-12 reps, progressing to no UE support for 10-12 reps. min guard assist with verbal/visual/demo cues on correct technique and UE movements once not touching chair.          PT Short Term Goals - 10/28/17 0640      PT SHORT TERM GOAL #1   Title  Patient will be independent with HEP addressing deficits related to decreased balance. (Target for all STGs 11/26/17)    Time  4    Period  Weeks    Status  New    Target Date  11/26/17      PT SHORT TERM GOAL #2   Title  Patient will improve gait velocity to >= 1.60 ft/sec to demonstrate lesser fall risk.    Baseline  2/4 1.41 ft/sec    Time  4    Period  Weeks    Status  New       PT SHORT TERM GOAL #3   Title  Pt will decrease TUG to <=17 sec (with LRAD) as making progress towards lesser fall risk.     Baseline  TUG 20.35    Time  4    Period  Weeks    Status  New      PT SHORT TERM GOAL #4   Title  Patient will improve DGI to >= 15/28 demonstrating progress towards lesser fall risk.     Baseline  DGI 11/28    Time  4    Period  Weeks    Status  New        PT Long Term Goals - 10/28/17 9381      PT LONG TERM GOAL #1   Title  Patient will be independent with updated HEP to improve balance and strength. (Target for all LTGs 12/26/17)    Time  8    Period  Weeks    Status  New    Target Date  12/26/17      PT LONG TERM GOAL #2   Title  Patient will improve 5X sit to stand to <= 16.7 sec (norm for age) to demonstrate improved LE strength and balance.    Time  8    Period  Weeks    Status  New      PT LONG TERM GOAL #3   Title  Patient will improve gait velocity to >=1.81 ft/sec to indicate lesser fall risk.    Time  8    Period  Weeks    Status  New      PT LONG TERM GOAL #4   Title  Patient will improve TUG to <=15 sec (with LRAD) to demonstrate progress towards lesser fall risk.    Time  8    Period  Weeks    Status  New      PT LONG TERM GOAL #5   Title  Patient will demonstrate floor to chair transfer modified independence to allow him to get up off the floor in the event of a fall.     Time  8    Period  Weeks    Status  New            Plan - 11/20/17 1730    Clinical Impression Statement  Today's skilled session continued to focus on LE strengthening and standing balance activities. Pt with improved performance of wall bumps today needing less cues/assistance. Pt is progressing toward goals and should benefit from continued PT to progress toward unmet goals.     Rehab Potential  Good    Clinical Impairments Affecting Rehab Potential  BP issues; decreased sensation bil LEs    PT Frequency  2x / week    PT Duration  8 weeks     PT Treatment/Interventions  ADLs/Self Care Home Management;Electrical Stimulation;DME Instruction;Gait training;Neuromuscular re-education;Balance training;Therapeutic exercise;Therapeutic activities;Functional mobility training;Patient/family education;Orthotic Fit/Training;Manual techniques;Passive range of motion    PT Next Visit Plan  monitor BP for orthostasis and/or elevated BPs; try Nustep/Sci-fit for general warm-up; balance training on solid and compliant surfaces; corner exercises (blue airex); review wall bumps and progress to standing on complaint surface as able.     Consulted and Agree with Plan of Care  Patient       Patient will benefit from skilled therapeutic intervention in order to improve the following deficits and impairments:  Abnormal gait, Cardiopulmonary status limiting activity, Decreased activity tolerance, Decreased balance, Decreased mobility, Decreased knowledge of use of DME, Decreased coordination, Decreased range of motion, Decreased strength, Impaired flexibility, Impaired sensation, Postural dysfunction, Pain  Visit Diagnosis: Unsteadiness on feet  Other abnormalities of gait and mobility  Muscle weakness (generalized)     Problem List Patient Active Problem List   Diagnosis Date Noted  . Unsteadiness on feet 11/05/2017  . Spinal stenosis of lumbar region 11/08/2015  . Advance care planning 11/04/2014  . Leg pain 01/30/2014  . Medicare annual wellness visit, subsequent 11/01/2013  . BPH (benign prostatic hyperplasia)   . Type 2 diabetes mellitus with vascular disease ([place])   . Hyperlipidemia   . Hypertensive heart disease   . CAD (coronary artery disease)     Willow Ora, PTA, [facility] [address], [place], [place] [address] [phone] 11/21/17, 1:53 PM   Name: Paul Terrell MRN: [id number] Date of Birth: [birth year]

## 2017-11-24 ENCOUNTER — Encounter: Payer: Self-pay | Admitting: Physical Therapy

## 2017-11-24 ENCOUNTER — Ambulatory Visit: Payer: Medicare Other | Attending: Family Medicine | Admitting: Physical Therapy

## 2017-11-24 VITALS — BP 157/77 | HR 73

## 2017-11-24 DIAGNOSIS — R2681 Unsteadiness on feet: Secondary | ICD-10-CM | POA: Diagnosis not present

## 2017-11-24 DIAGNOSIS — M6281 Muscle weakness (generalized): Secondary | ICD-10-CM | POA: Diagnosis not present

## 2017-11-24 DIAGNOSIS — R2689 Other abnormalities of gait and mobility: Secondary | ICD-10-CM

## 2017-11-24 NOTE — Therapy (Signed)
[facility] [address] [place], [place], [address] Phone: [phone]   Fax:  [phone]  Physical Therapy Treatment  Patient Details  Name: Paul Terrell MRN: [id number] Date of Birth: 1931/06/17 Referring Provider: Tonia Ghent, MD   Encounter Date: 11/24/2017  PT End of Session - 11/24/17 1219    Visit Number  8    Number of Visits  17    Date for PT Re-Evaluation  12/26/17    Authorization Type  MCR    Authorization Time Period  10/27/17 to 12/26/17    PT Start Time  1103    PT Stop Time  1145    PT Time Calculation (min)  42 min    Equipment Utilized During Treatment  Gait belt    Activity Tolerance  Patient tolerated treatment well    Behavior During Therapy  [facility] for tasks assessed/performed       Past Medical History:  Diagnosis Date  . Arthritis   . Basal cell carcinoma of scalp 12/16/07   Reexcision (Dr. Merril Abbe. Teressa Senter)  . BPH (benign prostatic hyperplasia)   . CAD (coronary artery disease) 11/01   stent placed  . Cataract   . Diabetes mellitus type II 11/01  . GERD (gastroesophageal reflux disease)    occasionally  . Hearing aid worn   . Hyperlipemia 11/01  . Hypertension 11/01  . Ruptured disc, cervical    Neck C7 (Dr. Pearlie Oyster)    Past Surgical History:  Procedure Laterality Date  . BASAL CELL CARCINOMA EXCISION    . BUNIONECTOMY  02/2000   Dr. Janus Molder - Right  . CERVICAL FUSION    . COLONOSCOPY     2012  . KNEE ARTHROSCOPY  09/1999   Right  . LACERATION REPAIR  09/1999   Right Hand (Dr. Fredna Dow)  . LITHOTRIPSY  1991   Dr. Elyse Jarvis  . ROTATOR CUFF REPAIR  02/05   Left    Vitals:   11/24/17 1105  BP: (!) 157/77  Pulse: 73    Subjective Assessment - 11/24/17 1155    Subjective  Pt reported no pain, no changes in medications, and no falls. Pt also stated, my ankles are feeling better because of my home exercise.     Pertinent History  OA, CAD, DM, Hearing aid, HTN,  cervical fusion    Patient Stated Goals  reduce falls     Currently in Pain?  No/denies         [facility] PT Assessment - 11/24/17 1117      Dynamic Gait Index   Level Surface  Mild Impairment    Change in Gait Speed  Moderate Impairment    Gait with Horizontal Head Turns  Mild Impairment    Gait with Vertical Head Turns  Moderate Impairment    Gait and Pivot Turn  Moderate Impairment    Step Over Obstacle  Moderate Impairment    Step Around Obstacles  Mild Impairment    Steps  Mild Impairment    Total Score  12    DGI comment:  Pt used SPC      Timed Up and Go Test   TUG  Normal TUG    Normal TUG (seconds)  14.6           [facility] Adult PT Treatment/Exercise - 11/24/17 1116      Transfers   Transfers  Sit to Stand;Stand to Sit    Sit to Stand  4: Min guard;6: Modified independent (  Device/Increase time);With upper extremity assist    Sit to Stand Details (indicate cue type and reason)  Guard for safety with use of UE support from chair.    Comments  x 5 during rest breaks during tx.       Ambulation/Gait   Ambulation/Gait  Yes    Ambulation/Gait Assistance  5: Supervision    Ambulation/Gait Assistance Details  With [facility]    Ambulation Distance (Feet)  50 Feet x 4 feet walking around clinic.     Assistive device  Straight cane    Gait Pattern  Step-through pattern;Decreased step length - right;Decreased step length - left;Right foot flat;Left foot flat;Decreased trunk rotation;Trunk flexed;Wide base of support;Poor foot clearance - left;Poor foot clearance - right    Ambulation Surface  Indoor    Gait velocity  32.8/16.2= 2.02 ft/sec    Stairs  Yes    Stairs Assistance  6: Modified independent (Device/Increase time);5: Supervision    Stairs Assistance Details (indicate cue type and reason)  reciprcol step pattern ascending and decsending    Stair Management Technique  One rail Left;One rail Right Ascending and descending with single hand on rail with each    Number of Stairs   4    Height of Stairs  6    Gait Comments  Pt required verbal and demonstration cues for gait activities around PT clinic.       Knee/Hip Exercises: Aerobic   Nustep  L1 with UE/LE's x 8 mins with >/ 45 steps pace (70 steps pace today)           Balance Exercises - 11/24/17 1231      Balance Exercises: Standing   Standing Eyes Opened  Narrow base of support (BOS);Wide (BOA);Foam/compliant surface;30 secs;2 reps    Standing Eyes Closed  Wide (BOA);Foam/compliant surface;30 secs;Head turns;Other reps (comment);2 reps    Other Standing Exercises  Stepping over wooden beam with SPC use. Demo, verbal, and sequences cues required for performance of task.      Balance Exercises: Standing   Standing Eyes Closed Limitations  Standing on airex in corner with chair in front for safety for 30 sec x 2 reps each: EC no head movements, EC head movements left<>right and up<>down. min gaurd to min assist with no head movements, up to mod assist with head movements due to posterior lean. cues/facilitation provided to correct posture/posterior lean and maintain upright posture           PT Short Term Goals - 11/24/17 1219      PT SHORT TERM GOAL #1   Title  Patient will be independent with HEP addressing deficits related to decreased balance. (Target for all STGs 11/26/17)    Baseline  11/24/17: Pt verbally stated he is independent with current HEP. Pt states he feels ankle DF exercises has helped him feel like he is making progress.     Status  Achieved      PT SHORT TERM GOAL #2   Title  Patient will improve gait velocity to >= 1.60 ft/sec to demonstrate lesser fall risk.    Baseline  11/24/17: 2.02 ft/sec with use of SPC to demonstrate lesser fall risk.    Status  Achieved      PT SHORT TERM GOAL #3   Title  Pt will decrease TUG to <=17 sec (with LRAD) as making progress towards lesser fall risk.     Baseline  11/24/17: TUG 14.6 with SPC    Status  Achieved  PT SHORT TERM GOAL #4   Title   Patient will improve DGI to >= 15/28 demonstrating progress towards lesser fall risk.     Baseline  11/24/17: DGI =12/28    Status  Not Met        PT Long Term Goals - 10/28/17 9233      PT LONG TERM GOAL #1   Title  Patient will be independent with updated HEP to improve balance and strength. (Target for all LTGs 12/26/17)    Time  8    Period  Weeks    Status  New    Target Date  12/26/17      PT LONG TERM GOAL #2   Title  Patient will improve 5X sit to stand to <= 16.7 sec (norm for age) to demonstrate improved LE strength and balance.    Time  8    Period  Weeks    Status  New      PT LONG TERM GOAL #3   Title  Patient will improve gait velocity to >=1.81 ft/sec to indicate lesser fall risk.    Time  8    Period  Weeks    Status  New      PT LONG TERM GOAL #4   Title  Patient will improve TUG to <=15 sec (with LRAD) to demonstrate progress towards lesser fall risk.    Time  8    Period  Weeks    Status  New      PT LONG TERM GOAL #5   Title  Patient will demonstrate floor to chair transfer modified independence to allow him to get up off the floor in the event of a fall.     Time  8    Period  Weeks    Status  New            Plan - 11/24/17 1235    Clinical Impression Statement  Today's skilled session focused on increasing endurance with increased time of warm up on nustep, checking STG's and gait test to determine current fall risk (Gait velocity, TUG, and DGI), and challenging standing on uneven surface static balance. Pt verbally demonstrated compliance of HEP program and indepencence. Pt demonstrated improved scores TUG test time to 14.6 secs with use of SPC. Gait velocity improved from 1.60 ft/sec to 2.02 ft/sec with use of SPC. However, Pt DGI score only improved from 11/28 to 12/28 and was not met. Pt is demonstrating continued progress by reaching most STG's. Pt would benefit from continued PT interventions to work towards [place].     History and  Personal Factors relevant to plan of care:  PMH-OA, CAD, DM, Hearing aid, HTN, cervical fusion; Personal factors-age.    Clinical Presentation  Evolving    Clinical Presentation due to:  BP issues with MD continuing to make adjustment to BP medicine     Clinical Decision Making  Low    Rehab Potential  Good    Clinical Impairments Affecting Rehab Potential  BP issues; decreased sensation bil LEs    PT Frequency  2x / week    PT Duration  8 weeks    PT Treatment/Interventions  ADLs/Self Care Home Management;Electrical Stimulation;DME Instruction;Gait training;Neuromuscular re-education;Balance training;Therapeutic exercise;Therapeutic activities;Functional mobility training;Patient/family education;Orthotic Fit/Training;Manual techniques;Passive range of motion    PT Next Visit Plan  Continue to monitor BP for orthostasis and/or elevated BPs; Nustep for warm up 8 min.; balance training on solid and compliant surfaces; corner exercises (blue  airex); review wall bumps and progress to standing on complaint surface as able.     Consulted and Agree with Plan of Care  Patient       Patient will benefit from skilled therapeutic intervention in order to improve the following deficits and impairments:  Abnormal gait, Cardiopulmonary status limiting activity, Decreased activity tolerance, Decreased balance, Decreased mobility, Decreased knowledge of use of DME, Decreased coordination, Decreased range of motion, Decreased strength, Impaired flexibility, Impaired sensation, Postural dysfunction, Pain  Visit Diagnosis: Unsteadiness on feet  Other abnormalities of gait and mobility  Muscle weakness (generalized)     Problem List Patient Active Problem List   Diagnosis Date Noted  . Unsteadiness on feet 11/05/2017  . Spinal stenosis of lumbar region 11/08/2015  . Advance care planning 11/04/2014  . Leg pain 01/30/2014  . Medicare annual wellness visit, subsequent 11/01/2013  . BPH (benign prostatic  hyperplasia)   . Type 2 diabetes mellitus with vascular disease ([place])   . Hyperlipidemia   . Hypertensive heart disease   . CAD (coronary artery disease)     Paul Terrell 11/24/2017, 1:08 PM  [facility] [address] [place], [place], [address] Phone: [phone]   Fax:  [phone]  Name: Paul Terrell MRN: [id number] Date of Birth: 1931-01-18

## 2017-11-27 ENCOUNTER — Ambulatory Visit: Payer: Medicare Other | Admitting: Physical Therapy

## 2017-11-27 VITALS — BP 157/67 | HR 69

## 2017-11-27 DIAGNOSIS — M6281 Muscle weakness (generalized): Secondary | ICD-10-CM | POA: Diagnosis not present

## 2017-11-27 DIAGNOSIS — R2689 Other abnormalities of gait and mobility: Secondary | ICD-10-CM | POA: Diagnosis not present

## 2017-11-27 DIAGNOSIS — R2681 Unsteadiness on feet: Secondary | ICD-10-CM | POA: Diagnosis not present

## 2017-11-27 NOTE — Therapy (Signed)
[facility] [address] [place], [place], [address] Phone: [phone]   Fax:  [phone]  Physical Therapy Treatment  Patient Details  Name: Paul Terrell MRN: [id number] Date of Birth: 11/28/1930 Referring Provider: Tonia Ghent, MD   Encounter Date: 11/27/2017  PT End of Session - 11/27/17 1241    Visit Number  9    Number of Visits  17    Date for PT Re-Evaluation  12/26/17    Authorization Type  MCR    Authorization Time Period  10/27/17 to 12/26/17    PT Start Time  1015    PT Stop Time  1058    PT Time Calculation (min)  43 min    Equipment Utilized During Treatment  Gait belt    Activity Tolerance  Patient tolerated treatment well    Behavior During Therapy  [facility] for tasks assessed/performed       Past Medical History:  Diagnosis Date  . Arthritis   . Basal cell carcinoma of scalp 12/16/07   Reexcision (Dr. Merril Abbe. Teressa Senter)  . BPH (benign prostatic hyperplasia)   . CAD (coronary artery disease) 11/01   stent placed  . Cataract   . Diabetes mellitus type II 11/01  . GERD (gastroesophageal reflux disease)    occasionally  . Hearing aid worn   . Hyperlipemia 11/01  . Hypertension 11/01  . Ruptured disc, cervical    Neck C7 (Dr. Pearlie Oyster)    Past Surgical History:  Procedure Laterality Date  . BASAL CELL CARCINOMA EXCISION    . BUNIONECTOMY  02/2000   Dr. Janus Molder - Right  . CERVICAL FUSION    . COLONOSCOPY     2012  . KNEE ARTHROSCOPY  09/1999   Right  . LACERATION REPAIR  09/1999   Right Hand (Dr. Fredna Dow)  . LITHOTRIPSY  1991   Dr. Elyse Jarvis  . ROTATOR CUFF REPAIR  02/05   Left    Vitals:   11/27/17 1019 11/27/17 1219  BP: (!) 181/69 (!) 157/67  Pulse: 67 69    Subjective Assessment - 11/27/17 1023    Subjective  Pt reported no pain, no changes in medications, and no falls today.     Pertinent History  OA, CAD, DM, Hearing aid, HTN, cervical fusion    Patient Stated Goals   reduce falls     Currently in Pain?  No/denies    Pain Onset  In the past 7 days           [facility] Adult PT Treatment/Exercise - 11/27/17 1024      Transfers   Transfers  Sit to Stand;Stand to Sit    Sit to Stand  4: Min guard;6: Modified independent (Device/Increase time);With upper extremity assist    Stand to Sit  6: Modified independent (Device/Increase time)    Comments  x 5 during rest breaks during tx.       Ambulation/Gait   Ambulation/Gait  Yes    Ambulation/Gait Assistance  5: Supervision    Ambulation/Gait Assistance Details  With [facility]     Ambulation Distance (Feet)  50 Feet plus 40 feet, 50 feet     Assistive device  Straight cane    Gait Pattern  Step-through pattern;Decreased step length - right;Decreased step length - left;Right foot flat;Left foot flat;Decreased trunk rotation;Trunk flexed;Wide base of support;Poor foot clearance - left;Poor foot clearance - right    Ambulation Surface  Indoor    Gait Comments  Pt required verbal  and tactile cues for upright posture. Pt required verbal and demonstration cues for gait activities around PT clinic.       High Level Balance   High Level Balance Activities  Tandem walking Forward and backward tandem walking    High Level Balance Comments  Pt wanted to practice tandem walking fwd and bwd on even floor (one of HEP) due to having increased difficulty with standing tandem on airex pad. Pt able to perform task with single UE support without excessive leaning with task.      Knee/Hip Exercises: Aerobic   Nustep  L2 with UE/LE's x 8 mins with >/ 45 steps pace (60 steps pace today)           Balance Exercises - 11/27/17 1027      Balance Exercises: Standing   Standing Eyes Opened  Narrow base of support (BOS);Wide (BOA);Foam/compliant surface;30 secs;2 reps    Standing Eyes Closed  Wide (BOA);Foam/compliant surface;30 secs;Head turns;Other reps (comment);2 reps    Tandem Stance  Eyes open;30 secs;Foam/compliant  surface;Upper extremity support 1;2 reps On airex pad and at ramp with blue mat      Balance Exercises: Standing   Standing Eyes Closed Limitations  Standing on airex in corner with chair in front for safety for 30 sec x 2 reps each: EO to began and hold upright balance without holding chair; EC no head movements,. EC and tandem stance, EC and narrow BOS. Provided min gaurd to min assist with no head movements, with EC tandem stance mod assist due to posterior lean. cues/facilitation provided to correct posture/posterior lean and maintain upright posture     Tandem Stance Limitations  Pt had swaying in all directions with standing on airex pad in tandem stance. Progressed tandem stand by placing blue mat on ramp, and getting into a tandem stance bilaterally 30 sec x 2 reps. Pt used SPC for single UE support on right hand during standing on ramp with blue mat for balance challenge exercise.           PT Short Term Goals - 11/24/17 1219      PT SHORT TERM GOAL #1   Title  Patient will be independent with HEP addressing deficits related to decreased balance. (Target for all STGs 11/26/17)    Baseline  11/24/17: Pt verbally stated he is independent with current HEP. Pt states he feels ankle DF exercises has helped him feel like he is making progress.     Status  Achieved      PT SHORT TERM GOAL #2   Title  Patient will improve gait velocity to >= 1.60 ft/sec to demonstrate lesser fall risk.    Baseline  11/24/17: 2.02 ft/sec with use of SPC to demonstrate lesser fall risk.    Status  Achieved      PT SHORT TERM GOAL #3   Title  Pt will decrease TUG to <=17 sec (with LRAD) as making progress towards lesser fall risk.     Baseline  11/24/17: TUG 14.6 with SPC    Status  Achieved      PT SHORT TERM GOAL #4   Title  Patient will improve DGI to >= 15/28 demonstrating progress towards lesser fall risk.     Baseline  11/24/17: DGI =12/28    Status  Not Met        PT Long Term Goals - 10/28/17 1610       PT LONG TERM GOAL #1   Title  Patient will  be independent with updated HEP to improve balance and strength. (Target for all LTGs 12/26/17)    Time  8    Period  Weeks    Status  New    Target Date  12/26/17      PT LONG TERM GOAL #2   Title  Patient will improve 5X sit to stand to <= 16.7 sec (norm for age) to demonstrate improved LE strength and balance.    Time  8    Period  Weeks    Status  New      PT LONG TERM GOAL #3   Title  Patient will improve gait velocity to >=1.81 ft/sec to indicate lesser fall risk.    Time  8    Period  Weeks    Status  New      PT LONG TERM GOAL #4   Title  Patient will improve TUG to <=15 sec (with LRAD) to demonstrate progress towards lesser fall risk.    Time  8    Period  Weeks    Status  New      PT LONG TERM GOAL #5   Title  Patient will demonstrate floor to chair transfer modified independence to allow him to get up off the floor in the event of a fall.     Time  8    Period  Weeks    Status  New            Plan - 11/27/17 1242    Clinical Impression Statement  Today's skilled PT session focused on performing Nustep with a level of increased resistance for endurance, performing static balance exercises on uneven surfaces, and dynamic walking. Pt required multiple brief seated breaks throughout session. Pt did require postural cues for upright posture during gait around the clinic and balance activities. Pt continues to make progress towards LTG's. Pt would benefit from skilled PT to make progress towards unmet LTG's.     History and Personal Factors relevant to plan of care:  PMH-OA, CAD, DM, Hearing aid, HN, cervical fusion, Personal factors-age    Clinical Presentation  Evolving    Clinical Presentation due to:  BP issues with MD contining to make adustment to BP medicine    Clinical Decision Making  Low    Clinical Impairments Affecting Rehab Potential  BP issues; decreased sensation bil LEs    PT Frequency  2x / week    PT  Duration  8 weeks    PT Treatment/Interventions  ADLs/Self Care Home Management;Electrical Stimulation;DME Instruction;Gait training;Neuromuscular re-education;Balance training;Therapeutic exercise;Therapeutic activities;Functional mobility training;Patient/family education;Orthotic Fit/Training;Manual techniques;Passive range of motion    PT Next Visit Plan  Continue to monitor BP for elevated BP.  Nustep for warm up 8 min.; balance training corner exercises (blue airex, tandem); rockerboard activities, review wall bumps and progress to standing on complaint surface as able.     Consulted and Agree with Plan of Care  Patient       Patient will benefit from skilled therapeutic intervention in order to improve the following deficits and impairments:     Visit Diagnosis: Unsteadiness on feet  Other abnormalities of gait and mobility  Muscle weakness (generalized)     Problem List Patient Active Problem List   Diagnosis Date Noted  . Unsteadiness on feet 11/05/2017  . Spinal stenosis of lumbar region 11/08/2015  . Advance care planning 11/04/2014  . Leg pain 01/30/2014  . Medicare annual wellness visit, subsequent 11/01/2013  . BPH (benign  prostatic hyperplasia)   . Type 2 diabetes mellitus with vascular disease ([place])   . Hyperlipidemia   . Hypertensive heart disease   . CAD (coronary artery disease)     Carlena Sax 11/27/2017, 12:52 PM  [facility] [address] [place], [place], [address] Phone: [phone]   Fax:  [phone]  Name: WAEL MAESTAS MRN: [id number] Date of Birth: November 04, 1930

## 2017-12-01 ENCOUNTER — Encounter: Payer: Self-pay | Admitting: Physical Therapy

## 2017-12-01 ENCOUNTER — Ambulatory Visit: Payer: Medicare Other | Admitting: Physical Therapy

## 2017-12-01 VITALS — BP 163/61 | HR 68

## 2017-12-01 DIAGNOSIS — R2681 Unsteadiness on feet: Secondary | ICD-10-CM

## 2017-12-01 DIAGNOSIS — R2689 Other abnormalities of gait and mobility: Secondary | ICD-10-CM

## 2017-12-01 DIAGNOSIS — M6281 Muscle weakness (generalized): Secondary | ICD-10-CM | POA: Diagnosis not present

## 2017-12-01 NOTE — Therapy (Signed)
[facility] [address] [place], [place], [address] Phone: [phone]   Fax:  [phone]  Physical Therapy Treatment  Patient Details  Name: Paul Terrell MRN: [id number] Date of Birth: 11-03-1930 Referring Provider: Tonia Ghent, MD   Encounter Date: 12/01/2017  PT End of Session - 12/01/17 1924    Visit Number  10    Number of Visits  17    Date for PT Re-Evaluation  12/26/17    Authorization Type  MCR    Authorization Time Period  10/27/17 to 12/26/17    PT Start Time  1103    PT Stop Time  1143    PT Time Calculation (min)  40 min    Equipment Utilized During Treatment  Gait belt    Activity Tolerance  Patient tolerated treatment well    Behavior During Therapy  [facility] for tasks assessed/performed       Past Medical History:  Diagnosis Date  . Arthritis   . Basal cell carcinoma of scalp 12/16/07   Reexcision (Dr. Merril Abbe. Teressa Senter)  . BPH (benign prostatic hyperplasia)   . CAD (coronary artery disease) 11/01   stent placed  . Cataract   . Diabetes mellitus type II 11/01  . GERD (gastroesophageal reflux disease)    occasionally  . Hearing aid worn   . Hyperlipemia 11/01  . Hypertension 11/01  . Ruptured disc, cervical    Neck C7 (Dr. Pearlie Oyster)    Past Surgical History:  Procedure Laterality Date  . BASAL CELL CARCINOMA EXCISION    . BUNIONECTOMY  02/2000   Dr. Janus Molder - Right  . CERVICAL FUSION    . COLONOSCOPY     2012  . KNEE ARTHROSCOPY  09/1999   Right  . LACERATION REPAIR  09/1999   Right Hand (Dr. Fredna Dow)  . LITHOTRIPSY  1991   Dr. Elyse Jarvis  . ROTATOR CUFF REPAIR  02/05   Left    Vitals:   12/01/17 1112  BP: (!) 163/61  Pulse: 68    Subjective Assessment - 12/01/17 1923    Subjective  Pt reported no pain, no changes in medications, and no falls. Reports he is doing his HEP    Pertinent History  OA, CAD, DM, Hearing aid, HTN, cervical fusion    Patient Stated Goals  reduce  falls     Currently in Pain?  No/denies    Pain Onset  In the past 7 days                      [facility] Adult PT Treatment/Exercise - 12/01/17 1116      Ambulation/Gait   Ambulation/Gait  Yes    Ambulation/Gait Assistance  5: Supervision    Ambulation/Gait Assistance Details  with SPC and cues for upright postrure; dual task with cognitive challenge; 2nd long walk with sudden stops and changes of directions with pt maintaining balance    Ambulation Distance (Feet)  100 Feet 300, 250    Assistive device  Straight cane    Gait Pattern  Step-through pattern;Decreased step length - right;Decreased step length - left;Right foot flat;Left foot flat;Decreased trunk rotation;Trunk flexed;Wide base of support;Poor foot clearance - left;Poor foot clearance - right    Ambulation Surface  Indoor      Knee/Hip Exercises: Aerobic   Nustep  L3 UE/LE x 8 min with steps per minute ~60      Knee/Hip Exercises: Standing   Other Standing Knee Exercises  sit to stand on blue airex and no UE support               PT Short Term Goals - 11/24/17 1219      PT SHORT TERM GOAL #1   Title  Patient will be independent with HEP addressing deficits related to decreased balance. (Target for all STGs 11/26/17)    Baseline  11/24/17: Pt verbally stated he is independent with current HEP. Pt states he feels ankle DF exercises has helped him feel like he is making progress.     Status  Achieved      PT SHORT TERM GOAL #2   Title  Patient will improve gait velocity to >= 1.60 ft/sec to demonstrate lesser fall risk.    Baseline  11/24/17: 2.02 ft/sec with use of SPC to demonstrate lesser fall risk.    Status  Achieved      PT SHORT TERM GOAL #3   Title  Pt will decrease TUG to <=17 sec (with LRAD) as making progress towards lesser fall risk.     Baseline  11/24/17: TUG 14.6 with SPC    Status  Achieved      PT SHORT TERM GOAL #4   Title  Patient will improve DGI to >= 15/28 demonstrating progress  towards lesser fall risk.     Baseline  11/24/17: DGI =12/28    Status  Not Met        PT Long Term Goals - 10/28/17 9147      PT LONG TERM GOAL #1   Title  Patient will be independent with updated HEP to improve balance and strength. (Target for all LTGs 12/26/17)    Time  8    Period  Weeks    Status  New    Target Date  12/26/17      PT LONG TERM GOAL #2   Title  Patient will improve 5X sit to stand to <= 16.7 sec (norm for age) to demonstrate improved LE strength and balance.    Time  8    Period  Weeks    Status  New      PT LONG TERM GOAL #3   Title  Patient will improve gait velocity to >=1.81 ft/sec to indicate lesser fall risk.    Time  8    Period  Weeks    Status  New      PT LONG TERM GOAL #4   Title  Patient will improve TUG to <=15 sec (with LRAD) to demonstrate progress towards lesser fall risk.    Time  8    Period  Weeks    Status  New      PT LONG TERM GOAL #5   Title  Patient will demonstrate floor to chair transfer modified independence to allow him to get up off the floor in the event of a fall.     Time  8    Period  Weeks    Status  New            Plan - 12/01/17 1926    Clinical Impression Statement  Session focused on gait training, balance training, and improving his endurance for activities. Patient did not request a single rest break throughout his session. HIs walking posture is limited by his reports of incr back pain if her stands upright. Patient can continue to benefit from PT to reduce fall rsk.     Clinical Impairments Affecting Rehab Potential  BP issues;  decreased sensation bil LEs    PT Frequency  2x / week    PT Duration  8 weeks    PT Treatment/Interventions  ADLs/Self Care Home Management;Electrical Stimulation;DME Instruction;Gait training;Neuromuscular re-education;Balance training;Therapeutic exercise;Therapeutic activities;Functional mobility training;Patient/family education;Orthotic Fit/Training;Manual techniques;Passive  range of motion    PT Next Visit Plan  Continue to monitor BP for elevated BP.  Nustep for warm up x 8+ min (L3, steps/minute ~60).; balance training corner exercises (blue airex, tandem); rockerboard activities, review wall bumps and progress to standing on complaint surface as able.     Consulted and Agree with Plan of Care  Patient       Patient will benefit from skilled therapeutic intervention in order to improve the following deficits and impairments:  Abnormal gait, Cardiopulmonary status limiting activity, Decreased activity tolerance, Decreased balance, Decreased mobility, Decreased knowledge of use of DME, Decreased coordination, Decreased range of motion, Decreased strength, Impaired flexibility, Impaired sensation, Postural dysfunction, Pain  Visit Diagnosis: Unsteadiness on feet  Other abnormalities of gait and mobility  Muscle weakness (generalized)     Problem List Patient Active Problem List   Diagnosis Date Noted  . Unsteadiness on feet 11/05/2017  . Spinal stenosis of lumbar region 11/08/2015  . Advance care planning 11/04/2014  . Leg pain 01/30/2014  . Medicare annual wellness visit, subsequent 11/01/2013  . BPH (benign prostatic hyperplasia)   . Type 2 diabetes mellitus with vascular disease ([place])   . Hyperlipidemia   . Hypertensive heart disease   . CAD (coronary artery disease)     Rexanne Mano, PT 12/01/2017, 7:31 PM  [facility] [address] [place], [place], [address] Phone: [phone]   Fax:  [phone]  Name: Paul Terrell MRN: [id number] Date of Birth: [birth year]

## 2017-12-04 ENCOUNTER — Ambulatory Visit: Payer: Medicare Other | Admitting: Physical Therapy

## 2017-12-04 ENCOUNTER — Encounter: Payer: Self-pay | Admitting: Physical Therapy

## 2017-12-04 VITALS — BP 141/66 | HR 74

## 2017-12-04 DIAGNOSIS — R2681 Unsteadiness on feet: Secondary | ICD-10-CM | POA: Diagnosis not present

## 2017-12-04 DIAGNOSIS — R2689 Other abnormalities of gait and mobility: Secondary | ICD-10-CM | POA: Diagnosis not present

## 2017-12-04 DIAGNOSIS — M6281 Muscle weakness (generalized): Secondary | ICD-10-CM | POA: Diagnosis not present

## 2017-12-04 NOTE — Therapy (Signed)
[facility] [address] [place], [place], [address] Phone: [phone]   Fax:  [phone]  Physical Therapy Treatment  Patient Details  Name: Paul Terrell MRN: [id number] Date of Birth: Feb 04, 1931 Referring Provider: Tonia Ghent, MD   Encounter Date: 12/04/2017  PT End of Session - 12/04/17 1404    Visit Number  11    Number of Visits  17    Date for PT Re-Evaluation  12/26/17    Authorization Type  MCR    Authorization Time Period  10/27/17 to 12/26/17    PT Start Time  1315    PT Stop Time  1402    PT Time Calculation (min)  47 min    Activity Tolerance  Patient tolerated treatment well    Behavior During Therapy  [facility] for tasks assessed/performed       Past Medical History:  Diagnosis Date  . Arthritis   . Basal cell carcinoma of scalp 12/16/07   Reexcision (Dr. Merril Abbe. Teressa Senter)  . BPH (benign prostatic hyperplasia)   . CAD (coronary artery disease) 11/01   stent placed  . Cataract   . Diabetes mellitus type II 11/01  . GERD (gastroesophageal reflux disease)    occasionally  . Hearing aid worn   . Hyperlipemia 11/01  . Hypertension 11/01  . Ruptured disc, cervical    Neck C7 (Dr. Pearlie Oyster)    Past Surgical History:  Procedure Laterality Date  . BASAL CELL CARCINOMA EXCISION    . BUNIONECTOMY  02/2000   Dr. Janus Molder - Right  . CERVICAL FUSION    . COLONOSCOPY     2012  . KNEE ARTHROSCOPY  09/1999   Right  . LACERATION REPAIR  09/1999   Right Hand (Dr. Fredna Dow)  . LITHOTRIPSY  1991   Dr. Elyse Jarvis  . ROTATOR CUFF REPAIR  02/05   Left    Vitals:   12/04/17 1320  BP: (!) 141/66  Pulse: 74    Subjective Assessment - 12/04/17 1314    Subjective  Pt reported no pain. Reports he fell in his yard yesterday when reaching down to pick up a hose and fell forward onto his nose. He did not have his cane with him. He was able to use a nearby short fence to get himself up. Reports he is doing  his HEP. Feels better and knows he's better because he feels better walking and can now bend down to reach and put his socks (in sitting).     Pertinent History  OA, CAD, DM, Hearing aid, HTN, cervical fusion    Patient Stated Goals  reduce falls     Currently in Pain?  No/denies    Pain Onset  --        Treatment-  Warm-up and cardiopulmonary endurance on Nustep x 6 minutes, L2, 55-60 steps per minute. Progressed to educate on floor transfers. Patient able to move from stand to quadruped onto red floor mat with minguard assist, vc for moving into supine to simulate how pt would get up from a fall. Patient able to progress from supine back to quadruped with minguard assist with vc for sequencing/technique. Quadruped to 1/2 kneeling with bil hands on floor, could not rise to stand with hands on raised thigh. Provided with his cane turned to put handle on the ground for larger BOS. Pt then able to push on cane with bil UEs and rise to stand with minguard assist. (Simulating a fall in his yard  with nothing to use to get up but his cane).   Balance training with various surfaces: rockerboard, red/grey beam, ramp with vestibular challenges with head turns and/or EC. Patient able to progress to step off/on rockerboard and then beam with only single UE support on // bars. Stepping strategies on compliant surface anterior and posterior. Ambulation with SPC with head turns.                       PT Education - 12/04/17 1402    Education provided  Yes    Education Details  excellent progress; discussed updating LTGs to include more outdoor activities (real if weather permits or simulated) especially due to recent fall in the yard    Person(s) Educated  Patient    Methods  Explanation    Comprehension  Verbalized understanding       PT Short Term Goals - 11/24/17 1219      PT SHORT TERM GOAL #1   Title  Patient will be independent with HEP addressing deficits related to decreased  balance. (Target for all STGs 11/26/17)    Baseline  11/24/17: Pt verbally stated he is independent with current HEP. Pt states he feels ankle DF exercises has helped him feel like he is making progress.     Status  Achieved      PT SHORT TERM GOAL #2   Title  Patient will improve gait velocity to >= 1.60 ft/sec to demonstrate lesser fall risk.    Baseline  11/24/17: 2.02 ft/sec with use of SPC to demonstrate lesser fall risk.    Status  Achieved      PT SHORT TERM GOAL #3   Title  Pt will decrease TUG to <=17 sec (with LRAD) as making progress towards lesser fall risk.     Baseline  11/24/17: TUG 14.6 with SPC    Status  Achieved      PT SHORT TERM GOAL #4   Title  Patient will improve DGI to >= 15/28 demonstrating progress towards lesser fall risk.     Baseline  11/24/17: DGI =12/28    Status  Not Met        PT Long Term Goals - 12/04/17 2033      PT LONG TERM GOAL #1   Title  Patient will be independent with updated HEP to improve balance and strength. (Target for all LTGs 12/26/17)    Time  8    Period  Weeks    Status  New      PT LONG TERM GOAL #2   Title  Patient will improve 5X sit to stand to <= 16.7 sec (norm for age) to demonstrate improved LE strength and balance.    Time  8    Period  Weeks    Status  New      PT LONG TERM GOAL #3   Title  Patient will improve gait velocity to >=1.81 ft/sec to indicate lesser fall risk. (revised to 2.62 ft/sec for incr safety in community)    Baseline  12/04/17 revised due to progress    Time  8    Period  Weeks    Status  Revised      PT LONG TERM GOAL #4   Title  Patient will improve TUG to <=15 sec (with LRAD) to demonstrate progress towards lesser fall risk. (Revised to <=13.5 sec)    Baseline  12/04/17 revised due to progress    Time  8  Period  Weeks    Status  Revised      PT LONG TERM GOAL #5   Title  Patient will demonstrate floor to chair transfer modified independence to allow him to get up off the floor in the event of  a fall.     Time  8    Period  Weeks    Status  New      Additional Long Term Goals   Additional Long Term Goals  Yes      PT LONG TERM GOAL #6   Title  Patient will ambulate modified independent x 300 ft on outdoor unlevel surfaces with SPC.     Status  Revised            Plan - 12/04/17 2040    Clinical Impression Statement  Patient overall reports improvement with PT and exercises. Noted at STG assessment he had actually not only met his STG but also the levels set for his LTGs. Discussed potential discharge early, however pt then reports his fall in his yard yesterday. Patient prefers to continue PT and upgrade his goals as he feels he still has room to improve. See LTGs with goal date remaining the same.     Rehab Potential  Good    Clinical Impairments Affecting Rehab Potential  BP issues; decreased sensation bil LEs    PT Frequency  2x / week    PT Duration  8 weeks    PT Treatment/Interventions  ADLs/Self Care Home Management;Electrical Stimulation;DME Instruction;Gait training;Neuromuscular re-education;Balance training;Therapeutic exercise;Therapeutic activities;Functional mobility training;Patient/family education;Orthotic Fit/Training;Manual techniques;Passive range of motion    PT Next Visit Plan  Continue to monitor BP for elevated BP.  Nustep for warm up (up to 8+ min) (L3, steps/minute ~60).; review floor transfer to see if he recalls technique used 3/14; outdoor/unlevel gait with SPC; review wall bumps and progress to standing on complaint surface as able.     Consulted and Agree with Plan of Care  Patient       Patient will benefit from skilled therapeutic intervention in order to improve the following deficits and impairments:  Abnormal gait, Cardiopulmonary status limiting activity, Decreased activity tolerance, Decreased balance, Decreased mobility, Decreased knowledge of use of DME, Decreased coordination, Decreased range of motion, Decreased strength, Impaired  flexibility, Impaired sensation, Postural dysfunction, Pain  Visit Diagnosis: Unsteadiness on feet  Other abnormalities of gait and mobility     Problem List Patient Active Problem List   Diagnosis Date Noted  . Unsteadiness on feet 11/05/2017  . Spinal stenosis of lumbar region 11/08/2015  . Advance care planning 11/04/2014  . Leg pain 01/30/2014  . Medicare annual wellness visit, subsequent 11/01/2013  . BPH (benign prostatic hyperplasia)   . Type 2 diabetes mellitus with vascular disease ([place])   . Hyperlipidemia   . Hypertensive heart disease   . CAD (coronary artery disease)     Rexanne Mano, PT 12/04/2017, 9:41 PM  [facility] [address] [place], [place], [address] Phone: [phone]   Fax:  [phone]  Name: TRUSTEN HUME MRN: [id number] Date of Birth: July 29, 1931

## 2017-12-08 ENCOUNTER — Ambulatory Visit: Payer: Medicare Other | Admitting: Physical Therapy

## 2017-12-08 ENCOUNTER — Encounter: Payer: Self-pay | Admitting: Physical Therapy

## 2017-12-08 VITALS — BP 154/72

## 2017-12-08 DIAGNOSIS — R2689 Other abnormalities of gait and mobility: Secondary | ICD-10-CM

## 2017-12-08 DIAGNOSIS — R2681 Unsteadiness on feet: Secondary | ICD-10-CM | POA: Diagnosis not present

## 2017-12-08 DIAGNOSIS — M6281 Muscle weakness (generalized): Secondary | ICD-10-CM | POA: Diagnosis not present

## 2017-12-08 NOTE — Therapy (Signed)
[facility] [address] [place], [place], [address] Phone: [phone]   Fax:  [phone]  Physical Therapy Treatment and Discharge Summary  Patient Details  Name: Paul Terrell MRN: [id number] Date of Birth: 10-07-1930 Referring Provider: Tonia Ghent, MD   Encounter Date: 12/08/2017  PT End of Session - 12/08/17 1131    Visit Number  12    Number of Visits  17    Date for PT Re-Evaluation  12/26/17    Authorization Type  MCR    Authorization Time Period  10/27/17 to 12/26/17    PT Start Time  1100    PT Stop Time  1128 discharge visit; did not need full time    PT Time Calculation (min)  28 min    Activity Tolerance  Patient tolerated treatment well    Behavior During Therapy  [facility] for tasks assessed/performed       Past Medical History:  Diagnosis Date  . Arthritis   . Basal cell carcinoma of scalp 12/16/07   Reexcision (Dr. Merril Abbe. Teressa Senter)  . BPH (benign prostatic hyperplasia)   . CAD (coronary artery disease) 11/01   stent placed  . Cataract   . Diabetes mellitus type II 11/01  . GERD (gastroesophageal reflux disease)    occasionally  . Hearing aid worn   . Hyperlipemia 11/01  . Hypertension 11/01  . Ruptured disc, cervical    Neck C7 (Dr. Pearlie Oyster)    Past Surgical History:  Procedure Laterality Date  . BASAL CELL CARCINOMA EXCISION    . BUNIONECTOMY  02/2000   Dr. Janus Molder - Right  . CERVICAL FUSION    . COLONOSCOPY     2012  . KNEE ARTHROSCOPY  09/1999   Right  . LACERATION REPAIR  09/1999   Right Hand (Dr. Fredna Dow)  . LITHOTRIPSY  1991   Dr. Elyse Jarvis  . ROTATOR CUFF REPAIR  02/05   Left    Vitals:   12/08/17 1109  BP: (!) 154/72    Subjective Assessment - 12/08/17 1104    Subjective  I think today is going to be my last day. No falls.     Pertinent History  OA, CAD, DM, Hearing aid, HTN, cervical fusion    Patient Stated Goals  reduce falls     Currently in Pain?   No/denies         [facility] PT Assessment - 12/08/17 1124      Timed Up and Go Test   Normal TUG (seconds)  14.09 with cane; no cane 16.57 sec                  [facility] Adult PT Treatment/Exercise - 12/08/17 1124      Transfers   Transfers  Floor to Transfer    Five time sit to stand comments   12.72    Floor to Transfer  5: Supervision    Floor to Transfer Details (indicate cue type and reason)  down to quadruped from standing, to supine independently; up to quadruped modified independent, however needed vc for technique to use cane to help him stand up and not the bed (to simulate falling when outside); no physical assist needed       Ambulation/Gait   Ambulation/Gait  Yes    Ambulation/Gait Assistance  5: Supervision    Ambulation/Gait Assistance Details  on unlevel ground had several imbalances but with independent recovery with use of cane    Ambulation Distance (Feet)  400 Feet    Assistive device  Straight cane    Gait Pattern  Step-through pattern;Decreased step length - right;Decreased step length - left;Decreased trunk rotation;Trunk flexed;Poor foot clearance - left;Poor foot clearance - right;Wide base of support    Ambulation Surface  Outdoor;Paved;Gravel;Grass mulch    Gait velocity  32.8/14.34=2.29 ft/sec    Curb  6: Modified independent (Device/increase time) with [facility]               PT Short Term Goals - 11/24/17 1219      PT SHORT TERM GOAL #1   Title  Patient will be independent with HEP addressing deficits related to decreased balance. (Target for all STGs 11/26/17)    Baseline  11/24/17: Pt verbally stated he is independent with current HEP. Pt states he feels ankle DF exercises has helped him feel like he is making progress.     Status  Achieved      PT SHORT TERM GOAL #2   Title  Patient will improve gait velocity to >= 1.60 ft/sec to demonstrate lesser fall risk.    Baseline  11/24/17: 2.02 ft/sec with use of SPC to demonstrate lesser fall risk.     Status  Achieved      PT SHORT TERM GOAL #3   Title  Pt will decrease TUG to <=17 sec (with LRAD) as making progress towards lesser fall risk.     Baseline  11/24/17: TUG 14.6 with SPC    Status  Achieved      PT SHORT TERM GOAL #4   Title  Patient will improve DGI to >= 15/28 demonstrating progress towards lesser fall risk.     Baseline  11/24/17: DGI =12/28    Status  Not Met        PT Long Term Goals - 12/08/17 1138      PT LONG TERM GOAL #1   Title  Patient will be independent with updated HEP to improve balance and strength. (Target for all LTGs 12/26/17)    Time  8    Period  Weeks    Status  Achieved      PT LONG TERM GOAL #2   Title  Patient will improve 5X sit to stand to <= 16.7 sec (norm for age) to demonstrate improved LE strength and balance.    Baseline  12/08/17 12.72 sec    Time  8    Period  Weeks    Status  Achieved      PT LONG TERM GOAL #3   Title  Patient will improve gait velocity to >=1.81 ft/sec to indicate lesser fall risk. (revised to 2.62 ft/sec for incr safety in community)    Baseline  12/04/17 revised due to progress; 12/08/17 2.29 ft/sec    Time  8    Period  Weeks    Status  Partially Met      PT LONG TERM GOAL #4   Title  Patient will improve TUG to <=15 sec (with LRAD) to demonstrate progress towards lesser fall risk. (Revised to <=13.5 sec)    Baseline  12/04/17 revised due to progress; 12/08/17 14.09 sec     Time  8    Period  Weeks    Status  Partially Met      PT LONG TERM GOAL #5   Title  Patient will demonstrate floor to chair transfer modified independence to allow him to get up off the floor in the event of a fall.  Baseline  12/08/17 supervision with cues    Time  8    Period  Weeks    Status  Partially Met      PT LONG TERM GOAL #6   Title  Patient will ambulate modified independent x 300 ft on outdoor unlevel surfaces with SPC.     Baseline  12/08/17 supervision with no physical assist    Status  Partially Met             Plan - 12/08/17 1137    Clinical Impression Statement  Patient reports he has improved and plans to continue to do his HEP to maintain his gains. Reports he feels he has benefitted and no longer needs to attend PT and today will be his last session. Now that the weather is better, he looks forward to walking outside more. LTGs were just revised 12/04/17 due to his progress. Even with recent revision, he showed improvement in all his measures since his STGs were assessed. 2 out of 6 LTGs were met and 4/6 partially met (improved but not to revised goal level). He is very pleased with progress and agrees he is ready for d/c.     Rehab Potential  Good    Clinical Impairments Affecting Rehab Potential  BP issues; decreased sensation bil LEs    PT Frequency  2x / week    PT Duration  8 weeks    PT Treatment/Interventions  ADLs/Self Care Home Management;Electrical Stimulation;DME Instruction;Gait training;Neuromuscular re-education;Balance training;Therapeutic exercise;Therapeutic activities;Functional mobility training;Patient/family education;Orthotic Fit/Training;Manual techniques;Passive range of motion    PT Next Visit Plan  --    Consulted and Agree with Plan of Care  Patient       Patient will benefit from skilled therapeutic intervention in order to improve the following deficits and impairments:  Abnormal gait, Cardiopulmonary status limiting activity, Decreased activity tolerance, Decreased balance, Decreased mobility, Decreased knowledge of use of DME, Decreased coordination, Decreased range of motion, Decreased strength, Impaired flexibility, Impaired sensation, Postural dysfunction, Pain  Visit Diagnosis: Unsteadiness on feet  Muscle weakness (generalized)  Other abnormalities of gait and mobility     Problem List Patient Active Problem List   Diagnosis Date Noted  . Unsteadiness on feet 11/05/2017  . Spinal stenosis of lumbar region 11/08/2015  . Advance care  planning 11/04/2014  . Leg pain 01/30/2014  . Medicare annual wellness visit, subsequent 11/01/2013  . BPH (benign prostatic hyperplasia)   . Type 2 diabetes mellitus with vascular disease ([place])   . Hyperlipidemia   . Hypertensive heart disease   . CAD (coronary artery disease)    PHYSICAL THERAPY DISCHARGE SUMMARY  Visits from Start of Care: 12  Current functional level related to goals / functional outcomes: See LTGs above. Overall modified independent with cane on level surfaces; supervision on unlevel (due to pt's early discharge--original goals for 12/26/17   Remaining deficits: Neuropathy, back pain both limit his mobility   Education / Equipment: HEP, safe use of SPC   Plan: Patient agrees to discharge.  Patient goals were partially met. Patient is being discharged due to meeting the stated rehab goals.  ?????       Rexanne Mano, PT 12/08/2017, 11:54 AM  [facility] [address] [place], [place], [address] Phone: [phone]   Fax:  [phone]  Name: Paul Terrell MRN: [id number] Date of Birth: 1931/05/20

## 2017-12-11 ENCOUNTER — Encounter: Payer: Medicare Other | Admitting: Physical Therapy

## 2017-12-15 ENCOUNTER — Encounter: Payer: Medicare Other | Admitting: Physical Therapy

## 2017-12-18 ENCOUNTER — Encounter: Payer: Medicare Other | Admitting: Physical Therapy

## 2017-12-22 ENCOUNTER — Encounter: Payer: Medicare Other | Admitting: Physical Therapy

## 2017-12-25 ENCOUNTER — Encounter: Payer: Medicare Other | Admitting: Physical Therapy

## 2017-12-31 ENCOUNTER — Other Ambulatory Visit: Payer: Self-pay | Admitting: Family Medicine

## 2018-01-27 ENCOUNTER — Other Ambulatory Visit: Payer: Self-pay | Admitting: Family Medicine

## 2018-01-27 DIAGNOSIS — E1159 Type 2 diabetes mellitus with other circulatory complications: Secondary | ICD-10-CM

## 2018-02-02 ENCOUNTER — Encounter (INDEPENDENT_AMBULATORY_CARE_PROVIDER_SITE_OTHER): Payer: Self-pay | Admitting: Orthopedic Surgery

## 2018-02-02 ENCOUNTER — Ambulatory Visit (INDEPENDENT_AMBULATORY_CARE_PROVIDER_SITE_OTHER): Payer: Medicare Other | Admitting: Orthopedic Surgery

## 2018-02-02 ENCOUNTER — Ambulatory Visit (INDEPENDENT_AMBULATORY_CARE_PROVIDER_SITE_OTHER): Payer: Medicare Other

## 2018-02-02 DIAGNOSIS — M79671 Pain in right foot: Secondary | ICD-10-CM

## 2018-02-02 DIAGNOSIS — M79672 Pain in left foot: Secondary | ICD-10-CM

## 2018-02-03 ENCOUNTER — Encounter (INDEPENDENT_AMBULATORY_CARE_PROVIDER_SITE_OTHER): Payer: Self-pay | Admitting: Orthopedic Surgery

## 2018-02-03 NOTE — Progress Notes (Signed)
Office Visit Note   Patient: Paul Terrell           Date of Birth: 1931/01/28           MRN: [id number] Visit Date: 02/02/2018 Requested by: Tonia Ghent, MD [address] [place], [place] [address] PCP: Tonia Ghent, MD  Subjective: Chief Complaint  Patient presents with  . Foot Pain    bilateral foot pain with numbness    HPI: Paul Terrell is a patient with bilateral foot pain right worse than left.  He fell about 5 months ago and landed on the concrete floor.  He had some continued pain since the fall.  He does have diabetes and neuropathy in both feet.  He states that both great toes at the MTP joint gets swollen red and this comes and goes.  However, he denies a history of gout.  He states that today he is feeling pretty good.  He ambulates with a cane.  Takes aspirin and Tylenol as needed.              ROS: All systems reviewed are negative as they relate to the chief complaint within the history of present illness.  Patient denies  fevers or chills.   Assessment & Plan: Visit Diagnoses:  1. Bilateral foot pain     Plan: Impression is bilateral foot pain likely from neuropathy.  He may have some episodic gout as well.  Today he is relatively asymptomatic and no intervention is required.  I do think he should come back for repeat evaluation if the MTP joints become red and swollen.  Follow-Up Instructions: Return if symptoms worsen or fail to improve.   Orders:  Orders Placed This Encounter  Procedures  . XR Foot Complete Right  . XR Foot Complete Left   No orders of the defined types were placed in this encounter.     Procedures: No procedures performed   Clinical Data: No additional findings.  Objective: Vital Signs: There were no vitals taken for this visit.  Physical Exam:   Constitutional: Patient appears well-developed HEENT:  Head: Normocephalic Eyes:EOM are normal Neck: Normal range of motion Cardiovascular: Normal rate Pulmonary/chest:  Effort normal Neurologic: Patient is alert Skin: Skin is warm Psychiatric: Patient has normal mood and affect    Ortho Exam: Ortho exam demonstrates full active and passive range of motion of the ankles.  Patient has palpable intact nontender anterior to posterior to peroneal and Achilles tendons.  Pedal pulses trace palpable.  He does have slightly diminished passive range of motion first MTP joint on the right compared to the left.  No redness warmth induration or erythema in any part of the foot is present.  Sensation is diminished on the dorsal and plantar aspect of both feet consistent with diabetic neuropathy  Specialty Comments:  No specialty comments available.  Imaging: No results found.   PMFS History: Patient Active Problem List   Diagnosis Date Noted  . Unsteadiness on feet 11/05/2017  . Spinal stenosis of lumbar region 11/08/2015  . Advance care planning 11/04/2014  . Leg pain 01/30/2014  . Medicare annual wellness visit, subsequent 11/01/2013  . BPH (benign prostatic hyperplasia)   . Type 2 diabetes mellitus with vascular disease ([place])   . Hyperlipidemia   . Hypertensive heart disease   . CAD (coronary artery disease)    Past Medical History:  Diagnosis Date  . Arthritis   . Basal cell carcinoma of scalp 12/16/07  Reexcision (Dr. Artist Beach)  . BPH (benign prostatic hyperplasia)   . CAD (coronary artery disease) 11/01   stent placed  . Cataract   . Diabetes mellitus type II 11/01  . GERD (gastroesophageal reflux disease)    occasionally  . Hearing aid worn   . Hyperlipemia 11/01  . Hypertension 11/01  . Ruptured disc, cervical    Neck C7 (Dr. Pearlie Oyster)    Family History  Problem Relation Age of Onset  . Stroke Mother        Lived 2 years  . Heart disease Mother        CAD, Angioplasty X 2  . Hypertension Mother   . Heart disease Father        MI  . Hypertension Father   . Hypertension Sister   . Hypertension Brother   . Heart disease  Brother        MI  . Hyperlipidemia Brother   . Heart disease Brother        MI    Past Surgical History:  Procedure Laterality Date  . BASAL CELL CARCINOMA EXCISION    . BUNIONECTOMY  02/2000   Dr. Janus Molder - Right  . CERVICAL FUSION    . COLONOSCOPY     2012  . KNEE ARTHROSCOPY  09/1999   Right  . LACERATION REPAIR  09/1999   Right Hand (Dr. Fredna Dow)  . LITHOTRIPSY  1991   Dr. Elyse Jarvis  . ROTATOR CUFF REPAIR  02/05   Left   Social History   Occupational History  . Occupation: Carpentry  Tobacco Use  . Smoking status: Former Smoker    Packs/day: 1.00    Years: 25.00    Pack years: 25.00    Types: Cigarettes    Last attempt to quit: 01/09/1976    Years since quitting: 42.0  . Smokeless tobacco: Current User    Types: Chew  . Tobacco comment: quit smoking about 25 years ago  Substance and Sexual Activity  . Alcohol use: No  . Drug use: No  . Sexual activity: Never

## 2018-02-04 ENCOUNTER — Other Ambulatory Visit (INDEPENDENT_AMBULATORY_CARE_PROVIDER_SITE_OTHER): Payer: Medicare Other

## 2018-02-04 DIAGNOSIS — E1159 Type 2 diabetes mellitus with other circulatory complications: Secondary | ICD-10-CM | POA: Diagnosis not present

## 2018-02-04 LAB — POCT GLYCOSYLATED HEMOGLOBIN (HGB A1C): Hemoglobin A1C: 7.2

## 2018-02-10 ENCOUNTER — Other Ambulatory Visit: Payer: Self-pay | Admitting: Family Medicine

## 2018-02-10 ENCOUNTER — Encounter: Payer: Self-pay | Admitting: Family Medicine

## 2018-02-10 ENCOUNTER — Ambulatory Visit (INDEPENDENT_AMBULATORY_CARE_PROVIDER_SITE_OTHER): Payer: Medicare Other | Admitting: Family Medicine

## 2018-02-10 DIAGNOSIS — E1159 Type 2 diabetes mellitus with other circulatory complications: Secondary | ICD-10-CM | POA: Diagnosis not present

## 2018-02-10 MED ORDER — GLYBURIDE 5 MG PO TABS: ORAL_TABLET | ORAL | Status: DC

## 2018-02-10 NOTE — Progress Notes (Signed)
Diabetes:  Using medications without difficulties:yes Hypoglycemic episodes:no Hyperglycemic episodes:no Feet problems: see below Blood Sugars averaging: ~150 eye exam within last year: A1c was down to 7.2.  He has been sticking with his diet more.    We talked about tapering his vitamin supplement list just so he would be taking fewer pills per day.  We should be able to do this without negatively affecting his health.  Discussed. See med list.   He is using cane in the meantime with walking and that makes him feel more secure.  He has gait changes at baseline and he is trying to compensate.  D/w pt about cautions, ie fall cautions.  He is still doing therapy exercises for fall prevention.  He isn't dizzy.    Meds, vitals, and allergies reviewed.   ROS: Per HPI unless specifically indicated in ROS section   GEN: nad, alert and oriented HEENT: mucous membranes moist NECK: supple w/o LA CV: rrr. PULM: ctab, no inc wob ABD: soft, +bs EXT: no edema SKIN: no acute rash

## 2018-02-10 NOTE — Patient Instructions (Addendum)
Stop the vitamin E and coQ10.  Cut the glyburide back to 1 pill a day . Recheck in about 3 months.  The only lab you need to have done for your next diabetic visit is an A1c.  We can do this with a fingerstick test at the office visit.  You do not need a lab visit ahead of time for this.  It does not matter if you are fasting when the lab is done.   Take care.  Glad to see you.

## 2018-02-11 NOTE — Assessment & Plan Note (Addendum)
A1c down to 7.2.  He has been sticking with his diet more.   Cut the glyburide back to 1 pill a day .  Rationale discussed with patient.  Routine cautions given to patient. Recheck in about 3 months.  Update me as needed in the meantime.  He agrees.

## 2018-03-02 ENCOUNTER — Other Ambulatory Visit: Payer: Self-pay | Admitting: Family Medicine

## 2018-03-23 ENCOUNTER — Other Ambulatory Visit: Payer: Self-pay | Admitting: Family Medicine

## 2018-04-07 ENCOUNTER — Other Ambulatory Visit: Payer: Self-pay | Admitting: Family Medicine

## 2018-04-14 ENCOUNTER — Other Ambulatory Visit: Payer: Self-pay | Admitting: Family Medicine

## 2018-05-01 ENCOUNTER — Other Ambulatory Visit: Payer: Self-pay | Admitting: Podiatry

## 2018-05-01 ENCOUNTER — Ambulatory Visit (INDEPENDENT_AMBULATORY_CARE_PROVIDER_SITE_OTHER): Payer: Medicare Other | Admitting: Podiatry

## 2018-05-01 ENCOUNTER — Encounter: Payer: Self-pay | Admitting: Podiatry

## 2018-05-01 ENCOUNTER — Ambulatory Visit (INDEPENDENT_AMBULATORY_CARE_PROVIDER_SITE_OTHER): Payer: Medicare Other

## 2018-05-01 VITALS — BP 162/65 | HR 70 | Resp 16

## 2018-05-01 DIAGNOSIS — S99921A Unspecified injury of right foot, initial encounter: Secondary | ICD-10-CM | POA: Diagnosis not present

## 2018-05-01 DIAGNOSIS — M779 Enthesopathy, unspecified: Secondary | ICD-10-CM | POA: Diagnosis not present

## 2018-05-01 DIAGNOSIS — M2011 Hallux valgus (acquired), right foot: Secondary | ICD-10-CM

## 2018-05-01 DIAGNOSIS — M79672 Pain in left foot: Secondary | ICD-10-CM | POA: Diagnosis not present

## 2018-05-01 DIAGNOSIS — M21619 Bunion of unspecified foot: Secondary | ICD-10-CM | POA: Diagnosis not present

## 2018-05-01 MED ORDER — TRIAMCINOLONE ACETONIDE 10 MG/ML IJ SUSP
10.0000 mg | Freq: Once | INTRAMUSCULAR | Status: AC
  Administered 2018-05-01: 10 mg

## 2018-05-01 NOTE — Progress Notes (Signed)
   Subjective:    Patient ID: Paul Terrell, male    DOB: Nov 25, 1930, 84 y.o.   MRN: [id number]  HPI    Review of Systems  All other systems reviewed and are negative.      Objective:   Physical Exam        Assessment & Plan:

## 2018-05-02 NOTE — Progress Notes (Signed)
Subjective:   Patient ID: Paul Terrell, male   DOB: 84 y.o.   MRN: [id number]   HPI Patient presents stating of been having trouble with my big toe joint right and do not remember specific injury and I do have diabetes that is under reasonable control and I did have bunion surgery patient did have an injury where he fell and he is not completely lucid currently.  Patient does not smoke and likes to be active   Review of Systems  All other systems reviewed and are negative.       Objective:  Physical Exam  Constitutional: He appears well-developed and well-nourished.  Cardiovascular: Intact distal pulses.  Pulmonary/Chest: Effort normal.  Musculoskeletal: Normal range of motion.  Neurological: He is alert.  Skin: Skin is warm.  Nursing note and vitals reviewed.   Neurovascular status found to be intact muscle strength was mildly reduced along with range of motion with patient found to have inflammation pain of the first MPJ right over left and trauma of both feet.  He does have a bruise on his head and did have some trauma from the fall he experienced and he does have good digital perfusion     Assessment:  Inflammatory capsulitis first MPJ right with moderate structural deformity     Plan:  H&P x-rays reviewed and today had a careful injection of the first MPJ 3 Milgram Kenalog 5 Milgram Xylocaine advised on supportive shoes and will be seen back to reevaluate  X-ray indicates previous bunion surgery right with fixation in place and both feet exhibiting moderate arthritis around the big toe joint

## 2018-05-06 ENCOUNTER — Telehealth: Payer: Self-pay | Admitting: Family Medicine

## 2018-05-06 NOTE — Telephone Encounter (Signed)
-----   Message from Wallene Huh, DPM sent at 05/05/2018  1:10 PM EDT -----  I just had difficulty getting information about his condition. I think he is fine ----- Message ----- From: Tonia Ghent, MD Sent: 05/03/2018  11:02 AM EDT To: Wallene Huh, DPM  What is his mental status and situation?  I saw your note that "he is not completely lucid currently".  Thanks.  Brigitte Pulse

## 2018-05-06 NOTE — Telephone Encounter (Signed)
Noted. Thanks.

## 2018-05-14 ENCOUNTER — Ambulatory Visit (INDEPENDENT_AMBULATORY_CARE_PROVIDER_SITE_OTHER): Payer: Medicare Other | Admitting: Family Medicine

## 2018-05-14 ENCOUNTER — Encounter: Payer: Self-pay | Admitting: Family Medicine

## 2018-05-14 ENCOUNTER — Observation Stay (HOSPITAL_COMMUNITY)
Admission: EM | Admit: 2018-05-14 | Discharge: 2018-05-17 | Disposition: A | Discharge status: Home / Self Care | Payer: Medicare Other | Attending: Internal Medicine | Admitting: Internal Medicine

## 2018-05-14 ENCOUNTER — Other Ambulatory Visit: Payer: Self-pay

## 2018-05-14 ENCOUNTER — Telehealth: Payer: Self-pay | Admitting: Family Medicine

## 2018-05-14 ENCOUNTER — Encounter (HOSPITAL_COMMUNITY): Payer: Self-pay

## 2018-05-14 VITALS — BP 142/56 | HR 80 | Temp 98.5°F | Ht 66.0 in | Wt 159.8 lb

## 2018-05-14 DIAGNOSIS — R42 Dizziness and giddiness: Secondary | ICD-10-CM | POA: Diagnosis not present

## 2018-05-14 DIAGNOSIS — M1990 Unspecified osteoarthritis, unspecified site: Secondary | ICD-10-CM | POA: Diagnosis not present

## 2018-05-14 DIAGNOSIS — I10 Essential (primary) hypertension: Secondary | ICD-10-CM | POA: Diagnosis not present

## 2018-05-14 DIAGNOSIS — K644 Residual hemorrhoidal skin tags: Secondary | ICD-10-CM | POA: Diagnosis not present

## 2018-05-14 DIAGNOSIS — K5730 Diverticulosis of large intestine without perforation or abscess without bleeding: Secondary | ICD-10-CM | POA: Insufficient documentation

## 2018-05-14 DIAGNOSIS — K921 Melena: Principal | ICD-10-CM | POA: Insufficient documentation

## 2018-05-14 DIAGNOSIS — K219 Gastro-esophageal reflux disease without esophagitis: Secondary | ICD-10-CM | POA: Diagnosis not present

## 2018-05-14 DIAGNOSIS — E1142 Type 2 diabetes mellitus with diabetic polyneuropathy: Secondary | ICD-10-CM | POA: Insufficient documentation

## 2018-05-14 DIAGNOSIS — R809 Proteinuria, unspecified: Secondary | ICD-10-CM

## 2018-05-14 DIAGNOSIS — Z7984 Long term (current) use of oral hypoglycemic drugs: Secondary | ICD-10-CM | POA: Diagnosis not present

## 2018-05-14 DIAGNOSIS — H9190 Unspecified hearing loss, unspecified ear: Secondary | ICD-10-CM | POA: Diagnosis not present

## 2018-05-14 DIAGNOSIS — K640 First degree hemorrhoids: Secondary | ICD-10-CM | POA: Insufficient documentation

## 2018-05-14 DIAGNOSIS — Z886 Allergy status to analgesic agent status: Secondary | ICD-10-CM | POA: Diagnosis not present

## 2018-05-14 DIAGNOSIS — K31819 Angiodysplasia of stomach and duodenum without bleeding: Secondary | ICD-10-CM | POA: Insufficient documentation

## 2018-05-14 DIAGNOSIS — D649 Anemia, unspecified: Secondary | ICD-10-CM | POA: Diagnosis present

## 2018-05-14 DIAGNOSIS — E1159 Type 2 diabetes mellitus with other circulatory complications: Secondary | ICD-10-CM | POA: Diagnosis not present

## 2018-05-14 DIAGNOSIS — D509 Iron deficiency anemia, unspecified: Secondary | ICD-10-CM | POA: Diagnosis not present

## 2018-05-14 DIAGNOSIS — E1129 Type 2 diabetes mellitus with other diabetic kidney complication: Secondary | ICD-10-CM

## 2018-05-14 DIAGNOSIS — Z7982 Long term (current) use of aspirin: Secondary | ICD-10-CM | POA: Diagnosis not present

## 2018-05-14 DIAGNOSIS — R5383 Other fatigue: Secondary | ICD-10-CM | POA: Diagnosis not present

## 2018-05-14 DIAGNOSIS — Z79899 Other long term (current) drug therapy: Secondary | ICD-10-CM | POA: Insufficient documentation

## 2018-05-14 DIAGNOSIS — N400 Enlarged prostate without lower urinary tract symptoms: Secondary | ICD-10-CM | POA: Insufficient documentation

## 2018-05-14 DIAGNOSIS — Z85828 Personal history of other malignant neoplasm of skin: Secondary | ICD-10-CM | POA: Diagnosis not present

## 2018-05-14 DIAGNOSIS — I2510 Atherosclerotic heart disease of native coronary artery without angina pectoris: Secondary | ICD-10-CM | POA: Insufficient documentation

## 2018-05-14 DIAGNOSIS — E785 Hyperlipidemia, unspecified: Secondary | ICD-10-CM | POA: Insufficient documentation

## 2018-05-14 DIAGNOSIS — Z981 Arthrodesis status: Secondary | ICD-10-CM | POA: Insufficient documentation

## 2018-05-14 DIAGNOSIS — Z87891 Personal history of nicotine dependence: Secondary | ICD-10-CM | POA: Insufficient documentation

## 2018-05-14 DIAGNOSIS — E1151 Type 2 diabetes mellitus with diabetic peripheral angiopathy without gangrene: Secondary | ICD-10-CM | POA: Insufficient documentation

## 2018-05-14 DIAGNOSIS — D122 Benign neoplasm of ascending colon: Secondary | ICD-10-CM | POA: Insufficient documentation

## 2018-05-14 DIAGNOSIS — B3781 Candidal esophagitis: Secondary | ICD-10-CM | POA: Insufficient documentation

## 2018-05-14 DIAGNOSIS — Z888 Allergy status to other drugs, medicaments and biological substances status: Secondary | ICD-10-CM | POA: Insufficient documentation

## 2018-05-14 DIAGNOSIS — E119 Type 2 diabetes mellitus without complications: Secondary | ICD-10-CM | POA: Diagnosis present

## 2018-05-14 DIAGNOSIS — Z955 Presence of coronary angioplasty implant and graft: Secondary | ICD-10-CM | POA: Insufficient documentation

## 2018-05-14 DIAGNOSIS — Z8249 Family history of ischemic heart disease and other diseases of the circulatory system: Secondary | ICD-10-CM | POA: Insufficient documentation

## 2018-05-14 LAB — CBC WITH DIFFERENTIAL/PLATELET
Basophils Absolute: 0.1 10*3/uL (ref 0.0–0.1)
Basophils Relative: 1.2 % (ref 0.0–3.0)
Eosinophils Absolute: 0.1 10*3/uL (ref 0.0–0.7)
Eosinophils Relative: 1.6 % (ref 0.0–5.0)
HCT: 25.1 % — ABNORMAL LOW (ref 39.0–52.0)
Hemoglobin: 7.7 g/dL — CL (ref 13.0–17.0)
Lymphocytes Relative: 26.7 % (ref 12.0–46.0)
Lymphs Abs: 1.5 10*3/uL (ref 0.7–4.0)
MCHC: 30.8 g/dL (ref 30.0–36.0)
MCV: 66.1 fl — ABNORMAL LOW (ref 78.0–100.0)
Monocytes Absolute: 0.6 10*3/uL (ref 0.1–1.0)
Monocytes Relative: 10.7 % (ref 3.0–12.0)
Neutro Abs: 3.3 10*3/uL (ref 1.4–7.7)
Neutrophils Relative %: 59.8 % (ref 43.0–77.0)
Platelets: 178 10*3/uL (ref 150.0–400.0)
RBC: 3.81 Mil/uL — ABNORMAL LOW (ref 4.22–5.81)
RDW: 18.6 % — ABNORMAL HIGH (ref 11.5–15.5)
WBC: 5.5 10*3/uL (ref 4.0–10.5)

## 2018-05-14 LAB — COMPREHENSIVE METABOLIC PANEL
ALT: 24 U/L (ref 0–44)
AST: 27 U/L (ref 15–41)
Albumin: 3.7 g/dL (ref 3.5–5.0)
Alkaline Phosphatase: 50 U/L (ref 38–126)
Anion gap: 6 (ref 5–15)
BUN: 13 mg/dL (ref 8–23)
CO2: 26 mmol/L (ref 22–32)
Calcium: 9.1 mg/dL (ref 8.9–10.3)
Chloride: 105 mmol/L (ref 98–111)
Creatinine, Ser: 0.92 mg/dL (ref 0.61–1.24)
GFR calc Af Amer: >60 mL/min (ref >60)
GFR calc non Af Amer: >60 mL/min (ref >60)
Glucose, Bld: 215 mg/dL — ABNORMAL HIGH (ref 70–99)
Potassium: 4.3 mmol/L (ref 3.5–5.1)
Sodium: 137 mmol/L (ref 135–145)
Total Bilirubin: 0.6 mg/dL (ref 0.3–1.2)
Total Protein: 6.2 g/dL — ABNORMAL LOW (ref 6.5–8.1)

## 2018-05-14 LAB — POCT GLYCOSYLATED HEMOGLOBIN (HGB A1C): Hemoglobin A1C: 6.9 % — AB (ref 4.0–5.6)

## 2018-05-14 LAB — CBC
HCT: 27.9 % — ABNORMAL LOW (ref 39.0–52.0)
Hemoglobin: 7.6 g/dL — ABNORMAL LOW (ref 13.0–17.0)
MCH: 19.9 pg — ABNORMAL LOW (ref 26.0–34.0)
MCHC: 27.2 g/dL — ABNORMAL LOW (ref 30.0–36.0)
MCV: 73.2 fL — ABNORMAL LOW (ref 78.0–100.0)
Platelets: 214 10*3/uL (ref 150–400)
RBC: 3.81 MIL/uL — ABNORMAL LOW (ref 4.22–5.81)
RDW: 18.1 % — ABNORMAL HIGH (ref 11.5–15.5)
WBC: 5.5 10*3/uL (ref 4.0–10.5)

## 2018-05-14 LAB — CBG MONITORING, ED: Glucose-Capillary: 120 mg/dL — ABNORMAL HIGH (ref 70–99)

## 2018-05-14 LAB — ABO/RH: ABO/RH(D): O POS

## 2018-05-14 LAB — PREPARE RBC (CROSSMATCH)

## 2018-05-14 LAB — POC OCCULT BLOOD, ED: Fecal Occult Bld: NEGATIVE

## 2018-05-14 MED ORDER — HYDRALAZINE HCL 20 MG/ML IJ SOLN
10.0000 mg | INTRAMUSCULAR | Status: DC | PRN
  Administered 2018-05-17: 10 mg via INTRAVENOUS

## 2018-05-14 MED ORDER — ACETAMINOPHEN 650 MG RE SUPP: 650.0000 mg | Freq: Four times a day (QID) | RECTAL | Status: DC | PRN

## 2018-05-14 MED ORDER — PANTOPRAZOLE SODIUM 40 MG PO TBEC
40.0000 mg | DELAYED_RELEASE_TABLET | Freq: Every day | ORAL | Status: DC
  Administered 2018-05-15 – 2018-05-17 (×3): 40 mg via ORAL
  Filled 2018-05-14 (×3): qty 1

## 2018-05-14 MED ORDER — ONDANSETRON HCL 4 MG PO TABS: 4.0000 mg | ORAL_TABLET | Freq: Four times a day (QID) | ORAL | Status: DC | PRN

## 2018-05-14 MED ORDER — INSULIN ASPART 100 UNIT/ML ~~LOC~~ SOLN
0.0000 [IU] | Freq: Three times a day (TID) | SUBCUTANEOUS | Status: DC
  Administered 2018-05-15: 2 [IU] via SUBCUTANEOUS
  Administered 2018-05-16: 1 [IU] via SUBCUTANEOUS
  Administered 2018-05-16: 2 [IU] via SUBCUTANEOUS
  Administered 2018-05-17: 1 [IU] via SUBCUTANEOUS

## 2018-05-14 MED ORDER — ACETAMINOPHEN 325 MG PO TABS: 650.0000 mg | ORAL_TABLET | Freq: Four times a day (QID) | ORAL | Status: DC | PRN

## 2018-05-14 MED ORDER — INSULIN ASPART 100 UNIT/ML ~~LOC~~ SOLN: 0.0000 [IU] | Freq: Every day | SUBCUTANEOUS | Status: DC

## 2018-05-14 MED ORDER — FINASTERIDE 5 MG PO TABS
5.0000 mg | ORAL_TABLET | Freq: Every day | ORAL | Status: DC
  Administered 2018-05-14 – 2018-05-16 (×3): 5 mg via ORAL
  Filled 2018-05-14 (×3): qty 1

## 2018-05-14 MED ORDER — TAMSULOSIN HCL 0.4 MG PO CAPS
0.4000 mg | ORAL_CAPSULE | Freq: Every day | ORAL | Status: DC
  Administered 2018-05-14 – 2018-05-16 (×3): 0.4 mg via ORAL
  Filled 2018-05-14 (×3): qty 1

## 2018-05-14 MED ORDER — ALBUTEROL SULFATE (2.5 MG/3ML) 0.083% IN NEBU: 2.5000 mg | INHALATION_SOLUTION | Freq: Four times a day (QID) | RESPIRATORY_TRACT | Status: DC | PRN

## 2018-05-14 MED ORDER — METOPROLOL SUCCINATE ER 50 MG PO TB24
50.0000 mg | ORAL_TABLET | Freq: Every day | ORAL | Status: DC
  Administered 2018-05-16 – 2018-05-17 (×2): 50 mg via ORAL
  Filled 2018-05-14 (×3): qty 1

## 2018-05-14 MED ORDER — ONDANSETRON HCL 4 MG/2ML IJ SOLN: 4.0000 mg | Freq: Four times a day (QID) | INTRAMUSCULAR | Status: DC | PRN

## 2018-05-14 MED ORDER — SODIUM CHLORIDE 0.9 % IV SOLN
10.0000 mL/h | Freq: Once | INTRAVENOUS | Status: AC
  Administered 2018-05-14: 10 mL/h via INTRAVENOUS

## 2018-05-14 MED ORDER — GLYBURIDE 5 MG PO TABS: ORAL_TABLET | ORAL | Status: DC

## 2018-05-14 MED ORDER — PRAVASTATIN SODIUM 20 MG PO TABS
20.0000 mg | ORAL_TABLET | Freq: Every day | ORAL | Status: DC
  Administered 2018-05-14 – 2018-05-16 (×3): 20 mg via ORAL
  Filled 2018-05-14 (×3): qty 1

## 2018-05-14 MED ORDER — LISINOPRIL 2.5 MG PO TABS
2.5000 mg | ORAL_TABLET | Freq: Every day | ORAL | Status: DC
  Administered 2018-05-14: 2.5 mg via ORAL
  Filled 2018-05-14: qty 1

## 2018-05-14 NOTE — ED Provider Notes (Signed)
Patient placed in Quick Look pathway, seen and evaluated   Chief Complaint: Abnormal labs, sent from PCP  HPI:   Patient wonders doctor today, was told to come to the emergency room due to low blood counts.  Per chart review, hemoglobin at 7.7, baseline 14.  Patient reports over the past 6 to 8 months, he has been having intermittent black stools.  He reports increased weakness over the past several days.  He reports dizziness when going from sitting to standing.  He denies chest pain, nausea, vomiting.  He reports intermittent lower abdominal pain, is not shortsighted is on.  ROS: Weakness, anemia  Physical Exam:   Gen: No distress  Neuro: Awake and Alert  Skin: Warm, minimal pallor noted of the face, pulses intact.  Cv: Regular rate rhythm  Pulm: Clear lung sounds  Abdomen: Soft without rigidity, guarding, distention.  No tenderness palpation.   Pt stable, however with critically low hgb at dr's office today (seen in EMR). Charge RN informed pt will need a room asap.   Initiation of care has begun. The patient has been counseled on the process, plan, and necessity for staying for the completion/evaluation, and the remainder of the medical screening examination    Franchot Heidelberg, PA-C 05/14/18 1428    Long, Wonda [facility], MD 05/14/18 2018

## 2018-05-14 NOTE — ED Triage Notes (Signed)
Pt sent by pcp after having routine lab work and told his hgb was low. Pt has no complaints but states "I have abd pain occasionally" VSS

## 2018-05-14 NOTE — Patient Instructions (Addendum)
Your sugar average is lower than previous so I want to recheck your blood counts today to make sure that isn't falsely affecting your readings.  Don't change your meds for now.  Go to the lab on the way out.  We'll contact you with your lab report. Take care.  Glad to see you.  Plan on recheck in about 3 months with a yearly physical with [name] and [place].

## 2018-05-14 NOTE — ED Notes (Signed)
Pharmacy tech at bedside 

## 2018-05-14 NOTE — H&P (Addendum)
History and Physical    [name] GBT:[id number] DOB: Dec 21, 1930 DOA: 05/14/2018  Referring MD/NP/PA: Reece Agar, PA-C PCP: Tonia Ghent, MD  Patient coming from: Nursing facility via EMS as a direct admit  Chief Complaint: Abnormal lab work  I have personally briefly reviewed patient's old medical records in [facility]   HPI: Paul Terrell is a 84 y.o. male with medical history significant of HTN, HLD, CAD s/p PCI, GERD, and BPH; who presents after routine lab work was performed at his primary care's office.  Patient and family help provide history and any inconsistencies may be related to patient being hard of hearing.  Labs revealed a hemoglobin of 7.7 and therefore patient was called and advised to come into the hospital for further evaluation.  Patient reports symptoms started approximately 6 months ago after having a fall where he injured his back and hips. Since that time he reports things going downhill.  He complains of feeling tired and fatigued.  Over the last 2 months, he has had intermittent episodes of upset stomach.  He describes it as a crampy feeling where he usually has to use the restroom and has a loose stool.  Symptoms normally happens at least once per week.  He also complains of intermittent dark stools.  The last time that he had upset stomach was early last week.  Patient's last colonoscopy was approximately 7 years ago with Dr. Ardis Hughs of [facility], which showed diverticulosis at that time without polyps.  Associated symptoms include chills, chronic back and joint pain. He denies having any lightheadedness, chest pain, dysuria, recent falls, shortness of breath, nausea, or vomiting.  ED Course: On admission into the emergency department patient was noted to be afebrile, blood pressures elevated up to 180/62, and all other vitals maintained.  Labs revealed hemoglobin of 7.6 confirmed on repeat with glucose 215.  Stool guaiac was negative for blood. Patient was  typed and screened in order to be transfused 1 unit of blood. Dr. Ardis Hughs of gastroenterology was consulted, and will see patient in consultation.  TRH called to admit.  Review of Systems  Constitutional: Positive for chills and malaise/fatigue. Negative for fever and weight loss.  HENT: Positive for hearing loss. Negative for nosebleeds.   Eyes: Negative for photophobia and pain.  Respiratory: Negative for cough and shortness of breath.   Cardiovascular: Negative for chest pain and palpitations.  Gastrointestinal: Positive for abdominal pain and diarrhea. Negative for nausea and vomiting.  Genitourinary: Negative for dysuria and hematuria.  Musculoskeletal: Positive for back pain and joint pain. Negative for falls.  Skin: Negative for itching and rash.  Neurological: Negative for dizziness, focal weakness and loss of consciousness.  Psychiatric/Behavioral: Positive for memory loss (Mild). Negative for substance abuse.    Past Medical History:  Diagnosis Date  . Arthritis   . Basal cell carcinoma of scalp 12/16/07   Reexcision (Dr. Merril Abbe. Teressa Senter)  . BPH (benign prostatic hyperplasia)   . CAD (coronary artery disease) 11/01   stent placed  . Cataract   . Diabetes mellitus type II 11/01  . GERD (gastroesophageal reflux disease)    occasionally  . Hearing aid worn   . Hyperlipemia 11/01  . Hypertension 11/01  . Ruptured disc, cervical    Neck C7 (Dr. Pearlie Oyster)    Past Surgical History:  Procedure Laterality Date  . BASAL CELL CARCINOMA EXCISION    . BUNIONECTOMY  02/2000   Dr. Janus Molder - Right  . CERVICAL FUSION    .  COLONOSCOPY     2012  . KNEE ARTHROSCOPY  09/1999   Right  . LACERATION REPAIR  09/1999   Right Hand (Dr. Fredna Dow)  . LITHOTRIPSY  1991   Dr. Elyse Jarvis  . ROTATOR CUFF REPAIR  02/05   Left     reports that he quit smoking about 42 years ago. His smoking use included cigarettes. He has a 25.00 pack-year smoking history. His smokeless tobacco use  includes chew. He reports that he does not drink alcohol or use drugs.  Allergies  Allergen Reactions  . Simvastatin     myalgias    Family History  Problem Relation Age of Onset  . Stroke Mother        Lived 2 years  . Heart disease Mother        CAD, Angioplasty X 2  . Hypertension Mother   . Heart disease Father        MI  . Hypertension Father   . Hypertension Sister   . Hypertension Brother   . Heart disease Brother        MI  . Hyperlipidemia Brother   . Heart disease Brother        MI    Prior to Admission medications   Medication Sig Start Date End Date Taking? Authorizing Provider  aspirin 81 MG tablet Take 81 mg by mouth daily.      [provider]  Cholecalciferol (VITAMIN D-3) 5000 UNITS TABS Take by mouth daily.    [provider]  finasteride (PROSCAR) 5 MG tablet TAKE 1 TABLET (5 MG TOTAL) BY MOUTH DAILY. 04/14/18   Tonia Ghent, MD  glyBURIDE (DIABETA) 5 MG tablet TAKE 1 TABLET EVERY MORNING. 05/14/18   Tonia Ghent, MD  Lancets [facility] ULTRASOFT) lancets Test once daily Dx 250.00     [provider]  lisinopril (ZESTRIL) 2.5 MG tablet Take 1 tablet (2.5 mg total) by mouth daily. 11/11/17   Tonia Ghent, MD  metFORMIN (GLUCOPHAGE) 1000 MG tablet TAKE 1 TABLET TWICE DAILY 03/24/18   Tonia Ghent, MD  metoprolol succinate (TOPROL-XL) 50 MG 24 hr tablet TAKE 1 TABLET EVERY DAY  WITH  OR  FOLLOWING  A  MEAL 02/11/18   Tonia Ghent, MD  Multiple Vitamin (MULTIVITAMIN) tablet Take 1 tablet by mouth daily.      [provider]  Omega-3 Fatty Acids (FISH OIL) 1000 MG CAPS Take by mouth daily.      [provider]  ONE TOUCH ULTRA TEST test strip TEST BLOOD SUGAR ONCE DAILY. DX: 250.00 06/15/14   Tonia Ghent, MD  pravastatin (PRAVACHOL) 20 MG tablet TAKE 1 TABLET EVERY DAY 01/01/18   Tonia Ghent, MD  tamsulosin (FLOMAX) 0.4 MG CAPS capsule TAKE 1 CAPSULE (0.4 MG TOTAL) BY MOUTH DAILY. 03/03/18    Tonia Ghent, MD  vitamin B-12 (CYANOCOBALAMIN) 250 MCG tablet Take 250 mcg by mouth 2 (two) times daily.     [provider]    Physical Exam:  Constitutional: Elderly male in NAD, calm, comfortable Vitals:   05/14/18 1657 05/14/18 1807 05/14/18 1811 05/14/18 1830  BP: (!) 183/62 (!) 180/70 (!) 180/70 (!) 180/62  Pulse: (!) 59 (!) 51 (!) 51 65  Resp: 17  16 16   Temp:   97.7 F (36.5 C) 97.8 F (36.6 C)  TempSrc:   Oral Oral  SpO2: 100% 98%     Eyes: PERRL, lids and conjunctivae normal ENMT: Mucous membranes are  moist. Posterior pharynx clear of any exudate or lesions. Hard of hearing. Neck: normal, supple, no masses, no thyromegaly. Respiratory: clear to auscultation bilaterally, no wheezing, no crackles. Normal respiratory effort. No accessory muscle use.  Cardiovascular: Bradycardic, no murmurs / rubs / gallops.  Trace lower extremity edema. 2+ pedal pulses. No carotid bruits.  Abdomen: no tenderness, no masses palpated. No hepatosplenomegaly. Bowel sounds positive.  Musculoskeletal: no clubbing / cyanosis.  Able to move all extremities.. Normal muscle tone.  Skin: Pallor.  No rashes, lesions, ulcers. No induration Neurologic: CN 2-12 grossly intact. Sensation intact, DTR normal. Strength 5/5 in all 4.  Psychiatric: Normal judgment and insight. Alert and oriented x 3. Normal mood.     Labs on Admission: I have personally reviewed following labs and imaging studies  CBC: Recent Labs  Lab 05/14/18 1005 05/14/18 1424  WBC 5.5 5.5  NEUTROABS 3.3  --   HGB 7.7 Repeated and verified X2.* 7.6*  HCT 25.1 Repeated and verified X2.* 27.9*  MCV 66.1 Repeated and verified X2.* 73.2*  PLT 178.0 440   Basic Metabolic Panel: Recent Labs  Lab 05/14/18 1424  NA 137  K 4.3  CL 105  CO2 26  GLUCOSE 215*  BUN 13  CREATININE 0.92  CALCIUM 9.1   GFR: Estimated Creatinine Clearance: 53 mL/min (by C-G formula based on SCr of 0.92 mg/dL). Liver Function  Tests: Recent Labs  Lab 05/14/18 1424  AST 27  ALT 24  ALKPHOS 50  BILITOT 0.6  PROT 6.2*  ALBUMIN 3.7   No results for input(s): LIPASE, AMYLASE in the last 168 hours. No results for input(s): AMMONIA in the last 168 hours. Coagulation Profile: No results for input(s): INR, PROTIME in the last 168 hours. Cardiac Enzymes: No results for input(s): CKTOTAL, CKMB, CKMBINDEX, TROPONINI in the last 168 hours. BNP (last 3 results) No results for input(s): PROBNP in the last 8760 hours. HbA1C: Recent Labs    05/14/18 0945  HGBA1C 6.9*   CBG: No results for input(s): GLUCAP in the last 168 hours. Lipid Profile: No results for input(s): CHOL, HDL, LDLCALC, TRIG, CHOLHDL, LDLDIRECT in the last 72 hours. Thyroid Function Tests: No results for input(s): TSH, T4TOTAL, FREET4, T3FREE, THYROIDAB in the last 72 hours. Anemia Panel: No results for input(s): VITAMINB12, FOLATE, FERRITIN, TIBC, IRON, RETICCTPCT in the last 72 hours. Urine analysis: No results found for: COLORURINE, APPEARANCEUR, LABSPEC, PHURINE, GLUCOSEU, HGBUR, BILIRUBINUR, KETONESUR, PROTEINUR, UROBILINOGEN, NITRITE, LEUKOCYTESUR Sepsis Labs: No results found for this or any previous visit (from the past 240 hour(s)).   Radiological Exams on Admission: No results found.  EKG: Independently reviewed.  Sinus rhythm at 53 bpm  Assessment/Plan Symptomatic anemia, iron deficiency: Acute.  Patient found to have hemoglobin of 7.7 on admission.  Patient reports intermittent melena.  Stool guaiacs were noted to be negative for blood.  Last colonoscopy 7 years ago with Dr. Ardis Hughs revealed diverticulosis of the sigmoid and descending colon without polyps. - Admit to a telemetry bed - Liquid diet overnight - Unable to check anemia panel as blood transfusion started - Monitor intake and output - Transfuse 1 unit of PRBC - Appreciate GI consultative services, will follow-up for further recommendations  Abdominal discomfort:  Patient reports intermittent abdominal discomfort with loose stools.  Unclear cause of symptoms at this time, but could be related to gastritis, acid reflux, or other. - Protonix  Diabetes mellitus type 2: On admission initial glucose 215.  Patient had taken his daily glyburide, but not his  metformin yet.  Last hemoglobin A1c noted to be 6.9 on 05/14/2018. - Hypoglycemic protocols - Hold metformin and glyburide  - CBG's q. before meals and at bedtime with sensitive  Essential hypertension: Systolic blood pressures on admission elevated up to 180. - Continue metoprolol and lisinopril  CAD: Patient with previous history of stent placement - Held aspirin  Unsteadiness on feet: Patient utilizes a cane at baseline.   BPH - Continue finasteride and tamsulosin  DVT prophylaxis: SCDs Code Status: Full  Family Communication: Discussed plan of care with the patient family present at bedside Disposition Plan: Likely discharge in a.m. if work-up negative Consults called: GI Admission status: Observation  Norval Morton MD [facility] Pager [phone]   If 7PM-7AM, please contact night-coverage [email or web address] Password [facility]  05/14/2018, 6:34 PM

## 2018-05-14 NOTE — Telephone Encounter (Signed)
[facility] lab called with critical results. Hemoglobin 7.7. Results given verbally to [place] at 11:50am.

## 2018-05-14 NOTE — ED Notes (Signed)
Daughter Lajuana Matte [phone] Diante Barley son [phone] Atwood Adcock [phone]

## 2018-05-14 NOTE — ED Provider Notes (Signed)
[place] EMERGENCY DEPARTMENT Provider Note   CSN: [id number] Arrival date & time: 05/14/18  1327     History   Chief Complaint Chief Complaint  Patient presents with  . Abnormal Lab    HPI Paul WEISSINGER is a 84 y.o. male with a PMHx of BPH, CAD s/p stenting, DM2, GERD, HTN, HLD, and other conditions listed below, who presents to the ED with complaints of abnormal lab work at PCPs office today.  Pt was seen by his PCP Dr. [name] at [facility] for routine f/up of his diabetes, labs were done and showed Hgb of 7.7, so he was called and advised to come here for further evaluation.  Patient states that for the last 6 months he has noticed he gets lightheaded when he stands or does any activity, and this improves with sitting or laying down; one time 6 months ago he actually fell because of the lightheadedness.  He has also noticed feeling very fatigued over the last 6 months.  He mentions that he noticed that his stool has been black and tarry occasionally over the last 2 months.  He has not tried anything for symptoms, no other known additional aggravating factors.  He is not on any blood thinners aside from taking a baby aspirin every day.  He does not drink alcohol.  He has never been told that he is anemic and has never needed a transfusion.  He mentions that he will occasionally get "an upset stomach" and diarrhea when he eats, this happens about once a week, but he has not had any issues in the last week.  He states that his PCP didn't think there was anything to worry about with regards to this issue.  Otherwise he denies any other symptoms, denies any recent fevers or chills, CP, SOB, current or ongoing abdominal pain, nausea, vomiting, ongoing diarrhea, constipation, hematochezia, dysuria, hematuria, myalgias, arthralgias, numbness, [place], focal weakness, or any other complaints at this time.  Of note, per chart review, his last colonoscopy was 06/19/11 by Dr. Ardis Hughs at [facility], and showed mild diverticulosis in sigmoid and descending colon but otherwise normal exam without polyps or cancers.  He has not been back to them since then, since they told him he didn't need another colonoscopy due to his age.    The history is provided by medical records and the patient. No language interpreter was used.  Abnormal Lab    Past Medical History:  Diagnosis Date  . Arthritis   . Basal cell carcinoma of scalp 12/16/07   Reexcision (Dr. Merril Abbe. Teressa Senter)  . BPH (benign prostatic hyperplasia)   . CAD (coronary artery disease) 11/01   stent placed  . Cataract   . Diabetes mellitus type II 11/01  . GERD (gastroesophageal reflux disease)    occasionally  . Hearing aid worn   . Hyperlipemia 11/01  . Hypertension 11/01  . Ruptured disc, cervical    Neck C7 (Dr. Pearlie Oyster)    Patient Active Problem List   Diagnosis Date Noted  . Unsteadiness on feet 11/05/2017  . Spinal stenosis of lumbar region 11/08/2015  . Advance care planning 11/04/2014  . Leg pain 01/30/2014  . Medicare annual wellness visit, subsequent 11/01/2013  . BPH (benign prostatic hyperplasia)   . Type 2 diabetes mellitus with vascular disease ([place])   . Hyperlipidemia   . Hypertensive heart disease   . CAD (coronary artery disease)     Past Surgical History:  Procedure Laterality Date  .  BASAL CELL CARCINOMA EXCISION    . BUNIONECTOMY  02/2000   Dr. Janus Molder - Right  . CERVICAL FUSION    . COLONOSCOPY     2012  . KNEE ARTHROSCOPY  09/1999   Right  . LACERATION REPAIR  09/1999   Right Hand (Dr. Fredna Dow)  . LITHOTRIPSY  1991   Dr. Elyse Jarvis  . ROTATOR CUFF REPAIR  02/05   Left        Home Medications    Prior to Admission medications   Medication Sig Start Date End Date Taking? Authorizing Provider  aspirin 81 MG tablet Take 81 mg by mouth daily.      [provider]  Cholecalciferol (VITAMIN D-3) 5000 UNITS TABS Take by mouth daily.    [provider]    finasteride (PROSCAR) 5 MG tablet TAKE 1 TABLET (5 MG TOTAL) BY MOUTH DAILY. 04/14/18   Tonia Ghent, MD  glyBURIDE (DIABETA) 5 MG tablet TAKE 1 TABLET EVERY MORNING. 05/14/18   Tonia Ghent, MD  Lancets [facility] ULTRASOFT) lancets Test once daily Dx 250.00     [provider]  lisinopril (ZESTRIL) 2.5 MG tablet Take 1 tablet (2.5 mg total) by mouth daily. 11/11/17   Tonia Ghent, MD  metFORMIN (GLUCOPHAGE) 1000 MG tablet TAKE 1 TABLET TWICE DAILY 03/24/18   Tonia Ghent, MD  metoprolol succinate (TOPROL-XL) 50 MG 24 hr tablet TAKE 1 TABLET EVERY DAY  WITH  OR  FOLLOWING  A  MEAL 02/11/18   Tonia Ghent, MD  Multiple Vitamin (MULTIVITAMIN) tablet Take 1 tablet by mouth daily.      [provider]  Omega-3 Fatty Acids (FISH OIL) 1000 MG CAPS Take by mouth daily.      [provider]  ONE TOUCH ULTRA TEST test strip TEST BLOOD SUGAR ONCE DAILY. DX: 250.00 06/15/14   Tonia Ghent, MD  pravastatin (PRAVACHOL) 20 MG tablet TAKE 1 TABLET EVERY DAY 01/01/18   Tonia Ghent, MD  tamsulosin (FLOMAX) 0.4 MG CAPS capsule TAKE 1 CAPSULE (0.4 MG TOTAL) BY MOUTH DAILY. 03/03/18   Tonia Ghent, MD  vitamin B-12 (CYANOCOBALAMIN) 250 MCG tablet Take 250 mcg by mouth 2 (two) times daily.     [provider]    Family History Family History  Problem Relation Age of Onset  . Stroke Mother        Lived 2 years  . Heart disease Mother        CAD, Angioplasty X 2  . Hypertension Mother   . Heart disease Father        MI  . Hypertension Father   . Hypertension Sister   . Hypertension Brother   . Heart disease Brother        MI  . Hyperlipidemia Brother   . Heart disease Brother        MI    Social History Social History   Tobacco Use  . Smoking status: Former Smoker    Packs/day: 1.00    Years: 25.00    Pack years: 25.00    Types: Cigarettes    Last attempt to quit: 01/09/1976    Years since quitting: 42.3  . Smokeless tobacco: Current  User    Types: Chew  . Tobacco comment: quit smoking about 25 years ago  Substance Use Topics  . Alcohol use: No  . Drug use: No     Allergies   Simvastatin   Review of Systems Review of  Systems  Constitutional: Positive for fatigue. Negative for chills and fever.  Respiratory: Negative for shortness of breath.   Cardiovascular: Negative for chest pain.  Gastrointestinal: Positive for blood in stool (melena). Negative for abdominal pain (intermittent, none in 1wk), constipation, diarrhea (intermittent, none in 1wk), nausea and vomiting.  Genitourinary: Negative for dysuria and hematuria.  Musculoskeletal: Negative for arthralgias and myalgias.  Skin: Negative for color change.  Allergic/Immunologic: Positive for immunocompromised state (DM2).  Neurological: Positive for light-headedness. Negative for weakness and numbness.  Hematological: Does not bruise/bleed easily.  Psychiatric/Behavioral: Negative for confusion.   All other systems reviewed and are negative for acute change except as noted in the HPI.    Physical Exam Updated Vital Signs BP (!) 183/62   Pulse (!) 59   Temp 98.5 F (36.9 C) (Oral)   Resp 17   SpO2 100%   Physical Exam  Constitutional: He is oriented to person, place, and time. Vital signs are normal. He appears well-developed and well-nourished.  Non-toxic appearance. No distress.  Afebrile, nontoxic, NAD  HENT:  Head: Normocephalic and atraumatic.  Mouth/Throat: Oropharynx is clear and moist and mucous membranes are normal.  Eyes: Conjunctivae and EOM are normal. Right eye exhibits no discharge. Left eye exhibits no discharge.  Conjunctival pallor  Neck: Normal range of motion. Neck supple.  Cardiovascular: Normal rate, regular rhythm, normal heart sounds and intact distal pulses. Exam reveals no gallop and no friction rub.  No murmur heard. Pulmonary/Chest: Effort normal and breath sounds normal. No respiratory distress. He has no decreased  breath sounds. He has no wheezes. He has no rhonchi. He has no rales.  Abdominal: Soft. Normal appearance and bowel sounds are normal. He exhibits no distension. There is no tenderness. There is no rigidity, no rebound, no guarding, no CVA tenderness, no tenderness at McBurney's point and negative Murphy's sign.  Soft, NTND, +BS throughout, no r/g/r, neg murphy's, neg mcburney's, no CVA TTP   Genitourinary: Rectum normal. Rectal exam shows no external hemorrhoid, no internal hemorrhoid, no fissure, no mass, no tenderness, anal tone normal and guaiac negative stool. Prostate is enlarged. Prostate is not tender.  Genitourinary Comments: Chaperone present Light brown stool in rectal vault. No gross blood noted on rectal exam, normal tone, no tenderness, no mass or fissure, no hemorrhoids. FOBT neg Prostate mildly enlarged and slightly rubbery, but without warmth, tenderness, or bogginess.   Musculoskeletal: Normal range of motion.  Neurological: He is alert and oriented to person, place, and time. He has normal strength. No sensory deficit.  Skin: Skin is warm, dry and intact. No rash noted.  Psychiatric: He has a normal mood and affect.  Nursing note and vitals reviewed.    ED Treatments / Results  Labs (all labs ordered are listed, but only abnormal results are displayed) Labs Reviewed  COMPREHENSIVE METABOLIC PANEL - Abnormal; Notable for the following components:      Result Value   Glucose, Bld 215 (*)    Total Protein 6.2 (*)    All other components within normal limits  CBC - Abnormal; Notable for the following components:   RBC 3.81 (*)    Hemoglobin 7.6 (*)    HCT 27.9 (*)    MCV 73.2 (*)    MCH 19.9 (*)    MCHC 27.2 (*)    RDW 18.1 (*)    All other components within normal limits  POC OCCULT BLOOD, ED  TYPE AND SCREEN  ABO/RH  PREPARE RBC (CROSSMATCH)  EKG None  Radiology No results found.    COLONOSCOPY 06/19/11: mild diverticulosis in sigmoid to descending  colon segments, otherwise normal exam, no polyps or cancers   Procedures Procedures (including critical care time)  CRITICAL CARE-symptomatic anemia requiring transfusion Performed by: Reece Agar   Total critical care time: 40 minutes  Critical care time was exclusive of separately billable procedures and treating other patients.  Critical care was necessary to treat or prevent imminent or life-threatening deterioration.  Critical care was time spent personally by me on the following activities: development of treatment plan with patient and/or surrogate as well as nursing, discussions with consultants, evaluation of patient's response to treatment, examination of patient, obtaining history from patient or surrogate, ordering and performing treatments and interventions, ordering and review of laboratory studies, ordering and review of radiographic studies, pulse oximetry and re-evaluation of patient's condition.   Medications Ordered in ED Medications  0.9 %  sodium chloride infusion (has no administration in time range)     Initial Impression / Assessment and Plan / ED Course  I have reviewed the triage vital signs and the nursing notes.  Pertinent labs & imaging results that were available during my care of the patient were reviewed by me and considered in my medical decision making (see chart for details).     84 y.o. male here with abnormal labs, Hgb 7.7 at PCPs office so sent here. Has had about 10months of lightheadedness with standing/activity, fatigue, and 2 months of intermittent melena. Has intermittent abd pain/discomfort and diarrhea with eating about once a week, none in the last week. On exam, no abd tenderness, rectal exam with light brown stool in rectal vault and FOBT negative, no definite hemorrhoids appreciated. Work up here shows: CMP with gluc 215 but otherwise fairly unremarkable; CBC confirming microcytic anemia Hgb 7.6 (prior baseline 13.7-14.1) with MCV  73.2. Pt's symptoms likely from anemia, will require transfusion. R/B/A discussed and pt wishes to proceed. Will proceed with transfusion and admission, doubt need for abd imaging at this time. Discussed case with my attending Dr. Vanita Panda who agrees with plan.   5:54 PM Dr. Tamala Julian of [facility] returning page, wants me to touch base with GI to see if they would want to do any further testing on him, if so then he would admit. Will touch base with GI.   6:09 PM Dr. Ardis Hughs of GI returning page, will consult on pt in the morning and determine further testing/treatment plan. I appreciate his assistance with this pt's care. Will call back hospitalist and inform him of this plan, and admit.   6:20 PM Spoke with Dr. Tamala Julian of [facility] and advised him of the plan, and he will admit the pt. Holding orders to be placed by admitting team. Please see their notes for further documentation of care. I appreciate their help with this pleasant pt's care. Pt stable at time of admission.    Final Clinical Impressions(s) / ED Diagnoses   Final diagnoses:  Symptomatic anemia  Melena  Fatigue, unspecified type  Lightheadedness    ED Discharge Orders    [address], [place], [place] 05/14/18 1821    Carmin Muskrat, MD 05/14/18 2220

## 2018-05-14 NOTE — Progress Notes (Signed)
Diabetes:  Using medications without difficulties: yes, on lower dose of glyburide since last OV Hypoglycemic episodes:no Hyperglycemic episodes:no Feet problems:some tingling at baseline, not changed.   Blood Sugars averaging: ~150-200 A1c d/w pt.    He isn't lightheaded.  We talked about fall cautions.  Still using a cane.  Balance cautions d/w pt.  He declined PT referral at this point- he is trying to do balance exercises at home.  We talked about having a fall button- he declined. He keeps a phone with him.    Meds, vitals, and allergies reviewed.   ROS: Per HPI unless specifically indicated in ROS section   GEN: nad, alert and oriented HEENT: mucous membranes moist NECK: supple w/o LA CV: rrr. PULM: ctab, no inc wob ABD: soft, +bs EXT: no edema SKIN:well perfused.

## 2018-05-15 ENCOUNTER — Other Ambulatory Visit: Payer: Self-pay

## 2018-05-15 DIAGNOSIS — I10 Essential (primary) hypertension: Secondary | ICD-10-CM | POA: Diagnosis not present

## 2018-05-15 DIAGNOSIS — K921 Melena: Secondary | ICD-10-CM

## 2018-05-15 DIAGNOSIS — I2510 Atherosclerotic heart disease of native coronary artery without angina pectoris: Secondary | ICD-10-CM | POA: Diagnosis not present

## 2018-05-15 DIAGNOSIS — D649 Anemia, unspecified: Secondary | ICD-10-CM

## 2018-05-15 DIAGNOSIS — D509 Iron deficiency anemia, unspecified: Secondary | ICD-10-CM | POA: Diagnosis not present

## 2018-05-15 LAB — BASIC METABOLIC PANEL
Anion gap: 8 (ref 5–15)
BUN: 11 mg/dL (ref 8–23)
CO2: 27 mmol/L (ref 22–32)
Calcium: 9.1 mg/dL (ref 8.9–10.3)
Chloride: 103 mmol/L (ref 98–111)
Creatinine, Ser: 0.81 mg/dL (ref 0.61–1.24)
GFR calc Af Amer: >60 mL/min (ref >60)
GFR calc non Af Amer: >60 mL/min (ref >60)
Glucose, Bld: 128 mg/dL — ABNORMAL HIGH (ref 70–99)
Potassium: 3.8 mmol/L (ref 3.5–5.1)
Sodium: 138 mmol/L (ref 135–145)

## 2018-05-15 LAB — CBC
HCT: 28.8 % — ABNORMAL LOW (ref 39.0–52.0)
Hemoglobin: 8.3 g/dL — ABNORMAL LOW (ref 13.0–17.0)
MCH: 20.9 pg — ABNORMAL LOW (ref 26.0–34.0)
MCHC: 28.8 g/dL — ABNORMAL LOW (ref 30.0–36.0)
MCV: 72.5 fL — ABNORMAL LOW (ref 78.0–100.0)
Platelets: 190 10*3/uL (ref 150–400)
RBC: 3.97 MIL/uL — ABNORMAL LOW (ref 4.22–5.81)
RDW: 19 % — ABNORMAL HIGH (ref 11.5–15.5)
WBC: 6.3 10*3/uL (ref 4.0–10.5)

## 2018-05-15 LAB — GLUCOSE, CAPILLARY
Glucose-Capillary: 123 mg/dL — ABNORMAL HIGH (ref 70–99)
Glucose-Capillary: 172 mg/dL — ABNORMAL HIGH (ref 70–99)
Glucose-Capillary: 106 mg/dL — ABNORMAL HIGH (ref 70–99)
Glucose-Capillary: 153 mg/dL — ABNORMAL HIGH (ref 70–99)

## 2018-05-15 MED ORDER — LISINOPRIL 10 MG PO TABS
10.0000 mg | ORAL_TABLET | Freq: Every day | ORAL | Status: DC
  Administered 2018-05-15 – 2018-05-16 (×2): 10 mg via ORAL
  Filled 2018-05-15 (×2): qty 1

## 2018-05-15 NOTE — H&P (View-Only) (Signed)
[facility] Consult: 2:22 PM 05/15/2018  LOS: 0 days    Referring Provider: Dr Sloan Leiter  Primary Care Physician:  Tonia Ghent, MD Primary Gastroenterologist:  Dr. Ardis Hughs.       Reason for Consultation:  anemia, hgb 7s, intermittent dark stools   HPI: Paul Terrell is a 84 y.o. male.  Diabetic on oral agents.  BPH.  Peripheral neuropathy in his feet.  Hypertension.  Hyperlipidemia appearing hearing loss.  CAD, about cardiac stenting.  Cervical spine surgery.  Nephrolithiasis, remote lithotripsy.   05/2011 colonoscopy, routine risk screening at age 72.  Dr. Owens Loffler.  Diverticulosis in the sigmoid and descending colon, otherwise normal study.  Given age and lack of significant findings, no need for repeat screening colonoscopy.  Visit with Dr. [name], PMD, yesterday with routine lab work which showed a Hgb of 7.7 and microcytic indices.  Pt fell down and injured his back and hips about 6 months ago since then he is been feeling tired and fatigued.  Mild dizziness.  Intermittent episodes of stomach upset over the last couple of months described as a crampy feeling associated with having to move loose stools.  It is happening ~ once a week.  Intermittent dark stool.  Patient contacted and asked to report to the hospital for evaluation. He takes a couple of Aleve every day.  Daily 81 ASA.  He is not on PPI or H2 blocker.  Has not had nausea, vomiting or reflux symptoms.  No dysphagia.  He actually denies any significant abdominal pain.  Hgb 7.6 >> 0.3 after 1 U PRBCs. MCV 66.  Platelets 178. FOBT negative. BUN is not elevated.  LFTs normal. No etiologic imaging thus far.     Past Medical History:  Diagnosis Date  . Arthritis   . Basal cell carcinoma of scalp 12/16/07   Reexcision (Dr. Merril Abbe.  Teressa Senter)  . BPH (benign prostatic hyperplasia)   . CAD (coronary artery disease) 11/01   stent placed  . Cataract   . Diabetes mellitus type II 11/01  . GERD (gastroesophageal reflux disease)    occasionally  . Hearing aid worn   . Hyperlipemia 11/01  . Hypertension 11/01  . Ruptured disc, cervical    Neck C7 (Dr. Pearlie Oyster)    Past Surgical History:  Procedure Laterality Date  . BASAL CELL CARCINOMA EXCISION    . BUNIONECTOMY  02/2000   Dr. Janus Molder - Right  . CERVICAL FUSION    . COLONOSCOPY     2012  . KNEE ARTHROSCOPY  09/1999   Right  . LACERATION REPAIR  09/1999   Right Hand (Dr. Fredna Dow)  . LITHOTRIPSY  1991   Dr. Elyse Jarvis  . ROTATOR CUFF REPAIR  02/05   Left    Prior to Admission medications   Medication Sig Start Date End Date Taking? Authorizing Provider  acetaminophen (TYLENOL) 500 MG tablet Take 500 mg by mouth every 6 (six) hours as needed (for pain or headaches).   Yes [provider]  aspirin 81 MG tablet Take 81 mg by mouth  at bedtime.    Yes [provider]  Cholecalciferol (VITAMIN D-3) 5000 UNITS TABS Take 5,000 Units by mouth daily.    Yes [provider]  finasteride (PROSCAR) 5 MG tablet TAKE 1 TABLET (5 MG TOTAL) BY MOUTH DAILY. Patient taking differently: Take 5 mg by mouth at bedtime.  04/14/18  Yes Tonia Ghent, MD  glyBURIDE (DIABETA) 5 MG tablet TAKE 1 TABLET EVERY MORNING. 05/14/18  Yes Tonia Ghent, MD  lisinopril (ZESTRIL) 2.5 MG tablet Take 1 tablet (2.5 mg total) by mouth daily. Patient taking differently: Take 2.5 mg by mouth at bedtime.  11/11/17  Yes Tonia Ghent, MD  metFORMIN (GLUCOPHAGE) 1000 MG tablet TAKE 1 TABLET TWICE DAILY 03/24/18  Yes Tonia Ghent, MD  metoprolol succinate (TOPROL-XL) 50 MG 24 hr tablet TAKE 1 TABLET EVERY DAY  WITH  OR  FOLLOWING  A  MEAL Patient taking differently: Take 50 mg by mouth daily.  02/11/18  Yes Tonia Ghent, MD  Multiple Vitamin (MULTIVITAMIN) tablet Take 1  tablet by mouth daily.     Yes [provider]  pravastatin (PRAVACHOL) 20 MG tablet TAKE 1 TABLET EVERY DAY Patient taking differently: Take 20 mg by mouth at bedtime.  01/01/18  Yes Tonia Ghent, MD  tamsulosin (FLOMAX) 0.4 MG CAPS capsule TAKE 1 CAPSULE (0.4 MG TOTAL) BY MOUTH DAILY. Patient taking differently: Take 0.4 mg by mouth at bedtime.  03/03/18  Yes Tonia Ghent, MD  vitamin B-12 (CYANOCOBALAMIN) 250 MCG tablet Take 250 mcg by mouth 2 (two) times daily.    Yes [provider]  Lancets (ONETOUCH ULTRASOFT) lancets Test once daily Dx 250.00     [provider]  ONE TOUCH ULTRA TEST test strip TEST BLOOD SUGAR ONCE DAILY. DX: 250.00 Patient taking differently: 1 each by Other route daily.  06/15/14   Tonia Ghent, MD    Scheduled Meds: . finasteride  5 mg Oral QHS  . insulin aspart  0-5 Units Subcutaneous QHS  . insulin aspart  0-9 Units Subcutaneous TID WC  . lisinopril  10 mg Oral QHS  . metoprolol succinate  50 mg Oral Daily  . pantoprazole  40 mg Oral Daily  . pravastatin  20 mg Oral QHS  . tamsulosin  0.4 mg Oral QHS   Infusions:  PRN Meds: acetaminophen **OR** acetaminophen, albuterol, hydrALAZINE, ondansetron **OR** ondansetron (ZOFRAN) IV   Allergies as of 05/14/2018 - Review Complete 05/14/2018  Allergen Reaction Noted  . Citrus Hives 05/14/2018  . Simvastatin Other (See Comments) 02/10/2014    Family History  Problem Relation Age of Onset  . Stroke Mother        Lived 2 years  . Heart disease Mother        CAD, Angioplasty X 2  . Hypertension Mother   . Heart disease Father        MI  . Hypertension Father   . Hypertension Sister   . Hypertension Brother   . Heart disease Brother        MI  . Hyperlipidemia Brother   . Heart disease Brother        MI    Social History   Socioeconomic History  . Marital status: Married    Spouse name: Not on file  . Number of children: 2  . Years of education: Not on file    . Highest education level: Not on file  Occupational History  . Occupation: Biomedical engineer  Social  Needs  . Financial resource strain: Not on file  . Food insecurity:    Worry: Not on file    Inability: Not on file  . Transportation needs:    Medical: Not on file    Non-medical: Not on file  Tobacco Use  . Smoking status: Former Smoker    Packs/day: 1.00    Years: 25.00    Pack years: 25.00    Types: Cigarettes    Last attempt to quit: 01/09/1976    Years since quitting: 42.3  . Smokeless tobacco: Current User    Types: Chew  . Tobacco comment: quit smoking about 25 years ago  Substance and Sexual Activity  . Alcohol use: No  . Drug use: No  . Sexual activity: Never  Lifestyle  . Physical activity:    Days per week: Not on file    Minutes per session: Not on file  . Stress: Not on file  Relationships  . Social connections:    Talks on phone: Not on file    Gets together: Not on file    Attends religious service: Not on file    Active member of club or organization: Not on file    Attends meetings of clubs or organizations: Not on file    Relationship status: Not on file  . Intimate partner violence:    Fear of current or ex partner: Not on file    Emotionally abused: Not on file    Physically abused: Not on file    Forced sexual activity: Not on file  Other Topics Concern  . Not on file  Social History Narrative   Married 1955   2 kids   Retired from general [employer]    [place]: Constitutional:  Per hpi ENT:  No nose bleeds Pulm: No cough, no dyspnea CV:  No palpitations, no LE edema.  GU:  No hematuria, no frequency GI:  Per HPI Heme: Denies excessive bleeding or significant bruising other than purpura that is on his arms.  No prior problems with anemia or bleeding Transfusions: None prior to today. Neuro:  No headaches.  Some tingling pain in his feet. Derm:  No itching, no rash or sores.  Endocrine:  No sweats or chills.  No polyuria or  dysuria Immunization: Reviewed. Travel:  None beyond local counties in last few months.    PHYSICAL EXAM: Vital signs in last 24 hours: Vitals:   05/15/18 0022 05/15/18 0847  BP: (!) 173/89 (!) 171/56  Pulse: (!) 58 (!) 55  Resp: 17   Temp: 97.7 F (36.5 C) 97.6 F (36.4 C)  SpO2: 98% 97%   Wt Readings from Last 3 Encounters:  05/15/18 71.1 kg  05/14/18 72.5 kg  02/10/18 75.2 kg    General: Very hard of hearing.  A lot of the communication occurred with his wife.  The patient is alert, comfortable and does not look ill. Head: No facial asymmetry or swelling.  No signs of head trauma. Eyes: Conjunctiva pale.  No scleral icterus.  EOMI. Ears: Hard of hearing Nose: No congestion or discharge Mouth: Tongue midline.  Full dentures in place.  Oral mucosa pink, clear, moist. Neck: No JVD, no masses, no thyromegaly. Lungs: Clear bilaterally.  No cough or labored breathing. Heart: Regular.  Sinus bradycardia in the 50s on telemetry.  No MRG.  Heart sounds are distant. Abdomen: Soft.  No organomegaly, masses, bruits, hernias.  Not tender or distended.  Active bowel sounds..   Rectal: Heard.  FOBT negative yesterday. Musc/Skeltl: No joint redness, swelling or significant deformity. Extremities: Feet are warm.  Pedal pulses 2-3+ bilaterally. Neurologic: Hard of hearing, nearly deaf.  Moves all 4 limbs.  No tremor. Skin: No rashes, no sores.  Purpura on his arms bilaterally. Tattoos: None Nodes: No cervical adenopathy. Psych: Calm, pleasant, cooperative.  Intake/Output from previous day: 08/22 0701 - 08/23 0700 In: 315 [Blood:315] Out: -  Intake/Output this shift: No intake/output data recorded.  LAB RESULTS: Recent Labs    05/14/18 1005 05/14/18 1424 05/15/18 0304  WBC 5.5 5.5 6.3  HGB 7.7 Repeated and verified X2.* 7.6* 8.3*  HCT 25.1 Repeated and verified X2.* 27.9* 28.8*  PLT 178.0 214 190   BMET Lab Results  Component Value Date   NA 138 05/15/2018   NA 137  05/14/2018   NA 139 08/05/2017   K 3.8 05/15/2018   K 4.3 05/14/2018   K 5.0 08/05/2017   CL 103 05/15/2018   CL 105 05/14/2018   CL 103 08/05/2017   CO2 27 05/15/2018   CO2 26 05/14/2018   CO2 26 08/05/2017   GLUCOSE 128 (H) 05/15/2018   GLUCOSE 215 (H) 05/14/2018   GLUCOSE 187 (H) 08/05/2017   BUN 11 05/15/2018   BUN 13 05/14/2018   BUN 13 08/05/2017   CREATININE 0.81 05/15/2018   CREATININE 0.92 05/14/2018   CREATININE 1.00 08/05/2017   CALCIUM 9.1 05/15/2018   CALCIUM 9.1 05/14/2018   CALCIUM 9.6 08/05/2017   LFT Recent Labs    05/14/18 1424  PROT 6.2*  ALBUMIN 3.7  AST 27  ALT 24  ALKPHOS 50  BILITOT 0.6   PT/INR No results found for: INR, PROTIME Hepatitis Panel No results for input(s): HEPBSAG, HCVAB, HEPAIGM, HEPBIGM in the last 72 hours. C-Diff No components found for: CDIFF Lipase  No results found for: LIPASE  Drugs of Abuse  No results found for: LABOPIA, COCAINSCRNUR, LABBENZ, AMPHETMU, THCU, LABBARB   RADIOLOGY STUDIES: No results found.    IMPRESSION:   *   Microcytic anemia. Reports of intermittent dark stools and GI upset.  Patient taking 2 Aleve tablets and 81 mg ASA daily, long-term and not on any gastric acid suppressing medications. Rule out gastritis, ulcer disease, AVMs. Hgb improved following single PRBC transfusion.    PLAN:     *   Plan upper endoscopy tomorrow.  *  Iron studies in AM.    *   Okay to have full liquids today, n.p.o. for the procedure tomorrow.  I am going to increase the Protonix to twice daily.   Azucena Freed  05/15/2018, 2:22 PM Phone [phone]

## 2018-05-15 NOTE — Progress Notes (Signed)
PROGRESS NOTE        PATIENT DETAILS Name: Paul Terrell Age: 84 y.o. Sex: male Date of Birth: [birth year] Admit Date: 05/14/2018 Admitting Physician Norval Morton, MD [id number], [name], MD  Brief Narrative: Patient is a 84 y.o. male with history of hypertension, dyslipidemia, history of CAD status post PCI, very hard of hearing sent from PCPs office for evaluation of symptomatic anemia (describes a few weeks history of exertional dyspnea).  Patient does acknowledge a few few intermittent episodes of dark stools a few weeks back.  Patient was subsequently admitted to the hospital service for further evaluation and treatment.  Subjective: No abdominal pain, no chest pain or shortness of breath.  Lying comfortably in bed.  Very hard of hearing-hence poor historian.  Assessment/Plan: Microcytic anemia: Symptomatic-and hence transfused 1 unit of PRBC on admission.  Hemoglobin currently stable at 8.3.  Given history of intermittent black tarry stools-could be secondary to recent blood loss.  Follow CBC.  ?  Recent GI bleeding: Does acknowledge history of intermittent dark stools a few weeks back, has new onset microcytic anemia-but no overt bleeding in the past few days.  FOBT negative as well.  Will await GI evaluation to see if patient requires a endoscopic evaluation before discharge.  DM-2: CBGs stable with SSI-continue to hold oral hypoglycemic agents.  CAD: Denies chest pain, aspirin currently on hold due to suspicion for recent GI bleeding.  Apparently has a history of remote PCI in the past.  Hypertension: Blood pressure on the higher side-continue metoprolol, increase lisinopril to 10 mg.  Follow.  Dyslipidemia: Continue with statin.  BPH: Continue Flomax  DVT Prophylaxis: SCD's  Code Status: Full code  Family Communication: None at bedside  Disposition Plan: Remain inpatient-home after GI evaluation has been completed.  Antimicrobial  agents: Anti-infectives (From admission, onward)   None      Procedures: None  CONSULTS:  GI  Time spent: 25- minutes-Greater than 50% of this time was spent in counseling, explanation of diagnosis, planning of further management, and coordination of care.  MEDICATIONS: Scheduled Meds: . finasteride  5 mg Oral QHS  . insulin aspart  0-5 Units Subcutaneous QHS  . insulin aspart  0-9 Units Subcutaneous TID WC  . lisinopril  2.5 mg Oral QHS  . metoprolol succinate  50 mg Oral Daily  . pantoprazole  40 mg Oral Daily  . pravastatin  20 mg Oral QHS  . tamsulosin  0.4 mg Oral QHS   Continuous Infusions: PRN Meds:.acetaminophen **OR** acetaminophen, albuterol, hydrALAZINE, ondansetron **OR** ondansetron (ZOFRAN) IV   PHYSICAL EXAM: Vital signs: Vitals:   05/14/18 2309 05/15/18 0022 05/15/18 0027 05/15/18 0847  BP: (!) 174/58 (!) 173/89  (!) 171/56  Pulse: (!) 58 (!) 58  (!) 55  Resp: 16 17    Temp:  97.7 F (36.5 C)  97.6 F (36.4 C)  TempSrc:  Oral  Oral  SpO2: 99% 98%  97%  Weight:   71.1 kg   Height:   5\' 6"  (1.676 m)    Filed Weights   05/15/18 0027  Weight: 71.1 kg   Body mass index is 25.3 kg/m.   General appearance :Awake, alert, not in any distress.  Eyes:Pink conjunctiva HEENT: Atraumatic and Normocephalic Neck: supple, no JVD. No cervical lymphadenopathy. No thyromegaly Resp:Good air entry bilaterally, no added sounds  CVS: S1  S2 regular, no murmurs.  GI: Bowel sounds present, Non tender and not distended with no gaurding, rigidity or rebound.No organomegaly Extremities: B/L Lower Ext shows no edema, both legs are warm to touch Neurology:  speech clear,Non focal, sensation is grossly intact. Musculoskeletal:No digital cyanosis Skin:No Rash, warm and dry Wounds:N/A  I have personally reviewed following labs and imaging studies  LABORATORY DATA: CBC: Recent Labs  Lab 05/14/18 1005 05/14/18 1424 05/15/18 0304  WBC 5.5 5.5 6.3  NEUTROABS 3.3   --   --   HGB 7.7 Repeated and verified X2.* 7.6* 8.3*  HCT 25.1 Repeated and verified X2.* 27.9* 28.8*  MCV 66.1 Repeated and verified X2.* 73.2* 72.5*  PLT 178.0 214 482    Basic Metabolic Panel: Recent Labs  Lab 05/14/18 1424 05/15/18 0304  NA 137 138  K 4.3 3.8  CL 105 103  CO2 26 27  GLUCOSE 215* 128*  BUN 13 11  CREATININE 0.92 0.81  CALCIUM 9.1 9.1    GFR: Estimated Creatinine Clearance: 60.2 mL/min (by C-G formula based on SCr of 0.81 mg/dL).  Liver Function Tests: Recent Labs  Lab 05/14/18 1424  AST 27  ALT 24  ALKPHOS 50  BILITOT 0.6  PROT 6.2*  ALBUMIN 3.7   No results for input(s): LIPASE, AMYLASE in the last 168 hours. No results for input(s): AMMONIA in the last 168 hours.  Coagulation Profile: No results for input(s): INR, PROTIME in the last 168 hours.  Cardiac Enzymes: No results for input(s): CKTOTAL, CKMB, CKMBINDEX, TROPONINI in the last 168 hours.  BNP (last 3 results) No results for input(s): PROBNP in the last 8760 hours.  HbA1C: Recent Labs    05/14/18 0945  HGBA1C 6.9*    CBG: Recent Labs  Lab 05/14/18 2242 05/15/18 0848 05/15/18 1205  GLUCAP 120* 123* 172*    Lipid Profile: No results for input(s): CHOL, HDL, LDLCALC, TRIG, CHOLHDL, LDLDIRECT in the last 72 hours.  Thyroid Function Tests: No results for input(s): TSH, T4TOTAL, FREET4, T3FREE, THYROIDAB in the last 72 hours.  Anemia Panel: No results for input(s): VITAMINB12, FOLATE, FERRITIN, TIBC, IRON, RETICCTPCT in the last 72 hours.  Urine analysis: No results found for: COLORURINE, APPEARANCEUR, LABSPEC, PHURINE, GLUCOSEU, HGBUR, BILIRUBINUR, KETONESUR, PROTEINUR, UROBILINOGEN, NITRITE, LEUKOCYTESUR  Sepsis Labs: Lactic Acid, Venous No results found for: LATICACIDVEN  MICROBIOLOGY: No results found for this or any previous visit (from the past 240 hour(s)).  RADIOLOGY STUDIES/RESULTS: Dg Foot 2 Views Left  Result Date: 05/01/2018 Please see detailed  radiograph report in office note.  Dg Foot Complete Right  Result Date: 05/01/2018 Please see detailed radiograph report in office note.    LOS: 0 days   Oren Binet, MD  [facility]  If 7PM-7AM, please contact night-coverage  Please page via [email or web address]-click on MD name and type text message  05/15/2018, 12:57 PM

## 2018-05-15 NOTE — Telephone Encounter (Signed)
Pt contacted, admitted.  Thanks.

## 2018-05-15 NOTE — Progress Notes (Signed)
Moved patient from room 2W10 to 2W17 due to facilities needing to fix the room family informed.

## 2018-05-15 NOTE — Progress Notes (Signed)
Initial Nutrition Assessment  DOCUMENTATION CODES:   Not applicable  INTERVENTION:    Boost Breeze po TID, each supplement provides 250 kcal and 9 grams of protein  NUTRITION DIAGNOSIS:   Increased nutrient needs related to acute illness as evidenced by estimated needs  GOAL:   Patient will meet greater than or equal to 90% of their needs  MONITOR:   PO intake, Supplement acceptance, Labs, Skin, Weight trends, I & O's  REASON FOR ASSESSMENT:   Malnutrition Screening Tool  ASSESSMENT:   84 yo Male with PMH significant of HTN, HLD, CAD s/p PCI, GERD, and BPH; admitted with melena, lightheadedness, sympomatic anemia and fatigue.   RD spoke with pt and wife at bedside. Pt laying in bed. Did not look at RD. Wife reports "he hasn't ate much of anything". Currently on a Clear Liquid diet. Wife also reveals pt has had recent weight loss, however, unable to quantify amount.  Would benefit from addition of oral nutrition supplements. Labs and medications reviewed. HgB 8.3 (L). CBG's [id number].  NUTRITION - FOCUSED PHYSICAL EXAM:  Completed. No muscle or fat loss noticed.  Diet Order:   Diet Order            Diet clear liquid Room service appropriate? Yes; Fluid consistency: Thin  Diet effective now             EDUCATION NEEDS:   No education needs have been identified at this time  Skin:  Skin Assessment: Reviewed RN Assessment  Last BM:  8/22  Height:   Ht Readings from Last 1 Encounters:  05/15/18 5\' 6"  (1.676 m)   Weight:   Wt Readings from Last 1 Encounters:  05/15/18 71.1 kg   BMI:  Body mass index is 25.3 kg/m.  Estimated Nutritional Needs:   Kcal:  1500-1700  Protein:  70-85 gm  Fluid:  1.5-1.7 L  Arthur Holms, RD, LDN Pager #: [phone] After-Hours Pager #: [phone]

## 2018-05-15 NOTE — Consult Note (Signed)
[facility] Consult: 2:22 PM 05/15/2018  LOS: 0 days    Referring Provider: Dr Sloan Leiter  Primary Care Physician:  Tonia Ghent, MD Primary Gastroenterologist:  Dr. Ardis Hughs.       Reason for Consultation:  anemia, hgb 7s, intermittent dark stools   HPI: Paul Terrell is a 84 y.o. male.  Diabetic on oral agents.  BPH.  Peripheral neuropathy in his feet.  Hypertension.  Hyperlipidemia appearing hearing loss.  CAD, about cardiac stenting.  Cervical spine surgery.  Nephrolithiasis, remote lithotripsy.   05/2011 colonoscopy, routine risk screening at age 68.  Dr. Owens Loffler.  Diverticulosis in the sigmoid and descending colon, otherwise normal study.  Given age and lack of significant findings, no need for repeat screening colonoscopy.  Visit with Dr. [name], PMD, yesterday with routine lab work which showed a Hgb of 7.7 and microcytic indices.  Pt fell down and injured his back and hips about 6 months ago since then he is been feeling tired and fatigued.  Mild dizziness.  Intermittent episodes of stomach upset over the last couple of months described as a crampy feeling associated with having to move loose stools.  It is happening ~ once a week.  Intermittent dark stool.  Patient contacted and asked to report to the hospital for evaluation. He takes a couple of Aleve every day.  Daily 81 ASA.  He is not on PPI or H2 blocker.  Has not had nausea, vomiting or reflux symptoms.  No dysphagia.  He actually denies any significant abdominal pain.  Hgb 7.6 >> 0.3 after 1 U PRBCs. MCV 66.  Platelets 178. FOBT negative. BUN is not elevated.  LFTs normal. No etiologic imaging thus far.     Past Medical History:  Diagnosis Date  . Arthritis   . Basal cell carcinoma of scalp 12/16/07   Reexcision (Dr. Merril Abbe.  Teressa Senter)  . BPH (benign prostatic hyperplasia)   . CAD (coronary artery disease) 11/01   stent placed  . Cataract   . Diabetes mellitus type II 11/01  . GERD (gastroesophageal reflux disease)    occasionally  . Hearing aid worn   . Hyperlipemia 11/01  . Hypertension 11/01  . Ruptured disc, cervical    Neck C7 (Dr. Pearlie Oyster)    Past Surgical History:  Procedure Laterality Date  . BASAL CELL CARCINOMA EXCISION    . BUNIONECTOMY  02/2000   Dr. Janus Molder - Right  . CERVICAL FUSION    . COLONOSCOPY     2012  . KNEE ARTHROSCOPY  09/1999   Right  . LACERATION REPAIR  09/1999   Right Hand (Dr. Fredna Dow)  . LITHOTRIPSY  1991   Dr. Elyse Jarvis  . ROTATOR CUFF REPAIR  02/05   Left    Prior to Admission medications   Medication Sig Start Date End Date Taking? Authorizing Provider  acetaminophen (TYLENOL) 500 MG tablet Take 500 mg by mouth every 6 (six) hours as needed (for pain or headaches).   Yes [provider]  aspirin 81 MG tablet Take 81 mg by mouth  at bedtime.    Yes [provider]  Cholecalciferol (VITAMIN D-3) 5000 UNITS TABS Take 5,000 Units by mouth daily.    Yes [provider]  finasteride (PROSCAR) 5 MG tablet TAKE 1 TABLET (5 MG TOTAL) BY MOUTH DAILY. Patient taking differently: Take 5 mg by mouth at bedtime.  04/14/18  Yes Tonia Ghent, MD  glyBURIDE (DIABETA) 5 MG tablet TAKE 1 TABLET EVERY MORNING. 05/14/18  Yes Tonia Ghent, MD  lisinopril (ZESTRIL) 2.5 MG tablet Take 1 tablet (2.5 mg total) by mouth daily. Patient taking differently: Take 2.5 mg by mouth at bedtime.  11/11/17  Yes Tonia Ghent, MD  metFORMIN (GLUCOPHAGE) 1000 MG tablet TAKE 1 TABLET TWICE DAILY 03/24/18  Yes Tonia Ghent, MD  metoprolol succinate (TOPROL-XL) 50 MG 24 hr tablet TAKE 1 TABLET EVERY DAY  WITH  OR  FOLLOWING  A  MEAL Patient taking differently: Take 50 mg by mouth daily.  02/11/18  Yes Tonia Ghent, MD  Multiple Vitamin (MULTIVITAMIN) tablet Take 1  tablet by mouth daily.     Yes [provider]  pravastatin (PRAVACHOL) 20 MG tablet TAKE 1 TABLET EVERY DAY Patient taking differently: Take 20 mg by mouth at bedtime.  01/01/18  Yes Tonia Ghent, MD  tamsulosin (FLOMAX) 0.4 MG CAPS capsule TAKE 1 CAPSULE (0.4 MG TOTAL) BY MOUTH DAILY. Patient taking differently: Take 0.4 mg by mouth at bedtime.  03/03/18  Yes Tonia Ghent, MD  vitamin B-12 (CYANOCOBALAMIN) 250 MCG tablet Take 250 mcg by mouth 2 (two) times daily.    Yes [provider]  Lancets (ONETOUCH ULTRASOFT) lancets Test once daily Dx 250.00     [provider]  ONE TOUCH ULTRA TEST test strip TEST BLOOD SUGAR ONCE DAILY. DX: 250.00 Patient taking differently: 1 each by Other route daily.  06/15/14   Tonia Ghent, MD    Scheduled Meds: . finasteride  5 mg Oral QHS  . insulin aspart  0-5 Units Subcutaneous QHS  . insulin aspart  0-9 Units Subcutaneous TID WC  . lisinopril  10 mg Oral QHS  . metoprolol succinate  50 mg Oral Daily  . pantoprazole  40 mg Oral Daily  . pravastatin  20 mg Oral QHS  . tamsulosin  0.4 mg Oral QHS   Infusions:  PRN Meds: acetaminophen **OR** acetaminophen, albuterol, hydrALAZINE, ondansetron **OR** ondansetron (ZOFRAN) IV   Allergies as of 05/14/2018 - Review Complete 05/14/2018  Allergen Reaction Noted  . Citrus Hives 05/14/2018  . Simvastatin Other (See Comments) 02/10/2014    Family History  Problem Relation Age of Onset  . Stroke Mother        Lived 2 years  . Heart disease Mother        CAD, Angioplasty X 2  . Hypertension Mother   . Heart disease Father        MI  . Hypertension Father   . Hypertension Sister   . Hypertension Brother   . Heart disease Brother        MI  . Hyperlipidemia Brother   . Heart disease Brother        MI    Social History   Socioeconomic History  . Marital status: Married    Spouse name: Not on file  . Number of children: 2  . Years of education: Not on file    . Highest education level: Not on file  Occupational History  . Occupation: Biomedical engineer  Social  Needs  . Financial resource strain: Not on file  . Food insecurity:    Worry: Not on file    Inability: Not on file  . Transportation needs:    Medical: Not on file    Non-medical: Not on file  Tobacco Use  . Smoking status: Former Smoker    Packs/day: 1.00    Years: 25.00    Pack years: 25.00    Types: Cigarettes    Last attempt to quit: 01/09/1976    Years since quitting: 42.3  . Smokeless tobacco: Current User    Types: Chew  . Tobacco comment: quit smoking about 25 years ago  Substance and Sexual Activity  . Alcohol use: No  . Drug use: No  . Sexual activity: Never  Lifestyle  . Physical activity:    Days per week: Not on file    Minutes per session: Not on file  . Stress: Not on file  Relationships  . Social connections:    Talks on phone: Not on file    Gets together: Not on file    Attends religious service: Not on file    Active member of club or organization: Not on file    Attends meetings of clubs or organizations: Not on file    Relationship status: Not on file  . Intimate partner violence:    Fear of current or ex partner: Not on file    Emotionally abused: Not on file    Physically abused: Not on file    Forced sexual activity: Not on file  Other Topics Concern  . Not on file  Social History Narrative   Married 1955   2 kids   Retired from general contracting    Loving: Constitutional:  Per hpi ENT:  No nose bleeds Pulm: No cough, no dyspnea CV:  No palpitations, no LE edema.  GU:  No hematuria, no frequency GI:  Per HPI Heme: Denies excessive bleeding or significant bruising other than purpura that is on his arms.  No prior problems with anemia or bleeding Transfusions: None prior to today. Neuro:  No headaches.  Some tingling pain in his feet. Derm:  No itching, no rash or sores.  Endocrine:  No sweats or chills.  No polyuria or  dysuria Immunization: Reviewed. Travel:  None beyond local counties in last few months.    PHYSICAL EXAM: Vital signs in last 24 hours: Vitals:   05/15/18 0022 05/15/18 0847  BP: (!) 173/89 (!) 171/56  Pulse: (!) 58 (!) 55  Resp: 17   Temp: 97.7 F (36.5 C) 97.6 F (36.4 C)  SpO2: 98% 97%   Wt Readings from Last 3 Encounters:  05/15/18 71.1 kg  05/14/18 72.5 kg  02/10/18 75.2 kg    General: Very hard of hearing.  A lot of the communication occurred with his wife.  The patient is alert, comfortable and does not look ill. Head: No facial asymmetry or swelling.  No signs of head trauma. Eyes: Conjunctiva pale.  No scleral icterus.  EOMI. Ears: Hard of hearing Nose: No congestion or discharge Mouth: Tongue midline.  Full dentures in place.  Oral mucosa pink, clear, moist. Neck: No JVD, no masses, no thyromegaly. Lungs: Clear bilaterally.  No cough or labored breathing. Heart: Regular.  Sinus bradycardia in the 50s on telemetry.  No MRG.  Heart sounds are distant. Abdomen: Soft.  No organomegaly, masses, bruits, hernias.  Not tender or distended.  Active bowel sounds..   Rectal: Heard.  FOBT negative yesterday. Musc/Skeltl: No joint redness, swelling or significant deformity. Extremities: Feet are warm.  Pedal pulses 2-3+ bilaterally. Neurologic: Hard of hearing, nearly deaf.  Moves all 4 limbs.  No tremor. Skin: No rashes, no sores.  Purpura on his arms bilaterally. Tattoos: None Nodes: No cervical adenopathy. Psych: Calm, pleasant, cooperative.  Intake/Output from previous day: 08/22 0701 - 08/23 0700 In: 315 [Blood:315] Out: -  Intake/Output this shift: No intake/output data recorded.  LAB RESULTS: Recent Labs    05/14/18 1005 05/14/18 1424 05/15/18 0304  WBC 5.5 5.5 6.3  HGB 7.7 Repeated and verified X2.* 7.6* 8.3*  HCT 25.1 Repeated and verified X2.* 27.9* 28.8*  PLT 178.0 214 190   BMET Lab Results  Component Value Date   NA 138 05/15/2018   NA 137  05/14/2018   NA 139 08/05/2017   K 3.8 05/15/2018   K 4.3 05/14/2018   K 5.0 08/05/2017   CL 103 05/15/2018   CL 105 05/14/2018   CL 103 08/05/2017   CO2 27 05/15/2018   CO2 26 05/14/2018   CO2 26 08/05/2017   GLUCOSE 128 (H) 05/15/2018   GLUCOSE 215 (H) 05/14/2018   GLUCOSE 187 (H) 08/05/2017   BUN 11 05/15/2018   BUN 13 05/14/2018   BUN 13 08/05/2017   CREATININE 0.81 05/15/2018   CREATININE 0.92 05/14/2018   CREATININE 1.00 08/05/2017   CALCIUM 9.1 05/15/2018   CALCIUM 9.1 05/14/2018   CALCIUM 9.6 08/05/2017   LFT Recent Labs    05/14/18 1424  PROT 6.2*  ALBUMIN 3.7  AST 27  ALT 24  ALKPHOS 50  BILITOT 0.6   PT/INR No results found for: INR, PROTIME Hepatitis Panel No results for input(s): HEPBSAG, HCVAB, HEPAIGM, HEPBIGM in the last 72 hours. C-Diff No components found for: CDIFF Lipase  No results found for: LIPASE  Drugs of Abuse  No results found for: LABOPIA, COCAINSCRNUR, LABBENZ, AMPHETMU, THCU, LABBARB   RADIOLOGY STUDIES: No results found.    IMPRESSION:   *   Microcytic anemia. Reports of intermittent dark stools and GI upset.  Patient taking 2 Aleve tablets and 81 mg ASA daily, long-term and not on any gastric acid suppressing medications. Rule out gastritis, ulcer disease, AVMs. Hgb improved following single PRBC transfusion.    PLAN:     *   Plan upper endoscopy tomorrow.  *  Iron studies in AM.    *   Okay to have full liquids today, n.p.o. for the procedure tomorrow.  I am going to increase the Protonix to twice daily.   Azucena Freed  05/15/2018, 2:22 PM Phone [phone]

## 2018-05-16 ENCOUNTER — Observation Stay (HOSPITAL_COMMUNITY): Payer: Medicare Other | Admitting: Anesthesiology

## 2018-05-16 ENCOUNTER — Encounter (HOSPITAL_COMMUNITY): Payer: Self-pay | Admitting: Gastroenterology

## 2018-05-16 ENCOUNTER — Encounter (HOSPITAL_COMMUNITY)
Admission: EM | Disposition: A | Discharge status: Home / Self Care | Payer: Self-pay | Source: Home / Self Care | Attending: Emergency Medicine

## 2018-05-16 DIAGNOSIS — N400 Enlarged prostate without lower urinary tract symptoms: Secondary | ICD-10-CM | POA: Diagnosis not present

## 2018-05-16 DIAGNOSIS — B9681 Helicobacter pylori [H. pylori] as the cause of diseases classified elsewhere: Secondary | ICD-10-CM

## 2018-05-16 DIAGNOSIS — D649 Anemia, unspecified: Secondary | ICD-10-CM | POA: Diagnosis not present

## 2018-05-16 DIAGNOSIS — B379 Candidiasis, unspecified: Secondary | ICD-10-CM | POA: Diagnosis not present

## 2018-05-16 DIAGNOSIS — K31819 Angiodysplasia of stomach and duodenum without bleeding: Secondary | ICD-10-CM

## 2018-05-16 DIAGNOSIS — D122 Benign neoplasm of ascending colon: Secondary | ICD-10-CM | POA: Diagnosis not present

## 2018-05-16 DIAGNOSIS — E1142 Type 2 diabetes mellitus with diabetic polyneuropathy: Secondary | ICD-10-CM | POA: Diagnosis not present

## 2018-05-16 DIAGNOSIS — D509 Iron deficiency anemia, unspecified: Secondary | ICD-10-CM | POA: Diagnosis not present

## 2018-05-16 DIAGNOSIS — K640 First degree hemorrhoids: Secondary | ICD-10-CM | POA: Diagnosis not present

## 2018-05-16 DIAGNOSIS — K644 Residual hemorrhoidal skin tags: Secondary | ICD-10-CM | POA: Diagnosis not present

## 2018-05-16 DIAGNOSIS — K921 Melena: Secondary | ICD-10-CM | POA: Diagnosis not present

## 2018-05-16 DIAGNOSIS — K228 Other specified diseases of esophagus: Secondary | ICD-10-CM | POA: Diagnosis not present

## 2018-05-16 DIAGNOSIS — K5730 Diverticulosis of large intestine without perforation or abscess without bleeding: Secondary | ICD-10-CM | POA: Diagnosis not present

## 2018-05-16 DIAGNOSIS — E1151 Type 2 diabetes mellitus with diabetic peripheral angiopathy without gangrene: Secondary | ICD-10-CM | POA: Diagnosis not present

## 2018-05-16 DIAGNOSIS — I2510 Atherosclerotic heart disease of native coronary artery without angina pectoris: Secondary | ICD-10-CM | POA: Diagnosis not present

## 2018-05-16 DIAGNOSIS — I10 Essential (primary) hypertension: Secondary | ICD-10-CM | POA: Diagnosis not present

## 2018-05-16 DIAGNOSIS — E1159 Type 2 diabetes mellitus with other circulatory complications: Secondary | ICD-10-CM | POA: Diagnosis not present

## 2018-05-16 DIAGNOSIS — B3781 Candidal esophagitis: Secondary | ICD-10-CM | POA: Diagnosis not present

## 2018-05-16 HISTORY — PX: BRONCHIAL BRUSHINGS: SHX5108

## 2018-05-16 HISTORY — PX: ESOPHAGOGASTRODUODENOSCOPY (EGD) WITH PROPOFOL: SHX5813

## 2018-05-16 HISTORY — PX: HOT HEMOSTASIS: SHX5433

## 2018-05-16 LAB — CBC
HCT: 30.9 % — ABNORMAL LOW (ref 39.0–52.0)
Hemoglobin: 8.9 g/dL — ABNORMAL LOW (ref 13.0–17.0)
MCH: 20.9 pg — ABNORMAL LOW (ref 26.0–34.0)
MCHC: 28.8 g/dL — ABNORMAL LOW (ref 30.0–36.0)
MCV: 72.7 fL — ABNORMAL LOW (ref 78.0–100.0)
Platelets: 192 10*3/uL (ref 150–400)
RBC: 4.25 MIL/uL (ref 4.22–5.81)
RDW: 19 % — ABNORMAL HIGH (ref 11.5–15.5)
WBC: 5.9 10*3/uL (ref 4.0–10.5)

## 2018-05-16 LAB — GLUCOSE, CAPILLARY
Glucose-Capillary: 163 mg/dL — ABNORMAL HIGH (ref 70–99)
Glucose-Capillary: 164 mg/dL — ABNORMAL HIGH (ref 70–99)
Glucose-Capillary: 150 mg/dL — ABNORMAL HIGH (ref 70–99)

## 2018-05-16 SURGERY — ESOPHAGOGASTRODUODENOSCOPY (EGD) WITH PROPOFOL
Anesthesia: Monitor Anesthesia Care

## 2018-05-16 MED ORDER — BISACODYL 5 MG PO TBEC
10.0000 mg | DELAYED_RELEASE_TABLET | Freq: Once | ORAL | Status: AC
  Administered 2018-05-16: 10 mg via ORAL
  Filled 2018-05-16: qty 2

## 2018-05-16 MED ORDER — PEG-KCL-NACL-NASULF-NA ASC-C 100 G PO SOLR
0.5000 | Freq: Once | ORAL | Status: AC
  Administered 2018-05-17: 100 g via ORAL
  Filled 2018-05-16: qty 1

## 2018-05-16 MED ORDER — SODIUM CHLORIDE 0.9 % IV SOLN
INTRAVENOUS | Status: DC | PRN
  Administered 2018-05-16: 09:00:00 via INTRAVENOUS

## 2018-05-16 MED ORDER — FLUCONAZOLE 100 MG PO TABS
100.0000 mg | ORAL_TABLET | Freq: Every day | ORAL | Status: DC
  Administered 2018-05-16 – 2018-05-17 (×2): 100 mg via ORAL
  Filled 2018-05-16 (×2): qty 1

## 2018-05-16 MED ORDER — PROPOFOL 500 MG/50ML IV EMUL
INTRAVENOUS | Status: DC | PRN
  Administered 2018-05-16: 75 ug/kg/min via INTRAVENOUS

## 2018-05-16 MED ORDER — PROPOFOL 10 MG/ML IV BOLUS
INTRAVENOUS | Status: DC | PRN
  Administered 2018-05-16: 10 mg via INTRAVENOUS
  Administered 2018-05-16: 20 mg via INTRAVENOUS
  Administered 2018-05-16: 10 mg via INTRAVENOUS

## 2018-05-16 MED ORDER — PEG-KCL-NACL-NASULF-NA ASC-C 100 G PO SOLR: 1.0000 | Freq: Once | ORAL | Status: DC

## 2018-05-16 MED ORDER — PEG-KCL-NACL-NASULF-NA ASC-C 100 G PO SOLR
0.5000 | Freq: Once | ORAL | Status: AC
  Administered 2018-05-16: 100 g via ORAL
  Filled 2018-05-16 (×2): qty 1

## 2018-05-16 SURGICAL SUPPLY — 15 items

## 2018-05-16 NOTE — Transfer of Care (Signed)
Immediate Anesthesia Transfer of Care Note  Patient: Paul Terrell  Procedure(s) Performed: ESOPHAGOGASTRODUODENOSCOPY (EGD) WITH PROPOFOL (N/A ) HOT HEMOSTASIS (ARGON PLASMA COAGULATION/BICAP) (N/A )  Patient Location: Endoscopy Unit  Anesthesia Type:MAC  Level of Consciousness: drowsy and patient cooperative  Airway & Oxygen Therapy: Patient Spontanous Breathing  Post-op Assessment: Report given to RN  Post vital signs: Reviewed and stable  Last Vitals:  Vitals Value Taken Time  BP    Temp    Pulse    Resp    SpO2      Last Pain:  Vitals:   05/16/18 0904  TempSrc: Oral  PainSc: 0-No pain         Complications: No apparent anesthesia complications

## 2018-05-16 NOTE — Anesthesia Preprocedure Evaluation (Addendum)
Anesthesia Evaluation  Patient identified by MRN, date of birth, ID band Patient awake    Reviewed: Allergy & Precautions, H&P , NPO status , Patient's Chart, lab work & pertinent test results  Airway Mallampati: II   Neck ROM: full    Dental  (+) Edentulous Upper, Edentulous Lower   Pulmonary former smoker,    breath sounds clear to auscultation       Cardiovascular hypertension, + CAD, + Cardiac Stents and + Peripheral Vascular Disease   Rhythm:regular Rate:Normal  Stress test (2012): no ischemia   Neuro/Psych    GI/Hepatic GERD  ,  Endo/Other  diabetes, Type 2  Renal/GU      Musculoskeletal  (+) Arthritis ,   Abdominal   Peds  Hematology  (+) anemia ,   Anesthesia Other Findings   Reproductive/Obstetrics                            Anesthesia Physical Anesthesia Plan  ASA: III  Anesthesia Plan: MAC   Post-op Pain Management:    Induction: Intravenous  PONV Risk Score and Plan: 1 and Propofol infusion and Treatment may vary due to age or medical condition  Airway Management Planned: Nasal Cannula  Additional Equipment:   Intra-op Plan:   Post-operative Plan:   Informed Consent: I have reviewed the patients History and Physical, chart, labs and discussed the procedure including the risks, benefits and alternatives for the proposed anesthesia with the patient or authorized representative who has indicated his/her understanding and acceptance.     Plan Discussed with: CRNA, Anesthesiologist and Surgeon  Anesthesia Plan Comments:         Anesthesia Quick Evaluation

## 2018-05-16 NOTE — Interval H&P Note (Signed)
History and Physical Interval Note:  05/16/2018 8:56 AM  Paul Terrell  has presented today for surgery, with the diagnosis of anemia.  dark stool recently but FOBT negative on 8/22  The various methods of treatment have been discussed with the patient and family. After consideration of risks, benefits and other options for treatment, the patient has consented to  Procedure(s): ESOPHAGOGASTRODUODENOSCOPY (EGD) WITH PROPOFOL (N/A) as a surgical intervention .  The patient's history has been reviewed, patient examined, no change in status, stable for surgery.  I have reviewed the patient's chart and labs.  Questions were answered to the patient's satisfaction.     [employer]

## 2018-05-16 NOTE — Anesthesia Procedure Notes (Signed)
Procedure Name: MAC Date/Time: 05/16/2018 9:21 AM Performed by: Barrington Ellison, CRNA Pre-anesthesia Checklist: Patient identified, Emergency Drugs available, Suction available, Patient being monitored and Timeout performed Patient Re-evaluated:Patient Re-evaluated prior to induction Oxygen Delivery Method: Nasal cannula

## 2018-05-16 NOTE — Op Note (Signed)
[facility] Patient Name: Paul Terrell Procedure Date : 05/16/2018 MRN: [id number] Attending MD: Justice Britain , MD Date of Birth: Sep 29, 1930 CSN: [id number] Age: 84 Admit Type: Inpatient Procedure:                Upper GI endoscopy Indications:              Iron deficiency anemia, Iron deficiency anemia due                            to suspected upper gastrointestinal bleeding,                            Melena, Occult blood in stool Providers:                Justice Britain, MD, Angus Seller, Nevin Bloodgood, Technician, Sampson Si, CRNA Referring MD:              Medicines:                Monitored Anesthesia Care Complications:            No immediate complications. Estimated Blood Loss:     Estimated blood loss was minimal. Procedure:                Pre-Anesthesia Assessment:                           - Prior to the procedure, a History and Physical                            was performed, and patient medications and                            allergies were reviewed. The patient's tolerance of                            previous anesthesia was also reviewed. The risks                            and benefits of the procedure and the sedation                            options and risks were discussed with the patient.                            All questions were answered, and informed consent                            was obtained. Prior Anticoagulants: The patient has                            taken aspirin. ASA Grade Assessment: II - A patient  with mild systemic disease. After reviewing the                            risks and benefits, the patient was deemed in                            satisfactory condition to undergo the procedure.                           After obtaining informed consent, the endoscope was                            passed under direct vision. Throughout the              procedure, the patient's blood pressure, pulse, and                            oxygen saturations were monitored continuously. The                            GIF-H190 ([id number] Olympus adult EGD was introduced                            through the mouth, and advanced to the third part                            of duodenum. The upper GI endoscopy was                            accomplished without difficulty. The patient                            tolerated the procedure well. Scope In: Scope Out: Findings:      White nummular lesions were noted in the entire esophagus. Cells for       cytology were obtained by brushing to rule out Candida.      Two small no bleeding angioectasias were found on the greater curvature       of the stomach. Fulguration to ablate the lesion by argon plasma was       successful.      Otherwise, no gross lesions were noted in the entire examined stomach.      No gross lesions were noted in the duodenal bulb, in the first portion       of the duodenum, in the second portion of the duodenum and in the third       portion of the duodenum. Impression:               - White nummular lesions in esophageal mucosa.                            Cells for cytology obtained.                           - Two non-bleeding angioectasias in the stomach.  Treated with argon plasma coagulation (APC).                           - No gross lesions in the stomach.                           - No gross lesions in the duodenal bulb, in the                            first portion of the duodenum, in the second                            portion of the duodenum and in the third portion of                            the duodenum. Recommendation:           - The patient will be observed post-procedure,                            until all discharge criteria are met.                           - Return patient to hospital ward for ongoing care.                            - Await pathology/cytology results. May consider                            Clotrimazole troche vs Nystatin Swish/Swallow while                            awaiting cytology.                           - Will discuss with patient consideration of                            Colonsocopy for further workup of microcytic anemia.                           - Would try to add-on Iron studies to original                            laboratories if possible.                           - The findings and recommendations were discussed                            with the patient.                           - The findings and recommendations were discussed  with the referring physician. Procedure Code(s):        --- Professional ---                           [phone], Esophagogastroduodenoscopy, flexible,                            transoral; with ablation of tumor(s), polyp(s), or                            other lesion(s) (includes pre- and post-dilation                            and guide wire passage, when performed) Diagnosis Code(s):        --- Professional ---                           K22.8, Other specified diseases of esophagus                           K31.819, Angiodysplasia of stomach and duodenum                            without bleeding                           D50.9, Iron deficiency anemia, unspecified                           K92.1, Melena (includes Hematochezia)                           R19.5, Other fecal abnormalities CPT copyright 2017 American Medical Association. All rights reserved. The codes documented in this report are preliminary and upon coder review may  be revised to meet current compliance requirements. Justice Britain, MD 05/16/2018 10:10:14 AM Number of Addenda: 0

## 2018-05-16 NOTE — Anesthesia Postprocedure Evaluation (Signed)
Anesthesia Post Note  Patient: LANSON RANDLE  Procedure(s) Performed: ESOPHAGOGASTRODUODENOSCOPY (EGD) WITH PROPOFOL Esophageal Brushing (N/A ) HOT HEMOSTASIS (ARGON PLASMA COAGULATION/BICAP) (N/A )     Patient location during evaluation: Endoscopy Anesthesia Type: MAC Level of consciousness: awake and alert Pain management: pain level controlled Vital Signs Assessment: post-procedure vital signs reviewed and stable Respiratory status: spontaneous breathing, nonlabored ventilation, respiratory function stable and patient connected to nasal cannula oxygen Cardiovascular status: blood pressure returned to baseline and stable Postop Assessment: no apparent nausea or vomiting Anesthetic complications: no    Last Vitals:  Vitals:   05/16/18 0946 05/16/18 0955  BP: (!) 138/56 (!) 147/58  Pulse: (!) 57 (!) 53  Resp: 14 17  Temp: 36.9 C   SpO2: 95% 96%    Last Pain:  Vitals:   05/16/18 0946  TempSrc: Oral  PainSc: 0-No pain                 Chance Karam S

## 2018-05-16 NOTE — Progress Notes (Signed)
PROGRESS NOTE        PATIENT DETAILS Name: Paul Terrell Age: 84 y.o. Sex: male Date of Birth: 01-May-1931 Admit Date: 05/14/2018 Admitting Physician Norval Morton, MD [id number], [name], MD  Brief Narrative: Patient is a 84 y.o. male with history of hypertension, dyslipidemia, history of CAD status post PCI, very hard of hearing sent from PCPs office for evaluation of symptomatic anemia (describes a few weeks history of exertional dyspnea).  Patient does acknowledge a few few intermittent episodes of dark stools a few weeks back.  Patient was subsequently admitted to the hospital service for further evaluation and treatment.  Subjective: Lying comfortably in bed-no chest pain or shortness of breath.  Assessment/Plan: Microcytic anemia: Patient was symptomatic, hence was transfused 1 unit of PRBC on admission.  Hemoglobin stable at 8.9.  On 8/24, showed 2 AVMs that were ablated-GI not very convinced that this was the source of recent bleeding.  Colonoscopy scheduled for 8/25.  Continue to follow CBC.  Recent GI bleeding: Does acknowledge history of intermittent dark stools a few weeks back, has new onset microcytic anemia-but no overt bleeding in the past few days.  FOBT negative as well.  EGD on 8/24 showed 2 AVMs that were ablated, GI not convinced that this was a source of recent bleeding, colonoscopy scheduled for tomorrow.  Candida esophagitis: Seen incidentally on EGD, starting fluconazole.  Await brushings.  DM-2: CBGs stable with SSI, resume oral hypoglycemic agents on discharge.    CAD: Denies chest pain, aspirin currently on hold due to suspicion for recent GI bleeding.  Apparently has a history of remote PCI in the past.  Hypertension: Better controlled this morning-continue metoprolol and lisinopril (dose increased to 10 mg on 8/23)  Dyslipidemia: Continue with statin.  BPH: Continue Flomax  DVT Prophylaxis: SCD's  Code Status: Full  code  Family Communication: None at bedside  Disposition Plan: Remain inpatient-home after GI evaluation has been completed.  Antimicrobial agents: Anti-infectives (From admission, onward)   Start     Dose/Rate Route Frequency Ordered Stop   05/16/18 1000  fluconazole (DIFLUCAN) tablet 100 mg     100 mg Oral Daily 05/16/18 0947        Procedures: None  CONSULTS:  GI  Time spent: 25- minutes-Greater than 50% of this time was spent in counseling, explanation of diagnosis, planning of further management, and coordination of care.  MEDICATIONS: Scheduled Meds: . finasteride  5 mg Oral QHS  . fluconazole  100 mg Oral Daily  . insulin aspart  0-5 Units Subcutaneous QHS  . insulin aspart  0-9 Units Subcutaneous TID WC  . lisinopril  10 mg Oral QHS  . metoprolol succinate  50 mg Oral Daily  . pantoprazole  40 mg Oral Daily  . pravastatin  20 mg Oral QHS  . tamsulosin  0.4 mg Oral QHS   Continuous Infusions: PRN Meds:.acetaminophen **OR** acetaminophen, albuterol, hydrALAZINE, ondansetron **OR** ondansetron (ZOFRAN) IV   PHYSICAL EXAM: Vital signs: Vitals:   05/16/18 0828 05/16/18 0904 05/16/18 0946 05/16/18 0955  BP: (!) 147/72 (!) 176/48 (!) 138/56 (!) 147/58  Pulse: (!) 57 (!) 57 (!) 57 (!) 53  Resp: 15 18 14 17   Temp: 97.8 F (36.6 C) 97.9 F (36.6 C) 98.4 F (36.9 C)   TempSrc: Oral Oral Oral   SpO2: 93% 95% 95% 96%  Weight:  Height:       Filed Weights   05/15/18 0027  Weight: 71.1 kg   Body mass index is 25.3 kg/m.   General appearance:Awake, alert, not in any distress.  Very hard of hearing. Eyes:no scleral icterus. HEENT: Atraumatic and Normocephalic Neck: supple, no JVD. Resp:Good air entry bilaterally,no rales or rhonchi CVS: S1 S2 regular  GI: Bowel sounds present, Non tender and not distended with no gaurding, rigidity or rebound. Extremities: B/L Lower Ext shows no edema, both legs are warm to touch Neurology:  Non focal Psychiatric:  Normal judgment and insight. Normal mood. Musculoskeletal:No digital cyanosis Skin:No Rash, warm and dry Wounds:N/A  I have personally reviewed following labs and imaging studies  LABORATORY DATA: CBC: Recent Labs  Lab 05/14/18 1005 05/14/18 1424 05/15/18 0304 05/16/18 0320  WBC 5.5 5.5 6.3 5.9  NEUTROABS 3.3  --   --   --   HGB 7.7 Repeated and verified X2.* 7.6* 8.3* 8.9*  HCT 25.1 Repeated and verified X2.* 27.9* 28.8* 30.9*  MCV 66.1 Repeated and verified X2.* 73.2* 72.5* 72.7*  PLT 178.0 214 190 315    Basic Metabolic Panel: Recent Labs  Lab 05/14/18 1424 05/15/18 0304  NA 137 138  K 4.3 3.8  CL 105 103  CO2 26 27  GLUCOSE 215* 128*  BUN 13 11  CREATININE 0.92 0.81  CALCIUM 9.1 9.1    GFR: Estimated Creatinine Clearance: 60.2 mL/min (by C-G formula based on SCr of 0.81 mg/dL).  Liver Function Tests: Recent Labs  Lab 05/14/18 1424  AST 27  ALT 24  ALKPHOS 50  BILITOT 0.6  PROT 6.2*  ALBUMIN 3.7   No results for input(s): LIPASE, AMYLASE in the last 168 hours. No results for input(s): AMMONIA in the last 168 hours.  Coagulation Profile: No results for input(s): INR, PROTIME in the last 168 hours.  Cardiac Enzymes: No results for input(s): CKTOTAL, CKMB, CKMBINDEX, TROPONINI in the last 168 hours.  BNP (last 3 results) No results for input(s): PROBNP in the last 8760 hours.  HbA1C: Recent Labs    05/14/18 0945  HGBA1C 6.9*    CBG: Recent Labs  Lab 05/15/18 0848 05/15/18 1205 05/15/18 1725 05/15/18 2136 05/16/18 0853  GLUCAP 123* 172* 106* 153* 163*    Lipid Profile: No results for input(s): CHOL, HDL, LDLCALC, TRIG, CHOLHDL, LDLDIRECT in the last 72 hours.  Thyroid Function Tests: No results for input(s): TSH, T4TOTAL, FREET4, T3FREE, THYROIDAB in the last 72 hours.  Anemia Panel: No results for input(s): VITAMINB12, FOLATE, FERRITIN, TIBC, IRON, RETICCTPCT in the last 72 hours.  Urine analysis: No results found for:  COLORURINE, APPEARANCEUR, LABSPEC, PHURINE, GLUCOSEU, HGBUR, BILIRUBINUR, KETONESUR, PROTEINUR, UROBILINOGEN, NITRITE, LEUKOCYTESUR  Sepsis Labs: Lactic Acid, Venous No results found for: LATICACIDVEN  MICROBIOLOGY: No results found for this or any previous visit (from the past 240 hour(s)).  RADIOLOGY STUDIES/RESULTS: Dg Foot 2 Views Left  Result Date: 05/01/2018 Please see detailed radiograph report in office note.  Dg Foot Complete Right  Result Date: 05/01/2018 Please see detailed radiograph report in office note.    LOS: 0 days   Oren Binet, MD  [facility]  If 7PM-7AM, please contact night-coverage  Please page via [email or web address]-click on MD name and type text message  05/16/2018, 10:34 AM

## 2018-05-17 ENCOUNTER — Observation Stay (HOSPITAL_COMMUNITY): Payer: Medicare Other | Admitting: Anesthesiology

## 2018-05-17 ENCOUNTER — Encounter (HOSPITAL_COMMUNITY)
Admission: EM | Disposition: A | Discharge status: Home / Self Care | Payer: Self-pay | Source: Home / Self Care | Attending: Emergency Medicine

## 2018-05-17 ENCOUNTER — Encounter (HOSPITAL_COMMUNITY): Payer: Self-pay

## 2018-05-17 DIAGNOSIS — K5140 Inflammatory polyps of colon without complications: Secondary | ICD-10-CM | POA: Diagnosis not present

## 2018-05-17 DIAGNOSIS — K5730 Diverticulosis of large intestine without perforation or abscess without bleeding: Secondary | ICD-10-CM

## 2018-05-17 DIAGNOSIS — I2510 Atherosclerotic heart disease of native coronary artery without angina pectoris: Secondary | ICD-10-CM | POA: Diagnosis not present

## 2018-05-17 DIAGNOSIS — D122 Benign neoplasm of ascending colon: Secondary | ICD-10-CM

## 2018-05-17 DIAGNOSIS — D649 Anemia, unspecified: Secondary | ICD-10-CM | POA: Diagnosis not present

## 2018-05-17 DIAGNOSIS — D509 Iron deficiency anemia, unspecified: Secondary | ICD-10-CM | POA: Diagnosis not present

## 2018-05-17 DIAGNOSIS — K921 Melena: Secondary | ICD-10-CM | POA: Diagnosis not present

## 2018-05-17 DIAGNOSIS — K644 Residual hemorrhoidal skin tags: Secondary | ICD-10-CM | POA: Diagnosis not present

## 2018-05-17 DIAGNOSIS — I10 Essential (primary) hypertension: Secondary | ICD-10-CM | POA: Diagnosis not present

## 2018-05-17 DIAGNOSIS — E1142 Type 2 diabetes mellitus with diabetic polyneuropathy: Secondary | ICD-10-CM | POA: Diagnosis not present

## 2018-05-17 DIAGNOSIS — B379 Candidiasis, unspecified: Secondary | ICD-10-CM | POA: Diagnosis not present

## 2018-05-17 DIAGNOSIS — K31819 Angiodysplasia of stomach and duodenum without bleeding: Secondary | ICD-10-CM | POA: Diagnosis not present

## 2018-05-17 DIAGNOSIS — K640 First degree hemorrhoids: Secondary | ICD-10-CM | POA: Diagnosis not present

## 2018-05-17 DIAGNOSIS — E1159 Type 2 diabetes mellitus with other circulatory complications: Secondary | ICD-10-CM | POA: Diagnosis not present

## 2018-05-17 DIAGNOSIS — E1151 Type 2 diabetes mellitus with diabetic peripheral angiopathy without gangrene: Secondary | ICD-10-CM | POA: Diagnosis not present

## 2018-05-17 DIAGNOSIS — B3781 Candidal esophagitis: Secondary | ICD-10-CM | POA: Diagnosis not present

## 2018-05-17 HISTORY — PX: POLYPECTOMY: SHX5525

## 2018-05-17 HISTORY — PX: COLONOSCOPY: SHX5424

## 2018-05-17 LAB — GLUCOSE, CAPILLARY
Glucose-Capillary: 86 mg/dL (ref 70–99)
Glucose-Capillary: 149 mg/dL — ABNORMAL HIGH (ref 70–99)
Glucose-Capillary: 123 mg/dL — ABNORMAL HIGH (ref 70–99)

## 2018-05-17 LAB — CBC
HCT: 36.1 % — ABNORMAL LOW (ref 39.0–52.0)
Hemoglobin: 10.3 g/dL — ABNORMAL LOW (ref 13.0–17.0)
MCH: 20.7 pg — ABNORMAL LOW (ref 26.0–34.0)
MCHC: 28.5 g/dL — ABNORMAL LOW (ref 30.0–36.0)
MCV: 72.6 fL — ABNORMAL LOW (ref 78.0–100.0)
Platelets: 224 10*3/uL (ref 150–400)
RBC: 4.97 MIL/uL (ref 4.22–5.81)
RDW: 19.6 % — ABNORMAL HIGH (ref 11.5–15.5)
WBC: 7 10*3/uL (ref 4.0–10.5)

## 2018-05-17 SURGERY — COLONOSCOPY
Anesthesia: Monitor Anesthesia Care

## 2018-05-17 MED ORDER — PROPOFOL 10 MG/ML IV BOLUS
INTRAVENOUS | Status: DC | PRN
  Administered 2018-05-17: 20 mg via INTRAVENOUS
  Administered 2018-05-17: 10 mg via INTRAVENOUS

## 2018-05-17 MED ORDER — HYDRALAZINE HCL 20 MG/ML IJ SOLN
INTRAMUSCULAR | Status: AC
  Filled 2018-05-17: qty 1

## 2018-05-17 MED ORDER — PANTOPRAZOLE SODIUM 40 MG PO TBEC: 40.0000 mg | DELAYED_RELEASE_TABLET | Freq: Every day | ORAL | 0 refills | Status: DC

## 2018-05-17 MED ORDER — FLUCONAZOLE 100 MG PO TABS: 100.0000 mg | ORAL_TABLET | Freq: Every day | ORAL | 0 refills | Status: AC

## 2018-05-17 MED ORDER — PROPOFOL 500 MG/50ML IV EMUL
INTRAVENOUS | Status: DC | PRN
  Administered 2018-05-17: 75 ug/kg/min via INTRAVENOUS

## 2018-05-17 MED ORDER — FERROUS SULFATE 325 (65 FE) MG PO TABS: 325.0000 mg | ORAL_TABLET | Freq: Two times a day (BID) | ORAL | Status: DC

## 2018-05-17 MED ORDER — FERROUS SULFATE 325 (65 FE) MG PO TABS: 325.0000 mg | ORAL_TABLET | Freq: Two times a day (BID) | ORAL | 0 refills | Status: DC

## 2018-05-17 MED ORDER — LACTATED RINGERS IV SOLN
INTRAVENOUS | Status: DC | PRN
  Administered 2018-05-17: 13:00:00 via INTRAVENOUS

## 2018-05-17 MED ORDER — LISINOPRIL 10 MG PO TABS: 10.0000 mg | ORAL_TABLET | Freq: Every day | ORAL | 0 refills | Status: DC

## 2018-05-17 NOTE — Transfer of Care (Signed)
Immediate Anesthesia Transfer of Care Note  Patient: Paul Terrell  Procedure(s) Performed: COLONOSCOPY (N/A )  Patient Location: Endoscopy Unit  Anesthesia Type:MAC  Level of Consciousness: drowsy  Airway & Oxygen Therapy: Patient Spontanous Breathing and Patient connected to nasal cannula oxygen  Post-op Assessment: Report given to RN, Post -op Vital signs reviewed and stable and Patient moving all extremities  Post vital signs: Reviewed and stable  Last Vitals:  Vitals Value Taken Time  BP 139/53 05/17/2018  1:32 PM  Temp    Pulse 51 05/17/2018  1:32 PM  Resp 27 05/17/2018  1:32 PM  SpO2 100 % 05/17/2018  1:32 PM  Vitals shown include unvalidated device data.  Last Pain:  Vitals:   05/17/18 1332  TempSrc: Oral  PainSc:          Complications: No apparent anesthesia complications

## 2018-05-17 NOTE — Op Note (Signed)
[facility] Patient Name: Paul Terrell Procedure Date : 05/17/2018 MRN: [id number] Attending MD: Justice Britain , MD Date of Birth: 01/21/1931 CSN: [id number] Age: 84 Admit Type: Inpatient Procedure:                Colonoscopy Indications:              Iron deficiency anemia (Presumed due to                            microcytosis - Iron labs could not be drawn before                            he received blood transfusion) Providers:                Justice Britain, MD, Burtis Junes, RN, Nevin Bloodgood, Technician, Claybon Jabs CRNA, CRNA Referring MD:             Azucena Freed PA, PA, Dr. Evalee Mutton, Milus Banister, MD, [name]. [name], MD Medicines:                Monitored Anesthesia Care Complications:            No immediate complications. Estimated Blood Loss:     Estimated blood loss was minimal. Procedure:                Pre-Anesthesia Assessment:                           - Prior to the procedure, a History and Physical                            was performed, and patient medications and                            allergies were reviewed. The patient's tolerance of                            previous anesthesia was also reviewed. The risks                            and benefits of the procedure and the sedation                            options and risks were discussed with the patient.                            All questions were answered, and informed consent                            was obtained. Prior Anticoagulants: The patient has  taken aspirin, last dose was 3 days prior to                            procedure. ASA Grade Assessment: III - A patient                            with severe systemic disease. After reviewing the                            risks and benefits, the patient was deemed in                            satisfactory condition to undergo the  procedure.                           After obtaining informed consent, the colonoscope                            was passed under direct vision. Throughout the                            procedure, the patient's blood pressure, pulse, and                            oxygen saturations were monitored continuously. The                            CF-HQ190L ([id number]) Olympus adult colon was                            introduced through the anus and advanced to the 8                            cm into the ileum. The colonoscopy was performed                            without difficulty. The patient tolerated the                            procedure well. The quality of the bowel                            preparation was fair. Scope In: 17:91:50 PM Scope Out: 1:24:43 PM Scope Withdrawal Time: 0 hours 21 minutes 49 seconds  Total Procedure Duration: 0 hours 27 minutes 46 seconds  Findings:      The digital rectal exam findings include non-thrombosed external       hemorrhoids. Pertinent negatives include no palpable rectal lesions.      The terminal ileum and ileocecal valve appeared normal.      A moderate amount of stool was found in the recto-sigmoid colon, in the       sigmoid colon, in the descending colon, at the splenic flexure and in       the transverse colon, interfering with visualization. Lavage of  the area       was performed using copious amounts, resulting in clearance with fair       visualization.      Two sessile polyps were found in the ascending colon. The polyps were 2       to 5 mm in size. These polyps were removed with a cold snare. Resection       and retrieval were complete.      Many small and large-mouthed diverticula were found in the recto-sigmoid       colon, sigmoid colon and descending colon.      Non-bleeding non-thrombosed external and internal hemorrhoids were found       during retroflexion, during perianal exam and during digital exam. The        hemorrhoids were Grade I (internal hemorrhoids that do not prolapse). Impression:               - Preparation of the colon was fair.                           - Non-thrombosed external hemorrhoids found on                            digital rectal exam.                           - The examined portion of the ileum was normal.                           - Stool in the recto-sigmoid colon, in the sigmoid                            colon, in the descending colon, at the splenic                            flexure and in the transverse colon.                           - Two 2 to 5 mm polyps in the ascending colon,                            removed with a cold snare. Resected and retrieved.                           - Diverticulosis in the recto-sigmoid colon, in the                            sigmoid colon and in the descending colon.                           - Non-bleeding non-thrombosed external and internal                            hemorrhoids. Recommendation:           - The patient will be observed post-procedure,  until all discharge criteria are met.                           - Return patient to hospital ward for ongoing care.                           - Await pathology results. Though repeat                            colonoscopies for surveillance of prior polyps if                            this returns as adenomatous would not be                            recommended at his age and with his medical                            comorbidities.                           - The patient does not have current evidence of                            ongoing GI bleeding. At this junction, the patient                            should be placed on twice daily Oral Iron                            supplementation and be monitored closely with his                            PCP. If the patient has recurrent, documented, IDA                            while on Iron  supplementation, then consideration                            of the role of VCE to evaluate if the patient has                            any further Angioectasias in the small bowel may                            not be unreasonable, though with his age and                            medical comorbidities, it would have to be a                            significant consideration/discussion.                           -  The findings and recommendations were discussed                            with the patient.                           - The findings and recommendations were discussed                            with the patient's family.                           - The findings and recommendations were discussed                            with the referring physician. Procedure Code(s):        --- Professional ---                           [phone], Colonoscopy, flexible; with removal of                            tumor(s), polyp(s), or other lesion(s) by snare                            technique Diagnosis Code(s):        --- Professional ---                           K64.0, First degree hemorrhoids                           K64.4, Residual hemorrhoidal skin tags                           D12.2, Benign neoplasm of ascending colon                           D50.9, Iron deficiency anemia, unspecified                           K57.30, Diverticulosis of large intestine without                            perforation or abscess without bleeding CPT copyright 2017 [employer] Medical [employer]. All rights reserved. The codes documented in this report are preliminary and upon coder review may  be revised to meet current compliance requirements. Justice Britain, MD 05/17/2018 2:36:58 PM Number of Addenda: 0

## 2018-05-17 NOTE — Anesthesia Preprocedure Evaluation (Signed)
Anesthesia Evaluation  Patient identified by MRN, date of birth, ID band Patient awake    Reviewed: Allergy & Precautions, NPO status , Patient's Chart, lab work & pertinent test results  Airway Mallampati: II  TM Distance: >3 FB Neck ROM: Full    Dental   Pulmonary former smoker,    Pulmonary exam normal        Cardiovascular hypertension, Pt. on medications + CAD and + Cardiac Stents  Normal cardiovascular exam     Neuro/Psych    GI/Hepatic GERD  Medicated and Controlled,  Endo/Other  diabetes, Type 2, Oral Hypoglycemic Agents  Renal/GU      Musculoskeletal   Abdominal   Peds  Hematology   Anesthesia Other Findings   Reproductive/Obstetrics                             Anesthesia Physical Anesthesia Plan  ASA: III  Anesthesia Plan: MAC   Post-op Pain Management:    Induction: Intravenous  PONV Risk Score and Plan: 1 and Ondansetron and Treatment may vary due to age or medical condition  Airway Management Planned: Simple Face Mask  Additional Equipment:   Intra-op Plan:   Post-operative Plan:   Informed Consent: I have reviewed the patients History and Physical, chart, labs and discussed the procedure including the risks, benefits and alternatives for the proposed anesthesia with the patient or authorized representative who has indicated his/her understanding and acceptance.     Plan Discussed with: CRNA and Surgeon  Anesthesia Plan Comments:         Anesthesia Quick Evaluation

## 2018-05-17 NOTE — Interval H&P Note (Signed)
History and Physical Interval Note:  05/17/2018 12:31 PM  Paul Terrell  has presented today for surgery, with the diagnosis of microcytic anemia  The various methods of treatment have been discussed with the patient and family. After consideration of risks, benefits and other options for treatment, the patient has consented to  Procedure(s): COLONOSCOPY (N/A) as a surgical intervention .  The patient's history has been reviewed, patient examined, no change in status, stable for surgery.  I have reviewed the patient's chart and labs.  Questions were answered to the patient's satisfaction.     [employer]

## 2018-05-17 NOTE — Anesthesia Postprocedure Evaluation (Signed)
Anesthesia Post Note  Patient: Paul Terrell  Procedure(s) Performed: COLONOSCOPY (N/A )     Patient location during evaluation: PACU Anesthesia Type: MAC Level of consciousness: awake and alert Pain management: pain level controlled Vital Signs Assessment: post-procedure vital signs reviewed and stable Respiratory status: spontaneous breathing, nonlabored ventilation, respiratory function stable and patient connected to nasal cannula oxygen Cardiovascular status: stable and blood pressure returned to baseline Postop Assessment: no apparent nausea or vomiting Anesthetic complications: no    Last Vitals:  Vitals:   05/17/18 1350 05/17/18 1400  BP: (!) 205/54   Pulse: (!) 53 (!) 52  Resp: 17 17  Temp:    SpO2: 98% 98%    Last Pain:  Vitals:   05/17/18 1350  TempSrc:   PainSc: 0-No pain                 Bannon Giammarco DAVID

## 2018-05-17 NOTE — Discharge Summary (Signed)
PATIENT DETAILS Name: Paul Terrell Age: 84 y.o. Sex: male Date of Birth: 27-Oct-1930 MRN: [id number]. Admitting Physician: Norval Morton, MD GGE:ZMOQHU, [name], MD  Admit Date: 05/14/2018 Discharge date: 05/17/2018  Recommendations for Outpatient Follow-up:  1. Follow up with PCP in 1-2 weeks 2. Please obtain BMP/CBC in one week 3. Please follow up on the following pending results:Colonoscopy bx results  Admitted From:  Home  Disposition: [place]: No  Equipment/Devices: None  Discharge Condition: Stable  CODE STATUS: FULL COD  Diet recommendation:  Heart Healthy / Carb Modified   Brief Summary: See H&P, Labs, Consult and Test reports for all details in brief,Patient is a 84 y.o. male with history of hypertension, dyslipidemia, history of CAD status post PCI, very hard of hearing sent from PCPs office for evaluation of symptomatic anemia (describes a few weeks history of exertional dyspnea).  Patient does acknowledge a few few intermittent episodes of dark stools a few weeks back.  Patient was subsequently admitted to the hospital service for further evaluation and treatment  Brief Hospital Course: Microcytic anemia: Patient was symptomatic, hence was transfused 1 unit of PRBC on admission.  Hemoglobin stable at 8.9.  On 8/24, showed 2 AVMs that were ablated-GI not very convinced that this was the source of recent bleeding. Colonoscopy 8/25 showed 2 polyps, diverticulosis, and external hemorrhoids but without any evidence of active bleeding.  No further recommendations from gastroenterology-begin iron supplementation-if still anemic in the next few weeks, may need outpatient capsule endoscopy.  Follow CBC closely in the outpatient setting.  Recent GI bleeding: Does acknowledge history of intermittent dark stools a few weeks back, has new onset microcytic anemia-but no overt bleeding in the past few days.  FOBT negative as well.  EGD on 8/24 showed 2 AVMs that  were ablated, GI not convinced that this was a source of recent bleeding, colonoscopy subsequently completed on 8/25-see above.  Being discharged on iron supplementation.  Candida esophagitis: Seen incidentally on EGD, continue fluconazole.  DM-2: CBGs stable with SSI, resume oral hypoglycemic agents on discharge.    CAD: Denies chest pain, aspirin old due to suspicion for recent GI bleeding.  Apparently has a history of remote PCI in the past.  Spoke with GI-okay to resume aspirin.  Hypertension: Better controlled this morning-continue metoprolol and lisinopril (dose increased to 10 mg on 8/23) optimize accordingly in the outpatient setting.  Dyslipidemia: Continue with statin.  BPH: Continue Flomax  Procedures/Studies: 8/24>> EGD 8/25>> colonoscopy  Discharge Diagnoses:  Principal Problem:   Symptomatic anemia Active Problems:   Type 2 diabetes mellitus with vascular disease (HCC)   CAD (coronary artery disease)   BPH (benign prostatic hyperplasia)   Essential hypertension   Discharge Instructions:  Activity:  As tolerated with Full fall precautions use walker/cane & assistance as needed   Discharge Instructions    Diet - low sodium heart healthy   Complete by:  As directed    Diet Carb Modified   Complete by:  As directed    Discharge instructions   Complete by:  As directed    Follow with Primary MD  Tonia Ghent, MD in 1 week  Follow with GI MD Dr Ardis Hughs in 2-3 weeks  Please get a complete blood count and chemistry panel checked by your Primary MD at your next visit, and again as instructed by your Primary MD.  Get Medicines reviewed and adjusted: Please take all your medications with you for your next  visit with your Primary MD  Laboratory/radiological data: Please request your Primary MD to go over all hospital tests and procedure/radiological results at the follow up, please ask your Primary MD to get all Hospital records sent to his/her  office.  In some cases, they will be blood work, cultures and biopsy results pending at the time of your discharge. Please request that your primary care M.D. follows up on these results.  Also Note the following: If you experience worsening of your admission symptoms, develop shortness of breath, life threatening emergency, suicidal or homicidal thoughts you must seek medical attention immediately by calling 911 or calling your MD immediately  if symptoms less severe.  You must read complete instructions/literature along with all the possible adverse reactions/side effects for all the Medicines you take and that have been prescribed to you. Take any new Medicines after you have completely understood and accpet all the possible adverse reactions/side effects.   Do not drive when taking Pain medications or sleeping medications (Benzodaizepines)  Do not take more than prescribed Pain, Sleep and Anxiety Medications. It is not advisable to combine anxiety,sleep and pain medications without talking with your primary care practitioner  Special Instructions: If you have smoked or chewed Tobacco  in the last 2 yrs please stop smoking, stop any regular Alcohol  and or any Recreational drug use.  Wear Seat belts while driving.  Please note: You were cared for by a hospitalist during your hospital stay. Once you are discharged, your primary care physician will handle any further medical issues. Please note that NO REFILLS for any discharge medications will be authorized once you are discharged, as it is imperative that you return to your primary care physician (or establish a relationship with a primary care physician if you do not have one) for your post hospital discharge needs so that they can reassess your need for medications and monitor your lab values.   Increase activity slowly   Complete by:  As directed      Allergies as of 05/17/2018      Reactions   Nsaids Other (See Comments)   H/o anemia    Citrus Hives   Simvastatin Other (See Comments)   Myalgia      Medication List    TAKE these medications   acetaminophen 500 MG tablet Commonly known as:  TYLENOL Take 500 mg by mouth every 6 (six) hours as needed (for pain or headaches).   aspirin 81 MG tablet Take 81 mg by mouth at bedtime.   ferrous sulfate 325 (65 FE) MG tablet Take 1 tablet (325 mg total) by mouth 2 (two) times daily with a meal.   finasteride 5 MG tablet Commonly known as:  PROSCAR TAKE 1 TABLET (5 MG TOTAL) BY MOUTH DAILY. What changed:  when to take this   fluconazole 100 MG tablet Commonly known as:  DIFLUCAN Take 1 tablet (100 mg total) by mouth daily for 7 days. Start taking on:  05/18/2018   glyBURIDE 5 MG tablet Commonly known as:  DIABETA TAKE 1 TABLET EVERY MORNING.   lisinopril 10 MG tablet Commonly known as:  PRINIVIL,ZESTRIL Take 1 tablet (10 mg total) by mouth daily. What changed:    medication strength  how much to take   metFORMIN 1000 MG tablet Commonly known as:  GLUCOPHAGE TAKE 1 TABLET TWICE DAILY   metoprolol succinate 50 MG 24 hr tablet Commonly known as:  TOPROL-XL TAKE 1 TABLET EVERY DAY  WITH  OR  FOLLOWING  A  MEAL What changed:  See the new instructions.   multivitamin tablet Take 1 tablet by mouth daily.   ONE TOUCH ULTRA TEST test strip Generic drug:  glucose blood TEST BLOOD SUGAR ONCE DAILY. DX: 250.00 What changed:  See the new instructions.   onetouch ultrasoft lancets Test once daily Dx 250.00   pantoprazole 40 MG tablet Commonly known as:  PROTONIX Take 1 tablet (40 mg total) by mouth daily. Start taking on:  05/18/2018   pravastatin 20 MG tablet Commonly known as:  PRAVACHOL TAKE 1 TABLET EVERY DAY What changed:  when to take this   tamsulosin 0.4 MG Caps capsule Commonly known as:  FLOMAX TAKE 1 CAPSULE (0.4 MG TOTAL) BY MOUTH DAILY. What changed:  when to take this   vitamin B-12 250 MCG tablet Commonly known as:   CYANOCOBALAMIN Take 250 mcg by mouth 2 (two) times daily.   Vitamin D-3 5000 units Tabs Take 5,000 Units by mouth daily.      Follow-up Information    Tonia Ghent, MD. Schedule an appointment as soon as possible for a visit in 1 week(s).   Specialty:  Family Medicine Contact information: [place] [address] [phone]        Milus Banister, MD. Schedule an appointment as soon as possible for a visit.   Specialty:  Gastroenterology Why:  as needed in the next 2-3 weeks Contact information: [address] [address] [phone]          Allergies  Allergen Reactions  . Nsaids Other (See Comments)    H/o anemia  . Citrus Hives  . Simvastatin Other (See Comments)    Myalgia     Consultations:   GI   Other Procedures/Studies: Dg Foot 2 Views Left  Result Date: 05/01/2018 Please see detailed radiograph report in office note.  Dg Foot Complete Right  Result Date: 05/01/2018 Please see detailed radiograph report in office note.    TODAY-DAY OF DISCHARGE:  Subjective:   Paul Terrell today has no headache,no chest abdominal pain,no new weakness tingling or numbness, feels much better wants to go home today.   Objective:   Blood pressure (!) 164/59, pulse (!) 55, temperature 98 F (36.7 C), temperature source Axillary, resp. rate 18, height 5\' 6"  (1.676 m), weight 71.1 kg, SpO2 100 %. No intake or output data in the 24 hours ending 05/17/18 1453 Filed Weights   05/15/18 0027  Weight: 71.1 kg    Exam: Awake Alert, Oriented *3, No new F.N deficits, Normal affect .AT,PERRAL Supple Neck,No JVD, No cervical lymphadenopathy appriciated.  Symmetrical Chest wall movement, Good air movement bilaterally, CTAB RRR,No Gallops,Rubs or new Murmurs, No Parasternal Heave +ve B.Sounds, Abd Soft, Non tender, No organomegaly appriciated, No rebound -guarding or rigidity. No Cyanosis, Clubbing or edema, No new Rash or  bruise   PERTINENT RADIOLOGIC STUDIES: Dg Foot 2 Views Left  Result Date: 05/01/2018 Please see detailed radiograph report in office note.  Dg Foot Complete Right  Result Date: 05/01/2018 Please see detailed radiograph report in office note.    PERTINENT LAB RESULTS: CBC: Recent Labs    05/16/18 0320 05/17/18 0305  WBC 5.9 7.0  HGB 8.9* 10.3*  HCT 30.9* 36.1*  PLT 192 224   CMET CMP     Component Value Date/Time   NA 138 05/15/2018 0304   K 3.8 05/15/2018 0304   CL 103 05/15/2018 0304   CO2 27 05/15/2018 0304  GLUCOSE 128 (H) 05/15/2018 0304   BUN 11 05/15/2018 0304   CREATININE 0.81 05/15/2018 0304   CALCIUM 9.1 05/15/2018 0304   PROT 6.2 (L) 05/14/2018 1424   ALBUMIN 3.7 05/14/2018 1424   AST 27 05/14/2018 1424   ALT 24 05/14/2018 1424   ALKPHOS 50 05/14/2018 1424   BILITOT 0.6 05/14/2018 1424   GFRNONAA >60 05/15/2018 0304   GFRAA >60 05/15/2018 0304    GFR Estimated Creatinine Clearance: 60.2 mL/min (by C-G formula based on SCr of 0.81 mg/dL). No results for input(s): LIPASE, AMYLASE in the last 72 hours. No results for input(s): CKTOTAL, CKMB, CKMBINDEX, TROPONINI in the last 72 hours. Invalid input(s): POCBNP No results for input(s): DDIMER in the last 72 hours. No results for input(s): HGBA1C in the last 72 hours. No results for input(s): CHOL, HDL, LDLCALC, TRIG, CHOLHDL, LDLDIRECT in the last 72 hours. No results for input(s): TSH, T4TOTAL, T3FREE, THYROIDAB in the last 72 hours.  Invalid input(s): FREET3 No results for input(s): VITAMINB12, FOLATE, FERRITIN, TIBC, IRON, RETICCTPCT in the last 72 hours. Coags: No results for input(s): INR in the last 72 hours.  Invalid input(s): PT Microbiology: No results found for this or any previous visit (from the past 240 hour(s)).  FURTHER DISCHARGE INSTRUCTIONS:  Get Medicines reviewed and adjusted: Please take all your medications with you for your next visit with your Primary  MD  Laboratory/radiological data: Please request your Primary MD to go over all hospital tests and procedure/radiological results at the follow up, please ask your Primary MD to get all Hospital records sent to his/her office.  In some cases, they will be blood work, cultures and biopsy results pending at the time of your discharge. Please request that your primary care M.D. goes through all the records of your hospital data and follows up on these results.  Also Note the following: If you experience worsening of your admission symptoms, develop shortness of breath, life threatening emergency, suicidal or homicidal thoughts you must seek medical attention immediately by calling 911 or calling your MD immediately  if symptoms less severe.  You must read complete instructions/literature along with all the possible adverse reactions/side effects for all the Medicines you take and that have been prescribed to you. Take any new Medicines after you have completely understood and accpet all the possible adverse reactions/side effects.   Do not drive when taking Pain medications or sleeping medications (Benzodaizepines)  Do not take more than prescribed Pain, Sleep and Anxiety Medications. It is not advisable to combine anxiety,sleep and pain medications without talking with your primary care practitioner  Special Instructions: If you have smoked or chewed Tobacco  in the last 2 yrs please stop smoking, stop any regular Alcohol  and or any Recreational drug use.  Wear Seat belts while driving.  Please note: You were cared for by a hospitalist during your hospital stay. Once you are discharged, your primary care physician will handle any further medical issues. Please note that NO REFILLS for any discharge medications will be authorized once you are discharged, as it is imperative that you return to your primary care physician (or establish a relationship with a primary care physician if you do not have  one) for your post hospital discharge needs so that they can reassess your need for medications and monitor your lab values.  Total Time spent coordinating discharge including counseling, education and face to face time equals  45 minutes.  SignedOren Binet 05/17/2018 2:53 PM

## 2018-05-17 NOTE — Assessment & Plan Note (Signed)
A1c was lower but his fasting blood sugars have been around 150-200.  This prompted me to check CBC at the office visit.  Discussed with patient about rationale.  See after visit summary.  CBC resulted, unexpected anemia noted.  Patient was advised to go to the emergency room.  He was admitted.  Inpatient notes reviewed. >25 minutes spent in face to face time with patient, >50% spent in counselling or coordination of care.

## 2018-05-18 ENCOUNTER — Telehealth: Payer: Self-pay | Admitting: *Deleted

## 2018-05-18 ENCOUNTER — Telehealth: Payer: Self-pay | Admitting: Family Medicine

## 2018-05-18 LAB — BPAM RBC
Blood Product Expiration Date: 201909132359
Blood Product Expiration Date: 201909172359
ISSUE DATE / TIME: 201908221757
Unit Type and Rh: 5100
Unit Type and Rh: 5100

## 2018-05-18 LAB — TYPE AND SCREEN
ABO/RH(D): O POS
Antibody Screen: NEGATIVE
Unit division: 0
Unit division: 0

## 2018-05-18 SURGERY — COLONOSCOPY
Anesthesia: Moderate Sedation

## 2018-05-18 NOTE — Telephone Encounter (Addendum)
What about the 12:30 on the 30th?

## 2018-05-18 NOTE — Telephone Encounter (Signed)
Patient can come on 8/30 at 12:15.  Thank you.

## 2018-05-18 NOTE — Telephone Encounter (Signed)
Lm requesting return call to complete TCM and confirm hosp f/u appt  

## 2018-05-18 NOTE — Telephone Encounter (Signed)
See below CRM   Copied from [place] [id number]. Topic: Appointment Scheduling - Scheduling Inquiry for Clinic >> May 18, 2018 10:38 AM Scherrie Gerlach wrote: Reason for CRM: pt needs post hosp fup, was discharged Sat. (with in a week) first available 30 min was 05/28/18 at 2 pm. That is first appt in the afternoon.  Is that ok? No other 30 min appts! Thanks!

## 2018-05-18 NOTE — Telephone Encounter (Signed)
Left message asking pt to call office see if pt can come in 8/30 @ 12:15

## 2018-05-19 ENCOUNTER — Encounter (HOSPITAL_COMMUNITY): Payer: Self-pay | Admitting: Gastroenterology

## 2018-05-19 NOTE — Telephone Encounter (Signed)
Lm requesting return call to complete TCM and confirm hosp f/u appt  

## 2018-05-21 ENCOUNTER — Encounter: Payer: Self-pay | Admitting: Gastroenterology

## 2018-05-21 ENCOUNTER — Telehealth: Payer: Self-pay

## 2018-05-21 MED ORDER — FLUCONAZOLE 100 MG PO TABS: ORAL_TABLET | ORAL | 0 refills | Status: DC

## 2018-05-21 NOTE — Telephone Encounter (Signed)
-----   Message from Irving Copas., MD sent at 05/21/2018  4:25 PM EDT ----- Regarding: Pathology Follow up and Treatment Paul Terrell, This patient will need Fluconazole treatment for candida esophagitis. Can you reach out to him and get it started as outlined in the letter (200 mg Day 1 and then 100 mg Day 2-14). You can send me the Rx and I can co-sign as able. I have placed his PCP, Dr. [name] on here, so please let us know if you have any issues with getting this set up. Thank you as always.  Chester Holstein

## 2018-05-21 NOTE — Telephone Encounter (Signed)
Left message on machine to call back  

## 2018-05-22 ENCOUNTER — Ambulatory Visit (INDEPENDENT_AMBULATORY_CARE_PROVIDER_SITE_OTHER): Payer: Medicare Other | Admitting: Family Medicine

## 2018-05-22 ENCOUNTER — Encounter: Payer: Self-pay | Admitting: Family Medicine

## 2018-05-22 VITALS — BP 154/62 | HR 65 | Temp 98.4°F | Ht 66.0 in | Wt 158.5 lb

## 2018-05-22 DIAGNOSIS — D649 Anemia, unspecified: Secondary | ICD-10-CM | POA: Diagnosis not present

## 2018-05-22 DIAGNOSIS — E119 Type 2 diabetes mellitus without complications: Secondary | ICD-10-CM | POA: Diagnosis not present

## 2018-05-22 DIAGNOSIS — B3781 Candidal esophagitis: Secondary | ICD-10-CM | POA: Diagnosis not present

## 2018-05-22 LAB — CBC WITH DIFFERENTIAL/PLATELET
Basophils Absolute: 0.1 10*3/uL (ref 0.0–0.1)
Basophils Relative: 1.4 % (ref 0.0–3.0)
Eosinophils Absolute: 0.1 10*3/uL (ref 0.0–0.7)
Eosinophils Relative: 2.7 % (ref 0.0–5.0)
HCT: 29.1 % — ABNORMAL LOW (ref 39.0–52.0)
Hemoglobin: 9 g/dL — ABNORMAL LOW (ref 13.0–17.0)
Lymphocytes Relative: 34.2 % (ref 12.0–46.0)
Lymphs Abs: 1.6 10*3/uL (ref 0.7–4.0)
MCHC: 30.7 g/dL (ref 30.0–36.0)
MCV: 69.4 fl — ABNORMAL LOW (ref 78.0–100.0)
Monocytes Absolute: 0.5 10*3/uL (ref 0.1–1.0)
Monocytes Relative: 10.8 % (ref 3.0–12.0)
Neutro Abs: 2.3 10*3/uL (ref 1.4–7.7)
Neutrophils Relative %: 50.9 % (ref 43.0–77.0)
Platelets: 143 10*3/uL — ABNORMAL LOW (ref 150.0–400.0)
RBC: 4.2 Mil/uL — ABNORMAL LOW (ref 4.22–5.81)
RDW: 22.2 % — ABNORMAL HIGH (ref 11.5–15.5)
WBC: 4.6 10*3/uL (ref 4.0–10.5)

## 2018-05-22 LAB — BASIC METABOLIC PANEL
BUN: 13 mg/dL (ref 6–23)
CO2: 26 mEq/L (ref 19–32)
Calcium: 9.2 mg/dL (ref 8.4–10.5)
Chloride: 102 mEq/L (ref 96–112)
Creatinine, Ser: 0.88 mg/dL (ref 0.40–1.50)
GFR: 87.45 mL/min (ref >60.00)
Glucose, Bld: 213 mg/dL — ABNORMAL HIGH (ref 70–99)
Potassium: 3.9 mEq/L (ref 3.5–5.1)
Sodium: 137 mEq/L (ref 135–145)

## 2018-05-22 MED ORDER — PRAVASTATIN SODIUM 20 MG PO TABS: 20.0000 mg | ORAL_TABLET | Freq: Every day | ORAL | Status: DC

## 2018-05-22 MED ORDER — TAMSULOSIN HCL 0.4 MG PO CAPS: 0.4000 mg | ORAL_CAPSULE | Freq: Every day | ORAL | Status: DC

## 2018-05-22 MED ORDER — GLUCOSE BLOOD VI STRP: ORAL_STRIP | Status: DC

## 2018-05-22 MED ORDER — METOPROLOL SUCCINATE ER 50 MG PO TB24: 50.0000 mg | ORAL_TABLET | Freq: Every day | ORAL | Status: DC

## 2018-05-22 MED ORDER — FINASTERIDE 5 MG PO TABS: 5.0000 mg | ORAL_TABLET | Freq: Every day | ORAL | Status: DC

## 2018-05-22 NOTE — Progress Notes (Signed)
Admit Date: 05/14/2018 Discharge date: 05/17/2018  Recommendations for Outpatient Follow-up:  1. Follow up with PCP in 1-2 weeks 2. Please obtain BMP/CBC in one week 3. Please follow up on the following pending results:Colonoscopy bx results  Admitted From:  Home  Disposition: [place]: No  Equipment/Devices: None  Discharge Condition: Stable  CODE STATUS: FULL COD  Diet recommendation:  Heart Healthy / Carb Modified   Brief Summary: See H&P, Labs, Consult and Test reports for all details in brief,Patient is a87 y.o.male with history of hypertension, dyslipidemia, history of CAD status post PCI, very hard of hearing sent from PCPs office for evaluation of symptomatic anemia (describes a few weeks history of exertional dyspnea). Patient does acknowledge a few few intermittent episodes of dark stools a few weeks back. Patient was subsequently admitted to the hospital service for further evaluation and treatment  Brief Hospital Course: Microcytic anemia:Patient was symptomatic, hence was transfused 1 unit of PRBC on admission. Hemoglobin stable at 8.9. On 8/24, showed 2 AVMs that were ablated-GI not very convinced that this was the source of recent bleeding. Colonoscopy 8/25 showed 2 polyps, diverticulosis, and external hemorrhoids but without any evidence of active bleeding.  No further recommendations from gastroenterology-begin iron supplementation-if still anemic in the next few weeks, may need outpatient capsule endoscopy.  Follow CBC closely in the outpatient setting.  Recent GI bleeding:Does acknowledge history of intermittent dark stools a few weeks back, has new onset microcytic anemia-but no overt bleeding in the past few days. FOBT negative as well. EGD on 8/24 showed 2 AVMs that were ablated, GI not convinced that this was a source of recent bleeding, colonoscopy subsequently completed on 8/25-see above.  Being discharged on iron  supplementation.  Candida esophagitis: Seen incidentally on EGD, continue fluconazole.  DM-2:CBGs stable with SSI, resume oral hypoglycemic agents on discharge.  ZSW:FUXNAT chest pain, aspirin old due to suspicion for recent GI bleeding. Apparently has a history of remote PCI in the past.  Spoke with GI-okay to resume aspirin.  Hypertension:Better controlled this morning-continue metoprolol and lisinopril (dose increased to 10 mg on 8/23) optimize accordingly in the outpatient setting.  Dyslipidemia:Continue with statin.  BPH: Continue Flomax  Procedures/Studies: 8/24>> EGD 8/25>> colonoscopy  Discharge Diagnoses:  Principal Problem:   Symptomatic anemia Active Problems:   Type 2 diabetes mellitus with vascular disease (HCC)   CAD (coronary artery disease)   BPH (benign prostatic hyperplasia)   Essential hypertension  ===================================================== Hospital follow-up.  Inpatient course discussed with patient.  I did not initially know that he was having ongoing GI blood loss.  His A1c did not match up with his fasting home sugars so that led to me checking a hemoglobin.  It was much lower than I expected we contacted him to go to the hospital.  He was admitted and transfused.  He had EGD and colonoscopy done.  He has been started on iron in the meantime.  He is here for follow-up today.  He feels better than he did prior to admission.  No acute changes in the meantime.  He is being treated for Candida found on EGD.  No fevers.  No vomiting.  He does have dark stools on iron.  No other gross blood loss in the meantime.  He does have a sore throat since the hospital stay but this was likely related to EGD.  His voice is better today.  No cough.  Sugars had been 200 or lower, none below 150.  Agreed not to change meds at this point.  No lows.  Our goal regarding his diabetes was to avoid hypoglycemia.  Path reports d/w pt.   Colon, polyp(s),  Ascending x2 - TUBULAR ADENOMA WITHOUT HIGH GRADE DYSPLASIA OR MALIGNANCY. - INFLAMMATORY POLYP.  EGD- NO MALIGNANT CELLS IDENTIFIED. FUNGAL ORGANISMS PRESENT CONSISTENT WITH CANDIDA SP.  We talked about his overall level of conditioning versus deconditioning at this point.  He agreed to defer driving for now.  He has others that can drive for him in the meantime.  PMH and SH reviewed  ROS: Per HPI unless specifically indicated in ROS section   Meds, vitals, and allergies reviewed.   GEN: nad, alert and oriented HEENT: mucous membranes moist NECK: supple w/o LA CV: rrr PULM: ctab, no inc wob ABD: soft, +bs EXT: no edema SKIN: no acute rash

## 2018-05-22 NOTE — Telephone Encounter (Signed)
Left message on machine to call back  

## 2018-05-22 NOTE — Patient Instructions (Addendum)
Keep taking iron.  Go to the lab on the way out.  We'll contact you with your lab report. Keep the appointment with GI.   Take care.  Glad to see you.  Plan on recheck here in 3 months about diabetes but we'll make plans in the meantime when I see your labs and also after you see GI.  Update me about your sugars in about 10 days, sooner if needed.

## 2018-05-25 NOTE — Assessment & Plan Note (Addendum)
I was not expecting him to have such a dramatic drop in his hemoglobin as found on the initial CBC and I told him that.  I appreciate the help of all involved.    Path report discussed with patient.  No malignant findings.  Reasonable to continue iron for now.  He is already being treated for candidal esophagitis.  He looks improved compared to previous.  He feels better.  He is not having chest pain or shortness of breath.  We talked about his options at this point.  Reasonable to continue current medications and recheck labs.  See notes on labs.  Regarding his diabetes, the goal is to avoid hypoglycemia.  I would continue as is for now.  He agrees.  He is okay for outpatient follow-up.  I would expect his sore throat to gradually improve and resolve.  We discussed avoiding NSAIDs but it would be okay to continue 81 mg of aspirin now.  We did not change his blood pressure medications at the visit today.  Blood pressure was reasonable.  See avs.

## 2018-05-26 NOTE — Telephone Encounter (Signed)
Unable to reach pt letter mailed 

## 2018-05-28 ENCOUNTER — Ambulatory Visit (INDEPENDENT_AMBULATORY_CARE_PROVIDER_SITE_OTHER): Payer: Medicare Other | Admitting: *Deleted

## 2018-05-28 ENCOUNTER — Other Ambulatory Visit (INDEPENDENT_AMBULATORY_CARE_PROVIDER_SITE_OTHER): Payer: Medicare Other

## 2018-05-28 ENCOUNTER — Inpatient Hospital Stay: Payer: Medicare Other | Admitting: Family Medicine

## 2018-05-28 DIAGNOSIS — D649 Anemia, unspecified: Secondary | ICD-10-CM

## 2018-05-28 DIAGNOSIS — Z23 Encounter for immunization: Secondary | ICD-10-CM | POA: Diagnosis not present

## 2018-05-28 LAB — CBC WITH DIFFERENTIAL/PLATELET
Basophils Absolute: 0.1 10*3/uL (ref 0.0–0.1)
Basophils Relative: 1 % (ref 0.0–3.0)
Eosinophils Absolute: 0.2 10*3/uL (ref 0.0–0.7)
Eosinophils Relative: 2.9 % (ref 0.0–5.0)
HCT: 29.9 % — ABNORMAL LOW (ref 39.0–52.0)
Hemoglobin: 9.3 g/dL — ABNORMAL LOW (ref 13.0–17.0)
Lymphocytes Relative: 22.8 % (ref 12.0–46.0)
Lymphs Abs: 1.7 10*3/uL (ref 0.7–4.0)
MCHC: 31.1 g/dL (ref 30.0–36.0)
MCV: 69.5 fl — ABNORMAL LOW (ref 78.0–100.0)
Monocytes Absolute: 0.7 10*3/uL (ref 0.1–1.0)
Monocytes Relative: 10.1 % (ref 3.0–12.0)
Neutro Abs: 4.6 10*3/uL (ref 1.4–7.7)
Neutrophils Relative %: 63.2 % (ref 43.0–77.0)
Platelets: 215 10*3/uL (ref 150.0–400.0)
RBC: 4.31 Mil/uL (ref 4.22–5.81)
RDW: 23.4 % — ABNORMAL HIGH (ref 11.5–15.5)
WBC: 7.3 10*3/uL (ref 4.0–10.5)

## 2018-06-01 NOTE — Progress Notes (Signed)
Wife advised of lab results.  Mike Craze, CMA  06/01/2018

## 2018-06-03 ENCOUNTER — Encounter: Payer: Self-pay | Admitting: Nurse Practitioner

## 2018-06-03 ENCOUNTER — Other Ambulatory Visit (INDEPENDENT_AMBULATORY_CARE_PROVIDER_SITE_OTHER): Payer: Medicare Other

## 2018-06-03 ENCOUNTER — Ambulatory Visit (INDEPENDENT_AMBULATORY_CARE_PROVIDER_SITE_OTHER): Payer: Medicare Other | Admitting: Nurse Practitioner

## 2018-06-03 VITALS — BP 130/60 | HR 72 | Ht 64.0 in | Wt 169.0 lb

## 2018-06-03 DIAGNOSIS — D509 Iron deficiency anemia, unspecified: Secondary | ICD-10-CM | POA: Diagnosis not present

## 2018-06-03 DIAGNOSIS — K922 Gastrointestinal hemorrhage, unspecified: Secondary | ICD-10-CM | POA: Diagnosis not present

## 2018-06-03 LAB — CBC
HCT: 30 % — ABNORMAL LOW (ref 39.0–52.0)
Hemoglobin: 9.5 g/dL — ABNORMAL LOW (ref 13.0–17.0)
MCHC: 31.6 g/dL (ref 30.0–36.0)
MCV: 69.5 fl — ABNORMAL LOW (ref 78.0–100.0)
Platelets: 206 10*3/uL (ref 150.0–400.0)
RBC: 4.31 Mil/uL (ref 4.22–5.81)
RDW: 23.9 % — ABNORMAL HIGH (ref 11.5–15.5)
WBC: 6.2 10*3/uL (ref 4.0–10.5)

## 2018-06-03 NOTE — Progress Notes (Signed)
Primary GI:  Oretha Caprice, MD   Chief Complaint: hospital follow up    [place];   87. 84 year old male recently hospitalized with microcytic anemia, dark but heme negative stool.  Etiology unclear, possibly from gastric vascular anomalies. Inpatient EGD >>Two non-bleeding angioectasia in stomach, s/p APC. Colonoscopy unrevealing for source but remarkable for diverticulosis / hemorrhoids and small polyp (adenoma without HGD).  - Received blood transfusion in the hospital with rise in hemoglobin from 7.7-10.3 at discharge.  Repeat CBC on 05/28/2018 shows hemoglobin at 9.3. Stools are dark but I suspect this is secondary to iron.   -recheck CBC today.  -Hopefully we can get patient off iron over the next several weeks.  With dark stool from the iron it would be difficult to know if has any recurrent / ongoing GI bleeding  2. ? Candida esophagiits on EGD. Completed antifungal treatment.   3. Hx of adenomatous colon polyps. No recall colonoscopy given age.    HPI:     Patient is an 84 year old male with DM 2, CAD, nephrolithiasis.  He is hard of hearing, hear with his daughter. He was hospitalized the third week in August for microcytic anemia found on labs at [facility] office.  He had been having some intermittent vague abdominal discomfort but otherwise no GI complaints. He says stool was dark but thought it was from chewing tobacco. He had been taking Aleve every day and was on a daily aspirin but no PPI or H2 blocker. Last labs in EMR were years ago at which time hemoglobin was around 14.  When he presented to the hospital his hemoglobin was around 7.  He was transfused with a rise in hemoglobin to 10.3.  He was discharged home on oral iron  Patient is here for hospital follow-up. He feels okay, no GI complaints. Roney Jaffe he hasn't had any abdominal pain and bowels moviing well. No longer taking NSAIDS.  Daughter says patient appetite is good  ROS: no chest pain, SOB, or fevers.    EGD: 05/16/18 White nummular lesions in esophageal mucosa. Cells for cytology obtained. - Two non-bleeding angioectasias in the stomach. Treated with argon plasma coagulation (APC). - No gross lesions in the stomach. - No gross lesions in the duodenal bulb, in the first portion of the duodenum, in the second  Colonoscopy 05/16/18:  Preparation of the colon was fair. - Non-thrombosed external hemorrhoids found on digital rectal exam. - The examined portion of the ileum was normal. - Stool in the recto-sigmoid colon, in the sigmoid colon, in the descending colon, at the splenic flexure and in the transverse colon. - Two 2 to 5 mm polyps in the ascending colon, removed with a cold snare. Resected and retrieved. - Diverticulosis in the recto-sigmoid colon, in the sigmoid colon and in the descending colon. - Non-bleeding non-thrombosed external and internal hemorrhoids. - If the patient has recurrent, documented, IDA while on Iron supplementation, then consideration of the role of VCE to evaluate if the patient has any further Angioectasias in the small bowel may not be unreasonable, though with his age and medical comorbidities, it would have to beTA without HGD   Past Medical History:  Diagnosis Date  . Arthritis   . Basal cell carcinoma of scalp 12/16/07   Reexcision (Dr. Merril Abbe. Teressa Senter)  . BPH (benign prostatic hyperplasia)   . CAD (coronary artery disease) 11/01   stent placed  . Cataract   . Diabetes mellitus type II 11/01  . GERD (  gastroesophageal reflux disease)    occasionally  . Hearing aid worn   . Hyperlipemia 11/01  . Hypertension 11/01  . Ruptured disc, cervical    Neck C7 (Dr. Pearlie Oyster)     Past Surgical History:  Procedure Laterality Date  . BASAL CELL CARCINOMA EXCISION    . BRONCHIAL BRUSHINGS  05/16/2018   Procedure: ESOPHAGEAL BRUSHINGS;  Surgeon: Irving Copas., MD;  Location: [place];  Service: Gastroenterology;;  . Lillard Anes   02/2000   Dr. Janus Molder - Right  . CERVICAL FUSION    . COLONOSCOPY     2012  . COLONOSCOPY N/A 05/17/2018   Procedure: COLONOSCOPY;  Surgeon: Mansouraty, Telford Nab., MD;  Location: [place];  Service: Gastroenterology;  Laterality: N/A;  . ESOPHAGOGASTRODUODENOSCOPY (EGD) WITH PROPOFOL N/A 05/16/2018   Procedure: ESOPHAGOGASTRODUODENOSCOPY (EGD) WITH PROPOFOL ;  Surgeon: Rush Landmark Telford Nab., MD;  Location: [place];  Service: Gastroenterology;  Laterality: N/A;  . HOT HEMOSTASIS N/A 05/16/2018   Procedure: HOT HEMOSTASIS (ARGON PLASMA COAGULATION/BICAP);  Surgeon: Irving Copas., MD;  Location: [place];  Service: Gastroenterology;  Laterality: N/A;  . KNEE ARTHROSCOPY  09/1999   Right  . LACERATION REPAIR  09/1999   Right Hand (Dr. Fredna Dow)  . LITHOTRIPSY  1991   Dr. Elyse Jarvis  . POLYPECTOMY  05/17/2018   Procedure: POLYPECTOMY;  Surgeon: Mansouraty, Telford Nab., MD;  Location: [place];  Service: Gastroenterology;;  . Cherre Robins CUFF REPAIR  02/05   Left   Family History  Problem Relation Age of Onset  . Stroke Mother        Lived 2 years  . Heart disease Mother        CAD, Angioplasty X 2  . Hypertension Mother   . Heart disease Father        MI  . Hypertension Father   . Hypertension Sister   . Hypertension Brother   . Heart disease Brother        MI  . Hyperlipidemia Brother   . Heart disease Brother        MI   Social History   Tobacco Use  . Smoking status: Former Smoker    Packs/day: 1.00    Years: 25.00    Pack years: 25.00    Types: Cigarettes    Last attempt to quit: 01/09/1976    Years since quitting: 42.4  . Smokeless tobacco: Current User    Types: Chew  . Tobacco comment: quit smoking about 25 years ago  Substance Use Topics  . Alcohol use: No  . Drug use: No   Current Outpatient Medications  Medication Sig Dispense Refill  . acetaminophen (TYLENOL) 500 MG tablet Take 500 mg by mouth every 6 (six) hours as needed (for pain  or headaches).    Marland Kitchen aspirin 81 MG tablet Take 81 mg by mouth at bedtime.     . Cholecalciferol (VITAMIN D-3) 5000 UNITS TABS Take 5,000 Units by mouth daily.     . ferrous sulfate 325 (65 FE) MG tablet Take 1 tablet (325 mg total) by mouth 2 (two) times daily with a meal. 60 tablet 0  . finasteride (PROSCAR) 5 MG tablet Take 1 tablet (5 mg total) by mouth at bedtime.    Marland Kitchen glucose blood (ONE TOUCH ULTRA TEST) test strip TEST BLOOD SUGAR ONCE DAILY. DX: 250.00    . glyBURIDE (DIABETA) 5 MG tablet TAKE 1 TABLET EVERY MORNING.    . Lancets (ONETOUCH ULTRASOFT) lancets Test once daily Dx 250.00     .  lisinopril (PRINIVIL,ZESTRIL) 10 MG tablet Take 1 tablet (10 mg total) by mouth daily. 30 tablet 0  . metFORMIN (GLUCOPHAGE) 1000 MG tablet TAKE 1 TABLET TWICE DAILY 180 tablet 1  . metoprolol succinate (TOPROL-XL) 50 MG 24 hr tablet Take 1 tablet (50 mg total) by mouth daily.    . Multiple Vitamin (MULTIVITAMIN) tablet Take 1 tablet by mouth daily.      . pantoprazole (PROTONIX) 40 MG tablet Take 1 tablet (40 mg total) by mouth daily. 30 tablet 0  . pravastatin (PRAVACHOL) 20 MG tablet Take 1 tablet (20 mg total) by mouth at bedtime.    . tamsulosin (FLOMAX) 0.4 MG CAPS capsule Take 1 capsule (0.4 mg total) by mouth at bedtime.    . vitamin B-12 (CYANOCOBALAMIN) 250 MCG tablet Take 250 mcg by mouth 2 (two) times daily.      No current facility-administered medications for this visit.    Allergies  Allergen Reactions  . Nsaids Other (See Comments)    H/o anemia  . Citrus Hives  . Zocor [Simvastatin] Other (See Comments)    Myalgia    Serum creatinine: 0.88 mg/dL 05/22/18 1322 Estimated creatinine clearance: 57.5 mL/min   Physical Exam:    Wt Readings from Last 3 Encounters:  06/03/18 169 lb (76.7 kg)  05/22/18 158 lb 8 oz (71.9 kg)  05/15/18 156 lb 12 oz (71.1 kg)    BP 130/60 (BP Location: Left Arm, Patient Position: Sitting, Cuff Size: Normal)   Pulse 72   Ht 5\' 4"  (1.626 m)  Comment: height measured without shoes  Wt 169 lb (76.7 kg)   BMI 29.01 kg/m  Constitutional:  Pleasant male in no acute distress. Psychiatric: Normal mood and affect. Behavior is normal. EENT: Pupils normal.  Conjunctivae are normal. No scleral icterus. Neck supple.  Cardiovascular: Normal rate, regular rhythm. No edema Pulmonary/chest: Effort normal and breath sounds normal. No wheezing, rales or rhonchi. Abdominal: Soft, nondistended, nontender. Bowel sounds active throughout. There are no masses palpable. No hepatomegaly. Neurological: Alert and oriented to person place and time. Skin: Skin is warm and dry. No rashes noted.  Tye Savoy, NP  06/03/2018, 1:44 PM  Cc: Tonia Ghent, MD

## 2018-06-03 NOTE — Patient Instructions (Signed)
If you are age 82 or older, your body mass index should be between 23-30. Your Body mass index is 29.01 kg/m. If this is out of the aforementioned range listed, please consider follow up with your Primary Care Provider.  If you are age 42 or younger, your body mass index should be between 19-25. Your Body mass index is 29.01 kg/m. If this is out of the aformentioned range listed, please consider follow up with your Primary Care Provider.   Your provider has requested that you go to the basement level for lab work before leaving today. Press "B" on the elevator. The lab is located at the first door on the left as you exit the elevator. CBC   We will call you with results.  Thank you for choosing me and [facility].   Tye Savoy, NP

## 2018-06-04 NOTE — Progress Notes (Signed)
I agree with the above note, plan 

## 2018-06-14 ENCOUNTER — Telehealth: Payer: Self-pay | Admitting: Family Medicine

## 2018-06-14 NOTE — Telephone Encounter (Signed)
Please call patient.  I would like to check him in the next week or 2.  We can check his labs at the office visit.  I saw his most recent labs from September 11th at GI.  If he is having changes for the worse in the meantime then please let me know and get checked sooner.  Thanks.

## 2018-06-15 NOTE — Telephone Encounter (Signed)
Patient notified as instructed by telephone and verbalized understanding. Follow-up appointment scheduled for 06/25/18.

## 2018-06-23 ENCOUNTER — Other Ambulatory Visit: Payer: Self-pay

## 2018-06-23 DIAGNOSIS — D509 Iron deficiency anemia, unspecified: Secondary | ICD-10-CM

## 2018-06-23 DIAGNOSIS — K922 Gastrointestinal hemorrhage, unspecified: Secondary | ICD-10-CM

## 2018-06-25 ENCOUNTER — Ambulatory Visit (INDEPENDENT_AMBULATORY_CARE_PROVIDER_SITE_OTHER): Payer: Medicare Other | Admitting: Family Medicine

## 2018-06-25 ENCOUNTER — Encounter: Payer: Self-pay | Admitting: Family Medicine

## 2018-06-25 VITALS — BP 158/52 | HR 77 | Temp 97.6°F | Ht 64.0 in | Wt 158.5 lb

## 2018-06-25 DIAGNOSIS — R809 Proteinuria, unspecified: Secondary | ICD-10-CM

## 2018-06-25 DIAGNOSIS — E1129 Type 2 diabetes mellitus with other diabetic kidney complication: Secondary | ICD-10-CM

## 2018-06-25 DIAGNOSIS — L989 Disorder of the skin and subcutaneous tissue, unspecified: Secondary | ICD-10-CM

## 2018-06-25 DIAGNOSIS — D649 Anemia, unspecified: Secondary | ICD-10-CM

## 2018-06-25 LAB — CBC WITH DIFFERENTIAL/PLATELET
Basophils Absolute: 0 10*3/uL (ref 0.0–0.1)
Basophils Relative: 0.8 % (ref 0.0–3.0)
Eosinophils Absolute: 0.1 10*3/uL (ref 0.0–0.7)
Eosinophils Relative: 1.6 % (ref 0.0–5.0)
HCT: 31.5 % — ABNORMAL LOW (ref 39.0–52.0)
Hemoglobin: 9.9 g/dL — ABNORMAL LOW (ref 13.0–17.0)
Lymphocytes Relative: 29 % (ref 12.0–46.0)
Lymphs Abs: 1.7 10*3/uL (ref 0.7–4.0)
MCHC: 31.4 g/dL (ref 30.0–36.0)
MCV: 72.3 fl — ABNORMAL LOW (ref 78.0–100.0)
Monocytes Absolute: 0.7 10*3/uL (ref 0.1–1.0)
Monocytes Relative: 12.2 % — ABNORMAL HIGH (ref 3.0–12.0)
Neutro Abs: 3.3 10*3/uL (ref 1.4–7.7)
Neutrophils Relative %: 56.4 % (ref 43.0–77.0)
Platelets: 188 10*3/uL (ref 150.0–400.0)
RBC: 4.36 Mil/uL (ref 4.22–5.81)
RDW: 22.7 % — ABNORMAL HIGH (ref 11.5–15.5)
WBC: 5.9 10*3/uL (ref 4.0–10.5)

## 2018-06-25 LAB — IBC PANEL
Iron: 30 ug/dL — ABNORMAL LOW (ref 42–165)
Saturation Ratios: 7.3 % — ABNORMAL LOW (ref 20.0–50.0)
Transferrin: 293 mg/dL (ref 212.0–360.0)

## 2018-06-25 LAB — FERRITIN: Ferritin: 6 ng/mL — ABNORMAL LOW (ref 22.0–322.0)

## 2018-06-25 MED ORDER — PANTOPRAZOLE SODIUM 40 MG PO TBEC: 40.0000 mg | DELAYED_RELEASE_TABLET | Freq: Every day | ORAL | 3 refills | Status: DC

## 2018-06-25 NOTE — Progress Notes (Signed)
No dysphagia- that is clearly improved.   Discussed with patient.  Still on iron BID, having black stools likely from that.    Not lightheaded if standing too quickly.  He has balance trouble with gaze elevation at baseline, if trying to pick something off the floor, going on for months.  Using cane at baseline.  He fell a few weeks ago.  Fall cautions discussed with patient.  He went to PT earlier this year.  We talked about restarting his HEP re: PT exercises.  He wanted to restart his home exercise program.  I asked him not to drive or travel alone until his fall situation was better.    Patient's blood sugars have been averaging from 150 to around 240.  No lows.  Compliant with medication.  L side of nose with possible BCC, noted in the last month.  D/w pt.   Meds, vitals, and allergies reviewed.   ROS: Per HPI unless specifically indicated in ROS section   GEN: nad, alert and oriented HEENT: mucous membranes moist NECK: supple w/o LA CV: rrr.  no murmur PULM: ctab, no inc wob ABD: soft, +bs EXT: no edema SKIN: no acute rash, but small papular lesion noted on the left side of the nose that could be a possible BCC.

## 2018-06-25 NOTE — Patient Instructions (Addendum)
Go to the lab on the way out.  We'll contact you with your lab report. Call the skin clinic about a follow up appointment.  Take care.  Glad to see you.   We'll make plans when I see your labs .

## 2018-06-28 ENCOUNTER — Other Ambulatory Visit: Payer: Self-pay | Admitting: Family Medicine

## 2018-06-28 DIAGNOSIS — S51019A Laceration without foreign body of unspecified elbow, initial encounter: Secondary | ICD-10-CM | POA: Insufficient documentation

## 2018-06-28 DIAGNOSIS — L989 Disorder of the skin and subcutaneous tissue, unspecified: Secondary | ICD-10-CM | POA: Insufficient documentation

## 2018-06-28 MED ORDER — GLYBURIDE 5 MG PO TABS: 10.0000 mg | ORAL_TABLET | Freq: Every day | ORAL | Status: DC

## 2018-06-28 NOTE — Assessment & Plan Note (Signed)
Still on iron twice a day and having black stools as expected.  He is not lightheaded.  We did talk about fall cautions.  He will restart his home exercise program.  See above.  Routine cautions given.  See notes on labs.  >25 minutes spent in face to face time with patient, >50% spent in counselling or coordination of care.

## 2018-06-28 NOTE — Assessment & Plan Note (Signed)
He has had persistent sugar elevations as described above.  I want to see his labs first.  See notes on labs prior to changing any medications.

## 2018-06-28 NOTE — Assessment & Plan Note (Signed)
Concern for possible BCC, discussed with patient.  He will call the dermatology clinic about follow-up.  I will defer to the patient.  He agrees with this plan.

## 2018-06-29 ENCOUNTER — Telehealth: Payer: Self-pay | Admitting: Family Medicine

## 2018-06-29 NOTE — Telephone Encounter (Signed)
See below.  App GI help.  I'll defer.

## 2018-06-29 NOTE — Progress Notes (Signed)
Dan, patient's hemoglobin is holding but iron stores still depleted. What do you think about IV iron for him Thanks PG

## 2018-06-29 NOTE — Telephone Encounter (Signed)
-----   Message from Willia Craze, NP sent at 06/29/2018 12:14 PM EDT ----- Will take care of and probably try IV iron first. Thanks ----- Message ----- From: Tonia Ghent, MD Sent: 06/28/2018  11:52 PM EDT To: Willia Craze, NP  His iron level is still low in spite of taking iron twice a day.  Do you want to see him back, do think he would be a good candidate for IV iron, do you think we should refer him over to hematology?  Please let me know.  Thanks.  Brigitte Pulse

## 2018-07-01 ENCOUNTER — Other Ambulatory Visit: Payer: Self-pay

## 2018-07-01 DIAGNOSIS — C44321 Squamous cell carcinoma of skin of nose: Secondary | ICD-10-CM | POA: Diagnosis not present

## 2018-07-01 DIAGNOSIS — D509 Iron deficiency anemia, unspecified: Secondary | ICD-10-CM

## 2018-07-01 DIAGNOSIS — D485 Neoplasm of uncertain behavior of skin: Secondary | ICD-10-CM | POA: Diagnosis not present

## 2018-07-01 DIAGNOSIS — L309 Dermatitis, unspecified: Secondary | ICD-10-CM | POA: Diagnosis not present

## 2018-07-03 ENCOUNTER — Telehealth: Payer: Self-pay | Admitting: Family Medicine

## 2018-07-03 NOTE — Telephone Encounter (Signed)
No answer, left message on VM to continue taking the dosage that he is currently taking and we will be in touch on Monday.

## 2018-07-03 NOTE — Telephone Encounter (Signed)
Copied from [place] [id number]. Topic: Quick Communication - Rx Refill/Question >> Jul 03, 2018  7:56 AM Scherrie Gerlach wrote: Medication: lisinopril (PRINIVIL,ZESTRIL) 2.5 mg or 10 mg Pt was prescribed the 10 mg while in the hospital, and wife wants to know if Dr [name] wants this increased to the 10 mg or stay on 2.5 mg? Please advise and call wife at work (pt does not hear well on the phone) Pt has a 2.5 mg refill at [facility] if he needs to get that refilled.

## 2018-07-03 NOTE — Telephone Encounter (Signed)
I had him listed at 10mg  a day at the last OV and his BP wasn't low.  I don't want his BP to go too low.   Verify what dose he has been taking currently.  I would continue as is for now.   Let me know about this so I can address over the weekend/Monday if not today (if he needs a refill, etc). Thanks.

## 2018-07-05 NOTE — Telephone Encounter (Signed)
Thanks.  Let me know what you hear.

## 2018-07-06 NOTE — Telephone Encounter (Signed)
Patient is taking Lisinopril 2.5 mg daily.  No refills needed at this time.  Wife said he was on 10 mg in the hospital and had a few remaining when he came home from the hospital but then went back on the 2.5 mg.

## 2018-07-06 NOTE — Telephone Encounter (Signed)
I finally was able to get Mr. Krenz to answer the phone but he could not hear me well enough to answer my questions.  He says that "she" will be home around 4:45 pm and he will ask her to call.  I was never able to find out who "she" was.

## 2018-07-07 MED ORDER — LISINOPRIL 2.5 MG PO TABS: 2.5000 mg | ORAL_TABLET | Freq: Every day | ORAL | Status: DC

## 2018-07-07 NOTE — Telephone Encounter (Signed)
Noted. Thanks.

## 2018-07-13 DIAGNOSIS — C44321 Squamous cell carcinoma of skin of nose: Secondary | ICD-10-CM | POA: Diagnosis not present

## 2018-07-27 ENCOUNTER — Other Ambulatory Visit (INDEPENDENT_AMBULATORY_CARE_PROVIDER_SITE_OTHER): Payer: Medicare Other

## 2018-07-27 DIAGNOSIS — D509 Iron deficiency anemia, unspecified: Secondary | ICD-10-CM | POA: Diagnosis not present

## 2018-07-27 LAB — CBC WITH DIFFERENTIAL/PLATELET
Basophils Absolute: 0.1 10*3/uL (ref 0.0–0.1)
Basophils Relative: 0.7 % (ref 0.0–3.0)
Eosinophils Absolute: 0.2 10*3/uL (ref 0.0–0.7)
Eosinophils Relative: 2.9 % (ref 0.0–5.0)
HCT: 33.9 % — ABNORMAL LOW (ref 39.0–52.0)
Hemoglobin: 10.8 g/dL — ABNORMAL LOW (ref 13.0–17.0)
Lymphocytes Relative: 33.9 % (ref 12.0–46.0)
Lymphs Abs: 2.6 10*3/uL (ref 0.7–4.0)
MCHC: 32 g/dL (ref 30.0–36.0)
MCV: 73.4 fl — ABNORMAL LOW (ref 78.0–100.0)
Monocytes Absolute: 0.8 10*3/uL (ref 0.1–1.0)
Monocytes Relative: 10.5 % (ref 3.0–12.0)
Neutro Abs: 4 10*3/uL (ref 1.4–7.7)
Neutrophils Relative %: 52 % (ref 43.0–77.0)
Platelets: 213 10*3/uL (ref 150.0–400.0)
RBC: 4.62 Mil/uL (ref 4.22–5.81)
RDW: 18.8 % — ABNORMAL HIGH (ref 11.5–15.5)
WBC: 7.6 10*3/uL (ref 4.0–10.5)

## 2018-07-27 LAB — IBC PANEL
Iron: 25 ug/dL — ABNORMAL LOW (ref 42–165)
Saturation Ratios: 6 % — ABNORMAL LOW (ref 20.0–50.0)
Transferrin: 296 mg/dL (ref 212.0–360.0)

## 2018-07-27 LAB — FERRITIN: Ferritin: 6.5 ng/mL — ABNORMAL LOW (ref 22.0–322.0)

## 2018-08-13 ENCOUNTER — Ambulatory Visit: Payer: Medicare Other

## 2018-08-17 ENCOUNTER — Encounter: Payer: Self-pay | Admitting: Family Medicine

## 2018-08-17 ENCOUNTER — Ambulatory Visit (INDEPENDENT_AMBULATORY_CARE_PROVIDER_SITE_OTHER): Payer: Medicare Other | Admitting: Family Medicine

## 2018-08-17 VITALS — BP 138/60 | HR 65 | Temp 97.5°F | Ht 64.0 in | Wt 157.5 lb

## 2018-08-17 DIAGNOSIS — N400 Enlarged prostate without lower urinary tract symptoms: Secondary | ICD-10-CM | POA: Diagnosis not present

## 2018-08-17 DIAGNOSIS — D509 Iron deficiency anemia, unspecified: Secondary | ICD-10-CM | POA: Diagnosis not present

## 2018-08-17 DIAGNOSIS — Z7189 Other specified counseling: Secondary | ICD-10-CM

## 2018-08-17 DIAGNOSIS — E1129 Type 2 diabetes mellitus with other diabetic kidney complication: Secondary | ICD-10-CM

## 2018-08-17 DIAGNOSIS — R809 Proteinuria, unspecified: Secondary | ICD-10-CM

## 2018-08-17 DIAGNOSIS — I10 Essential (primary) hypertension: Secondary | ICD-10-CM

## 2018-08-17 DIAGNOSIS — K922 Gastrointestinal hemorrhage, unspecified: Secondary | ICD-10-CM | POA: Diagnosis not present

## 2018-08-17 DIAGNOSIS — Z0000 Encounter for general adult medical examination without abnormal findings: Secondary | ICD-10-CM

## 2018-08-17 DIAGNOSIS — D649 Anemia, unspecified: Secondary | ICD-10-CM

## 2018-08-17 DIAGNOSIS — E785 Hyperlipidemia, unspecified: Secondary | ICD-10-CM | POA: Diagnosis not present

## 2018-08-17 LAB — LIPID PANEL
Cholesterol: 128 mg/dL (ref 0–200)
HDL: 41.9 mg/dL (ref >39.00)
LDL Cholesterol: 59 mg/dL (ref 0–99)
NonHDL: 85.78
Total CHOL/HDL Ratio: 3
Triglycerides: 134 mg/dL (ref 0.0–149.0)
VLDL: 26.8 mg/dL (ref 0.0–40.0)

## 2018-08-17 LAB — COMPREHENSIVE METABOLIC PANEL
ALT: 19 U/L (ref 0–53)
AST: 20 U/L (ref 0–37)
Albumin: 4.2 g/dL (ref 3.5–5.2)
Alkaline Phosphatase: 57 U/L (ref 39–117)
BUN: 16 mg/dL (ref 6–23)
CO2: 27 mEq/L (ref 19–32)
Calcium: 9.7 mg/dL (ref 8.4–10.5)
Chloride: 102 mEq/L (ref 96–112)
Creatinine, Ser: 0.9 mg/dL (ref 0.40–1.50)
GFR: 85.16 mL/min (ref >60.00)
Glucose, Bld: 214 mg/dL — ABNORMAL HIGH (ref 70–99)
Potassium: 4.5 mEq/L (ref 3.5–5.1)
Sodium: 137 mEq/L (ref 135–145)
Total Bilirubin: 0.4 mg/dL (ref 0.2–1.2)
Total Protein: 6.9 g/dL (ref 6.0–8.3)

## 2018-08-17 LAB — CBC WITH DIFFERENTIAL/PLATELET
Basophils Absolute: 0.1 10*3/uL (ref 0.0–0.1)
Basophils Relative: 1.3 % (ref 0.0–3.0)
Eosinophils Absolute: 0.1 10*3/uL (ref 0.0–0.7)
Eosinophils Relative: 2.3 % (ref 0.0–5.0)
HCT: 30.7 % — ABNORMAL LOW (ref 39.0–52.0)
Hemoglobin: 9.8 g/dL — ABNORMAL LOW (ref 13.0–17.0)
Lymphocytes Relative: 30.3 % (ref 12.0–46.0)
Lymphs Abs: 1.7 10*3/uL (ref 0.7–4.0)
MCHC: 31.9 g/dL (ref 30.0–36.0)
MCV: 73.3 fl — ABNORMAL LOW (ref 78.0–100.0)
Monocytes Absolute: 0.6 10*3/uL (ref 0.1–1.0)
Monocytes Relative: 11.2 % (ref 3.0–12.0)
Neutro Abs: 3.1 10*3/uL (ref 1.4–7.7)
Neutrophils Relative %: 54.9 % (ref 43.0–77.0)
Platelets: 181 10*3/uL (ref 150.0–400.0)
RBC: 4.19 Mil/uL — ABNORMAL LOW (ref 4.22–5.81)
RDW: 17.2 % — ABNORMAL HIGH (ref 11.5–15.5)
WBC: 5.6 10*3/uL (ref 4.0–10.5)

## 2018-08-17 LAB — FERRITIN: Ferritin: 5.9 ng/mL — ABNORMAL LOW (ref 22.0–322.0)

## 2018-08-17 LAB — HEMOGLOBIN A1C: Hgb A1c MFr Bld: 8.3 % — ABNORMAL HIGH (ref 4.6–6.5)

## 2018-08-17 NOTE — Addendum Note (Signed)
Addended by: Lendon Collar on: 08/17/2018 11:38 AM   Modules accepted: Orders

## 2018-08-17 NOTE — Progress Notes (Signed)
I have personally reviewed the Medicare Annual Wellness questionnaire and have noted 1. The patient's medical and social history 2. Their use of alcohol, tobacco or illicit drugs 3. Their current medications and supplements 4. The patient's functional ability including ADL's, fall risks, home safety risks and hearing or visual             impairment. 5. Diet and physical activities 6. Evidence for depression or mood disorders  The patients weight, height, BMI have been recorded in the chart and visual acuity is per eye clinic.  I have made referrals, counseling and provided education to the patient based review of the above and I have provided the pt with a written personalized care plan for preventive services.  Provider list updated- see scanned forms.  Routine anticipatory guidance given to patient.  See health maintenance. The possibility exists that previously documented standard health maintenance information may have been brought forward from a previous encounter into this note.  If needed, that same information has been updated to reflect the current situation based on today's encounter.    Flu up to date Shingles prev done PNA up to date Tetanus up to date Colonoscopy 2019 Prostate cancer screening NA due to age, patient agrees.  Advance directive- wife son and daughter all equally designated.    Cognitive function addressed- see scanned forms- and if abnormal then additional documentation follows.   Fall cautions d/w pt. Offered PT, he can consider.  He declined PT at this point.  Info re: fall button given to patient.  I advised him not to drive given his fall risk.  IDA.  Still on iron.  Has GI f/u pending.  Energy level is improved.  See notes on labs.    Diabetes:  Using medications without difficulties: no Hypoglycemic episodes: no Hyperglycemic episodes:up to 300, usually closer to 200.   Feet problems:no Blood Sugars averaging: see above.  eye exam within last year:  due, d/w pt.   Labs pending.   Hypertension:    Using medication without problems or lightheadedness: yes Chest pain with exertion:no Edema:no Short of breath:no  Elevated Cholesterol: Using medications without problems: yes Muscle aches: no Diet compliance: encouraged.  Exercise: limited.  BPH.  Defer PSA.  Still on flomax and finasteride.  Able to tolerate both medications.  PMH and SH reviewed  Meds, vitals, and allergies reviewed.   ROS: Per HPI.  Unless specifically indicated otherwise in HPI, the patient denies:  General: fever. Eyes: acute vision changes ENT: sore throat Cardiovascular: chest pain Respiratory: SOB GI: vomiting GU: dysuria Musculoskeletal: acute back pain Derm: acute rash Neuro: acute motor dysfunction Psych: worsening mood Endocrine: polydipsia Heme: bleeding Allergy: hayfever  GEN: nad, alert and oriented HEENT: mucous membranes moist NECK: supple w/o LA CV: rrr. PULM: ctab, no inc wob ABD: soft, +bs EXT: no edema SKIN: no acute rash  Diabetic foot exam: Normal inspection No skin breakdown No calluses  Normal DP pulses Normal sensation to light touch and monofilament Nails thickened with small hangnail on the R lateral 5th nail, covered with bandaid and doesn't appear infected or irritated.

## 2018-08-17 NOTE — Patient Instructions (Addendum)
Don't change your meds yet.  I want to see your labs first.  Go to the lab on the way out.  We'll contact you with your lab report. Plan on a recheck in 3 months with labs at the visit.  If you need help getting an appointment at the podiatry clinic, then let me know.  Take care.  Glad to see you.  Update me as needed.

## 2018-08-18 ENCOUNTER — Encounter: Payer: Self-pay | Admitting: Gastroenterology

## 2018-08-18 ENCOUNTER — Ambulatory Visit (INDEPENDENT_AMBULATORY_CARE_PROVIDER_SITE_OTHER): Payer: Medicare Other | Admitting: Gastroenterology

## 2018-08-18 VITALS — BP 128/60 | HR 68 | Ht 64.0 in | Wt 157.4 lb

## 2018-08-18 DIAGNOSIS — D509 Iron deficiency anemia, unspecified: Secondary | ICD-10-CM | POA: Diagnosis not present

## 2018-08-18 DIAGNOSIS — R634 Abnormal weight loss: Secondary | ICD-10-CM | POA: Diagnosis not present

## 2018-08-18 LAB — IRON AND TIBC
Iron Saturation: 7 % — CL (ref 15–55)
Iron: 23 ug/dL — ABNORMAL LOW (ref 38–169)
Total Iron Binding Capacity: 345 ug/dL (ref 250–450)
UIBC: 322 ug/dL (ref 111–343)

## 2018-08-18 NOTE — Patient Instructions (Addendum)
Please call back to inform Dr. Ardis Hughs office if you are taking iron supplement, ferrous sulfate.  Thank you for entrusting me with your care and choosing [facility].  Dr Ardis Hughs

## 2018-08-18 NOTE — Progress Notes (Signed)
Review of pertinent gastrointestinal problems: 1.  Dark stools, microcytic anemia (Hb nadir 7.6) Hemoccult negative.  Admitted August 2019, 1 unit PRBC while inpatient.  EGD and colonoscopy Dr. Rush Landmark while inpatient showed 2 nonbleeding AVMs in his stomach.  These were cauterized with APC.  Colonoscopy showed diverticulosis hemorrhoids and a small adenoma without high-grade dysplasia.  Hemoglobin followed serially with good but incomplete improvement.  Takes iron daily.   HPI: This is a very pleasant 84 year old man who is here with his wife today.  CBC yesterday showed a hemoglobin of 9.8.  It was 10.8 a couple months ago.  He intermittently has dark stools.  He and his wife are not sure if he has actually been taking the iron that we recommended.  He has lost about 12 pounds since his last office visit here  Chief complaint is anemia, weight loss  ROS: complete GI ROS as described in HPI, all other review negative.  Constitutional:  No unintentional weight loss   Past Medical History:  Diagnosis Date  . Arthritis   . Basal cell carcinoma of scalp 12/16/07   Reexcision (Dr. Merril Abbe. Teressa Senter)  . BPH (benign prostatic hyperplasia)   . CAD (coronary artery disease) 11/01   stent placed  . Cataract   . Diabetes mellitus type II 11/01  . GERD (gastroesophageal reflux disease)    occasionally  . Hearing aid worn   . Hyperlipemia 11/01  . Hypertension 11/01  . Ruptured disc, cervical    Neck C7 (Dr. Pearlie Oyster)    Past Surgical History:  Procedure Laterality Date  . BASAL CELL CARCINOMA EXCISION    . BRONCHIAL BRUSHINGS  05/16/2018   Procedure: ESOPHAGEAL BRUSHINGS;  Surgeon: Irving Copas., MD;  Location: [facility];  Service: Gastroenterology;;  . Lillard Anes  02/2000   Dr. Janus Molder - Right  . CERVICAL FUSION    . COLONOSCOPY     2012  . COLONOSCOPY N/A 05/17/2018   Procedure: COLONOSCOPY;  Surgeon: Mansouraty, Telford Nab., MD;  Location: [facility];   Service: Gastroenterology;  Laterality: N/A;  . ESOPHAGOGASTRODUODENOSCOPY (EGD) WITH PROPOFOL N/A 05/16/2018   Procedure: ESOPHAGOGASTRODUODENOSCOPY (EGD) WITH PROPOFOL ;  Surgeon: Rush Landmark Telford Nab., MD;  Location: [facility];  Service: Gastroenterology;  Laterality: N/A;  . HOT HEMOSTASIS N/A 05/16/2018   Procedure: HOT HEMOSTASIS (ARGON PLASMA COAGULATION/BICAP);  Surgeon: Irving Copas., MD;  Location: [facility];  Service: Gastroenterology;  Laterality: N/A;  . KNEE ARTHROSCOPY  09/1999   Right  . LACERATION REPAIR  09/1999   Right Hand (Dr. Fredna Dow)  . LITHOTRIPSY  1991   Dr. Elyse Jarvis  . POLYPECTOMY  05/17/2018   Procedure: POLYPECTOMY;  Surgeon: Mansouraty, Telford Nab., MD;  Location: [facility];  Service: Gastroenterology;;  . Cherre Robins CUFF REPAIR  02/05   Left    Current Outpatient Medications  Medication Sig Dispense Refill  . acetaminophen (TYLENOL) 500 MG tablet Take 500 mg by mouth every 6 (six) hours as needed (for pain or headaches).    Marland Kitchen aspirin 81 MG tablet Take 81 mg by mouth at bedtime.     . Cholecalciferol (VITAMIN D-3) 5000 UNITS TABS Take 5,000 Units by mouth daily.     . ferrous sulfate 325 (65 FE) MG tablet Take 1 tablet (325 mg total) by mouth 2 (two) times daily with a meal. 60 tablet 0  . finasteride (PROSCAR) 5 MG tablet Take 1 tablet (5 mg total) by mouth at bedtime.    Marland Kitchen glucose blood (ONE TOUCH ULTRA TEST) test  strip TEST BLOOD SUGAR ONCE DAILY. DX: 250.00    . glyBURIDE (DIABETA) 5 MG tablet Take 2 tablets (10 mg total) by mouth daily with breakfast.    . Lancets (ONETOUCH ULTRASOFT) lancets Test once daily Dx 250.00     . lisinopril (PRINIVIL,ZESTRIL) 2.5 MG tablet Take 1 tablet (2.5 mg total) by mouth daily.    . metFORMIN (GLUCOPHAGE) 1000 MG tablet TAKE 1 TABLET TWICE DAILY 180 tablet 1  . metoprolol succinate (TOPROL-XL) 50 MG 24 hr tablet Take 1 tablet (50 mg total) by mouth daily.    . Multiple Vitamin (MULTIVITAMIN) tablet Take 1  tablet by mouth daily.      . pantoprazole (PROTONIX) 40 MG tablet Take 1 tablet (40 mg total) by mouth daily. 90 tablet 3  . pravastatin (PRAVACHOL) 20 MG tablet Take 1 tablet (20 mg total) by mouth at bedtime.    . tamsulosin (FLOMAX) 0.4 MG CAPS capsule Take 1 capsule (0.4 mg total) by mouth at bedtime.    . vitamin B-12 (CYANOCOBALAMIN) 250 MCG tablet Take 250 mcg by mouth 2 (two) times daily.      No current facility-administered medications for this visit.     Allergies as of 08/18/2018 - Review Complete 08/18/2018  Allergen Reaction Noted  . Nsaids Other (See Comments) 05/17/2018  . Citrus Hives 05/14/2018  . Zocor [simvastatin] Other (See Comments) 02/10/2014    Family History  Problem Relation Age of Onset  . Stroke Mother        Lived 2 years  . Heart disease Mother        CAD, Angioplasty X 2  . Hypertension Mother   . Heart disease Father        MI  . Hypertension Father   . Hypertension Sister   . Hypertension Brother   . Heart disease Brother        MI  . Hyperlipidemia Brother   . Heart disease Brother        MI    Social History   Socioeconomic History  . Marital status: Married    Spouse name: Not on file  . Number of children: 2  . Years of education: Not on file  . Highest education level: Not on file  Occupational History  . Occupation: Carpentry  Social Needs  . Financial resource strain: Not on file  . Food insecurity:    Worry: Not on file    Inability: Not on file  . Transportation needs:    Medical: Not on file    Non-medical: Not on file  Tobacco Use  . Smoking status: Former Smoker    Packs/day: 1.00    Years: 25.00    Pack years: 25.00    Types: Cigarettes    Last attempt to quit: 01/09/1976    Years since quitting: 42.6  . Smokeless tobacco: Current User    Types: Chew  . Tobacco comment: quit smoking about 25 years ago  Substance and Sexual Activity  . Alcohol use: No  . Drug use: No  . Sexual activity: Never  Lifestyle   . Physical activity:    Days per week: Not on file    Minutes per session: Not on file  . Stress: Not on file  Relationships  . Social connections:    Talks on phone: Not on file    Gets together: Not on file    Attends religious service: Not on file    Active member of club or organization: Not  on file    Attends meetings of clubs or organizations: Not on file    Relationship status: Not on file  . Intimate partner violence:    Fear of current or ex partner: Not on file    Emotionally abused: Not on file    Physically abused: Not on file    Forced sexual activity: Not on file  Other Topics Concern  . Not on file  Social History Narrative   Married 1955   2 kids   Retired from Therapist, art     Physical Exam: BP 128/60 (BP Location: Left Arm, Patient Position: Sitting, Cuff Size: Normal)   Pulse 68 Comment: irregular  Ht 5\' 4"  (1.626 m)   Wt 157 lb 6 oz (71.4 kg)   BMI 27.01 kg/m  Constitutional: generally well-appearing Psychiatric: alert and oriented x3 Abdomen: soft, nontender, nondistended, no obvious ascites, no peritoneal signs, normal bowel sounds No peripheral edema noted in lower extremities  Assessment and plan: 84 y.o. male with anemia, weight loss  First it is not clear to me if his iron deficiency anemia is from a gastrointestinal source.  Indeed he was found to have 2 small AVMs in his stomach in August when he was hospitalized.  Those were treated with cautery.  I am not sure if those were the source of his anemia however.  Especially since his stools were Hemoccult negative.  He and his wife are not sure if he is actually been taking iron supplements since the discharge.  They think he probably has not been.  His blood counts are coming up normally.  They are better than when he was hospitalized but still low.  His wife is going to call us back later today to let us know if he is actually taking the iron supplement.  If he is not he needs to start  taking ferrous sulfate as prescribed, 1 pill twice daily and I would repeat his blood counts in 2 months to see if they are increasing.  If he is taking iron supplement daily and yet his blood counts are not really increasing then I would likely suggest further testing.  Since he was Hemoccult negative and his EGD and colon were already done I would likely recommend imaging studies starting with a CT scan abdomen pelvis and chest x-ray.  He has lost 12 pounds in 2 months.  Overall I do have some concern for underlying neoplasm.  Please see the "Patient Instructions" section for addition details about the plan.  Owens Loffler, MD [facility] 08/18/2018, 1:15 PM

## 2018-08-19 ENCOUNTER — Telehealth: Payer: Self-pay | Admitting: Family Medicine

## 2018-08-19 ENCOUNTER — Telehealth: Payer: Self-pay

## 2018-08-19 MED ORDER — METOPROLOL SUCCINATE ER 50 MG PO TB24: 50.0000 mg | ORAL_TABLET | Freq: Every day | ORAL | 3 refills | Status: DC

## 2018-08-19 MED ORDER — LISINOPRIL 2.5 MG PO TABS: 2.5000 mg | ORAL_TABLET | Freq: Every day | ORAL | 3 refills | Status: DC

## 2018-08-19 MED ORDER — GLYBURIDE 5 MG PO TABS: 10.0000 mg | ORAL_TABLET | Freq: Every day | ORAL | 3 refills | Status: DC

## 2018-08-19 NOTE — Assessment & Plan Note (Signed)
Flu up to date Shingles prev done PNA up to date Tetanus up to date Colonoscopy 2019 Prostate cancer screening NA due to age, patient agrees.  Advance directive- wife son and daughter all equally designated.    Cognitive function addressed- see scanned forms- and if abnormal then additional documentation follows.

## 2018-08-19 NOTE — Telephone Encounter (Signed)
Patient forgot to ask in his appointment to refill Glyburide 5mg , Lisenopril 2.5 mg, and Metropolol 50 mg. He requests we call [facility] to renew these prescriptions that are now expired.  Thank you

## 2018-08-19 NOTE — Telephone Encounter (Signed)
Lab notes per Dr Duncan:I saw the notes from GI. His iron level is lower than previous. His hemoglobin was improving but it has gotten lower in the meantime. Please verify if he is actually taking iron. If he has not been taking iron then he needs to restart it. If he has already been taking iron then we need to make other plans.  His lipids were okay. His kidney and liver tests were fine. His A1c was up. I want to recheck this in 3 months. In the meantime I want him to see what he can do in terms of cutting out sweets and let me know about his sugar readings in the next 2 weeks. Thanks.

## 2018-08-19 NOTE — Assessment & Plan Note (Signed)
Discussed foot care with patient.  No change in medications at this point.  I want to avoid hypoglycemia.  See notes on labs.

## 2018-08-19 NOTE — Assessment & Plan Note (Signed)
Advance directive- wife son and daughter all equally designated.

## 2018-08-19 NOTE — Assessment & Plan Note (Signed)
Would continue finasteride and Flomax as is for now.  Defer PSA given his age.  He agrees.

## 2018-08-19 NOTE — Assessment & Plan Note (Signed)
No change in meds.  See notes on labs.  Discussed diet.

## 2018-08-19 NOTE — Telephone Encounter (Signed)
Rx sent electronically.  

## 2018-08-19 NOTE — Assessment & Plan Note (Signed)
Still on iron.  Has GI f/u pending.  Energy level is improved.  See notes on labs.

## 2018-08-19 NOTE — Assessment & Plan Note (Signed)
Able to tolerate statin.  Encourage diet.  No change in meds at this point.  See notes on labs.

## 2018-09-08 ENCOUNTER — Other Ambulatory Visit: Payer: Medicare Other

## 2018-09-25 ENCOUNTER — Telehealth: Payer: Self-pay | Admitting: Family Medicine

## 2018-09-25 NOTE — Telephone Encounter (Signed)
Pt dropped off blood sugar readings  In rx tower up front

## 2018-09-25 NOTE — Telephone Encounter (Signed)
Placed in Dr. Duncan's In Box. 

## 2018-09-27 MED ORDER — GLYBURIDE 5 MG PO TABS: 10.0000 mg | ORAL_TABLET | Freq: Every day | ORAL | Status: DC

## 2018-09-27 NOTE — Telephone Encounter (Signed)
Call patient.  I thank him for the update.  For the month of December almost all of his blood sugar readings are above 200 but less than 300.  His lowest recorded level was 171, which was 1 of the 3 recordings below 200. I would continue taking metformin twice a day.  I would continue taking glyburide 2 tablets in the morning.  I would add on 0.5-1 tablet of glyburide at night and see if he can tolerate that and see if it helps his sugars.  Please update me about his sugar in about another 2 weeks.  If he is having low sugars at all then cut back on the dose and let me know.  I still want to check him and his labs at a visit at the end of February.  30-minute appointment would be good.  Thanks.

## 2018-09-28 NOTE — Telephone Encounter (Signed)
Left message on patient's voicemail to return call

## 2018-09-28 NOTE — Telephone Encounter (Signed)
Patient advised. Appointment scheduled.  

## 2018-10-12 ENCOUNTER — Telehealth: Payer: Self-pay | Admitting: Family Medicine

## 2018-10-12 NOTE — Telephone Encounter (Signed)
Readings placed in Dr. Josefine Class In [place].

## 2018-10-12 NOTE — Telephone Encounter (Signed)
Thanks for the update.  His blood sugar has usually been running slightly above 200 in the morning.  He has similar values in the evening.  None below 150.  Please verify his metformin and glyburide dosing.  Please see specifically how much glyburide he is taking in the evening.  Please let me know.  We will go from there.  Thanks.

## 2018-10-12 NOTE — Telephone Encounter (Signed)
Pt dropped blood sugar readings In dr [name] rx tower up front

## 2018-10-13 NOTE — Telephone Encounter (Signed)
Left detailed message on voicemail to return call with information. 

## 2018-10-13 NOTE — Telephone Encounter (Signed)
Wife states patient is taking Metformin 1 tab twice daily and Glyburide 2 in the AM and 1 in PM.

## 2018-10-15 MED ORDER — GLYBURIDE 5 MG PO TABS: 10.0000 mg | ORAL_TABLET | Freq: Every day | ORAL | Status: DC

## 2018-10-15 NOTE — Telephone Encounter (Signed)
I would continue metformin as is but inc glyburide to 2 tabs in the AM and 1.5-2 tabs in the PM.  See how that does in the meantime and we'll recheck him next month.  If he has concerns then let me know.  Thanks.

## 2018-10-26 DIAGNOSIS — L821 Other seborrheic keratosis: Secondary | ICD-10-CM | POA: Diagnosis not present

## 2018-10-26 DIAGNOSIS — Z85828 Personal history of other malignant neoplasm of skin: Secondary | ICD-10-CM | POA: Diagnosis not present

## 2018-10-26 DIAGNOSIS — L819 Disorder of pigmentation, unspecified: Secondary | ICD-10-CM | POA: Diagnosis not present

## 2018-10-26 DIAGNOSIS — L570 Actinic keratosis: Secondary | ICD-10-CM | POA: Diagnosis not present

## 2018-10-26 DIAGNOSIS — D229 Melanocytic nevi, unspecified: Secondary | ICD-10-CM | POA: Diagnosis not present

## 2018-10-26 DIAGNOSIS — D1801 Hemangioma of skin and subcutaneous tissue: Secondary | ICD-10-CM | POA: Diagnosis not present

## 2018-10-28 ENCOUNTER — Telehealth: Payer: Self-pay | Admitting: Family Medicine

## 2018-10-28 NOTE — Telephone Encounter (Signed)
Pt's wife, Everlene Farrier, stopped in to inquire about a new cardiologists since she received a letter that Dr Wynonia Lawman is retiring. She is asking for a call back. I placed a copy of the letter she received in your in box.

## 2018-10-29 ENCOUNTER — Other Ambulatory Visit: Payer: Self-pay | Admitting: Family Medicine

## 2018-10-29 NOTE — Telephone Encounter (Signed)
I think it would make sense to go with the docs at [facility].  They do a great job and should be able to get the patient an appointment.

## 2018-10-29 NOTE — Telephone Encounter (Signed)
Wife advised. 

## 2018-11-09 DIAGNOSIS — E103293 Type 1 diabetes mellitus with mild nonproliferative diabetic retinopathy without macular edema, bilateral: Secondary | ICD-10-CM | POA: Diagnosis not present

## 2018-11-09 DIAGNOSIS — H353131 Nonexudative age-related macular degeneration, bilateral, early dry stage: Secondary | ICD-10-CM | POA: Diagnosis not present

## 2018-11-09 DIAGNOSIS — Z961 Presence of intraocular lens: Secondary | ICD-10-CM | POA: Diagnosis not present

## 2018-11-09 DIAGNOSIS — H52203 Unspecified astigmatism, bilateral: Secondary | ICD-10-CM | POA: Diagnosis not present

## 2018-11-09 LAB — HM DIABETES EYE EXAM

## 2018-11-17 ENCOUNTER — Encounter: Payer: Self-pay | Admitting: Family Medicine

## 2018-11-19 ENCOUNTER — Ambulatory Visit (INDEPENDENT_AMBULATORY_CARE_PROVIDER_SITE_OTHER): Payer: Medicare Other | Admitting: Family Medicine

## 2018-11-19 ENCOUNTER — Encounter: Payer: Self-pay | Admitting: Family Medicine

## 2018-11-19 VITALS — BP 140/70 | HR 61 | Temp 97.8°F | Ht 64.0 in | Wt 164.2 lb

## 2018-11-19 DIAGNOSIS — R269 Unspecified abnormalities of gait and mobility: Secondary | ICD-10-CM

## 2018-11-19 DIAGNOSIS — R809 Proteinuria, unspecified: Secondary | ICD-10-CM | POA: Diagnosis not present

## 2018-11-19 DIAGNOSIS — E1129 Type 2 diabetes mellitus with other diabetic kidney complication: Secondary | ICD-10-CM | POA: Diagnosis not present

## 2018-11-19 DIAGNOSIS — D649 Anemia, unspecified: Secondary | ICD-10-CM | POA: Diagnosis not present

## 2018-11-19 DIAGNOSIS — D509 Iron deficiency anemia, unspecified: Secondary | ICD-10-CM

## 2018-11-19 LAB — CBC WITH DIFFERENTIAL/PLATELET
Basophils Absolute: 0.1 10*3/uL (ref 0.0–0.1)
Basophils Relative: 1 % (ref 0.0–3.0)
Eosinophils Absolute: 0.1 10*3/uL (ref 0.0–0.7)
Eosinophils Relative: 2.3 % (ref 0.0–5.0)
HCT: 33.7 % — ABNORMAL LOW (ref 39.0–52.0)
Hemoglobin: 10.9 g/dL — ABNORMAL LOW (ref 13.0–17.0)
Lymphocytes Relative: 37.5 % (ref 12.0–46.0)
Lymphs Abs: 2.2 10*3/uL (ref 0.7–4.0)
MCHC: 32.4 g/dL (ref 30.0–36.0)
MCV: 76.4 fl — ABNORMAL LOW (ref 78.0–100.0)
Monocytes Absolute: 0.6 10*3/uL (ref 0.1–1.0)
Monocytes Relative: 10.9 % (ref 3.0–12.0)
Neutro Abs: 2.9 10*3/uL (ref 1.4–7.7)
Neutrophils Relative %: 48.3 % (ref 43.0–77.0)
Platelets: 171 10*3/uL (ref 150.0–400.0)
RBC: 4.42 Mil/uL (ref 4.22–5.81)
RDW: 18.1 % — ABNORMAL HIGH (ref 11.5–15.5)
WBC: 5.9 10*3/uL (ref 4.0–10.5)

## 2018-11-19 LAB — IRON: Iron: 29 ug/dL — ABNORMAL LOW (ref 42–165)

## 2018-11-19 LAB — HEMOGLOBIN A1C: Hgb A1c MFr Bld: 8 % — ABNORMAL HIGH (ref 4.6–6.5)

## 2018-11-19 MED ORDER — FERROUS SULFATE 325 (65 FE) MG PO TABS: 325.0000 mg | ORAL_TABLET | Freq: Every day | ORAL | Status: AC

## 2018-11-19 MED ORDER — PRAVASTATIN SODIUM 20 MG PO TABS: 20.0000 mg | ORAL_TABLET | Freq: Every day | ORAL | 3 refills | Status: DC

## 2018-11-19 MED ORDER — TAMSULOSIN HCL 0.4 MG PO CAPS: 0.4000 mg | ORAL_CAPSULE | Freq: Every day | ORAL | 3 refills | Status: DC

## 2018-11-19 NOTE — Progress Notes (Signed)
Diabetes:  Using medications without difficulties: yes Hypoglycemic episodes:no Hyperglycemic episodes: see below Feet problems: no pain and only occ minimal tingling.  Blood Sugars averaging: none <150, most >200.   eye exam within last year: yes Labs pending.    Anemia.  Due for f/u labs.  He has been on iron daily.  No bleeding.    Walking with cane.  Parking sticker done.  He has trouble walking distance.    Discussed his ongoing care concerns with patient and his son [name] at the office visit.  They both also updated me about the patient's wife, who has had some memory changes.  I asked them to encourage her to come in for an office visit.  Patient gave verbal okay for [place] to call his son [name] at [phone] with lab results.  I asked him to update his DPR on the way out of clinic today.  Meds, vitals, and allergies reviewed.   ROS: Per HPI unless specifically indicated in ROS section   GEN: nad, alert and oriented HEENT: mucous membranes moist NECK: supple w/o LA CV: rrr. PULM: ctab, no inc wob ABD: soft, +bs EXT: no edema SKIN: no acute rash Walking with cane.

## 2018-11-19 NOTE — Patient Instructions (Signed)
Ask the dermatology clinic to send me your notes when you go back.   Go to the lab on the way out.  We'll contact you with your lab report. Don't change your meds for now.  We'll see about stopping/continuing the iron after I see your labs.  Take care.  Glad to see you.

## 2018-11-22 ENCOUNTER — Other Ambulatory Visit: Payer: Self-pay | Admitting: Family Medicine

## 2018-11-22 DIAGNOSIS — E1129 Type 2 diabetes mellitus with other diabetic kidney complication: Secondary | ICD-10-CM

## 2018-11-22 DIAGNOSIS — R809 Proteinuria, unspecified: Secondary | ICD-10-CM

## 2018-11-22 DIAGNOSIS — D509 Iron deficiency anemia, unspecified: Secondary | ICD-10-CM

## 2018-11-22 DIAGNOSIS — R269 Unspecified abnormalities of gait and mobility: Secondary | ICD-10-CM | POA: Insufficient documentation

## 2018-11-22 NOTE — Assessment & Plan Note (Signed)
With history of spinal stenosis noted.  No frank weakness but needs to walk with a cane for improved safety.  Parking sticker done.

## 2018-11-22 NOTE — Assessment & Plan Note (Signed)
He has been taking iron daily.  Due for follow-up labs.  No bleeding.

## 2018-11-22 NOTE — Assessment & Plan Note (Addendum)
See notes on labs.  No change in meds at this point.  I want to avoid hypoglycemia.  Rationale discussed with patient.  He agrees.  >25 minutes spent in face to face time with patient, >50% spent in counselling or coordination of care.

## 2018-11-30 ENCOUNTER — Other Ambulatory Visit: Payer: Self-pay | Admitting: *Deleted

## 2018-11-30 MED ORDER — PANTOPRAZOLE SODIUM 40 MG PO TBEC: 40.0000 mg | DELAYED_RELEASE_TABLET | Freq: Every day | ORAL | 3 refills | Status: DC

## 2018-11-30 MED ORDER — TAMSULOSIN HCL 0.4 MG PO CAPS: 0.4000 mg | ORAL_CAPSULE | Freq: Every day | ORAL | 3 refills | Status: DC

## 2018-11-30 MED ORDER — PRAVASTATIN SODIUM 20 MG PO TABS: 20.0000 mg | ORAL_TABLET | Freq: Every day | ORAL | 3 refills | Status: DC

## 2018-12-07 NOTE — Progress Notes (Signed)
Cardiology Office Note:    Date:  12/08/2018   ID:  Paul Terrell, DOB Mar 02, 1931, MRN [id number]  PCP:  Paul Ghent, MD  Cardiologist:  Paul More, MD    Referring MD: Paul Ghent, MD    ASSESSMENT:    1. Coronary artery disease of native artery of native heart with stable angina pectoris ([place])   2. Hypertensive heart disease without heart failure   3. Mixed hyperlipidemia    PLAN:    In order of problems listed above:  1. His CAD is stable New York Heart Association class I he currently is not having angina with his current regimen including aspirin beta-blocker statin has nitroglycerin at home and at this time I certainly would not advise an ischemia evaluation. 2. Stable hypertension continue treatment including ACE inhibitor with CAD 3. Dyslipidemia stable most recent lipid profile I can access was 08/17/2018 total cholesterol 128 HDL 41 LDL 59 ideal on a low intensity statin mildly anemic hemoglobin 10.9 and his diabetes out-of-control with an A1c of 8.0.  His transaminase was normal.   Next appointment: 6 months   Medication Adjustments/Labs and Tests Ordered: Current medicines are reviewed at length with the patient today.  Concerns regarding medicines are outlined above.  No orders of the defined types were placed in this encounter.  No orders of the defined types were placed in this encounter.   Chief Complaint  Patient presents with  . Follow-up    for   . Coronary Artery Disease    History of Present Illness:    Paul Terrell is a 85 y.o. male with a hx of coronary artery disease with PCI , type 2 diabetes with neuropathy hypertension and hyperlipidemia.    He was last seen one year ago. Compliance with diet, lifestyle and medications: yes  Paul Terrell is seen in the office along with his father overall is done well he has a mild degree of peripheral edema which is a chronic problem he has had no orthopnea shortness of breath no angina dyspnea palpitation  or syncope.  They are having problems with behavior he wants to drive a farm tractor and drive a car and the son thinks that she had a good idea.  Recent labs were performed last week with his PCP and I am unable to see the results the son tells me he was told that they were in a good range. Past Medical History:  Diagnosis Date  . Arthritis   . Basal cell carcinoma of scalp 12/16/07   Reexcision (Dr. Merril Abbe. Teressa Senter)  . BPH (benign prostatic hyperplasia)   . CAD (coronary artery disease) 11/01   stent placed  . Cataract   . Diabetes mellitus type II 11/01  . GERD (gastroesophageal reflux disease)    occasionally  . Hearing aid worn   . Hyperlipemia 11/01  . Hypertension 11/01  . Ruptured disc, cervical    Neck C7 (Dr. Pearlie Oyster)    Past Surgical History:  Procedure Laterality Date  . BASAL CELL CARCINOMA EXCISION    . BRONCHIAL BRUSHINGS  05/16/2018   Procedure: ESOPHAGEAL BRUSHINGS;  Surgeon: Irving Copas., MD;  Location: [facility];  Service: Gastroenterology;;  . Lillard Anes  02/2000   Dr. Janus Molder - Right  . CERVICAL FUSION    . COLONOSCOPY     2012  . COLONOSCOPY N/A 05/17/2018   Procedure: COLONOSCOPY;  Surgeon: Mansouraty, Telford Nab., MD;  Location: [facility];  Service: Gastroenterology;  Laterality: N/A;  .  ESOPHAGOGASTRODUODENOSCOPY (EGD) WITH PROPOFOL N/A 05/16/2018   Procedure: ESOPHAGOGASTRODUODENOSCOPY (EGD) WITH PROPOFOL ;  Surgeon: Rush Landmark Telford Nab., MD;  Location: [facility];  Service: Gastroenterology;  Laterality: N/A;  . HOT HEMOSTASIS N/A 05/16/2018   Procedure: HOT HEMOSTASIS (ARGON PLASMA COAGULATION/BICAP);  Surgeon: Irving Copas., MD;  Location: [facility];  Service: Gastroenterology;  Laterality: N/A;  . KNEE ARTHROSCOPY  09/1999   Right  . LACERATION REPAIR  09/1999   Right Hand (Dr. Fredna Dow)  . LITHOTRIPSY  1991   Dr. Elyse Jarvis  . POLYPECTOMY  05/17/2018   Procedure: POLYPECTOMY;  Surgeon: Irving Copas.,  MD;  Location: [facility];  Service: Gastroenterology;;  . Cherre Robins CUFF REPAIR  02/05   Left    Current Medications: Current Meds  Medication Sig  . acetaminophen (TYLENOL) 500 MG tablet Take 500 mg by mouth every 6 (six) hours as needed (for pain or headaches).  Marland Kitchen aspirin 81 MG tablet Take 81 mg by mouth at bedtime.   . Cholecalciferol (VITAMIN D-3) 5000 UNITS TABS Take 5,000 Units by mouth daily.   . ferrous sulfate 325 (65 FE) MG tablet Take 1 tablet (325 mg total) by mouth daily with breakfast.  . finasteride (PROSCAR) 5 MG tablet Take 1 tablet (5 mg total) by mouth at bedtime.  Marland Kitchen glucose blood (ONE TOUCH ULTRA TEST) test strip TEST BLOOD SUGAR ONCE DAILY. DX: 250.00  . glyBURIDE (DIABETA) 5 MG tablet Take 2 tablets (10 mg total) by mouth daily with breakfast. With a 1.5-2 tablets at night.  . Lancets (ONETOUCH ULTRASOFT) lancets Test once daily Dx 250.00   . lisinopril (PRINIVIL,ZESTRIL) 2.5 MG tablet Take 1 tablet (2.5 mg total) by mouth daily.  . metFORMIN (GLUCOPHAGE) 1000 MG tablet TAKE 1 TABLET TWICE DAILY  . metoprolol succinate (TOPROL-XL) 50 MG 24 hr tablet Take 1 tablet (50 mg total) by mouth daily.  . Multiple Vitamin (MULTIVITAMIN) tablet Take 1 tablet by mouth daily.    . pantoprazole (PROTONIX) 40 MG tablet Take 1 tablet (40 mg total) by mouth daily.  . pravastatin (PRAVACHOL) 20 MG tablet Take 1 tablet (20 mg total) by mouth at bedtime.  . tamsulosin (FLOMAX) 0.4 MG CAPS capsule Take 1 capsule (0.4 mg total) by mouth at bedtime.  . vitamin B-12 (CYANOCOBALAMIN) 250 MCG tablet Take 250 mcg by mouth 2 (two) times daily.      Allergies:   Nsaids; Citrus; and Zocor [simvastatin]   Social History   Socioeconomic History  . Marital status: Married    Spouse name: Not on file  . Number of children: 2  . Years of education: Not on file  . Highest education level: Not on file  Occupational History  . Occupation: Carpentry  Social Needs  . Financial resource strain:  Not on file  . Food insecurity:    Worry: Not on file    Inability: Not on file  . Transportation needs:    Medical: Not on file    Non-medical: Not on file  Tobacco Use  . Smoking status: Former Smoker    Packs/day: 1.00    Years: 25.00    Pack years: 25.00    Types: Cigarettes    Last attempt to quit: 01/09/1976    Years since quitting: 42.9  . Smokeless tobacco: Current User    Types: Chew  . Tobacco comment: quit smoking about 25 years ago  Substance and Sexual Activity  . Alcohol use: No  . Drug use: No  . Sexual activity: Never  Lifestyle  . Physical activity:    Days per week: Not on file    Minutes per session: Not on file  . Stress: Not on file  Relationships  . Social connections:    Talks on phone: Not on file    Gets together: Not on file    Attends religious service: Not on file    Active member of club or organization: Not on file    Attends meetings of clubs or organizations: Not on file    Relationship status: Not on file  Other Topics Concern  . Not on file  Social History Narrative   Married 1955   2 kids   Retired from Therapist, art     Family History: The patient's family history includes Heart disease in his brother, brother, father, and mother; Hyperlipidemia in his brother; Hypertension in his brother, father, mother, and sister; Stroke in his mother. ROS:   Please see the history of present illness.    All other systems reviewed and are negative.  EKGs/Labs/Other Studies Reviewed:    The following studies were reviewed today:  EKG:  EKG ordered today and personally reviewed.  The ekg ordered today demonstrates [place] lateral ST/T reolarization v ischemia  Recent Labs: 08/17/2018: ALT 19; BUN 16; Creatinine, Ser 0.90; Potassium 4.5; Sodium 137 11/19/2018: Hemoglobin 10.9; Platelets 171.0  Recent Lipid Panel    Component Value Date/Time   CHOL 128 08/17/2018 1142   TRIG 134.0 08/17/2018 1142   HDL 41.90 08/17/2018 1142   CHOLHDL  3 08/17/2018 1142   VLDL 26.8 08/17/2018 1142   LDLCALC 59 08/17/2018 1142    Physical Exam:    VS:  BP (!) 152/72 (BP Location: Right Arm, Patient Position: Sitting, Cuff Size: Normal)   Pulse (!) 57   Ht 5\' 4"  (1.626 m)   Wt 165 lb 6.4 oz (75 kg)   SpO2 96%   BMI 28.39 kg/m     Wt Readings from Last 3 Encounters:  12/08/18 165 lb 6.4 oz (75 kg)  11/19/18 164 lb 3.2 oz (74.5 kg)  08/18/18 157 lb 6 oz (71.4 kg)     GEN:  Well nourished, well developed in no acute distress HEENT: Normal NECK: No JVD; No carotid bruits LYMPHATICS: No lymphadenopathy CARDIAC: RRR, no murmurs, rubs, gallops RESPIRATORY:  Clear to auscultation without rales, wheezing or rhonchi  ABDOMEN: Soft, non-tender, non-distended MUSCULOSKELETAL:  No edema; No deformity  SKIN: Warm and dry NEUROLOGIC:  Alert and oriented x 3 PSYCHIATRIC:  Normal affect    Signed, Paul More, MD  12/08/2018 10:06 AM    [place]

## 2018-12-08 ENCOUNTER — Ambulatory Visit (INDEPENDENT_AMBULATORY_CARE_PROVIDER_SITE_OTHER): Payer: Medicare Other | Admitting: Cardiology

## 2018-12-08 ENCOUNTER — Other Ambulatory Visit: Payer: Self-pay

## 2018-12-08 ENCOUNTER — Encounter: Payer: Self-pay | Admitting: Cardiology

## 2018-12-08 VITALS — BP 152/72 | HR 57 | Ht 64.0 in | Wt 165.4 lb

## 2018-12-08 DIAGNOSIS — I25118 Atherosclerotic heart disease of native coronary artery with other forms of angina pectoris: Secondary | ICD-10-CM

## 2018-12-08 DIAGNOSIS — E782 Mixed hyperlipidemia: Secondary | ICD-10-CM | POA: Diagnosis not present

## 2018-12-08 DIAGNOSIS — I119 Hypertensive heart disease without heart failure: Secondary | ICD-10-CM | POA: Diagnosis not present

## 2018-12-08 NOTE — Patient Instructions (Signed)
Medication Instructions:  Your physician recommends that you continue on your current medications as directed. Please refer to the Current Medication list given to you today.  If you need a refill on your cardiac medications before your next appointment, please call your pharmacy.   Lab work: None  If you have labs (blood work) drawn today and your tests are completely normal, you will receive your results only by: . MyChart Message (if you have MyChart) OR . A paper copy in the mail If you have any lab test that is abnormal or we need to change your treatment, we will call you to review the results.  Testing/Procedures: You had an EKG today.   Follow-Up: At [facility], you and your health needs are our priority.  As part of our continuing mission to provide you with exceptional heart care, we have created designated Provider Care Teams.  These Care Teams include your primary Cardiologist (physician) and Advanced Practice Providers (APPs -  Physician Assistants and Nurse Practitioners) who all work together to provide you with the care you need, when you need it. You will need a follow up appointment in 1 years.  Please call our office 2 months in advance to schedule this appointment.    

## 2019-02-10 ENCOUNTER — Other Ambulatory Visit: Payer: Self-pay

## 2019-02-10 ENCOUNTER — Encounter: Payer: Self-pay | Admitting: Internal Medicine

## 2019-02-10 ENCOUNTER — Ambulatory Visit (INDEPENDENT_AMBULATORY_CARE_PROVIDER_SITE_OTHER): Payer: Medicare Other | Admitting: Internal Medicine

## 2019-02-10 VITALS — BP 144/70 | HR 66 | Temp 97.6°F

## 2019-02-10 DIAGNOSIS — S41111A Laceration without foreign body of right upper arm, initial encounter: Secondary | ICD-10-CM | POA: Diagnosis not present

## 2019-02-10 DIAGNOSIS — W19XXXA Unspecified fall, initial encounter: Secondary | ICD-10-CM

## 2019-02-10 DIAGNOSIS — S51011S Laceration without foreign body of right elbow, sequela: Secondary | ICD-10-CM | POA: Diagnosis not present

## 2019-02-10 DIAGNOSIS — I25118 Atherosclerotic heart disease of native coronary artery with other forms of angina pectoris: Secondary | ICD-10-CM | POA: Diagnosis not present

## 2019-02-10 DIAGNOSIS — E119 Type 2 diabetes mellitus without complications: Secondary | ICD-10-CM | POA: Diagnosis not present

## 2019-02-10 DIAGNOSIS — S41112A Laceration without foreign body of left upper arm, initial encounter: Secondary | ICD-10-CM | POA: Diagnosis not present

## 2019-02-10 DIAGNOSIS — S51812A Laceration without foreign body of left forearm, initial encounter: Secondary | ICD-10-CM

## 2019-02-10 NOTE — Progress Notes (Signed)
Subjective:    Patient ID: Paul Terrell, male    DOB: [birth year], 85 y.o.   MRN: [id number]  HPI  Pt presents to the clinic today with c/o 2 falls in the last 2 days. He denies any pain but reports he has abrasions to bilateral arms and upper back. They cleansed the area with soap and water. His wife has not noticed any drainage from the area. She is concerned because he has a history of DM 2. Last A1C 8%, 10/2018. He is taking Metformin and Glyburide as prescribed. He does not check his sugars. He denies fever or chills.  Review of Systems      Past Medical History:  Diagnosis Date   Arthritis    Basal cell carcinoma of scalp 12/16/07   Reexcision (Dr. Merril Abbe. Teressa Senter)   BPH (benign prostatic hyperplasia)    CAD (coronary artery disease) 11/01   stent placed   Cataract    Diabetes mellitus type II 11/01   GERD (gastroesophageal reflux disease)    occasionally   Hearing aid worn    Hyperlipemia 11/01   Hypertension 11/01   Ruptured disc, cervical    Neck C7 (Dr. Pearlie Oyster)    Current Outpatient Medications  Medication Sig Dispense Refill   acetaminophen (TYLENOL) 500 MG tablet Take 500 mg by mouth every 6 (six) hours as needed (for pain or headaches).     aspirin 81 MG tablet Take 81 mg by mouth at bedtime.      Cholecalciferol (VITAMIN D-3) 5000 UNITS TABS Take 5,000 Units by mouth daily.      ferrous sulfate 325 (65 FE) MG tablet Take 1 tablet (325 mg total) by mouth daily with breakfast.     finasteride (PROSCAR) 5 MG tablet Take 1 tablet (5 mg total) by mouth at bedtime.     glucose blood (ONE TOUCH ULTRA TEST) test strip TEST BLOOD SUGAR ONCE DAILY. DX: 250.00     glyBURIDE (DIABETA) 5 MG tablet Take 2 tablets (10 mg total) by mouth daily with breakfast. With a 1.5-2 tablets at night.     Lancets (ONETOUCH ULTRASOFT) lancets Test once daily Dx 250.00      lisinopril (PRINIVIL,ZESTRIL) 2.5 MG tablet Take 1 tablet (2.5 mg total) by mouth daily. 90  tablet 3   metFORMIN (GLUCOPHAGE) 1000 MG tablet TAKE 1 TABLET TWICE DAILY 180 tablet 1   metoprolol succinate (TOPROL-XL) 50 MG 24 hr tablet Take 1 tablet (50 mg total) by mouth daily. 90 tablet 3   Multiple Vitamin (MULTIVITAMIN) tablet Take 1 tablet by mouth daily.       pantoprazole (PROTONIX) 40 MG tablet Take 1 tablet (40 mg total) by mouth daily. 90 tablet 3   pravastatin (PRAVACHOL) 20 MG tablet Take 1 tablet (20 mg total) by mouth at bedtime. 90 tablet 3   tamsulosin (FLOMAX) 0.4 MG CAPS capsule Take 1 capsule (0.4 mg total) by mouth at bedtime. 90 capsule 3   vitamin B-12 (CYANOCOBALAMIN) 250 MCG tablet Take 250 mcg by mouth 2 (two) times daily.      No current facility-administered medications for this visit.     Allergies  Allergen Reactions   Nsaids Other (See Comments)    H/o anemia   Citrus Hives   Zocor [Simvastatin] Other (See Comments)    Myalgia     Family History  Problem Relation Age of Onset   Stroke Mother        Lived 2 years   Heart disease Mother  CAD, Angioplasty X 2   Hypertension Mother    Heart disease Father        MI   Hypertension Father    Hypertension Sister    Hypertension Brother    Heart disease Brother        MI   Hyperlipidemia Brother    Heart disease Brother        MI    Social History   Socioeconomic History   Marital status: Married    Spouse name: Not on file   Number of children: 2   Years of education: Not on file   Highest education level: Not on file  Occupational History   Occupation: Public relations account executive strain: Not on file   Food insecurity:    Worry: Not on file    Inability: Not on file   Transportation needs:    Medical: Not on file    Non-medical: Not on file  Tobacco Use   Smoking status: Former Smoker    Packs/day: 1.00    Years: 25.00    Pack years: 25.00    Types: Cigarettes    Last attempt to quit: 01/09/1976    Years since quitting:  43.1   Smokeless tobacco: Current User    Types: Chew   Tobacco comment: quit smoking about 25 years ago  Substance and Sexual Activity   Alcohol use: No   Drug use: No   Sexual activity: Never  Lifestyle   Physical activity:    Days per week: Not on file    Minutes per session: Not on file   Stress: Not on file  Relationships   Social connections:    Talks on phone: Not on file    Gets together: Not on file    Attends religious service: Not on file    Active member of club or organization: Not on file    Attends meetings of clubs or organizations: Not on file    Relationship status: Not on file   Intimate partner violence:    Fear of current or ex partner: Not on file    Emotionally abused: Not on file    Physically abused: Not on file    Forced sexual activity: Not on file  Other Topics Concern   Not on file  Social History Narrative   Married 1955   2 kids   Retired from general [employer]     Constitutional: Denies fever, malaise, fatigue, headache or abrupt weight changes.  Respiratory: Denies difficulty breathing, shortness of breath, cough or sputum production.   Cardiovascular: Denies chest pain, chest tightness, palpitations or swelling in the hands or feet.  Musculoskeletal: Denies decrease in range of motion, difficulty with gait, muscle pain or joint pain and swelling.  Skin: Pt reports multiple skin tears. Denies redness, rashes, lesions.  Neurological: Pt reports difficulty with balance. Denies dizziness, difficulty with memory, difficulty with speech or problems coordination.   No other specific complaints in a complete review of systems (except as listed in HPI above).  Objective:   Physical Exam   BP (!) 144/70    Pulse 66    Temp 97.6 F (36.4 C) (Oral)    SpO2 98%  Wt Readings from Last 3 Encounters:  12/08/18 165 lb 6.4 oz (75 kg)  11/19/18 164 lb 3.2 oz (74.5 kg)  08/18/18 157 lb 6 oz (71.4 kg)    General: Appears his stated age,  well developed, well nourished in NAD. Skin:  Skin tear noted of right elbow, right posterior shoulder. 3 small skin tears noted of left upper posterior arm. Large skin tear noted of left forearm. No drainage, redness or warmth noted. Musculoskeletal: Moves all 4 extremities without pain. No joint swelling noted. Gait slow and steady. Neurological: Alert and oriented.   BMET    Component Value Date/Time   NA 137 08/17/2018 1142   K 4.5 08/17/2018 1142   CL 102 08/17/2018 1142   CO2 27 08/17/2018 1142   GLUCOSE 214 (H) 08/17/2018 1142   BUN 16 08/17/2018 1142   CREATININE 0.90 08/17/2018 1142   CALCIUM 9.7 08/17/2018 1142   GFRNONAA >60 05/15/2018 0304   GFRAA >60 05/15/2018 0304    Lipid Panel     Component Value Date/Time   CHOL 128 08/17/2018 1142   TRIG 134.0 08/17/2018 1142   HDL 41.90 08/17/2018 1142   CHOLHDL 3 08/17/2018 1142   VLDL 26.8 08/17/2018 1142   LDLCALC 59 08/17/2018 1142    CBC    Component Value Date/Time   WBC 5.9 11/19/2018 1433   RBC 4.42 11/19/2018 1433   HGB 10.9 (L) 11/19/2018 1433   HCT 33.7 (L) 11/19/2018 1433   PLT 171.0 11/19/2018 1433   MCV 76.4 (L) 11/19/2018 1433   MCH 20.7 (L) 05/17/2018 0305   MCHC 32.4 11/19/2018 1433   RDW 18.1 (H) 11/19/2018 1433   LYMPHSABS 2.2 11/19/2018 1433   MONOABS 0.6 11/19/2018 1433   EOSABS 0.1 11/19/2018 1433   BASOSABS 0.1 11/19/2018 1433    Hgb A1C Lab Results  Component Value Date   HGBA1C 8.0 (H) 11/19/2018           Assessment & Plan:   Skin Tear of Bilateral Upper Arms s/p Fall:  Skin tears cleansed, applied TAB, Telfa and wrapped with Kerlex They will change dressings after each time he bathes Advised to avoid scrubbing the area Monitor for pain, redness, drainage, swelling or warmth No prophylactic abx indicated at this time Will monitor  Return precautions discussed Webb Silversmith, NP

## 2019-02-10 NOTE — Patient Instructions (Signed)
Skin Tear Care  A skin tear is a wound in which the top layer of skin peels off. To repair the skin, your doctor may use:   Tape.   Skin adhesive strips.  A bandage (dressing) may also be placed over the tape or skin adhesive strips.  How to care for your skin tear   Clean the wound as told by your doctor. You may be told to keep the wound dry for the first few days. If you are told to clean the wound:  ? Wash the wound with mild soap and water or a salt-water (saline) solution.  ? Rinse the wound with water to remove all soap.  ? Do not rub the wound dry. Let the wound air dry.   Change any dressings as told by your doctor. This includes changing the dressing if it gets wet, gets dirty, or starts to smell bad.   Do not scratch or pick at the wound.   Protect the injured area until it has healed.   Check your wound every day for signs of infection. Check for:  ? More redness, swelling, or pain.  ? More fluid or blood.  ? Warmth.  ? Pus or a bad smell.  Medicines     Take over-the-counter and prescription medicines only as told by your doctor.   If you were prescribed an antibiotic medicine, take or apply it as told by your doctor. Do not stop using the antibiotic even if your condition gets better.  General instructions   Keep the bandage dry as told by your doctor.   Do not take baths, swim, or do anything that puts your wound underwater until your doctor says it is okay.   Keep all follow-up visits as told by your doctor. This is important.  Contact a doctor if:   You have more redness, swelling, or pain around your wound.   You have more fluid or blood coming from your wound.   Your wound feels warm to the touch.   You have pus or a bad smell coming from your wound.  Get help right away if:   You have a red streak that goes away from the skin tear.   You have a fever and chills and your symptoms suddenly get worse.  This information is not intended to replace advice given to you by your health  care provider. Make sure you discuss any questions you have with your health care provider.  Document Released: 06/18/2008 Document Revised: 05/05/2016 Document Reviewed: 07/31/2015  Elsevier Interactive Patient Education  2019 [employer]

## 2019-02-16 ENCOUNTER — Other Ambulatory Visit (INDEPENDENT_AMBULATORY_CARE_PROVIDER_SITE_OTHER): Payer: Medicare Other

## 2019-02-16 DIAGNOSIS — R809 Proteinuria, unspecified: Secondary | ICD-10-CM

## 2019-02-16 DIAGNOSIS — E1129 Type 2 diabetes mellitus with other diabetic kidney complication: Secondary | ICD-10-CM

## 2019-02-16 DIAGNOSIS — D509 Iron deficiency anemia, unspecified: Secondary | ICD-10-CM

## 2019-02-16 LAB — CBC WITH DIFFERENTIAL/PLATELET
Basophils Absolute: 0.1 10*3/uL (ref 0.0–0.1)
Basophils Relative: 1 % (ref 0.0–3.0)
Eosinophils Absolute: 0.2 10*3/uL (ref 0.0–0.7)
Eosinophils Relative: 3.8 % (ref 0.0–5.0)
HCT: 33.4 % — ABNORMAL LOW (ref 39.0–52.0)
Hemoglobin: 11.2 g/dL — ABNORMAL LOW (ref 13.0–17.0)
Lymphocytes Relative: 36.4 % (ref 12.0–46.0)
Lymphs Abs: 2.1 10*3/uL (ref 0.7–4.0)
MCHC: 33.6 g/dL (ref 30.0–36.0)
MCV: 81.6 fl (ref 78.0–100.0)
Monocytes Absolute: 0.6 10*3/uL (ref 0.1–1.0)
Monocytes Relative: 10.6 % (ref 3.0–12.0)
Neutro Abs: 2.8 10*3/uL (ref 1.4–7.7)
Neutrophils Relative %: 48.2 % (ref 43.0–77.0)
Platelets: 155 10*3/uL (ref 150.0–400.0)
RBC: 4.09 Mil/uL — ABNORMAL LOW (ref 4.22–5.81)
RDW: 16.5 % — ABNORMAL HIGH (ref 11.5–15.5)
WBC: 5.9 10*3/uL (ref 4.0–10.5)

## 2019-02-16 LAB — BASIC METABOLIC PANEL
BUN: 11 mg/dL (ref 6–23)
CO2: 28 mEq/L (ref 19–32)
Calcium: 8.8 mg/dL (ref 8.4–10.5)
Chloride: 101 mEq/L (ref 96–112)
Creatinine, Ser: 0.84 mg/dL (ref 0.40–1.50)
GFR: 86.67 mL/min (ref >60.00)
Glucose, Bld: 177 mg/dL — ABNORMAL HIGH (ref 70–99)
Potassium: 4.1 mEq/L (ref 3.5–5.1)
Sodium: 136 mEq/L (ref 135–145)

## 2019-02-16 LAB — HEMOGLOBIN A1C: Hgb A1c MFr Bld: 8.3 % — ABNORMAL HIGH (ref 4.6–6.5)

## 2019-02-16 LAB — IRON: Iron: 66 ug/dL (ref 42–165)

## 2019-02-17 ENCOUNTER — Other Ambulatory Visit: Payer: Self-pay | Admitting: Family Medicine

## 2019-02-18 ENCOUNTER — Encounter: Payer: Self-pay | Admitting: Family Medicine

## 2019-02-18 ENCOUNTER — Ambulatory Visit (INDEPENDENT_AMBULATORY_CARE_PROVIDER_SITE_OTHER): Payer: Medicare Other | Admitting: Family Medicine

## 2019-02-18 DIAGNOSIS — E1129 Type 2 diabetes mellitus with other diabetic kidney complication: Secondary | ICD-10-CM

## 2019-02-18 DIAGNOSIS — I25118 Atherosclerotic heart disease of native coronary artery with other forms of angina pectoris: Secondary | ICD-10-CM

## 2019-02-18 DIAGNOSIS — D649 Anemia, unspecified: Secondary | ICD-10-CM | POA: Diagnosis not present

## 2019-02-18 DIAGNOSIS — R809 Proteinuria, unspecified: Secondary | ICD-10-CM | POA: Diagnosis not present

## 2019-02-18 NOTE — Progress Notes (Signed)
Virtual visit completed through WebEx or similar program Patient location: home  Provider location: [place] at [facility], office   Limitations and rationale for visit method d/w patient.  Patient agreed to proceed.   CC: follow up.    HPI:  Pandemic cautions d/w pt.  Fall cautions d/w pt.    "I feel good."   Anemia history noted.  Labs discussed with patient. Iron wnl HGB stable No ADE on meds.    Diabetes:  Using medications without difficulties: yes Hypoglycemic episodes:no Hyperglycemic episodes:no Feet problems: some occ tingling R foot at the R 1st toe. D/w pt and family about monitoring.     Blood Sugars averaging: 178 this AM.   eye exam within last year: yes A1c still close to prev, 8.3  Goal to avoid hypoglycemia.   BMET wnl except for sugar elevation as expected.    Meds and allergies reviewed.   ROS: Per HPI unless specifically indicated in ROS section   NAD Speech wnl  A/P:  Anemia history noted.  Labs discussed with patient. Iron wnl HGB stable No ADE on meds.   No change in meds and recheck in about 3 months.    Diabetes:  A1c still close to prev, 8.3  Goal to avoid hypoglycemia.  BMET wnl except for sugar elevation as expected.   No change in meds and recheck in about 3 months.

## 2019-02-21 NOTE — Assessment & Plan Note (Signed)
Anemia history noted.  Labs discussed with patient. Iron wnl HGB stable No ADE on meds.   No change in meds and recheck in about 3 months.

## 2019-02-21 NOTE — Assessment & Plan Note (Signed)
A1c still close to prev, 8.3  Goal to avoid hypoglycemia.  BMET wnl except for sugar elevation as expected.   No change in meds and recheck in about 3 months.

## 2019-02-22 ENCOUNTER — Encounter: Payer: Self-pay | Admitting: *Deleted

## 2019-02-22 ENCOUNTER — Telehealth: Payer: Self-pay | Admitting: Family Medicine

## 2019-02-22 NOTE — Telephone Encounter (Signed)
Left voicemail for pt to call us back and schedule appt.   Tonia Ghent, MD  Genella Rife [facility]        Please set up recheck in about 3 months.  Needs labs ahead of time. Thanks.

## 2019-03-03 ENCOUNTER — Other Ambulatory Visit: Payer: Self-pay | Admitting: Family Medicine

## 2019-05-21 ENCOUNTER — Other Ambulatory Visit: Payer: Self-pay | Admitting: Family Medicine

## 2019-05-28 DIAGNOSIS — Z23 Encounter for immunization: Secondary | ICD-10-CM | POA: Diagnosis not present

## 2019-06-16 ENCOUNTER — Other Ambulatory Visit: Payer: Self-pay | Admitting: Family Medicine

## 2019-06-23 DIAGNOSIS — Z85828 Personal history of other malignant neoplasm of skin: Secondary | ICD-10-CM | POA: Diagnosis not present

## 2019-06-23 DIAGNOSIS — C44622 Squamous cell carcinoma of skin of right upper limb, including shoulder: Secondary | ICD-10-CM | POA: Diagnosis not present

## 2019-06-23 DIAGNOSIS — L905 Scar conditions and fibrosis of skin: Secondary | ICD-10-CM | POA: Diagnosis not present

## 2019-06-23 DIAGNOSIS — C44722 Squamous cell carcinoma of skin of right lower limb, including hip: Secondary | ICD-10-CM | POA: Diagnosis not present

## 2019-07-07 DIAGNOSIS — L570 Actinic keratosis: Secondary | ICD-10-CM | POA: Diagnosis not present

## 2019-07-30 ENCOUNTER — Other Ambulatory Visit: Payer: Self-pay | Admitting: Family Medicine

## 2019-08-02 ENCOUNTER — Other Ambulatory Visit: Payer: Self-pay | Admitting: Family Medicine

## 2019-08-05 ENCOUNTER — Ambulatory Visit (INDEPENDENT_AMBULATORY_CARE_PROVIDER_SITE_OTHER): Payer: Medicare Other | Admitting: Family Medicine

## 2019-08-05 ENCOUNTER — Other Ambulatory Visit: Payer: Self-pay

## 2019-08-05 ENCOUNTER — Encounter: Payer: Self-pay | Admitting: Family Medicine

## 2019-08-05 VITALS — BP 142/60 | HR 67 | Temp 97.6°F | Ht 64.0 in | Wt 162.1 lb

## 2019-08-05 DIAGNOSIS — E119 Type 2 diabetes mellitus without complications: Secondary | ICD-10-CM

## 2019-08-05 DIAGNOSIS — I25118 Atherosclerotic heart disease of native coronary artery with other forms of angina pectoris: Secondary | ICD-10-CM | POA: Diagnosis not present

## 2019-08-05 DIAGNOSIS — R2681 Unsteadiness on feet: Secondary | ICD-10-CM

## 2019-08-05 DIAGNOSIS — D649 Anemia, unspecified: Secondary | ICD-10-CM | POA: Diagnosis not present

## 2019-08-05 DIAGNOSIS — R809 Proteinuria, unspecified: Secondary | ICD-10-CM | POA: Diagnosis not present

## 2019-08-05 DIAGNOSIS — E1129 Type 2 diabetes mellitus with other diabetic kidney complication: Secondary | ICD-10-CM | POA: Diagnosis not present

## 2019-08-05 DIAGNOSIS — S51019A Laceration without foreign body of unspecified elbow, initial encounter: Secondary | ICD-10-CM

## 2019-08-05 LAB — CBC WITH DIFFERENTIAL/PLATELET
Basophils Absolute: 0 10*3/uL (ref 0.0–0.1)
Basophils Relative: 1 % (ref 0.0–3.0)
Eosinophils Absolute: 0 10*3/uL (ref 0.0–0.7)
Eosinophils Relative: 0.8 % (ref 0.0–5.0)
HCT: 38.1 % — ABNORMAL LOW (ref 39.0–52.0)
Hemoglobin: 12.8 g/dL — ABNORMAL LOW (ref 13.0–17.0)
Lymphocytes Relative: 27.8 % (ref 12.0–46.0)
Lymphs Abs: 1.3 10*3/uL (ref 0.7–4.0)
MCHC: 33.6 g/dL (ref 30.0–36.0)
MCV: 90.2 fl (ref 78.0–100.0)
Monocytes Absolute: 1 10*3/uL (ref 0.1–1.0)
Monocytes Relative: 21.4 % — ABNORMAL HIGH (ref 3.0–12.0)
Neutro Abs: 2.3 10*3/uL (ref 1.4–7.7)
Neutrophils Relative %: 49 % (ref 43.0–77.0)
Platelets: 133 10*3/uL — ABNORMAL LOW (ref 150.0–400.0)
RBC: 4.22 Mil/uL (ref 4.22–5.81)
RDW: 15.3 % (ref 11.5–15.5)
WBC: 4.6 10*3/uL (ref 4.0–10.5)

## 2019-08-05 LAB — BASIC METABOLIC PANEL
BUN: 12 mg/dL (ref 6–23)
CO2: 26 mEq/L (ref 19–32)
Calcium: 9 mg/dL (ref 8.4–10.5)
Chloride: 102 mEq/L (ref 96–112)
Creatinine, Ser: 1.13 mg/dL (ref 0.40–1.50)
GFR: 61.48 mL/min (ref >60.00)
Glucose, Bld: 209 mg/dL — ABNORMAL HIGH (ref 70–99)
Potassium: 4.2 mEq/L (ref 3.5–5.1)
Sodium: 137 mEq/L (ref 135–145)

## 2019-08-05 LAB — POCT GLYCOSYLATED HEMOGLOBIN (HGB A1C): Hemoglobin A1C: 8.5 % — AB (ref 4.0–5.6)

## 2019-08-05 LAB — IRON: Iron: 53 ug/dL (ref 42–165)

## 2019-08-05 NOTE — Progress Notes (Signed)
Diabetes:  Using medications without difficulties: yes Hypoglycemic episodes:no Hyperglycemic episodes: no Feet problems: some dry skin, better with OTC balm.   Blood Sugars averaging: usually ~180s in the AMs.   eye exam within last year: yes A1c 8.5, similar to prev.    Walking with a cane at baseline.  Skin tear with a fall when working in the yard.  This was two days ago.  He wrapped it in the meantime.  Fall cautions d/w pt.  He isn't lightheaded.  Discussed leg exercises.    History of anemia noted.  See notes on follow-up labs.  No bleeding other than from skin tear, as expected.  PMH and SH reviewed  Meds, vitals, and allergies reviewed.   ROS: Per HPI unless specifically indicated in ROS section   GEN: nad, alert and oriented HEENT: mucous membranes moist NECK: supple w/o LA CV: rrr. PULM: ctab, no inc wob ABD: soft, +bs EXT: no edema SKIN: no acute rash but skin tear x2 on R arm, 3x1 and 4x3 cm, just distal to the R elbow.  Normal elbow ROM.  Superficial and clean appearing.  Covered with nonstick bandages and Neosporin and wrapped with gauze per routine.

## 2019-08-05 NOTE — Patient Instructions (Addendum)
Keep the skin tears clean and covered.   I don't think it makes sense to drive alone at this point, given the falls.   Please stop using the chainsaw and stay off ladders.   Work on the leg exercises at the table, sitting to standing.   Check with your insurance to see if they will cover the shingrix shot. Recheck A1c at a visit in about 3 months.  Take care.  Glad to see you.  Go to the lab on the way out.  We'll contact you with your lab report.

## 2019-08-08 NOTE — Assessment & Plan Note (Addendum)
A1c similar to previous.  No change in meds at this point.  See after visit summary. >25 minutes spent in face to face time with patient, >50% spent in counselling or coordination of care.

## 2019-08-08 NOTE — Assessment & Plan Note (Addendum)
Advised to avoid high risk activities like working on a ladder, etc.  Fall cautions discussed with patient.  When he did fall in the yard he was not using his cane.  Discussed.  See after visit summary.  Home balance exercises and leg strengthening exercises discussed with patient.

## 2019-08-08 NOTE — Assessment & Plan Note (Signed)
History of anemia noted.  See notes on follow-up labs.

## 2019-08-08 NOTE — Assessment & Plan Note (Signed)
Covered with nonstick bandages and wrapped.  Fall cautions discussed with patient.  They should gradually heal.  He will update me as needed.

## 2019-08-09 ENCOUNTER — Encounter: Payer: Self-pay | Admitting: *Deleted

## 2019-08-15 ENCOUNTER — Emergency Department (HOSPITAL_COMMUNITY): Payer: Medicare Other

## 2019-08-15 ENCOUNTER — Inpatient Hospital Stay (HOSPITAL_COMMUNITY)
Admission: EM | Admit: 2019-08-15 | Discharge: 2019-08-28 | DRG: 871 | Disposition: A | Discharge status: Skilled Nursing Facility | Payer: Medicare Other | Attending: Internal Medicine | Admitting: Internal Medicine

## 2019-08-15 ENCOUNTER — Other Ambulatory Visit: Payer: Self-pay

## 2019-08-15 ENCOUNTER — Encounter (HOSPITAL_COMMUNITY): Payer: Self-pay | Admitting: Emergency Medicine

## 2019-08-15 DIAGNOSIS — Z7401 Bed confinement status: Secondary | ICD-10-CM | POA: Diagnosis not present

## 2019-08-15 DIAGNOSIS — Z7984 Long term (current) use of oral hypoglycemic drugs: Secondary | ICD-10-CM

## 2019-08-15 DIAGNOSIS — Z955 Presence of coronary angioplasty implant and graft: Secondary | ICD-10-CM

## 2019-08-15 DIAGNOSIS — G8929 Other chronic pain: Secondary | ICD-10-CM | POA: Diagnosis present

## 2019-08-15 DIAGNOSIS — R0602 Shortness of breath: Secondary | ICD-10-CM

## 2019-08-15 DIAGNOSIS — E119 Type 2 diabetes mellitus without complications: Secondary | ICD-10-CM

## 2019-08-15 DIAGNOSIS — D649 Anemia, unspecified: Secondary | ICD-10-CM | POA: Diagnosis present

## 2019-08-15 DIAGNOSIS — R519 Headache, unspecified: Secondary | ICD-10-CM | POA: Diagnosis not present

## 2019-08-15 DIAGNOSIS — J1282 Pneumonia due to coronavirus disease 2019: Secondary | ICD-10-CM | POA: Diagnosis present

## 2019-08-15 DIAGNOSIS — E782 Mixed hyperlipidemia: Secondary | ICD-10-CM | POA: Diagnosis not present

## 2019-08-15 DIAGNOSIS — I2510 Atherosclerotic heart disease of native coronary artery without angina pectoris: Secondary | ICD-10-CM | POA: Diagnosis present

## 2019-08-15 DIAGNOSIS — Z87891 Personal history of nicotine dependence: Secondary | ICD-10-CM

## 2019-08-15 DIAGNOSIS — J1289 Other viral pneumonia: Secondary | ICD-10-CM | POA: Diagnosis present

## 2019-08-15 DIAGNOSIS — U071 COVID-19: Secondary | ICD-10-CM | POA: Diagnosis present

## 2019-08-15 DIAGNOSIS — R4182 Altered mental status, unspecified: Secondary | ICD-10-CM | POA: Diagnosis not present

## 2019-08-15 DIAGNOSIS — T148XXA Other injury of unspecified body region, initial encounter: Secondary | ICD-10-CM

## 2019-08-15 DIAGNOSIS — Z823 Family history of stroke: Secondary | ICD-10-CM

## 2019-08-15 DIAGNOSIS — Z66 Do not resuscitate: Secondary | ICD-10-CM | POA: Diagnosis not present

## 2019-08-15 DIAGNOSIS — R748 Abnormal levels of other serum enzymes: Secondary | ICD-10-CM | POA: Diagnosis present

## 2019-08-15 DIAGNOSIS — I10 Essential (primary) hypertension: Secondary | ICD-10-CM | POA: Diagnosis present

## 2019-08-15 DIAGNOSIS — Z7982 Long term (current) use of aspirin: Secondary | ICD-10-CM

## 2019-08-15 DIAGNOSIS — R0902 Hypoxemia: Secondary | ICD-10-CM | POA: Diagnosis not present

## 2019-08-15 DIAGNOSIS — M2550 Pain in unspecified joint: Secondary | ICD-10-CM | POA: Diagnosis not present

## 2019-08-15 DIAGNOSIS — W06XXXA Fall from bed, initial encounter: Secondary | ICD-10-CM

## 2019-08-15 DIAGNOSIS — S0990XA Unspecified injury of head, initial encounter: Secondary | ICD-10-CM | POA: Diagnosis not present

## 2019-08-15 DIAGNOSIS — R1310 Dysphagia, unspecified: Secondary | ICD-10-CM | POA: Diagnosis present

## 2019-08-15 DIAGNOSIS — J9601 Acute respiratory failure with hypoxia: Secondary | ICD-10-CM | POA: Diagnosis present

## 2019-08-15 DIAGNOSIS — R1312 Dysphagia, oropharyngeal phase: Secondary | ICD-10-CM | POA: Diagnosis present

## 2019-08-15 DIAGNOSIS — R410 Disorientation, unspecified: Secondary | ICD-10-CM | POA: Diagnosis not present

## 2019-08-15 DIAGNOSIS — R918 Other nonspecific abnormal finding of lung field: Secondary | ICD-10-CM | POA: Diagnosis not present

## 2019-08-15 DIAGNOSIS — G9341 Metabolic encephalopathy: Secondary | ICD-10-CM | POA: Diagnosis present

## 2019-08-15 DIAGNOSIS — E1165 Type 2 diabetes mellitus with hyperglycemia: Secondary | ICD-10-CM | POA: Diagnosis not present

## 2019-08-15 DIAGNOSIS — N400 Enlarged prostate without lower urinary tract symptoms: Secondary | ICD-10-CM | POA: Diagnosis present

## 2019-08-15 DIAGNOSIS — R7401 Elevation of levels of liver transaminase levels: Secondary | ICD-10-CM | POA: Diagnosis present

## 2019-08-15 DIAGNOSIS — Z209 Contact with and (suspected) exposure to unspecified communicable disease: Secondary | ICD-10-CM | POA: Diagnosis not present

## 2019-08-15 DIAGNOSIS — R638 Other symptoms and signs concerning food and fluid intake: Secondary | ICD-10-CM

## 2019-08-15 DIAGNOSIS — R52 Pain, unspecified: Secondary | ICD-10-CM | POA: Diagnosis not present

## 2019-08-15 DIAGNOSIS — A4189 Other specified sepsis: Secondary | ICD-10-CM | POA: Diagnosis not present

## 2019-08-15 DIAGNOSIS — G9340 Encephalopathy, unspecified: Secondary | ICD-10-CM | POA: Diagnosis not present

## 2019-08-15 DIAGNOSIS — Z8349 Family history of other endocrine, nutritional and metabolic diseases: Secondary | ICD-10-CM

## 2019-08-15 DIAGNOSIS — K219 Gastro-esophageal reflux disease without esophagitis: Secondary | ICD-10-CM | POA: Diagnosis present

## 2019-08-15 DIAGNOSIS — R2689 Other abnormalities of gait and mobility: Secondary | ICD-10-CM | POA: Diagnosis present

## 2019-08-15 DIAGNOSIS — E785 Hyperlipidemia, unspecified: Secondary | ICD-10-CM | POA: Diagnosis present

## 2019-08-15 DIAGNOSIS — I679 Cerebrovascular disease, unspecified: Secondary | ICD-10-CM | POA: Diagnosis present

## 2019-08-15 DIAGNOSIS — D696 Thrombocytopenia, unspecified: Secondary | ICD-10-CM | POA: Diagnosis present

## 2019-08-15 DIAGNOSIS — R404 Transient alteration of awareness: Secondary | ICD-10-CM | POA: Diagnosis not present

## 2019-08-15 DIAGNOSIS — Z781 Physical restraint status: Secondary | ICD-10-CM

## 2019-08-15 DIAGNOSIS — H9190 Unspecified hearing loss, unspecified ear: Secondary | ICD-10-CM | POA: Diagnosis present

## 2019-08-15 DIAGNOSIS — R488 Other symbolic dysfunctions: Secondary | ICD-10-CM | POA: Diagnosis present

## 2019-08-15 DIAGNOSIS — S21119A Laceration without foreign body of unspecified front wall of thorax without penetration into thoracic cavity, initial encounter: Secondary | ICD-10-CM | POA: Diagnosis not present

## 2019-08-15 DIAGNOSIS — Z741 Need for assistance with personal care: Secondary | ICD-10-CM | POA: Diagnosis present

## 2019-08-15 DIAGNOSIS — D638 Anemia in other chronic diseases classified elsewhere: Secondary | ICD-10-CM | POA: Diagnosis present

## 2019-08-15 DIAGNOSIS — M6281 Muscle weakness (generalized): Secondary | ICD-10-CM | POA: Diagnosis present

## 2019-08-15 DIAGNOSIS — S199XXA Unspecified injury of neck, initial encounter: Secondary | ICD-10-CM | POA: Diagnosis not present

## 2019-08-15 DIAGNOSIS — Z7189 Other specified counseling: Secondary | ICD-10-CM | POA: Diagnosis not present

## 2019-08-15 DIAGNOSIS — Z515 Encounter for palliative care: Secondary | ICD-10-CM | POA: Diagnosis not present

## 2019-08-15 DIAGNOSIS — R059 Cough, unspecified: Secondary | ICD-10-CM

## 2019-08-15 DIAGNOSIS — A419 Sepsis, unspecified organism: Secondary | ICD-10-CM | POA: Diagnosis not present

## 2019-08-15 DIAGNOSIS — R05 Cough: Secondary | ICD-10-CM

## 2019-08-15 DIAGNOSIS — Z8249 Family history of ischemic heart disease and other diseases of the circulatory system: Secondary | ICD-10-CM | POA: Diagnosis not present

## 2019-08-15 DIAGNOSIS — E876 Hypokalemia: Secondary | ICD-10-CM | POA: Diagnosis present

## 2019-08-15 DIAGNOSIS — Z79899 Other long term (current) drug therapy: Secondary | ICD-10-CM

## 2019-08-15 DIAGNOSIS — E118 Type 2 diabetes mellitus with unspecified complications: Secondary | ICD-10-CM | POA: Diagnosis present

## 2019-08-15 DIAGNOSIS — I25118 Atherosclerotic heart disease of native coronary artery with other forms of angina pectoris: Secondary | ICD-10-CM | POA: Diagnosis not present

## 2019-08-15 HISTORY — DX: Unspecified hearing loss, unspecified ear: H91.90

## 2019-08-15 LAB — BASIC METABOLIC PANEL
Anion gap: 12 (ref 5–15)
BUN: 17 mg/dL (ref 8–23)
CO2: 23 mmol/L (ref 22–32)
Calcium: 8.7 mg/dL — ABNORMAL LOW (ref 8.9–10.3)
Chloride: 97 mmol/L — ABNORMAL LOW (ref 98–111)
Creatinine, Ser: 1.17 mg/dL (ref 0.61–1.24)
GFR calc Af Amer: >60 mL/min (ref >60)
GFR calc non Af Amer: 56 mL/min — ABNORMAL LOW (ref >60)
Glucose, Bld: 145 mg/dL — ABNORMAL HIGH (ref 70–99)
Potassium: 3.6 mmol/L (ref 3.5–5.1)
Sodium: 132 mmol/L — ABNORMAL LOW (ref 135–145)

## 2019-08-15 LAB — HEPATIC FUNCTION PANEL
ALT: 135 U/L — ABNORMAL HIGH (ref 0–44)
AST: 126 U/L — ABNORMAL HIGH (ref 15–41)
Albumin: 3.1 g/dL — ABNORMAL LOW (ref 3.5–5.0)
Alkaline Phosphatase: 55 U/L (ref 38–126)
Bilirubin, Direct: 0.4 mg/dL — ABNORMAL HIGH (ref 0.0–0.2)
Indirect Bilirubin: 1.1 mg/dL — ABNORMAL HIGH (ref 0.3–0.9)
Total Bilirubin: 1.5 mg/dL — ABNORMAL HIGH (ref 0.3–1.2)
Total Protein: 6.7 g/dL (ref 6.5–8.1)

## 2019-08-15 LAB — URINALYSIS, ROUTINE W REFLEX MICROSCOPIC
Bilirubin Urine: NEGATIVE
Glucose, UA: 50 mg/dL — AB
Ketones, ur: 5 mg/dL — AB
Leukocytes,Ua: NEGATIVE
Nitrite: NEGATIVE
Protein, ur: 100 mg/dL — AB
Specific Gravity, Urine: 1.024 (ref 1.005–1.030)
pH: 5 (ref 5.0–8.0)

## 2019-08-15 LAB — CBC
HCT: 34.6 % — ABNORMAL LOW (ref 39.0–52.0)
HCT: 36 % — ABNORMAL LOW (ref 39.0–52.0)
Hemoglobin: 11.8 g/dL — ABNORMAL LOW (ref 13.0–17.0)
Hemoglobin: 12.2 g/dL — ABNORMAL LOW (ref 13.0–17.0)
MCH: 30.1 pg (ref 26.0–34.0)
MCH: 29.5 pg (ref 26.0–34.0)
MCHC: 34.1 g/dL (ref 30.0–36.0)
MCHC: 33.9 g/dL (ref 30.0–36.0)
MCV: 88.3 fL (ref 80.0–100.0)
MCV: 87.2 fL (ref 80.0–100.0)
Platelets: 147 10*3/uL — ABNORMAL LOW (ref 150–400)
Platelets: 160 10*3/uL (ref 150–400)
RBC: 3.92 MIL/uL — ABNORMAL LOW (ref 4.22–5.81)
RBC: 4.13 MIL/uL — ABNORMAL LOW (ref 4.22–5.81)
RDW: 14.7 % (ref 11.5–15.5)
RDW: 14.6 % (ref 11.5–15.5)
WBC: 4.9 10*3/uL (ref 4.0–10.5)
WBC: 5.7 10*3/uL (ref 4.0–10.5)
nRBC: 0 % (ref 0.0–0.2)
nRBC: 0 % (ref 0.0–0.2)

## 2019-08-15 LAB — ETHANOL: Alcohol, Ethyl (B): <10 mg/dL (ref <10)

## 2019-08-15 LAB — TROPONIN I (HIGH SENSITIVITY)
Troponin I (High Sensitivity): 18 ng/L — ABNORMAL HIGH (ref <18)
Troponin I (High Sensitivity): 17 ng/L (ref <18)

## 2019-08-15 LAB — RAPID URINE DRUG SCREEN, HOSP PERFORMED
Amphetamines: NOT DETECTED
Barbiturates: NOT DETECTED
Benzodiazepines: NOT DETECTED
Cocaine: NOT DETECTED
Opiates: NOT DETECTED
Tetrahydrocannabinol: NOT DETECTED

## 2019-08-15 LAB — LACTIC ACID, PLASMA: Lactic Acid, Venous: 1.5 mmol/L (ref 0.5–1.9)

## 2019-08-15 LAB — CREATININE, SERUM
Creatinine, Ser: 1.06 mg/dL (ref 0.61–1.24)
GFR calc Af Amer: >60 mL/min (ref >60)
GFR calc non Af Amer: >60 mL/min (ref >60)

## 2019-08-15 LAB — BRAIN NATRIURETIC PEPTIDE: B Natriuretic Peptide: 83.1 pg/mL (ref 0.0–100.0)

## 2019-08-15 LAB — D-DIMER, QUANTITATIVE: D-Dimer, Quant: 3.37 ug/mL-FEU — ABNORMAL HIGH (ref 0.00–0.50)

## 2019-08-15 LAB — POC SARS CORONAVIRUS 2 AG -  ED: SARS Coronavirus 2 Ag: POSITIVE — AB

## 2019-08-15 LAB — C-REACTIVE PROTEIN: CRP: 19.8 mg/dL — ABNORMAL HIGH (ref <1.0)

## 2019-08-15 LAB — LACTATE DEHYDROGENASE: LDH: 378 U/L — ABNORMAL HIGH (ref 98–192)

## 2019-08-15 LAB — PROCALCITONIN: Procalcitonin: <0.1 ng/mL

## 2019-08-15 LAB — AMMONIA: Ammonia: 11 umol/L (ref 9–35)

## 2019-08-15 LAB — FERRITIN: Ferritin: 468 ng/mL — ABNORMAL HIGH (ref 24–336)

## 2019-08-15 LAB — FIBRINOGEN: Fibrinogen: 685 mg/dL — ABNORMAL HIGH (ref 210–475)

## 2019-08-15 LAB — TSH: TSH: 0.966 u[IU]/mL (ref 0.350–4.500)

## 2019-08-15 LAB — HEPATITIS B SURFACE ANTIGEN: Hepatitis B Surface Ag: NONREACTIVE

## 2019-08-15 LAB — CBG MONITORING, ED: Glucose-Capillary: 113 mg/dL — ABNORMAL HIGH (ref 70–99)

## 2019-08-15 MED ORDER — METHYLPREDNISOLONE SODIUM SUCC 40 MG IJ SOLR
0.5000 mg/kg | Freq: Two times a day (BID) | INTRAMUSCULAR | Status: DC
  Administered 2019-08-15 – 2019-08-16 (×2): 36.8 mg via INTRAVENOUS
  Filled 2019-08-15 (×2): qty 1

## 2019-08-15 MED ORDER — SODIUM CHLORIDE 0.9% FLUSH
3.0000 mL | Freq: Once | INTRAVENOUS | Status: AC
  Administered 2019-08-15: 3 mL via INTRAVENOUS

## 2019-08-15 MED ORDER — INSULIN ASPART 100 UNIT/ML ~~LOC~~ SOLN: 0.0000 [IU] | Freq: Every day | SUBCUTANEOUS | Status: DC

## 2019-08-15 MED ORDER — ONDANSETRON HCL 4 MG/2ML IJ SOLN: 4.0000 mg | Freq: Four times a day (QID) | INTRAMUSCULAR | Status: DC | PRN

## 2019-08-15 MED ORDER — VITAMIN C 500 MG PO TABS
500.0000 mg | ORAL_TABLET | Freq: Every day | ORAL | Status: DC
  Administered 2019-08-18 – 2019-08-28 (×10): 500 mg via ORAL
  Filled 2019-08-15 (×12): qty 1

## 2019-08-15 MED ORDER — SODIUM CHLORIDE 0.9 % IV SOLN
100.0000 mg | INTRAVENOUS | Status: AC
  Administered 2019-08-16 – 2019-08-19 (×4): 100 mg via INTRAVENOUS
  Filled 2019-08-15: qty 100
  Filled 2019-08-15 (×3): qty 20
  Filled 2019-08-15: qty 100

## 2019-08-15 MED ORDER — LORAZEPAM 2 MG/ML IJ SOLN
1.0000 mg | Freq: Once | INTRAMUSCULAR | Status: AC
  Administered 2019-08-15: 1 mg via INTRAVENOUS
  Filled 2019-08-15: qty 1

## 2019-08-15 MED ORDER — SODIUM CHLORIDE 0.9 % IV SOLN
200.0000 mg | Freq: Once | INTRAVENOUS | Status: AC
  Administered 2019-08-15: 200 mg via INTRAVENOUS
  Filled 2019-08-15: qty 40

## 2019-08-15 MED ORDER — ONDANSETRON HCL 4 MG PO TABS: 4.0000 mg | ORAL_TABLET | Freq: Four times a day (QID) | ORAL | Status: DC | PRN

## 2019-08-15 MED ORDER — SODIUM CHLORIDE 0.9 % IV SOLN
INTRAVENOUS | Status: DC
  Administered 2019-08-15 – 2019-08-16 (×3): via INTRAVENOUS

## 2019-08-15 MED ORDER — INSULIN ASPART 100 UNIT/ML ~~LOC~~ SOLN
0.0000 [IU] | Freq: Three times a day (TID) | SUBCUTANEOUS | Status: DC
  Administered 2019-08-16: 08:00:00 5 [IU] via SUBCUTANEOUS

## 2019-08-15 MED ORDER — ACETAMINOPHEN 650 MG RE SUPP
650.0000 mg | Freq: Once | RECTAL | Status: AC
  Administered 2019-08-15: 19:00:00 650 mg via RECTAL

## 2019-08-15 MED ORDER — ENOXAPARIN SODIUM 40 MG/0.4ML ~~LOC~~ SOLN
40.0000 mg | SUBCUTANEOUS | Status: DC
  Administered 2019-08-15 – 2019-08-27 (×13): 40 mg via SUBCUTANEOUS
  Filled 2019-08-15 (×13): qty 0.4

## 2019-08-15 MED ORDER — ZINC SULFATE 220 (50 ZN) MG PO CAPS
220.0000 mg | ORAL_CAPSULE | Freq: Every day | ORAL | Status: DC
  Administered 2019-08-18 – 2019-08-28 (×10): 220 mg via ORAL
  Filled 2019-08-15 (×14): qty 1

## 2019-08-15 MED ORDER — ACETAMINOPHEN 325 MG PO TABS
650.0000 mg | ORAL_TABLET | Freq: Four times a day (QID) | ORAL | Status: DC | PRN
  Administered 2019-08-26 – 2019-08-27 (×2): 650 mg via ORAL
  Filled 2019-08-15 (×2): qty 2

## 2019-08-15 MED ORDER — LABETALOL HCL 5 MG/ML IV SOLN
10.0000 mg | Freq: Four times a day (QID) | INTRAVENOUS | Status: DC | PRN
  Administered 2019-08-17: 10 mg via INTRAVENOUS
  Filled 2019-08-15: qty 4

## 2019-08-15 NOTE — ED Notes (Signed)
RN called main lab. Lab to run urine drug screen from previous UA collected via In and Out cath. Sending requisition at this time

## 2019-08-15 NOTE — ED Notes (Signed)
Admitting at bedside 

## 2019-08-15 NOTE — ED Triage Notes (Signed)
Pt tested for COVID on Thursday and doesn't have results.  Son COVID + and has visited recently.  Family reports AMS and increased hearing loss since Thursday.  PT fell out of bed this morning and c/o pain to center of chest and skin tear to chest.  EMS administered ASA and 1 SL NTG.  CBG 161.

## 2019-08-15 NOTE — ED Provider Notes (Signed)
Patient signed out to continue to monitor.  Patient spiked a fever, with tachycardia, confusion and tachypnea likely related to Covid which is positive.  Patient be admitted to the hospital.  Discussed with hospitalist for admission to [facility] unit.  Recheck patient who has tachypnea however fortunately not requiring significant oxygen at this time.  CT scans ordered due to fall and head neck injury no acute findings reviewed.  CRITICAL CARE Performed by: Mariea Clonts   Total critical care time: 35 minutes  Critical care time was exclusive of separately billable procedures and treating other patients.  Critical care was necessary to treat or prevent imminent or life-threatening deterioration.  Critical care was time spent personally by me on the following activities: development of treatment plan with patient and/or surrogate as well as nursing, discussions with consultants, evaluation of patient's response to treatment, examination of patient, obtaining history from patient or surrogate, ordering and performing treatments and interventions, ordering and review of laboratory studies, ordering and review of radiographic studies, pulse oximetry and re-evaluation of patient's condition.  COVID pneumonia   Elnora Morrison, MD 08/15/19 2036

## 2019-08-15 NOTE — ED Notes (Signed)
PO meds not given at this time d/t pt's AMS and unable to follow commands. Concern for aspiration.

## 2019-08-15 NOTE — ED Notes (Signed)
Paul Terrell daughter of pt would like undate when pt is in room.  Cell: [phone]

## 2019-08-15 NOTE — ED Notes (Signed)
POC CoV-2 test result Positive, called to Lita Mains RN.

## 2019-08-15 NOTE — ED Provider Notes (Signed)
[facility] EMERGENCY DEPARTMENT Provider Note   CSN: [id number] Arrival date & time: 08/15/19  0945     History   Chief Complaint Chief Complaint  Patient presents with  . Chest Pain  . Fall  . Altered Mental Status    HPI Paul Terrell is a 85 y.o. male.     The history is provided by the patient and medical records. The history is limited by the condition of the patient. No language interpreter was used.  Fall This is a new problem. The current episode started 6 to 12 hours ago. The problem occurs rarely. The problem has not changed since onset.Associated symptoms include chest pain and shortness of breath. Pertinent negatives include no abdominal pain and no headaches. Nothing aggravates the symptoms. Nothing relieves the symptoms. He has tried nothing for the symptoms. The treatment provided no relief.    Past Medical History:  Diagnosis Date  . Arthritis   . Basal cell carcinoma of scalp 12/16/07   Reexcision (Dr. Merril Abbe. Teressa Senter)  . BPH (benign prostatic hyperplasia)   . CAD (coronary artery disease) 11/01   stent placed  . Cataract   . Diabetes mellitus type II 11/01  . GERD (gastroesophageal reflux disease)    occasionally  . Hearing aid worn   . Hyperlipemia 11/01  . Hypertension 11/01  . Ruptured disc, cervical    Neck C7 (Dr. Pearlie Oyster)    Patient Active Problem List   Diagnosis Date Noted  . Abnormal gait 11/22/2018  . Skin tear of elbow without complication [id number]  . Anemia 05/14/2018  . Unsteadiness on feet 11/05/2017  . Spinal stenosis of lumbar region 11/08/2015  . Advance care planning 11/04/2014  . Leg pain 01/30/2014  . Medicare annual wellness visit, subsequent 11/01/2013  . BPH (benign prostatic hyperplasia)   . Diabetes mellitus with microalbuminuria ([place])   . Hyperlipidemia   . Hypertensive heart disease   . CAD (coronary artery disease)     Past Surgical History:  Procedure Laterality Date  . BASAL CELL  CARCINOMA EXCISION    . BRONCHIAL BRUSHINGS  05/16/2018   Procedure: ESOPHAGEAL BRUSHINGS;  Surgeon: Irving Copas., MD;  Location: [facility];  Service: Gastroenterology;;  . Lillard Anes  02/2000   Dr. Janus Molder - Right  . CERVICAL FUSION    . COLONOSCOPY     2012  . COLONOSCOPY N/A 05/17/2018   Procedure: COLONOSCOPY;  Surgeon: Mansouraty, Telford Nab., MD;  Location: [facility];  Service: Gastroenterology;  Laterality: N/A;  . ESOPHAGOGASTRODUODENOSCOPY (EGD) WITH PROPOFOL N/A 05/16/2018   Procedure: ESOPHAGOGASTRODUODENOSCOPY (EGD) WITH PROPOFOL ;  Surgeon: Rush Landmark Telford Nab., MD;  Location: [facility];  Service: Gastroenterology;  Laterality: N/A;  . HOT HEMOSTASIS N/A 05/16/2018   Procedure: HOT HEMOSTASIS (ARGON PLASMA COAGULATION/BICAP);  Surgeon: Irving Copas., MD;  Location: [facility];  Service: Gastroenterology;  Laterality: N/A;  . KNEE ARTHROSCOPY  09/1999   Right  . LACERATION REPAIR  09/1999   Right Hand (Dr. Fredna Dow)  . LITHOTRIPSY  1991   Dr. Elyse Jarvis  . POLYPECTOMY  05/17/2018   Procedure: POLYPECTOMY;  Surgeon: Mansouraty, Telford Nab., MD;  Location: [facility];  Service: Gastroenterology;;  . Cherre Robins CUFF REPAIR  02/05   Left        Home Medications    Prior to Admission medications   Medication Sig Start Date End Date Taking? Authorizing Provider  acetaminophen (TYLENOL) 500 MG tablet Take 500 mg by mouth every 6 (six) hours as needed (for  pain or headaches).    [provider]  aspirin 81 MG tablet Take 81 mg by mouth at bedtime.     [provider]  Cholecalciferol (VITAMIN D-3) 5000 UNITS TABS Take 5,000 Units by mouth daily.     [provider]  ferrous sulfate 325 (65 FE) MG tablet Take 1 tablet (325 mg total) by mouth daily with breakfast. 11/19/18   Tonia Ghent, MD  finasteride (PROSCAR) 5 MG tablet TAKE 1 TABLET (5 MG TOTAL) BY MOUTH DAILY. 02/18/19   Tonia Ghent, MD  glucose blood (ONE  TOUCH ULTRA TEST) test strip TEST BLOOD SUGAR ONCE DAILY. DX: 250.00 05/22/18   Tonia Ghent, MD  glyBURIDE (DIABETA) 5 MG tablet TAKE 2 TABLETS (10 MG TOTAL) DAILY WITH BREAKFAST. 06/17/19   Tonia Ghent, MD  Lancets [facility] ULTRASOFT) lancets Test once daily Dx 250.00     [provider]  lisinopril (ZESTRIL) 2.5 MG tablet TAKE 1 TABLET EVERY DAY 08/03/19   Tonia Ghent, MD  metFORMIN (GLUCOPHAGE) 1000 MG tablet TAKE 1 TABLET TWICE DAILY 07/30/19   Tonia Ghent, MD  metoprolol succinate (TOPROL-XL) 50 MG 24 hr tablet TAKE 1 TABLET EVERY DAY 06/17/19   Tonia Ghent, MD  Multiple Vitamin (MULTIVITAMIN) tablet Take 1 tablet by mouth daily.      [provider]  pantoprazole (PROTONIX) 40 MG tablet Take 1 tablet (40 mg total) by mouth daily. 11/30/18   Tonia Ghent, MD  pravastatin (PRAVACHOL) 20 MG tablet Take 1 tablet (20 mg total) by mouth at bedtime. 11/30/18   Tonia Ghent, MD  tamsulosin (FLOMAX) 0.4 MG CAPS capsule Take 1 capsule (0.4 mg total) by mouth at bedtime. 11/30/18   Tonia Ghent, MD  vitamin B-12 (CYANOCOBALAMIN) 250 MCG tablet Take 250 mcg by mouth 2 (two) times daily.     [provider]    Family History Family History  Problem Relation Age of Onset  . Stroke Mother        Lived 2 years  . Heart disease Mother        CAD, Angioplasty X 2  . Hypertension Mother   . Heart disease Father        MI  . Hypertension Father   . Hypertension Sister   . Hypertension Brother   . Heart disease Brother        MI  . Hyperlipidemia Brother   . Heart disease Brother        MI    Social History Social History   Tobacco Use  . Smoking status: Former Smoker    Packs/day: 1.00    Years: 25.00    Pack years: 25.00    Types: Cigarettes    Quit date: 01/09/1976    Years since quitting: 43.6  . Smokeless tobacco: Current User    Types: Chew  . Tobacco comment: quit smoking about 25 years ago  Substance Use Topics  . Alcohol  use: No  . Drug use: No     Allergies   Nsaids, Citrus, and Zocor [simvastatin]   Review of Systems Review of Systems  Unable to perform ROS: Mental status change  Constitutional: Negative for chills, diaphoresis, fatigue and fever.  HENT: Negative for congestion.   Respiratory: Positive for cough and shortness of breath. Negative for chest tightness and wheezing.   Cardiovascular: Positive for chest pain. Negative for palpitations and leg swelling.  Gastrointestinal: Negative for abdominal pain, diarrhea, nausea  and vomiting.  Genitourinary: Negative for flank pain.  Musculoskeletal: Negative for back pain, neck pain and neck stiffness.  Skin: Positive for wound (skin tear). Negative for rash.  Neurological: Negative for light-headedness and headaches.  All other systems reviewed and are negative.    Physical Exam Updated Vital Signs BP 124/71 (BP Location: Right Arm)   Pulse 91   Temp 99.3 F (37.4 C) (Oral)   Resp 16   SpO2 92%   Physical Exam Vitals signs and nursing note reviewed.  Constitutional:      General: He is not in acute distress.    Appearance: He is well-developed. He is not ill-appearing, toxic-appearing or diaphoretic.  HENT:     Head: Normocephalic and atraumatic.     Nose: No congestion or rhinorrhea.     Mouth/Throat:     Mouth: Mucous membranes are moist.     Pharynx: No oropharyngeal exudate or posterior oropharyngeal erythema.  Eyes:     Extraocular Movements: Extraocular movements intact.     Conjunctiva/sclera: Conjunctivae normal.     Pupils: Pupils are equal, round, and reactive to light.  Neck:     Musculoskeletal: Neck supple.  Cardiovascular:     Rate and Rhythm: Normal rate and regular rhythm.     Heart sounds: No murmur.  Pulmonary:     Effort: Pulmonary effort is normal. No respiratory distress.     Breath sounds: Rhonchi present. No wheezing or rales.  Chest:     Chest wall: Tenderness present.    Abdominal:      Palpations: Abdomen is soft.     Tenderness: There is no abdominal tenderness. There is no right CVA tenderness or left CVA tenderness.  Musculoskeletal:        General: No tenderness.  Skin:    General: Skin is warm and dry.  Neurological:     General: No focal deficit present.     Mental Status: He is alert.     Sensory: No sensory deficit.     Motor: No weakness.  Psychiatric:        Mood and Affect: Mood normal.      ED Treatments / Results  Labs (all labs ordered are listed, but only abnormal results are displayed) Labs Reviewed  BASIC METABOLIC PANEL - Abnormal; Notable for the following components:      Result Value   Sodium 132 (*)    Chloride 97 (*)    Glucose, Bld 145 (*)    Calcium 8.7 (*)    GFR calc non Af Amer 56 (*)    All other components within normal limits  CBC - Abnormal; Notable for the following components:   RBC 3.92 (*)    Hemoglobin 11.8 (*)    HCT 34.6 (*)    Platelets 147 (*)    All other components within normal limits  URINALYSIS, ROUTINE W REFLEX MICROSCOPIC - Abnormal; Notable for the following components:   Color, Urine AMBER (*)    APPearance CLOUDY (*)    Glucose, UA 50 (*)    Hgb urine dipstick SMALL (*)    Ketones, ur 5 (*)    Protein, ur 100 (*)    Bacteria, UA RARE (*)    All other components within normal limits  HEPATIC FUNCTION PANEL - Abnormal; Notable for the following components:   Albumin 3.1 (*)    AST 126 (*)    ALT 135 (*)    Total Bilirubin 1.5 (*)    Bilirubin, Direct  0.4 (*)    Indirect Bilirubin 1.1 (*)    All other components within normal limits  POC SARS CORONAVIRUS 2 AG -  ED - Abnormal; Notable for the following components:   SARS Coronavirus 2 Ag POSITIVE (*)    All other components within normal limits  TROPONIN I (HIGH SENSITIVITY) - Abnormal; Notable for the following components:   Troponin I (High Sensitivity) 18 (*)    All other components within normal limits  URINE CULTURE  CULTURE, BLOOD  (ROUTINE X 2)  CULTURE, BLOOD (ROUTINE X 2)  TROPONIN I (HIGH SENSITIVITY)    EKG EKG Interpretation  Date/Time:  Sunday August 15 2019 09:48:36 EST Ventricular Rate:  100 PR Interval:  112 QRS Duration: 102 QT Interval:  380 QTC Calculation: 490 R Axis:   23 Text Interpretation: Normal sinus rhythm Possible Left atrial enlargement ST & Marked T wave abnormality, consider anterolateral ischemia Prolonged QT Abnormal ECG When compared to prior, new t wave inversions in leads 2,3,AVF, V1-V5. No STEMI Confirmed by Antony Blackbird [phone]) on 08/15/2019 11:05:48 AM   Radiology Dg Chest 2 View  Result Date: 08/15/2019 CLINICAL DATA:  Altered mental status. EXAM: CHEST - 2 VIEW COMPARISON:  03/10/2009 FINDINGS: The cardio pericardial silhouette is enlarged. Low lung volumes. Patchy peripheral airspace opacity noted in both lung bases without pleural effusion. The visualized bony structures of the thorax are intact. Degenerative changes noted right shoulder. IMPRESSION: Patchy peripheral airspace opacity in both lung bases. Features suggest multifocal pneumonia and atypical/viral etiology should be considered. Electronically Signed   By: Misty Stanley M.D.   On: 08/15/2019 11:00    Procedures Procedures (including critical care time)  Medications Ordered in ED Medications  sodium chloride flush (NS) 0.9 % injection 3 mL (3 mLs Intravenous Given 08/15/19 1132)     Initial Impression / Assessment and Plan / ED Course  I have reviewed the triage vital signs and the nursing notes.  Pertinent labs & imaging results that were available during my care of the patient were reviewed by me and considered in my medical decision making (see chart for details).        Paul Terrell is a 85 y.o. male with a past medical history significant for hypertension, hyperlipidemia, diabetes, CAD, BPH, and prior anemia who presents with confusion, cough, fatigue, and a fall.  I spoke to patient's daughter  reports that patient had a Covid test he took several days ago returned positive this morning.  Patient had a exposure to a Covid positive family member last week.  Patient has been acting more confused the last few days according to family.  They report he is very hard of hearing but normally he is very sharp and with it.  Daughter also reports that this morning she was told he fell out of the bed hitting his chest on a nightstand.  Patient was brought in for evaluation.  With a very loud voice, patient will answer questions.  He reports that he has had some cough and is feeling confused.  He is denying any pain at this time but he does not member the fall.  He denies nausea vomiting, urinary symptoms or GI symptoms.  He denies any vision changes or neurologic complaints.  On exam, patient is able to move all extremities and has normal sensation throughout.  Pupils are symmetric and reactive.  Symmetric smile.  Neck was nontender and chest had a small skin tear in the central chest.  No significant  tenderness on exam.  No murmur.  Lungs had slight rhonchi.  Chest x-ray was performed showing no rib fractures but did show evidence of multifocal pneumonia likely atypical and viral in nature.  Given his Covid diagnosis, this was felt to be Covid over bacterial infection initially.  Laboratory testing showed no leukocytosis.  Patient has mild anemia.  Creatinine is not elevated.  We will get head and neck CT look for traumatic injury however if imaging is reassuring, anticipate admission for Covid pneumonia causing altered mental status with confusion.  4:57 PM Still awaiting CT scans of the head and neck.  Patient will require admission for altered mental status and COVID-19 pulmonary infection after imaging is completed.  Care transferred to Dr. Reather Converse while awaiting for results of imaging.  Final Clinical Impressions(s) / ED Diagnoses   Final diagnoses:  Fall from bed, initial encounter  COVID-19   Cough  Abrasion  Confusion    Clinical Impression: 1. Fall from bed, initial encounter   2. COVID-19   3. Cough   4. Abrasion   5. Confusion     Disposition: Admit  This note was prepared with assistance of Dragon voice recognition software. Occasional wrong-word or sound-a-like substitutions may have occurred due to the inherent limitations of voice recognition software.       , Gwenyth Allegra, MD 08/15/19 [phone]

## 2019-08-15 NOTE — H&P (Addendum)
History and Physical    Paul Terrell [id number] DOB: 22-Jan-1931 DOA: 08/15/2019  PCP: Tonia Ghent, MD  Patient coming from: home I have personally briefly reviewed patient's old medical records in [place]  Chief Complaint: AMS  HPI: Paul Terrell is a 85 y.o. male with medical history significant of hypertension, hyperlipidemia, diabetes, coronary artery disease status post stents, BPH, GERD presents to emergency department due to increased confusion since couple of days.  History is somewhat limited due to patient's condition.  He is very hard of hearing as well.  I called patient's wife Ms. Everlene Farrier on this [phone].  Patient's wife reports that patient was not doing well since couple of days, has trouble walking, not eating/drinking well, has dry cough, generalized weakness and lethargy.  he tested positive for COVID-19 yesterday.  Wife reports the patient fell from the bed this morning due to weakness.  No seizure, loss of consciousness, urinary/bowel incontinence.  No fever chills, nausea, vomiting, diarrhea, headache, blurry vision, chest pain, shortness of breath, palpitation, leg swelling.  Patient lives with his wife at home, uses cane for ambulation, independent on daily life activities, no history of smoking, alcohol, illicit drug use.  ED Course: Upon arrival to ED: Patient's blood pressure was elevated, tachycardic, tachypneic, fever of 102.5, troponin: 18-->17, repeat Covid 19+, chest x-ray came back positive for multifocal pneumonia.  CT head and cervical spine came back negative for acute findings.  Review of Systems: As per HPI otherwise negative.    Past Medical History:  Diagnosis Date   Arthritis    Basal cell carcinoma of scalp 12/16/07   Reexcision (Dr. Merril Abbe. Teressa Senter)   BPH (benign prostatic hyperplasia)    CAD (coronary artery disease) 11/01   stent placed   Cataract    Diabetes mellitus type II 11/01   GERD (gastroesophageal  reflux disease)    occasionally   Hearing aid worn    HOH (hard of hearing)    Hyperlipemia 11/01   Hypertension 11/01   Ruptured disc, cervical    Neck C7 (Dr. Pearlie Oyster)    Past Surgical History:  Procedure Laterality Date   BASAL CELL CARCINOMA EXCISION     BRONCHIAL BRUSHINGS  05/16/2018   Procedure: ESOPHAGEAL BRUSHINGS;  Surgeon: Irving Copas., MD;  Location: [facility];  Service: Gastroenterology;;   Lillard Anes  02/2000   Dr. Janus Molder - Right   CERVICAL FUSION     COLONOSCOPY     2012   COLONOSCOPY N/A 05/17/2018   Procedure: COLONOSCOPY;  Surgeon: Irving Copas., MD;  Location: [facility];  Service: Gastroenterology;  Laterality: N/A;   ESOPHAGOGASTRODUODENOSCOPY (EGD) WITH PROPOFOL N/A 05/16/2018   Procedure: ESOPHAGOGASTRODUODENOSCOPY (EGD) WITH PROPOFOL ;  Surgeon: Rush Landmark Telford Nab., MD;  Location: [facility] [place];  Service: Gastroenterology;  Laterality: N/A;   HOT HEMOSTASIS N/A 05/16/2018   Procedure: HOT HEMOSTASIS (ARGON PLASMA COAGULATION/BICAP);  Surgeon: Irving Copas., MD;  Location: [facility];  Service: Gastroenterology;  Laterality: N/A;   KNEE ARTHROSCOPY  09/1999   Right   LACERATION REPAIR  09/1999   Right Hand (Dr. Fredna Dow)   LITHOTRIPSY  1991   Dr. Elyse Jarvis   POLYPECTOMY  05/17/2018   Procedure: POLYPECTOMY;  Surgeon: Rush Landmark Telford Nab., MD;  Location: [facility];  Service: Gastroenterology;;   ROTATOR CUFF REPAIR  02/05   Left     reports that he quit smoking about 43 years ago. His smoking use included cigarettes. He has a 25.00 pack-year smoking history. His  smokeless tobacco use includes chew. He reports that he does not drink alcohol or use drugs.  Allergies  Allergen Reactions   Nsaids Other (See Comments)    H/o anemia   Citrus Hives   Zocor [Simvastatin] Other (See Comments)    Myalgia     Family History  Problem Relation Age of Onset   Stroke Mother        Lived 2  years   Heart disease Mother        CAD, Angioplasty X 2   Hypertension Mother    Heart disease Father        MI   Hypertension Father    Hypertension Sister    Hypertension Brother    Heart disease Brother        MI   Hyperlipidemia Brother    Heart disease Brother        MI    Prior to Admission medications   Medication Sig Start Date End Date Taking? Authorizing Provider  acetaminophen (TYLENOL) 500 MG tablet Take 500 mg by mouth every 6 (six) hours as needed (for pain or headaches).   Yes [provider]  aspirin 81 MG tablet Take 81 mg by mouth at bedtime.    Yes [provider]  Cholecalciferol (VITAMIN D-3) 5000 UNITS TABS Take 5,000 Units by mouth daily.    Yes [provider]  ferrous sulfate 325 (65 FE) MG tablet Take 1 tablet (325 mg total) by mouth daily with breakfast. 11/19/18  Yes Tonia Ghent, MD  finasteride (PROSCAR) 5 MG tablet TAKE 1 TABLET (5 MG TOTAL) BY MOUTH DAILY. Patient taking differently: Take 5 mg by mouth daily.  02/18/19  Yes Tonia Ghent, MD  glucose blood (ONE TOUCH ULTRA TEST) test strip TEST BLOOD SUGAR ONCE DAILY. DX: 250.00 05/22/18  Yes Tonia Ghent, MD  glyBURIDE (DIABETA) 5 MG tablet TAKE 2 TABLETS (10 MG TOTAL) DAILY WITH BREAKFAST. Patient taking differently: Take 10 mg by mouth daily with breakfast.  06/17/19  Yes Tonia Ghent, MD  Lancets [facility] ULTRASOFT) lancets Test once daily Dx 250.00    Yes [provider]  lisinopril (ZESTRIL) 2.5 MG tablet TAKE 1 TABLET EVERY DAY Patient taking differently: Take 2.5 mg by mouth daily.  08/03/19  Yes Tonia Ghent, MD  metFORMIN (GLUCOPHAGE) 1000 MG tablet TAKE 1 TABLET TWICE DAILY Patient taking differently: Take 500 mg by mouth 2 (two) times daily with a meal.  07/30/19  Yes Tonia Ghent, MD  metoprolol succinate (TOPROL-XL) 50 MG 24 hr tablet TAKE 1 TABLET EVERY DAY Patient taking differently: Take 50 mg by mouth daily.  06/17/19   Yes Tonia Ghent, MD  Multiple Vitamin (MULTIVITAMIN) tablet Take 1 tablet by mouth daily.     Yes [provider]  pantoprazole (PROTONIX) 40 MG tablet Take 1 tablet (40 mg total) by mouth daily. 11/30/18  Yes Tonia Ghent, MD  pravastatin (PRAVACHOL) 20 MG tablet Take 1 tablet (20 mg total) by mouth at bedtime. 11/30/18  Yes Tonia Ghent, MD  tamsulosin (FLOMAX) 0.4 MG CAPS capsule Take 1 capsule (0.4 mg total) by mouth at bedtime. 11/30/18  Yes Tonia Ghent, MD  vitamin B-12 (CYANOCOBALAMIN) 250 MCG tablet Take 250 mcg by mouth 2 (two) times daily.    Yes [provider]    Physical Exam: Vitals:   08/15/19 1845 08/15/19 1849 08/15/19 1900 08/15/19 1930  BP: (!) 165/85  (!) 153/84 (!) 150/81  Pulse:  (!) 118 (!) 108 (!) 108  Resp:  (!) 23 (!) 24 (!) 36  Temp:      TempSrc:      SpO2: 100% 98% 93% 93%    Constitutional: NAD, calm, comfortable, on room air, alert and awake, not following commands, moving all extremities well. Eyes: PERRL, lids and conjunctivae normal ENMT: Mucous membranes are moist. Posterior pharynx clear of any exudate or lesions.Normal dentition.  Neck: normal, supple, no masses, no thyromegaly Respiratory: clear to auscultation bilaterally, no wheezing, no crackles. Normal respiratory effort. No accessory muscle use.  Cardiovascular: Regular rate and rhythm, no murmurs / rubs / gallops. No extremity edema. 2+ pedal pulses. No carotid bruits.  Abdomen: no tenderness, no masses palpated. No hepatosplenomegaly. Bowel sounds positive.  Musculoskeletal: no clubbing / cyanosis. No joint deformity upper and lower extremities. Good ROM, no contractures. Normal muscle tone.  Skin: no rashes, lesions, ulcers. No induration Neurologic: Alert and awake, moving all extremities well, unable to perform neurological exam due to patient's condition. Labs on Admission: I have personally reviewed following labs and imaging studies  CBC: Recent Labs    Lab 09-07-2019 1009  WBC 4.9  HGB 11.8*  HCT 34.6*  MCV 88.3  PLT [id number]*   Basic Metabolic Panel: Recent Labs  Lab 09-07-2019 1009  NA 132*  K 3.6  CL 97*  CO2 23  GLUCOSE 145*  BUN 17  CREATININE 1.17  CALCIUM 8.7*   GFR: Estimated Creatinine Clearance: 41.6 mL/min (by C-G formula based on SCr of 1.17 mg/dL). Liver Function Tests: Recent Labs  Lab 09/07/2019 1613  AST 126*  ALT 135*  ALKPHOS 55  BILITOT 1.5*  PROT 6.7  ALBUMIN 3.1*   No results for input(s): LIPASE, AMYLASE in the last 168 hours. No results for input(s): AMMONIA in the last 168 hours. Coagulation Profile: No results for input(s): INR, PROTIME in the last 168 hours. Cardiac Enzymes: No results for input(s): CKTOTAL, CKMB, CKMBINDEX, TROPONINI in the last 168 hours. BNP (last 3 results) No results for input(s): PROBNP in the last 8760 hours. HbA1C: No results for input(s): HGBA1C in the last 72 hours. CBG: No results for input(s): GLUCAP in the last 168 hours. Lipid Profile: No results for input(s): CHOL, HDL, LDLCALC, TRIG, CHOLHDL, LDLDIRECT in the last 72 hours. Thyroid Function Tests: No results for input(s): TSH, T4TOTAL, FREET4, T3FREE, THYROIDAB in the last 72 hours. Anemia Panel: No results for input(s): VITAMINB12, FOLATE, FERRITIN, TIBC, IRON, RETICCTPCT in the last 72 hours. Urine analysis:    Component Value Date/Time   COLORURINE AMBER (A) 07-Sep-2019 1609   APPEARANCEUR CLOUDY (A) Sep 07, 2019 1609   LABSPEC 1.024 09-07-2019 1609   PHURINE 5.0 Sep 07, 2019 1609   GLUCOSEU 50 (A) 2019/09/07 1609   HGBUR SMALL (A) 09/07/2019 1609   BILIRUBINUR NEGATIVE 09/07/2019 1609   KETONESUR 5 (A) September 07, 2019 1609   PROTEINUR 100 (A) 09/07/2019 1609   NITRITE NEGATIVE 2019/09/07 1609   LEUKOCYTESUR NEGATIVE 07-Sep-2019 1609    Radiological Exams on Admission: Dg Chest 2 View  Result Date: 07-Sep-2019 CLINICAL DATA:  Altered mental status. EXAM: CHEST - 2 VIEW COMPARISON:  03/10/2009 FINDINGS:  The cardio pericardial silhouette is enlarged. Low lung volumes. Patchy peripheral airspace opacity noted in both lung bases without pleural effusion. The visualized bony structures of the thorax are intact. Degenerative changes noted right shoulder. IMPRESSION: Patchy peripheral airspace opacity in both lung bases. Features suggest multifocal pneumonia and atypical/viral etiology should be considered. Electronically Signed  By: Misty Stanley M.D.   On: 08/15/2019 11:00   Ct Head Wo Contrast  Result Date: 08/15/2019 CLINICAL DATA:  Headache. Patient fell out of bed this morning. Pain to the center of the chest. Also with altered mental status. EXAM: CT HEAD WITHOUT CONTRAST CT CERVICAL SPINE WITHOUT CONTRAST TECHNIQUE: Multidetector CT imaging of the head and cervical spine was performed following the standard protocol without intravenous contrast. Multiplanar CT image reconstructions of the cervical spine were also generated. Exam is degraded by patient motion. COMPARISON:  None. FINDINGS: CT HEAD FINDINGS Brain: No evidence of acute infarction, hemorrhage, hydrocephalus, extra-axial collection or mass lesion/mass effect. There is ventricular and sulcal enlargement reflecting mild to moderate atrophy. White matter hypoattenuation is noted bilaterally consistent with moderate chronic microvascular ischemic change. Vascular: No hyperdense vessel or unexpected calcification. Skull: Normal. Negative for fracture or focal lesion. Sinuses/Orbits: Globes and orbits are unremarkable. Sinuses are clear. Other: None. CT CERVICAL SPINE FINDINGS Alignment: Slight anterolisthesis of C4 on C5, degenerative in origin. No other spondylolisthesis. Skull base and vertebrae: No acute fracture. No primary bone lesion or focal pathologic process. Soft tissues and spinal canal: No prevertebral fluid or swelling. No visible canal hematoma. Disc levels: Marked loss of disc height at C5-C6, C6-C7 and C7-T1 with spondylotic disc  bulging and endplate spurring. No convincing disc herniation. Facet degenerative changes also noted, greatest on the left at C3-C4 and C4-C5. Upper chest: Ground-glass type opacity left upper lobe, incompletely imaged on this exam. Right lung apex is clear. Other: None. IMPRESSION: HEAD CT 1. No acute intracranial abnormalities. 2. Atrophy and chronic microvascular ischemic change. CERVICAL CT 1. No fracture or acute skeletal abnormality. 2. Ground-glass opacity partly imaged in the left upper lobe. This could reflect pneumonia, and raises the possibility of COVID-19 infection. Electronically Signed   By: Lajean Manes M.D.   On: 08/15/2019 18:08   Ct Cervical Spine Wo Contrast  Result Date: 08/15/2019 CLINICAL DATA:  Headache. Patient fell out of bed this morning. Pain to the center of the chest. Also with altered mental status. EXAM: CT HEAD WITHOUT CONTRAST CT CERVICAL SPINE WITHOUT CONTRAST TECHNIQUE: Multidetector CT imaging of the head and cervical spine was performed following the standard protocol without intravenous contrast. Multiplanar CT image reconstructions of the cervical spine were also generated. Exam is degraded by patient motion. COMPARISON:  None. FINDINGS: CT HEAD FINDINGS Brain: No evidence of acute infarction, hemorrhage, hydrocephalus, extra-axial collection or mass lesion/mass effect. There is ventricular and sulcal enlargement reflecting mild to moderate atrophy. White matter hypoattenuation is noted bilaterally consistent with moderate chronic microvascular ischemic change. Vascular: No hyperdense vessel or unexpected calcification. Skull: Normal. Negative for fracture or focal lesion. Sinuses/Orbits: Globes and orbits are unremarkable. Sinuses are clear. Other: None. CT CERVICAL SPINE FINDINGS Alignment: Slight anterolisthesis of C4 on C5, degenerative in origin. No other spondylolisthesis. Skull base and vertebrae: No acute fracture. No primary bone lesion or focal pathologic  process. Soft tissues and spinal canal: No prevertebral fluid or swelling. No visible canal hematoma. Disc levels: Marked loss of disc height at C5-C6, C6-C7 and C7-T1 with spondylotic disc bulging and endplate spurring. No convincing disc herniation. Facet degenerative changes also noted, greatest on the left at C3-C4 and C4-C5. Upper chest: Ground-glass type opacity left upper lobe, incompletely imaged on this exam. Right lung apex is clear. Other: None. IMPRESSION: HEAD CT 1. No acute intracranial abnormalities. 2. Atrophy and chronic microvascular ischemic change. CERVICAL CT 1. No fracture or acute skeletal abnormality.  2. Ground-glass opacity partly imaged in the left upper lobe. This could reflect pneumonia, and raises the possibility of COVID-19 infection. Electronically Signed   By: Lajean Manes M.D.   On: 08/15/2019 18:08    EKG: Normal sinus rhythm, T wave inversions in leads 2,3,AVF, V1-V5.  No ST elevation or depression noted.  Assessment/Plan Principal Problem:   Sepsis due to COVID-19 [facility]) Active Problems:   Diabetes mellitus type II   Hyperlipidemia   CAD (coronary artery disease)   BPH (benign prostatic hyperplasia)   Anemia   Hypertension   Pneumonia due to COVID-19 virus   GERD (gastroesophageal reflux disease)   Thrombocytopenia (HCC)   Elevated liver enzymes   Sepsis secondary to Covid pneumonia: -Patient presented with fever, tachycardia, tachypnea, COVID-19 positive, chest x-ray shows multifocal pneumonia.\ -Patient currently on room air, on continuous pulse ox -Admit patient at stepdown unit at [facility] -Ordered inflammatory markers, blood culture is pending.  Will check lactic acid level, initial troponin 18-->17. -Start on IV fluids, IV steroids and consulted pharmacy for remdesivir. -Repeat inflammatory markers tomorrow. -Discussed with patient's wife that if COVID-19 pneumonitis gets worse we might potentially use Actemra off label, she denies any known history of  tuberculosis or hepatitis, understands the risk and benefit and wants to proceed with Actemra treatment if required. -Started on zinc and vitamin C  Altered mental status: -Reviewed CT head and cervical spine-negative for acute findings. -His altered mental status/confusion is likely secondary to sepsis secondary to Covid pneumonia -We will check UDS, ethanol level, ammonia level, TSH. -We will keep him n.p.o., until he passes the bedside swallow evaluation, neurochecks -Consulted PT/ST/OT  Elevated liver enzymes: -Likely secondary to Covid pneumonia infection -We will check ammonia level -Monitor LFTs closely.  Hypertension: Blood pressure is elevated -We will hold home blood pressure medication-lisinopril and metoprolol as patient is n.p.o. -Labetalol as needed.  Monitor blood pressure closely  Diabetes mellitus: We will hold Metformin and glyburide -Start on sliding scale insulin and monitor blood sugar closely.  Coronary artery disease status post stents: -We will hold aspirin, statin, metoprolol, lisinopril for now as patient is n.p.o. -On telemetry. -Troponin negative.  Hyperlipidemia: Hold statin for now.  BPH: Hold Flomax and Proscar  GERD: Hold Protonix  Anemia: H&H is stable, no signs of bleeding -Patient takes ferrous sulfate at home-hold for now.  Thrombocytopenia: Unknown etiology -No signs of bleeding, monitor level for now.   DVT prophylaxis: TED/SCD/Lovenox Code Status: Full code-confirmed with family Family Communication: None present at bedside.  Plan of care discussed with patient's wife and his daughter in length over the phone and they verbalized understanding and agreed with it. Disposition Plan: TBD Consults called: None Admission status: Inpatient at [place] MD [facility] Pager [phone]  If 7PM-7AM, please contact night-coverage [email or web address] Password [facility]  08/15/2019, 8:03 PM

## 2019-08-15 NOTE — Progress Notes (Signed)
Pharmacy Consult - Remdesivir  48 yom presenting COVID-19 positive with evidence of lower respiratory infection on chest imaging. Pharmacy consulted to dose Remdesivir. ALT is elevated on admit at 135 but <220.   Plan: Remdesivir 200mg  IV x 1; then 100mg  IV q24h to complete 5 total doses Monitor clinical progress, LFTs   Elicia Lamp, PharmD, BCPS Please check AMION for all [facility] contact numbers Clinical Pharmacist 08/15/2019 7:38 PM

## 2019-08-15 NOTE — ED Notes (Signed)
Safety mittens successful in keeping on VS monitoring. Pt. Relaxed and sleeping at this time. No longer attempting to exit bed.

## 2019-08-15 NOTE — ED Notes (Signed)
Safety mittens placed to ensure continued VS monitoring r/t pts confusion and pulling of leads and pulling at IV

## 2019-08-16 ENCOUNTER — Inpatient Hospital Stay (HOSPITAL_COMMUNITY): Payer: Medicare Other

## 2019-08-16 DIAGNOSIS — G9341 Metabolic encephalopathy: Secondary | ICD-10-CM | POA: Diagnosis present

## 2019-08-16 LAB — CBC WITH DIFFERENTIAL/PLATELET
Abs Immature Granulocytes: 0.04 10*3/uL (ref 0.00–0.07)
Basophils Absolute: 0 10*3/uL (ref 0.0–0.1)
Basophils Relative: 0 %
Eosinophils Absolute: 0 10*3/uL (ref 0.0–0.5)
Eosinophils Relative: 0 %
HCT: 32.8 % — ABNORMAL LOW (ref 39.0–52.0)
Hemoglobin: 11.1 g/dL — ABNORMAL LOW (ref 13.0–17.0)
Immature Granulocytes: 1 %
Lymphocytes Relative: 13 %
Lymphs Abs: 0.7 10*3/uL (ref 0.7–4.0)
MCH: 29.8 pg (ref 26.0–34.0)
MCHC: 33.8 g/dL (ref 30.0–36.0)
MCV: 87.9 fL (ref 80.0–100.0)
Monocytes Absolute: 0.3 10*3/uL (ref 0.1–1.0)
Monocytes Relative: 5 %
Neutro Abs: 4.6 10*3/uL (ref 1.7–7.7)
Neutrophils Relative %: 81 %
Platelets: 176 10*3/uL (ref 150–400)
RBC: 3.73 MIL/uL — ABNORMAL LOW (ref 4.22–5.81)
RDW: 14.5 % (ref 11.5–15.5)
WBC: 5.6 10*3/uL (ref 4.0–10.5)
nRBC: 0 % (ref 0.0–0.2)

## 2019-08-16 LAB — C-REACTIVE PROTEIN: CRP: 19.9 mg/dL — ABNORMAL HIGH (ref <1.0)

## 2019-08-16 LAB — POCT I-STAT 7, (LYTES, BLD GAS, ICA,H+H)
Acid-base deficit: 6 mmol/L — ABNORMAL HIGH (ref 0.0–2.0)
Bicarbonate: 18.4 mmol/L — ABNORMAL LOW (ref 20.0–28.0)
Calcium, Ion: 1.19 mmol/L (ref 1.15–1.40)
HCT: 31 % — ABNORMAL LOW (ref 39.0–52.0)
Hemoglobin: 10.5 g/dL — ABNORMAL LOW (ref 13.0–17.0)
O2 Saturation: 92 %
Potassium: 3.7 mmol/L (ref 3.5–5.1)
Sodium: 135 mmol/L (ref 135–145)
TCO2: 19 mmol/L — ABNORMAL LOW (ref 22–32)
pCO2 arterial: 29.9 mmHg — ABNORMAL LOW (ref 32.0–48.0)
pH, Arterial: 7.396 (ref 7.350–7.450)
pO2, Arterial: 64 mmHg — ABNORMAL LOW (ref 83.0–108.0)

## 2019-08-16 LAB — COMPREHENSIVE METABOLIC PANEL
ALT: 105 U/L — ABNORMAL HIGH (ref 0–44)
AST: 97 U/L — ABNORMAL HIGH (ref 15–41)
Albumin: 2.5 g/dL — ABNORMAL LOW (ref 3.5–5.0)
Alkaline Phosphatase: 48 U/L (ref 38–126)
Anion gap: 10 (ref 5–15)
BUN: 25 mg/dL — ABNORMAL HIGH (ref 8–23)
CO2: 22 mmol/L (ref 22–32)
Calcium: 8.3 mg/dL — ABNORMAL LOW (ref 8.9–10.3)
Chloride: 104 mmol/L (ref 98–111)
Creatinine, Ser: 0.92 mg/dL (ref 0.61–1.24)
GFR calc Af Amer: >60 mL/min (ref >60)
GFR calc non Af Amer: >60 mL/min (ref >60)
Glucose, Bld: 206 mg/dL — ABNORMAL HIGH (ref 70–99)
Potassium: 3.6 mmol/L (ref 3.5–5.1)
Sodium: 136 mmol/L (ref 135–145)
Total Bilirubin: 1.2 mg/dL (ref 0.3–1.2)
Total Protein: 6 g/dL — ABNORMAL LOW (ref 6.5–8.1)

## 2019-08-16 LAB — VITAMIN B12: Vitamin B-12: 3736 pg/mL — ABNORMAL HIGH (ref 180–914)

## 2019-08-16 LAB — FOLATE: Folate: 29.3 ng/mL (ref >5.9)

## 2019-08-16 LAB — FERRITIN: Ferritin: 685 ng/mL — ABNORMAL HIGH (ref 24–336)

## 2019-08-16 LAB — CBG MONITORING, ED
Glucose-Capillary: 209 mg/dL — ABNORMAL HIGH (ref 70–99)
Glucose-Capillary: 162 mg/dL — ABNORMAL HIGH (ref 70–99)
Glucose-Capillary: 138 mg/dL — ABNORMAL HIGH (ref 70–99)

## 2019-08-16 LAB — PHOSPHORUS: Phosphorus: 3.7 mg/dL (ref 2.5–4.6)

## 2019-08-16 LAB — GLUCOSE, CAPILLARY
Glucose-Capillary: 191 mg/dL — ABNORMAL HIGH (ref 70–99)
Glucose-Capillary: 202 mg/dL — ABNORMAL HIGH (ref 70–99)
Glucose-Capillary: 224 mg/dL — ABNORMAL HIGH (ref 70–99)

## 2019-08-16 LAB — URINE CULTURE

## 2019-08-16 LAB — D-DIMER, QUANTITATIVE: D-Dimer, Quant: 1.94 ug{FEU}/mL — ABNORMAL HIGH (ref 0.00–0.50)

## 2019-08-16 LAB — MAGNESIUM: Magnesium: 2 mg/dL (ref 1.7–2.4)

## 2019-08-16 MED ORDER — DEXTROSE IN LACTATED RINGERS 5 % IV SOLN
INTRAVENOUS | Status: DC
  Administered 2019-08-16: 20:00:00 via INTRAVENOUS

## 2019-08-16 MED ORDER — INSULIN ASPART 100 UNIT/ML ~~LOC~~ SOLN
0.0000 [IU] | SUBCUTANEOUS | Status: DC
  Administered 2019-08-16: 5 [IU] via SUBCUTANEOUS
  Administered 2019-08-16: 18:00:00 2 [IU] via SUBCUTANEOUS
  Administered 2019-08-16: 3 [IU] via SUBCUTANEOUS
  Administered 2019-08-17: 5 [IU] via SUBCUTANEOUS
  Administered 2019-08-17 (×2): 3 [IU] via SUBCUTANEOUS
  Administered 2019-08-17: 5 [IU] via SUBCUTANEOUS
  Administered 2019-08-17 – 2019-08-18 (×2): 3 [IU] via SUBCUTANEOUS
  Administered 2019-08-18: 2 [IU] via SUBCUTANEOUS
  Administered 2019-08-18: 3 [IU] via SUBCUTANEOUS
  Administered 2019-08-19: 2 [IU] via SUBCUTANEOUS
  Administered 2019-08-19: 12:00:00 5 [IU] via SUBCUTANEOUS
  Administered 2019-08-19: 2 [IU] via SUBCUTANEOUS

## 2019-08-16 MED ORDER — DEXAMETHASONE SODIUM PHOSPHATE 10 MG/ML IJ SOLN
6.0000 mg | INTRAMUSCULAR | Status: DC
  Administered 2019-08-17: 6 mg via INTRAVENOUS
  Filled 2019-08-16: qty 1

## 2019-08-16 NOTE — Progress Notes (Signed)
PROGRESS NOTE    Paul Terrell  [id number] DOB: [birth year] DOA: 08/15/2019 PCP: Paul Ghent, MD   Brief Narrative:  Paul Terrell is Paul Terrell 85 y.o. male with medical history significant of hypertension, hyperlipidemia, diabetes, coronary artery disease status post stents, BPH, GERD presents to emergency department due to increased confusion since couple of days.  History is somewhat limited due to patient's condition.  He is very hard of hearing as well.  I called patient's wife Ms. Paul Terrell on this [phone].  Patient's wife reports that patient was not doing well since couple of days, has trouble walking, not eating/drinking well, has dry cough, generalized weakness and lethargy.  he tested positive for COVID-19 yesterday.  Wife reports the patient fell from the bed this morning due to weakness.  No seizure, loss of consciousness, urinary/bowel incontinence.  No fever chills, nausea, vomiting, diarrhea, headache, blurry vision, chest pain, shortness of breath, palpitation, leg swelling.  Patient lives with his wife at home, uses cane for ambulation, independent on daily life activities, no history of smoking, alcohol, illicit drug use.  Assessment & Plan:   Principal Problem:   Sepsis due to COVID-19 [facility]) Active Problems:   Diabetes mellitus type II   Hyperlipidemia   CAD (coronary artery disease)   BPH (benign prostatic hyperplasia)   Anemia   Hypertension   Pneumonia due to COVID-19 virus   GERD (gastroesophageal reflux disease)   Thrombocytopenia (HCC)   Elevated liver enzymes   Sepsis secondary to Covid pneumonia: -Patient presented with fever, tachycardia, tachypnea, COVID-19 positive, chest x-ray shows multifocal pneumonia. - CXR 11/23 with L>R patchy bibasilar airspace opacities - Continues on RA at this time - urine cx multiple species, blood cx pending.  Procalcitonin <0.1, suggesting against bacterial infection. - Continue steroids, remdesivir  - Admitting  provider discussed use of actemra with wife who agreed to proceed if needed.  Consider convalescent plasma as needed as well. - Zinc, vitamin C - Daily inflammatory labs - elevated CRP, follow  COVID-19 Labs  Recent Labs    08/15/19 2044  DDIMER 3.37*  FERRITIN 468*  LDH 378*  CRP 19.8*    No results found for: 0000000  Acute Metabolic Encephalopathy - suspect 2/2 COVID 19 infection above - he's significantly hard of hearing per report, which complicates assessment, but at this time he responds to painful stimuli - received ativan yesterday afternoon ~1700.  He was placed in mittens last night as he was pulling off leads and pulling at IVs, was also apparently attempting to exit bed.  At baseline he's able to "do anything he wanted", like mowing lawn. - CT head/neck without acute findings - Given pt only reliably responding to painful stimuli at this time, will obtain MRI brain and EEG - consider neurology consult  - PT/OT/SLP - npo for now - delirium precautions - ammonia wnl, TSH wnl, urine cx with multiple species, utox negative, negative etoh level.  Follow ABG, B12, folate.   Elevated liver enzymes: -Likely secondary to Covid pneumonia infection -We will check ammonia level (wnl) -Monitor LFTs closely.  Hypertension: Blood pressure is elevated -We will hold home blood pressure medication-lisinopril and metoprolol as patient is n.p.o. -Labetalol as needed.  Monitor blood pressure closely  Diabetes mellitus: We will hold Metformin and glyburide -Start on sliding scale insulin and monitor blood sugar closely.  Coronary artery disease status post stents: -We will hold aspirin, statin, metoprolol, lisinopril for now as patient is n.p.o. -On telemetry. -Troponin flat  Hyperlipidemia: Hold statin for now.  BPH: Hold Flomax and Proscar  GERD: Hold Protonix  Anemia: H&H is stable, no signs of bleeding -Patient takes ferrous sulfate at home-hold for  now.  Thrombocytopenia: Unknown etiology -No signs of bleeding, monitor level for now. - improved, follow  DVT prophylaxis: lovenox Code Status: full  Family Communication: wife over phone 11/23 Disposition Plan: pending further improvement - to [facility] after MRI and EEG +/- neuro consultation  Consultants:   none  Procedures:   none  Antimicrobials:  Anti-infectives (From admission, onward)   Start     Dose/Rate Route Frequency Ordered Stop   08/16/19 1941  remdesivir 100 mg in sodium chloride 0.9 % 250 mL IVPB     100 mg 500 mL/hr over 30 Minutes Intravenous Every 24 hours 08/15/19 1941 08/20/19 1944   08/15/19 1945  remdesivir 200 mg in sodium chloride 0.9 % 250 mL IVPB     200 mg 500 mL/hr over 30 Minutes Intravenous Once 08/15/19 1941 08/15/19 2206     Subjective: Responds to painful stimuli   Objective: Vitals:   08/16/19 1130 08/16/19 1200 08/16/19 1230 08/16/19 1300  BP: 119/66 140/67 139/67 116/68  Pulse: 70 67 68 67  Resp: (!) 22 18 (!) 22 (!) 23  Temp:      TempSrc:      SpO2: 98% 100% 100% 99%    Intake/Output Summary (Last 24 hours) at 08/16/2019 1405 Last data filed at 08/15/2019 1622 Gross per 24 hour  Intake --  Output 200 ml  Net -200 ml   There were no vitals filed for this visit.  Examination:  General: No acute distress. Cardiovascular: RRR Lungs: slightly increased WOB Abdomen: Soft, nontender, nondistended Neurological: Limited exam, pt somnolent.  Responds to painful stimuli and appears to move all extremities. Skin: Warm and dry. No rashes or lesions. Extremities: No clubbing or cyanosis. No edema.   Data Reviewed: I have personally reviewed following labs and imaging studies  CBC: Recent Labs  Lab 08/15/19 1009 08/15/19 2044  WBC 4.9 5.7  HGB 11.8* 12.2*  HCT 34.6* 36.0*  MCV 88.3 87.2  PLT 147* 0000000   Basic Metabolic Panel: Recent Labs  Lab 08/15/19 1009 08/15/19 2044  NA 132*  --   K 3.6  --   CL 97*  --   CO2  23  --   GLUCOSE 145*  --   BUN 17  --   CREATININE 1.17 1.06  CALCIUM 8.7*  --    GFR: Estimated Creatinine Clearance: 45.9 mL/min (by C-G formula based on SCr of 1.06 mg/dL). Liver Function Tests: Recent Labs  Lab 08/15/19 1613  AST 126*  ALT 135*  ALKPHOS 55  BILITOT 1.5*  PROT 6.7  ALBUMIN 3.1*   No results for input(s): LIPASE, AMYLASE in the last 168 hours. Recent Labs  Lab 08/15/19 2020  AMMONIA 11   Coagulation Profile: No results for input(s): INR, PROTIME in the last 168 hours. Cardiac Enzymes: No results for input(s): CKTOTAL, CKMB, CKMBINDEX, TROPONINI in the last 168 hours. BNP (last 3 results) No results for input(s): PROBNP in the last 8760 hours. HbA1C: No results for input(s): HGBA1C in the last 72 hours. CBG: Recent Labs  Lab 08/15/19 2121 08/16/19 0811  GLUCAP 113* 209*   Lipid Profile: No results for input(s): CHOL, HDL, LDLCALC, TRIG, CHOLHDL, LDLDIRECT in the last 72 hours. Thyroid Function Tests: Recent Labs    08/15/19 2044  TSH 0.966   Anemia Panel: Recent  Labs    08/15/19 2044  FERRITIN 468*   Sepsis Labs: Recent Labs  Lab 08/15/19 2020 08/15/19 2044  PROCALCITON  --  <0.10  LATICACIDVEN 1.5  --     Recent Results (from the past 240 hour(s))  Blood culture (routine x 2)     Status: None (Preliminary result)   Collection Time: 08/15/19  4:07 PM   Specimen: BLOOD  Result Value Ref Range Status   Specimen Description BLOOD LEFT ANTECUBITAL  Final   Special Requests   Final    AEROBIC BOTTLE ONLY Blood Culture results may not be optimal due to an inadequate volume of blood received in culture bottles   Culture   Final    NO GROWTH < 24 HOURS Performed at [facility] [address], [place], [place] [address]    Report Status PENDING  Incomplete  Blood culture (routine x 2)     Status: None (Preliminary result)   Collection Time: 08/15/19  4:07 PM   Specimen: BLOOD  Result Value Ref Range Status   Specimen  Description BLOOD RIGHT ANTECUBITAL  Final   Special Requests   Final    BOTTLES DRAWN AEROBIC AND ANAEROBIC Blood Culture adequate volume   Culture   Final    NO GROWTH < 24 HOURS Performed at [facility], [place] [address] [place], [place] [address]    Report Status PENDING  Incomplete  Urine culture     Status: Abnormal   Collection Time: 08/15/19  4:09 PM   Specimen: Urine, Random  Result Value Ref Range Status   Specimen Description URINE, RANDOM  Final   Special Requests   Final    NONE Performed at [facility], [place] [address], [place], [place] [address]    Culture MULTIPLE SPECIES PRESENT, SUGGEST RECOLLECTION (Xandria Gallaga)  Final   Report Status 08/16/2019 FINAL  Final         Radiology Studies: Dg Chest 2 View  Result Date: 08/15/2019 CLINICAL DATA:  Altered mental status. EXAM: CHEST - 2 VIEW COMPARISON:  03/10/2009 FINDINGS: The cardio pericardial silhouette is enlarged. Low lung volumes. Patchy peripheral airspace opacity noted in both lung bases without pleural effusion. The visualized bony structures of the thorax are intact. Degenerative changes noted right shoulder. IMPRESSION: Patchy peripheral airspace opacity in both lung bases. Features suggest multifocal pneumonia and atypical/viral etiology should be considered. Electronically Signed   By: Misty Stanley M.D.   On: 08/15/2019 11:00   Ct Head Wo Contrast  Result Date: 08/15/2019 CLINICAL DATA:  Headache. Patient fell out of bed this morning. Pain to the center of the chest. Also with altered mental status. EXAM: CT HEAD WITHOUT CONTRAST CT CERVICAL SPINE WITHOUT CONTRAST TECHNIQUE: Multidetector CT imaging of the head and cervical spine was performed following the standard protocol without intravenous contrast. Multiplanar CT image reconstructions of the cervical spine were also generated. Exam is degraded by patient motion. COMPARISON:  None. FINDINGS: CT HEAD FINDINGS Brain: No evidence of acute infarction,  hemorrhage, hydrocephalus, extra-axial collection or mass lesion/mass effect. There is ventricular and sulcal enlargement reflecting mild to moderate atrophy. White matter hypoattenuation is noted bilaterally consistent with moderate chronic microvascular ischemic change. Vascular: No hyperdense vessel or unexpected calcification. Skull: Normal. Negative for fracture or focal lesion. Sinuses/Orbits: Globes and orbits are unremarkable. Sinuses are clear. Other: None. CT CERVICAL SPINE FINDINGS Alignment: Slight anterolisthesis of C4 on C5, degenerative in origin. No other spondylolisthesis. Skull base and vertebrae: No  acute fracture. No primary bone lesion or focal pathologic process. Soft tissues and spinal canal: No prevertebral fluid or swelling. No visible canal hematoma. Disc levels: Marked loss of disc height at C5-C6, C6-C7 and C7-T1 with spondylotic disc bulging and endplate spurring. No convincing disc herniation. Facet degenerative changes also noted, greatest on the left at C3-C4 and C4-C5. Upper chest: Ground-glass type opacity left upper lobe, incompletely imaged on this exam. Right lung apex is clear. Other: None. IMPRESSION: HEAD CT 1. No acute intracranial abnormalities. 2. Atrophy and chronic microvascular ischemic change. CERVICAL CT 1. No fracture or acute skeletal abnormality. 2. Ground-glass opacity partly imaged in the left upper lobe. This could reflect pneumonia, and raises the possibility of COVID-19 infection. Electronically Signed   By: Lajean Manes M.D.   On: 08/15/2019 18:08   Ct Cervical Spine Wo Contrast  Result Date: 08/15/2019 CLINICAL DATA:  Headache. Patient fell out of bed this morning. Pain to the center of the chest. Also with altered mental status. EXAM: CT HEAD WITHOUT CONTRAST CT CERVICAL SPINE WITHOUT CONTRAST TECHNIQUE: Multidetector CT imaging of the head and cervical spine was performed following the standard protocol without intravenous contrast. Multiplanar CT  image reconstructions of the cervical spine were also generated. Exam is degraded by patient motion. COMPARISON:  None. FINDINGS: CT HEAD FINDINGS Brain: No evidence of acute infarction, hemorrhage, hydrocephalus, extra-axial collection or mass lesion/mass effect. There is ventricular and sulcal enlargement reflecting mild to moderate atrophy. White matter hypoattenuation is noted bilaterally consistent with moderate chronic microvascular ischemic change. Vascular: No hyperdense vessel or unexpected calcification. Skull: Normal. Negative for fracture or focal lesion. Sinuses/Orbits: Globes and orbits are unremarkable. Sinuses are clear. Other: None. CT CERVICAL SPINE FINDINGS Alignment: Slight anterolisthesis of C4 on C5, degenerative in origin. No other spondylolisthesis. Skull base and vertebrae: No acute fracture. No primary bone lesion or focal pathologic process. Soft tissues and spinal canal: No prevertebral fluid or swelling. No visible canal hematoma. Disc levels: Marked loss of disc height at C5-C6, C6-C7 and C7-T1 with spondylotic disc bulging and endplate spurring. No convincing disc herniation. Facet degenerative changes also noted, greatest on the left at C3-C4 and C4-C5. Upper chest: Ground-glass type opacity left upper lobe, incompletely imaged on this exam. Right lung apex is clear. Other: None. IMPRESSION: HEAD CT 1. No acute intracranial abnormalities. 2. Atrophy and chronic microvascular ischemic change. CERVICAL CT 1. No fracture or acute skeletal abnormality. 2. Ground-glass opacity partly imaged in the left upper lobe. This could reflect pneumonia, and raises the possibility of COVID-19 infection. Electronically Signed   By: Lajean Manes M.D.   On: 08/15/2019 18:08   Dg Chest Port 1 View  Result Date: 08/16/2019 CLINICAL DATA:  Sepsis. COVID-19 positive EXAM: PORTABLE CHEST 1 VIEW COMPARISON:  08/15/2019 FINDINGS: Cardiomediastinal silhouette is mildly enlarged, unchanged. Persistent  patchy bibasilar airspace opacities, left greater than right, not significantly changed from prior. No pleural effusion. No pneumothorax. IMPRESSION: No significant change in patchy bibasilar airspace opacities, left greater than right. Electronically Signed   By: Davina Poke M.D.   On: 08/16/2019 11:09        Scheduled Meds:  enoxaparin (LOVENOX) injection  40 mg Subcutaneous Q24H   insulin aspart  0-15 Units Subcutaneous TID WC   insulin aspart  0-5 Units Subcutaneous QHS   methylPREDNISolone (SOLU-MEDROL) injection  0.5 mg/kg Intravenous Q12H   vitamin C  500 mg Oral Daily   zinc sulfate  220 mg Oral Daily   Continuous Infusions:  sodium chloride 125 mL/hr at 08/16/19 1300   remdesivir 100 mg in NS 250 mL       LOS: 1 day    Time spent: over 30 min    Fayrene Helper, MD [facility] Pager AMION  If 7PM-7AM, please contact night-coverage [email or web address] Password [email or web address] 08/16/2019, 2:05 PM

## 2019-08-16 NOTE — Progress Notes (Signed)
EEG complete - results pending 

## 2019-08-16 NOTE — ED Notes (Signed)
Attempted to recheck oral temp. Pt altered and biting thermometer.

## 2019-08-16 NOTE — Procedures (Signed)
Patient Name: Paul Terrell  MRN: [id number]  Epilepsy Attending: Lora Havens  Referring Physician/Provider: Dr Fayrene Helper Date: 08/16/2019 Duration: 23.26 mins  Patient history: 85yo COVID+male with ams. EEG to evaluate for seizure  Level of alertness: awake, asleep  AEDs during EEG study: None  Technical aspects: This EEG study was done with scalp electrodes positioned according to the 10-20 International system of electrode placement. Electrical activity was acquired at a sampling rate of 500Hz  and reviewed with a high frequency filter of 70Hz  and a low frequency filter of 1Hz . EEG data were recorded continuously and digitally stored.   DESCRIPTION: During awake state, no clear posterior dominant rhythm was seen. Sleep was characterized by vertex waves, sleep spindles (12-14hz ), maximal frontocentral. EEG showed continuous generalized 5-7hz  theta slowing.  Hyperventilation and photic stimulation were not performed.  ABNORMALITY - Continuous slow, generalized  IMPRESSION: This study is suggestive of mild to moderate diffuse encephalopathy, non specific to etiology. No seizures or epileptiform discharges were seen throughout the recording.  Waylan Busta Barbra Sarks

## 2019-08-16 NOTE — ED Notes (Signed)
Attempted report 

## 2019-08-16 NOTE — Progress Notes (Signed)
Spoke to pts daughter and provided an update.

## 2019-08-16 NOTE — ED Notes (Signed)
Patient transported to MRI 

## 2019-08-17 LAB — GLUCOSE, CAPILLARY
Glucose-Capillary: 195 mg/dL — ABNORMAL HIGH (ref 70–99)
Glucose-Capillary: 188 mg/dL — ABNORMAL HIGH (ref 70–99)
Glucose-Capillary: 242 mg/dL — ABNORMAL HIGH (ref 70–99)
Glucose-Capillary: 208 mg/dL — ABNORMAL HIGH (ref 70–99)

## 2019-08-17 LAB — CBC WITH DIFFERENTIAL/PLATELET
Abs Immature Granulocytes: 0.06 10*3/uL (ref 0.00–0.07)
Basophils Absolute: 0 10*3/uL (ref 0.0–0.1)
Basophils Relative: 0 %
Eosinophils Absolute: 0 10*3/uL (ref 0.0–0.5)
Eosinophils Relative: 0 %
HCT: 33 % — ABNORMAL LOW (ref 39.0–52.0)
Hemoglobin: 11.4 g/dL — ABNORMAL LOW (ref 13.0–17.0)
Immature Granulocytes: 1 %
Lymphocytes Relative: 11 %
Lymphs Abs: 0.9 10*3/uL (ref 0.7–4.0)
MCH: 29.4 pg (ref 26.0–34.0)
MCHC: 34.5 g/dL (ref 30.0–36.0)
MCV: 85.1 fL (ref 80.0–100.0)
Monocytes Absolute: 0.6 10*3/uL (ref 0.1–1.0)
Monocytes Relative: 8 %
Neutro Abs: 6.4 10*3/uL (ref 1.7–7.7)
Neutrophils Relative %: 80 %
Platelets: 191 10*3/uL (ref 150–400)
RBC: 3.88 MIL/uL — ABNORMAL LOW (ref 4.22–5.81)
RDW: 14.6 % (ref 11.5–15.5)
WBC: 8 10*3/uL (ref 4.0–10.5)
nRBC: 0 % (ref 0.0–0.2)

## 2019-08-17 LAB — COMPREHENSIVE METABOLIC PANEL
ALT: 82 U/L — ABNORMAL HIGH (ref 0–44)
AST: 66 U/L — ABNORMAL HIGH (ref 15–41)
Albumin: 2.4 g/dL — ABNORMAL LOW (ref 3.5–5.0)
Alkaline Phosphatase: 47 U/L (ref 38–126)
Anion gap: 12 (ref 5–15)
BUN: 28 mg/dL — ABNORMAL HIGH (ref 8–23)
CO2: 20 mmol/L — ABNORMAL LOW (ref 22–32)
Calcium: 8.5 mg/dL — ABNORMAL LOW (ref 8.9–10.3)
Chloride: 107 mmol/L (ref 98–111)
Creatinine, Ser: 0.87 mg/dL (ref 0.61–1.24)
GFR calc Af Amer: >60 mL/min (ref >60)
GFR calc non Af Amer: >60 mL/min (ref >60)
Glucose, Bld: 184 mg/dL — ABNORMAL HIGH (ref 70–99)
Potassium: 3.5 mmol/L (ref 3.5–5.1)
Sodium: 139 mmol/L (ref 135–145)
Total Bilirubin: 1.3 mg/dL — ABNORMAL HIGH (ref 0.3–1.2)
Total Protein: 5.4 g/dL — ABNORMAL LOW (ref 6.5–8.1)

## 2019-08-17 LAB — PHOSPHORUS: Phosphorus: 2.4 mg/dL — ABNORMAL LOW (ref 2.5–4.6)

## 2019-08-17 LAB — FERRITIN: Ferritin: 746 ng/mL — ABNORMAL HIGH (ref 24–336)

## 2019-08-17 LAB — D-DIMER, QUANTITATIVE: D-Dimer, Quant: 1.6 ug/mL-FEU — ABNORMAL HIGH (ref 0.00–0.50)

## 2019-08-17 LAB — MAGNESIUM: Magnesium: 1.9 mg/dL (ref 1.7–2.4)

## 2019-08-17 LAB — C-REACTIVE PROTEIN: CRP: 14.1 mg/dL — ABNORMAL HIGH (ref <1.0)

## 2019-08-17 MED ORDER — LORAZEPAM 2 MG/ML IJ SOLN
1.0000 mg | Freq: Four times a day (QID) | INTRAMUSCULAR | Status: DC | PRN
  Administered 2019-08-17 – 2019-08-18 (×2): 1 mg via INTRAVENOUS
  Filled 2019-08-17 (×3): qty 1

## 2019-08-17 MED ORDER — ADULT MULTIVITAMIN W/MINERALS CH
1.0000 | ORAL_TABLET | Freq: Every day | ORAL | Status: DC
  Administered 2019-08-18 – 2019-08-28 (×10): 1 via ORAL
  Filled 2019-08-17 (×11): qty 1

## 2019-08-17 MED ORDER — TAMSULOSIN HCL 0.4 MG PO CAPS
0.4000 mg | ORAL_CAPSULE | Freq: Every day | ORAL | Status: DC
  Administered 2019-08-17 – 2019-08-27 (×11): 0.4 mg via ORAL
  Filled 2019-08-17 (×11): qty 1

## 2019-08-17 MED ORDER — METOPROLOL SUCCINATE ER 25 MG PO TB24
50.0000 mg | ORAL_TABLET | Freq: Every day | ORAL | Status: DC
  Administered 2019-08-17 – 2019-08-22 (×5): 50 mg via ORAL
  Filled 2019-08-17 (×2): qty 2
  Filled 2019-08-17 (×2): qty 1
  Filled 2019-08-17 (×2): qty 2

## 2019-08-17 MED ORDER — LORAZEPAM 2 MG/ML IJ SOLN: 0.5000 mg | INTRAMUSCULAR | Status: DC | PRN

## 2019-08-17 MED ORDER — PRAVASTATIN SODIUM 10 MG PO TABS
20.0000 mg | ORAL_TABLET | Freq: Every day | ORAL | Status: DC
  Administered 2019-08-17 – 2019-08-27 (×11): 20 mg via ORAL
  Filled 2019-08-17 (×11): qty 2

## 2019-08-17 MED ORDER — FINASTERIDE 5 MG PO TABS
5.0000 mg | ORAL_TABLET | Freq: Every day | ORAL | Status: DC
  Administered 2019-08-18 – 2019-08-28 (×10): 5 mg via ORAL
  Filled 2019-08-17 (×11): qty 1

## 2019-08-17 MED ORDER — PANTOPRAZOLE SODIUM 40 MG PO TBEC
40.0000 mg | DELAYED_RELEASE_TABLET | Freq: Every day | ORAL | Status: DC
  Administered 2019-08-17 – 2019-08-28 (×11): 40 mg via ORAL
  Filled 2019-08-17 (×12): qty 1

## 2019-08-17 MED ORDER — LISINOPRIL 2.5 MG PO TABS
2.5000 mg | ORAL_TABLET | Freq: Every day | ORAL | Status: DC
  Administered 2019-08-17 – 2019-08-22 (×5): 2.5 mg via ORAL
  Filled 2019-08-17 (×7): qty 1

## 2019-08-17 NOTE — Progress Notes (Signed)
PROGRESS NOTE    Paul Terrell  [id number] DOB: December 13, 1930 DOA: 08/15/2019 PCP: Tonia Ghent, MD   Brief Narrative:  Paul Terrell is Paul Terrell 85 y.o. male with medical history significant of hypertension, hyperlipidemia, diabetes, coronary artery disease status post stents, BPH, GERD presents to emergency department due to increased confusion since couple of days.  He tested positive for COVID 19 on day prior to admission and ended up falling on the day of admission due to weakness.  At his baseline, was independent in his daily activities and apparently frequently mowed the lawn.    Assessment & Plan:   Principal Problem:   Sepsis due to COVID-19 [facility]) Active Problems:   Diabetes mellitus type II   Hyperlipidemia   CAD (coronary artery disease)   BPH (benign prostatic hyperplasia)   Anemia   Hypertension   Pneumonia due to COVID-19 virus   GERD (gastroesophageal reflux disease)   Thrombocytopenia (HCC)   Elevated liver enzymes   Acute metabolic encephalopathy   Sepsis secondary to Covid pneumonia: -Patient presented with fever, tachycardia, tachypnea, COVID-19 positive, chest x-ray shows multifocal pneumonia. - CXR 11/23 with L>R patchy bibasilar airspace opacities - Continues on RA at this time - urine cx multiple species, blood cx pending.  Procalcitonin <0.1, suggesting against bacterial infection. - Continue remdesivir.  Will hold additional steroids due to lack of O2 requirement at this time. - Admitting provider discussed use of actemra with wife who agreed to proceed if needed.  Consider convalescent plasma as needed as well. - Zinc, vitamin C - Daily inflammatory labs - elevated CRP, follow  COVID-19 Labs  Recent Labs    08/15/19 2044 08/16/19 1500 08/17/19 0522  DDIMER 3.37* 1.94* 1.60*  FERRITIN 468* 685* 746*  LDH 378*  --   --   CRP 19.8* 19.9* 14.1*    No results found for: 0000000  Acute Metabolic Encephalopathy - suspect 2/2 COVID 19 infection  above  - At baseline he's able to "do anything he wanted", like mowing lawn. - Continues to have delirium today requiring mitts and posey belt - ativan ordered prn (he's more awake now, but continues to be delirious and impulsive) - CT head/neck without acute findings - MRI brain without acute or focal abnormality - moderate generalized atrophy and confluent white matter disease - EEG - no seizures or epileptiform discharges, mild to moderate diffuse encephalopathy - consider neurology consult  - PT/OT/SLP - npo for now - delirium precautions - ammonia wnl, TSH wnl, urine cx with multiple species, utox negative, negative etoh level.  Follow ABG (normal pH, low pCO2, low pO2), B12 (wnl), folate (29.3).  - EKG to evaluate for QTc - eval whether antipsychotic maybe appropriate - Ativan prn   Fall: repeat head and neck CT.  No obvious injuries on exam.    Elevated liver enzymes: -Likely secondary to Covid pneumonia infection -We will check ammonia level (wnl) -Monitor LFTs closely.  Improving, follow.  Hypertension: Blood pressure is elevated -Resume metoprolol and lisinopril  Diabetes mellitus: We will hold Metformin and glyburide -Start on sliding scale insulin and monitor blood sugar closely.  Coronary artery disease status post stents: -We will hold aspirin, statin, metoprolol, lisinopril for now as patient is n.p.o. -On telemetry. -Troponin flat  Hyperlipidemia: resume statin   BPH: resume Flomax and Proscar  GERD: resume Protonix  Anemia: H&H is stable, no signs of bleeding -Patient takes ferrous sulfate at home-hold for now.  Thrombocytopenia: Unknown etiology -No signs of bleeding,  monitor level for now. - improved, follow  DVT prophylaxis: lovenox Code Status: full  Family Communication: wife over phone 11/24 Disposition Plan: pending further improvement - to [facility]   Consultants:   none  Procedures:   none  Antimicrobials:  Anti-infectives (From  admission, onward)   Start     Dose/Rate Route Frequency Ordered Stop   08/16/19 1941  remdesivir 100 mg in sodium chloride 0.9 % 250 mL IVPB     100 mg 500 mL/hr over 30 Minutes Intravenous Every 24 hours 08/15/19 1941 08/20/19 1944   08/15/19 1945  remdesivir 200 mg in sodium chloride 0.9 % 250 mL IVPB     200 mg 500 mL/hr over 30 Minutes Intravenous Once 08/15/19 1941 08/15/19 2206     Subjective: Confused  Objective: Vitals:   08/17/19 1025 08/17/19 1336 08/17/19 1400 08/17/19 1600  BP:  (!) 144/72  (!) 160/85  Pulse: 91 100 (!) 114 95  Resp: (!) 23 (!) 22  19  Temp:    97.9 F (36.6 C)  TempSrc:    Oral  SpO2: 92% 95% 95% 95%    Intake/Output Summary (Last 24 hours) at 08/17/2019 1941 Last data filed at 08/17/2019 0300 Gross per 24 hour  Intake 706.54 ml  Output --  Net 706.54 ml   There were no vitals filed for this visit.  Examination:  General: No acute distress.  Trying to get up out of bed. Cardiovascular: RRR Lungs: unlabored Abdomen: Soft, nontender, nondistended  Neurological: Alert and disoriented.  Impulsive. Moves all extremities 4. Cranial nerves II through XII grossly intact. Skin: Warm and dry. No rashes or lesions. Extremities: No clubbing or cyanosis. No edema.   Data Reviewed: I have personally reviewed following labs and imaging studies  CBC: Recent Labs  Lab 08/15/19 1009 08/15/19 2044 08/16/19 1500 08/16/19 1509 08/17/19 0522  WBC 4.9 5.7 5.6  --  8.0  NEUTROABS  --   --  4.6  --  6.4  HGB 11.8* 12.2* 11.1* 10.5* 11.4*  HCT 34.6* 36.0* 32.8* 31.0* 33.0*  MCV 88.3 87.2 87.9  --  85.1  PLT 147* 160 176  --  99991111   Basic Metabolic Panel: Recent Labs  Lab 08/15/19 1009 08/15/19 2044 08/16/19 1500 08/16/19 1509 08/17/19 0522  NA 132*  --  136 135 139  K 3.6  --  3.6 3.7 3.5  CL 97*  --  104  --  107  CO2 23  --  22  --  20*  GLUCOSE 145*  --  206*  --  184*  BUN 17  --  25*  --  28*  CREATININE 1.17 1.06 0.92  --  0.87   CALCIUM 8.7*  --  8.3*  --  8.5*  MG  --   --  2.0  --  1.9  PHOS  --   --  3.7  --  2.4*   GFR: Estimated Creatinine Clearance: 55.9 mL/min (by C-G formula based on SCr of 0.87 mg/dL). Liver Function Tests: Recent Labs  Lab 08/15/19 1613 08/16/19 1500 08/17/19 0522  AST 126* 97* 66*  ALT 135* 105* 82*  ALKPHOS 55 48 47  BILITOT 1.5* 1.2 1.3*  PROT 6.7 6.0* 5.4*  ALBUMIN 3.1* 2.5* 2.4*   No results for input(s): LIPASE, AMYLASE in the last 168 hours. Recent Labs  Lab 08/15/19 2020  AMMONIA 11   Coagulation Profile: No results for input(s): INR, PROTIME in the last 168 hours. Cardiac Enzymes: No  results for input(s): CKTOTAL, CKMB, CKMBINDEX, TROPONINI in the last 168 hours. BNP (last 3 results) No results for input(s): PROBNP in the last 8760 hours. HbA1C: No results for input(s): HGBA1C in the last 72 hours. CBG: Recent Labs  Lab 08/16/19 2341 08/17/19 0255 08/17/19 0802 08/17/19 1156 08/17/19 1555  GLUCAP 224* 195* 188* 242* 208*   Lipid Profile: No results for input(s): CHOL, HDL, LDLCALC, TRIG, CHOLHDL, LDLDIRECT in the last 72 hours. Thyroid Function Tests: Recent Labs    08/15/19 2044  TSH 0.966   Anemia Panel: Recent Labs    08/16/19 1500 08/17/19 0522  VITAMINB12 3,736*  --   FOLATE 29.3  --   FERRITIN 685* 746*   Sepsis Labs: Recent Labs  Lab 08/15/19 2020 08/15/19 2044  PROCALCITON  --  <0.10  LATICACIDVEN 1.5  --     Recent Results (from the past 240 hour(s))  Blood culture (routine x 2)     Status: None (Preliminary result)   Collection Time: 08/15/19  4:07 PM   Specimen: BLOOD  Result Value Ref Range Status   Specimen Description BLOOD LEFT ANTECUBITAL  Final   Special Requests   Final    AEROBIC BOTTLE ONLY Blood Culture results may not be optimal due to an inadequate volume of blood received in culture bottles   Culture   Final    NO GROWTH 2 DAYS Performed at [facility] [address] [place], [place]  [address]    Report Status PENDING  Incomplete  Blood culture (routine x 2)     Status: None (Preliminary result)   Collection Time: 08/15/19  4:07 PM   Specimen: BLOOD  Result Value Ref Range Status   Specimen Description BLOOD RIGHT ANTECUBITAL  Final   Special Requests   Final    BOTTLES DRAWN AEROBIC AND ANAEROBIC Blood Culture adequate volume   Culture   Final    NO GROWTH 2 DAYS Performed at [facility], [place] [address] [place], [place] [address]    Report Status PENDING  Incomplete  Urine culture     Status: Abnormal   Collection Time: 08/15/19  4:09 PM   Specimen: Urine, Random  Result Value Ref Range Status   Specimen Description URINE, RANDOM  Final   Special Requests   Final    NONE Performed at [facility], [place] [address], [place], [place] [address]    Culture MULTIPLE SPECIES PRESENT, SUGGEST RECOLLECTION (Richetta Cubillos)  Final   Report Status 08/16/2019 FINAL  Final         Radiology Studies: Mr Brain Wo Contrast  Result Date: 08/16/2019 CLINICAL DATA:  Encephalopathy. COVID-19 infection. EXAM: MRI HEAD WITHOUT CONTRAST TECHNIQUE: Multiplanar, multiecho pulse sequences of the brain and surrounding structures were obtained without intravenous contrast. COMPARISON:  None. FINDINGS: Brain: Diffusion-weighted images demonstrate no acute or subacute infarction. Hemorrhage or mass lesion present. Moderate generalized atrophy and confluent white matter disease is present bilaterally. Subcortical T2 hyperintensities are noted as well. The ventricles are proportionate to the degree of atrophy. No significant extraaxial fluid collection is present. White matter changes extend into the brainstem. Cerebellum is unremarkable. Vascular: Flow is present in the major intracranial arteries. Skull and upper cervical spine: The craniocervical junction is normal. Upper cervical spine is within normal limits. Marrow signal is unremarkable. Sinuses/Orbits: The paranasal sinuses and mastoid  air cells are clear. Bilateral lens replacements are noted. Globes and orbits are otherwise unremarkable. IMPRESSION: 1. No acute or focal  abnormality to explain the patient's symptoms. 2. Moderate generalized atrophy and confluent white matter disease. This likely reflects the sequela of chronic microvascular ischemia. Electronically Signed   By: San Morelle M.D.   On: 08/16/2019 14:43   Dg Chest Port 1 View  Result Date: 08/16/2019 CLINICAL DATA:  Sepsis. COVID-19 positive EXAM: PORTABLE CHEST 1 VIEW COMPARISON:  08/15/2019 FINDINGS: Cardiomediastinal silhouette is mildly enlarged, unchanged. Persistent patchy bibasilar airspace opacities, left greater than right, not significantly changed from prior. No pleural effusion. No pneumothorax. IMPRESSION: No significant change in patchy bibasilar airspace opacities, left greater than right. Electronically Signed   By: Davina Poke M.D.   On: 08/16/2019 11:09        Scheduled Meds:  dexamethasone (DECADRON) injection  6 mg Intravenous Q24H   enoxaparin (LOVENOX) injection  40 mg Subcutaneous Q24H   insulin aspart  0-15 Units Subcutaneous Q4H   vitamin C  500 mg Oral Daily   zinc sulfate  220 mg Oral Daily   Continuous Infusions:  dextrose 5% lactated ringers 75 mL/hr at 08/16/19 2015   remdesivir 100 mg in NS 250 mL 100 mg (08/16/19 2009)     LOS: 2 days    Time spent: over 30 min    Fayrene Helper, MD [facility] Pager AMION  If 7PM-7AM, please contact night-coverage [email or web address] Password [email or web address] 08/17/2019, 7:41 PM

## 2019-08-17 NOTE — Progress Notes (Signed)
Inpatient Diabetes Program Recommendations  AACE/ADA: New Consensus Statement on Inpatient Glycemic Control (2015)  Target Ranges:  Prepandial:   less than 140 mg/dL      Peak postprandial:   less than 180 mg/dL (1-2 hours)      Critically ill patients:  140 - 180 mg/dL   Results for Paul Terrell, Paul Terrell (MRN [id number]) as of 08/17/2019 12:21  Ref. Range 08/16/2019 23:41 08/17/2019 02:55 08/17/2019 08:02  Glucose-Capillary Latest Ref Range: 70 - 99 mg/dL 224 (H) 195 (H) 188 (H)     Admit COVID+/ PNA/ Sepsis  History: DM2  Home DM Meds: Glyburide 10 mg daily        Metformin 500 mg BID  Current Orders: Novolog Moderate Correction Scale/ SSI (0-15 units) Q4 hours       Got Solumedrol yest and then switched to Decadron 6 mg Daily today.    Novolog SSI started yest AM.     MD- May consider starting low dose basal insulin for patient while getting Decadron:  Lantus 7 units Daily (0.1 units/kg)     --Will follow patient during hospitalization--  Wyn Quaker RN, MSN, CDE Diabetes Coordinator Inpatient Glycemic Control Team Team Pager: [phone] (8a-5p)

## 2019-08-17 NOTE — Progress Notes (Signed)
OT Cancellation Note  Patient Details Name: Paul Terrell MRN: [id number] DOB: 03/22/1931   Cancelled Treatment:    Reason Eval/Treat Not Completed: Medical issues which prohibited therapy(Pt will fall from bed this morning. Per MD, hold for scans after fall. Will return as schedule allows.)  [place], OTR/L Acute Rehab Pager: [phone] Office: [phone] 08/17/2019, 12:05 PM

## 2019-08-17 NOTE — Evaluation (Signed)
Clinical/Bedside Swallow Evaluation Patient Details  Name: Paul Terrell MRN: [id number] Date of Birth: [birth year]  Today's Date: 08/17/2019 Time: SLP Start Time (ACUTE ONLY): 1200 SLP Stop Time (ACUTE ONLY): [id number] SLP Time Calculation (min) (ACUTE ONLY): 43 min  Past Medical History:  Past Medical History:  Diagnosis Date  . Arthritis   . Basal cell carcinoma of scalp 12/16/07   Reexcision (Dr. Merril Abbe. Teressa Senter)  . BPH (benign prostatic hyperplasia)   . CAD (coronary artery disease) 11/01   stent placed  . Cataract   . Diabetes mellitus type II 11/01  . GERD (gastroesophageal reflux disease)    occasionally  . Hearing aid worn   . HOH (hard of hearing)   . Hyperlipemia 11/01  . Hypertension 11/01  . Ruptured disc, cervical    Neck C7 (Dr. Pearlie Oyster)   Past Surgical History:  Past Surgical History:  Procedure Laterality Date  . BASAL CELL CARCINOMA EXCISION    . BRONCHIAL BRUSHINGS  05/16/2018   Procedure: ESOPHAGEAL BRUSHINGS;  Surgeon: Irving Copas., MD;  Location: [place];  Service: Gastroenterology;;  . Lillard Anes  02/2000   Dr. Janus Molder - Right  . CERVICAL FUSION    . COLONOSCOPY     2012  . COLONOSCOPY N/A 05/17/2018   Procedure: COLONOSCOPY;  Surgeon: Mansouraty, Telford Nab., MD;  Location: [place];  Service: Gastroenterology;  Laterality: N/A;  . ESOPHAGOGASTRODUODENOSCOPY (EGD) WITH PROPOFOL N/A 05/16/2018   Procedure: ESOPHAGOGASTRODUODENOSCOPY (EGD) WITH PROPOFOL ;  Surgeon: Rush Landmark Telford Nab., MD;  Location: [place];  Service: Gastroenterology;  Laterality: N/A;  . HOT HEMOSTASIS N/A 05/16/2018   Procedure: HOT HEMOSTASIS (ARGON PLASMA COAGULATION/BICAP);  Surgeon: Irving Copas., MD;  Location: [place];  Service: Gastroenterology;  Laterality: N/A;  . KNEE ARTHROSCOPY  09/1999   Right  . LACERATION REPAIR  09/1999   Right Hand (Dr. Fredna Dow)  . LITHOTRIPSY  1991   Dr. Elyse Jarvis  . POLYPECTOMY  05/17/2018   Procedure: POLYPECTOMY;  Surgeon: Mansouraty, Telford Nab., MD;  Location: [place];  Service: Gastroenterology;;  . Cherre Robins CUFF REPAIR  02/05   Left   HPI:  85 y.o. male with medical history significant of hypertension, hyperlipidemia, diabetes, coronary artery disease status post stents, BPH, GERD, HOH presents to emergency department due to increased confusion over several days, Dx pna, sepsis, COVID +, metabolic encephalopathy.   Assessment / Plan / Recommendation Clinical Impression  Pt participated in clinical swallow evaluation.  Oral care provided.  Pt alert, communicative, quite confused but participatory, animated and smiling.  He demonstrated quite functional oropharyngeal swallow with + ability to feed himself graham crackers with adequate mastication, + oral clearance - no difficulty.  He consumed 12 oz water with no s/s of aspiration.  There are no s/s of dysphagia.  Recommend starting a regular diet, thin liquids;  Meds whole in liquid.  Pt will require assist with tray set-up and feeding, full supervision due to cognitive deficits.  No SLP f/u is warranted. Our service will sign off.  SLP Visit Diagnosis: Dysphagia, oropharyngeal phase (R13.12)    Aspiration Risk  No limitations    Diet Recommendation   regular solids, thin liquids  Medication Administration: Whole meds with liquid    Other  Recommendations Oral Care Recommendations: Oral care BID   Follow up Recommendations None      Frequency and Duration            Prognosis        Swallow Study   General  Date of Onset: 08/15/19 HPI: 85 y.o. male with medical history significant of hypertension, hyperlipidemia, diabetes, coronary artery disease status post stents, BPH, GERD, HOH presents to emergency department due to increased confusion over several days, Dx pna, sepsis, COVID +, metabolic encephalopathy. Type of Study: Bedside Swallow Evaluation Previous Swallow Assessment: no Diet Prior to this Study:  NPO Respiratory Status: Nasal cannula History of Recent Intubation: No Behavior/Cognition: Alert;Cooperative;Confused Oral Cavity Assessment: Dried secretions Oral Care Completed by SLP: Yes Oral Cavity - Dentition: Dentures, top Vision: Functional for self-feeding Self-Feeding Abilities: Needs assist Patient Positioning: Upright in bed Baseline Vocal Quality: Normal Volitional Cough: Cognitively unable to elicit Volitional Swallow: Unable to elicit    Oral/Motor/Sensory Function Overall Oral Motor/Sensory Function: Within functional limits   Ice Chips Ice chips: Within functional limits   Thin Liquid Thin Liquid: Within functional limits    Nectar Thick Nectar Thick Liquid: Not tested   Honey Thick Honey Thick Liquid: Not tested   Puree Puree: Within functional limits   Solid     Solid: Within functional limits      Juan Quam Laurice 08/17/2019,12:51 PM  Estill Bamberg L. Tivis Ringer, [place] Office number [phone] Pager [phone]

## 2019-08-17 NOTE — Progress Notes (Signed)
PT Cancellation Note  Patient Details Name: Paul Terrell MRN: [id number] DOB: 09/08/1931   Cancelled Treatment:    Reason Eval/Treat Not Completed: (P) Medical issues which prohibited therapy Pt had unwitnessed fall this morning. MD has ordered head scan and request hold pending results. PT will follow back tomorrow to determine appropriateness of Evaluation.   Evonte Prestage B. Migdalia Dk PT, DPT Acute Rehabilitation Services Pager [phone] Office [phone]    [place] 08/17/2019, 12:52 PM

## 2019-08-17 NOTE — Progress Notes (Signed)
Pt found out of bed on floor mat in room. MD notified. VS stable at this time. Will continue to monitor.

## 2019-08-18 ENCOUNTER — Inpatient Hospital Stay (HOSPITAL_COMMUNITY): Payer: Medicare Other

## 2019-08-18 DIAGNOSIS — N400 Enlarged prostate without lower urinary tract symptoms: Secondary | ICD-10-CM

## 2019-08-18 LAB — CBC WITH DIFFERENTIAL/PLATELET
Abs Immature Granulocytes: 0.09 10*3/uL — ABNORMAL HIGH (ref 0.00–0.07)
Basophils Absolute: 0 10*3/uL (ref 0.0–0.1)
Basophils Relative: 0 %
Eosinophils Absolute: 0 10*3/uL (ref 0.0–0.5)
Eosinophils Relative: 0 %
HCT: 34.9 % — ABNORMAL LOW (ref 39.0–52.0)
Hemoglobin: 12 g/dL — ABNORMAL LOW (ref 13.0–17.0)
Immature Granulocytes: 1 %
Lymphocytes Relative: 14 %
Lymphs Abs: 1.2 10*3/uL (ref 0.7–4.0)
MCH: 29.2 pg (ref 26.0–34.0)
MCHC: 34.4 g/dL (ref 30.0–36.0)
MCV: 84.9 fL (ref 80.0–100.0)
Monocytes Absolute: 0.7 10*3/uL (ref 0.1–1.0)
Monocytes Relative: 8 %
Neutro Abs: 6.7 10*3/uL (ref 1.7–7.7)
Neutrophils Relative %: 77 %
Platelets: 222 10*3/uL (ref 150–400)
RBC: 4.11 MIL/uL — ABNORMAL LOW (ref 4.22–5.81)
RDW: 14.4 % (ref 11.5–15.5)
WBC: 8.7 10*3/uL (ref 4.0–10.5)
nRBC: 0 % (ref 0.0–0.2)

## 2019-08-18 LAB — FERRITIN: Ferritin: 441 ng/mL — ABNORMAL HIGH (ref 24–336)

## 2019-08-18 LAB — COMPREHENSIVE METABOLIC PANEL
ALT: 91 U/L — ABNORMAL HIGH (ref 0–44)
AST: 112 U/L — ABNORMAL HIGH (ref 15–41)
Albumin: 2.8 g/dL — ABNORMAL LOW (ref 3.5–5.0)
Alkaline Phosphatase: 54 U/L (ref 38–126)
Anion gap: 13 (ref 5–15)
BUN: 19 mg/dL (ref 8–23)
CO2: 22 mmol/L (ref 22–32)
Calcium: 8.6 mg/dL — ABNORMAL LOW (ref 8.9–10.3)
Chloride: 103 mmol/L (ref 98–111)
Creatinine, Ser: 0.79 mg/dL (ref 0.61–1.24)
GFR calc Af Amer: >60 mL/min (ref >60)
GFR calc non Af Amer: >60 mL/min (ref >60)
Glucose, Bld: 102 mg/dL — ABNORMAL HIGH (ref 70–99)
Potassium: 3.4 mmol/L — ABNORMAL LOW (ref 3.5–5.1)
Sodium: 138 mmol/L (ref 135–145)
Total Bilirubin: 1 mg/dL (ref 0.3–1.2)
Total Protein: 6 g/dL — ABNORMAL LOW (ref 6.5–8.1)

## 2019-08-18 LAB — GLUCOSE, CAPILLARY
Glucose-Capillary: 79 mg/dL (ref 70–99)
Glucose-Capillary: 102 mg/dL — ABNORMAL HIGH (ref 70–99)
Glucose-Capillary: 135 mg/dL — ABNORMAL HIGH (ref 70–99)
Glucose-Capillary: 167 mg/dL — ABNORMAL HIGH (ref 70–99)
Glucose-Capillary: 189 mg/dL — ABNORMAL HIGH (ref 70–99)
Glucose-Capillary: 190 mg/dL — ABNORMAL HIGH (ref 70–99)
Glucose-Capillary: 154 mg/dL — ABNORMAL HIGH (ref 70–99)

## 2019-08-18 LAB — C-REACTIVE PROTEIN: CRP: 8.8 mg/dL — ABNORMAL HIGH (ref <1.0)

## 2019-08-18 LAB — MAGNESIUM: Magnesium: 1.8 mg/dL (ref 1.7–2.4)

## 2019-08-18 LAB — PHOSPHORUS: Phosphorus: 2.2 mg/dL — ABNORMAL LOW (ref 2.5–4.6)

## 2019-08-18 LAB — D-DIMER, QUANTITATIVE: D-Dimer, Quant: 1.68 ug/mL-FEU — ABNORMAL HIGH (ref 0.00–0.50)

## 2019-08-18 MED ORDER — POTASSIUM CHLORIDE CRYS ER 20 MEQ PO TBCR
20.0000 meq | EXTENDED_RELEASE_TABLET | Freq: Once | ORAL | Status: AC
  Administered 2019-08-18: 20 meq via ORAL
  Filled 2019-08-18: qty 1

## 2019-08-18 NOTE — Progress Notes (Signed)
PT Cancellation Note  Patient Details Name: Paul Terrell MRN: [id number] DOB: 15-Feb-1931   Cancelled Treatment:    Reason Eval/Treat Not Completed: Other (comment). Pt was in process of transferring to [facility]. Will make [facility] staff aware.   Shary Decamp [facility] 08/18/2019, 2:49 PM Lowell Makara [place] Pager [phone] Office [phone]

## 2019-08-18 NOTE — Progress Notes (Signed)
[facility]  PROGRESS NOTE  Paul Terrell [id number] DOB: 1931/03/03 DOA: 08/15/2019 PCP: Tonia Ghent, MD   Brief HPI:   85 year old male with a history of hypertension, hyperlipidemia, diabetes mellitus, CAD s/p stent placement, BPH, GERD came to ED with worsening confusion for past couple of days. He was tested positive for COVID-19 on day prior to admission and ended up falling on the day of admission due to weakness. At baseline patient was independent in ADLs.    Subjective   CT head and neck obtained yesterday was unremarkable. Patient denies chest pain or shortness of breath. Continues to be confused.   Assessment/Plan:     1. Sepsis secondary to Covid pneumonia-patient presented with fever, tachycardia, tachypnea. COVID-19 was positive. Chest x-ray showed multifocal pneumonia. Currently patient is on room air, O2 sats 93 to 95%. Urine culture grew multiple species. Blood culture is pending. Procalcitonin less than 0.1. Started on remdesivir. Admitting provider discussed with wife regarding use of Actemra if needed, who would agree to proceed in case he needs it. CRP is down today 8.8. Follow inflammatory markers.  2. Altered mental status-unclear etiology, likely due to COVID-19 infection. At baseline patient is alert and oriented x3, able to perform ADLs at home. Patient continues to be delirious in the hospital. CT head and neck showed no acute findings. MRI brain showed moderate generalized atrophy and confluent white matter disease. No focal abnormality. EEG showed no seizures or epileptiform discharges. Mild to moderate diffuse encephalopathy. Ammonia, TSH are within normal limits. Urine culture grew multiple species. Urine toxicology negative, alcohol level negative. B12 normal, folate 29.3. Continue as needed Ativan.  3. S/p fall-patient had unwitnessed fall in the hospital. Repeat head and neck CT was unremarkable.  4. Transaminitis-patient has mild elevation of  AST and ALT likely from COVID-19 infection.  5. Hypertension-blood pressure is stable, continue metoprolol, lisinopril.  6. Diabetes mellitus-continue sliding scale insulin with NovoLog. Metformin and glyburide are currently on hold.  7. CAD s/p stent placement-aspirin, statin are currently on hold as patient is n.p.o. Troponin is unremarkable.  8. BPH-continue Flomax, Proscar.  9. Thrombocytopenia-platelets count was 147, improved to 222 today. Continue to monitor closely.     CBG: Recent Labs  Lab 08/17/19 1555 08/17/19 1934 08/17/19 2341 08/18/19 0409 08/18/19 0757  GLUCAP 208* 167* 135* 79 102*    CBC: Recent Labs  Lab 08/15/19 1009 08/15/19 2044 08/16/19 1500 08/16/19 1509 08/17/19 0522 08/18/19 0408  WBC 4.9 5.7 5.6  --  8.0 8.7  NEUTROABS  --   --  4.6  --  6.4 6.7  HGB 11.8* 12.2* 11.1* 10.5* 11.4* 12.0*  HCT 34.6* 36.0* 32.8* 31.0* 33.0* 34.9*  MCV 88.3 87.2 87.9  --  85.1 84.9  PLT 147* 160 176  --  191 [id number]    Basic Metabolic Panel: Recent Labs  Lab 08/15/19 1009 08/15/19 2044 08/16/19 1500 08/16/19 1509 08/17/19 0522 08/18/19 0408  NA 132*  --  136 135 139 138  K 3.6  --  3.6 3.7 3.5 3.4*  CL 97*  --  104  --  107 103  CO2 23  --  22  --  20* 22  GLUCOSE 145*  --  206*  --  184* 102*  BUN 17  --  25*  --  28* 19  CREATININE 1.17 1.06 0.92  --  0.87 0.79  CALCIUM 8.7*  --  8.3*  --  8.5* 8.6*  MG  --   --  2.0  --  1.9 1.8  PHOS  --   --  3.7  --  2.4* 2.2*    COVID-19 Labs  Recent Labs    08/15/19 2044 08/16/19 1500 08/17/19 0522 08/18/19 0408  DDIMER 3.37* 1.94* 1.60* 1.68*  FERRITIN 468* 685* 746* 441*  LDH 378*  --   --   --   CRP 19.8* 19.9* 14.1* 8.8*     DVT prophylaxis: Lovenox  Code Status: Full code  Family Communication: No family at bedside  Disposition Plan: likely home when medically ready for discharge        BMI  Estimated body mass index is 27.83 kg/m as calculated from the following:   Height  as of 08/05/19: 5\' 4"  (1.626 m).   Weight as of 08/05/19: 73.5 kg.  Scheduled medications:  . enoxaparin (LOVENOX) injection  40 mg Subcutaneous Q24H  . finasteride  5 mg Oral Daily  . insulin aspart  0-15 Units Subcutaneous Q4H  . lisinopril  2.5 mg Oral Daily  . metoprolol succinate  50 mg Oral Daily  . multivitamin with minerals  1 tablet Oral Daily  . pantoprazole  40 mg Oral Daily  . pravastatin  20 mg Oral QHS  . tamsulosin  0.4 mg Oral QHS  . vitamin C  500 mg Oral Daily  . zinc sulfate  220 mg Oral Daily    Consultants:    Procedures:    Antibiotics:   Anti-infectives (From admission, onward)   Start     Dose/Rate Route Frequency Ordered Stop   08/16/19 1941  remdesivir 100 mg in sodium chloride 0.9 % 250 mL IVPB     100 mg 500 mL/hr over 30 Minutes Intravenous Every 24 hours 08/15/19 1941 08/20/19 1944   08/15/19 1945  remdesivir 200 mg in sodium chloride 0.9 % 250 mL IVPB     200 mg 500 mL/hr over 30 Minutes Intravenous Once 08/15/19 1941 08/15/19 2206       Objective   Vitals:   08/17/19 2107 08/17/19 2345 08/18/19 0416 08/18/19 0750  BP:  (!) 144/70 (!) 142/67 (!) 147/69  Pulse: 78 66 76   Resp:  (!) 24 20   Temp:  98.1 F (36.7 C)  98 F (36.7 C)  TempSrc:  Oral  Axillary  SpO2:  96% 95%    No intake or output data in the 24 hours ending 08/18/19 1333 There were no vitals filed for this visit.   Physical Examination:    General: Appears in no acute distress  Cardiovascular: S1-S2, regular  Respiratory: Clear to auscultation bilaterally  Abdomen: Abdomen is soft, nontender, no organomegaly  Extremities: No edema in the lower extremities  Neurologic: Alert, disoriented, moving all extremities     Data Reviewed: I have personally reviewed following labs and imaging studies   Recent Results (from the past 240 hour(s))  Blood culture (routine x 2)     Status: None (Preliminary result)   Collection Time: 08/15/19  4:07 PM    Specimen: BLOOD  Result Value Ref Range Status   Specimen Description BLOOD LEFT ANTECUBITAL  Final   Special Requests   Final    AEROBIC BOTTLE ONLY Blood Culture results may not be optimal due to an inadequate volume of blood received in culture bottles   Culture   Final    NO GROWTH 3 DAYS Performed at [facility], [place] [address], [place], [place] [address]    Report Status PENDING  Incomplete  Blood culture (routine x 2)     Status: None (Preliminary result)   Collection Time: 08/15/19  4:07 PM   Specimen: BLOOD  Result Value Ref Range Status   Specimen Description BLOOD RIGHT ANTECUBITAL  Final   Special Requests   Final    BOTTLES DRAWN AEROBIC AND ANAEROBIC Blood Culture adequate volume   Culture   Final    NO GROWTH 3 DAYS Performed at [facility], [address] [place], [place] [address]    Report Status PENDING  Incomplete  Urine culture     Status: Abnormal   Collection Time: 08/15/19  4:09 PM   Specimen: Urine, Random  Result Value Ref Range Status   Specimen Description URINE, RANDOM  Final   Special Requests   Final    NONE Performed at [facility], [place] [address] [place], [place] [address]    Culture MULTIPLE SPECIES PRESENT, SUGGEST RECOLLECTION (A)  Final   Report Status 08/16/2019 FINAL  Final     Liver Function Tests: Recent Labs  Lab 08/15/19 1613 08/16/19 1500 08/17/19 0522 08/18/19 0408  AST 126* 97* 66* 112*  ALT 135* 105* 82* 91*  ALKPHOS 55 48 47 54  BILITOT 1.5* 1.2 1.3* 1.0  PROT 6.7 6.0* 5.4* 6.0*  ALBUMIN 3.1* 2.5* 2.4* 2.8*   No results for input(s): LIPASE, AMYLASE in the last 168 hours. Recent Labs  Lab 08/15/19 2020  AMMONIA 11    Cardiac Enzymes: No results for input(s): CKTOTAL, CKMB, CKMBINDEX, TROPONINI in the last 168 hours. BNP (last 3 results) Recent Labs    08/15/19 2044  BNP 83.1    ProBNP (last 3 results) No results for input(s): PROBNP in the last 8760 hours.    Studies: Ct Head  Wo Contrast  Result Date: 08/18/2019 CLINICAL DATA:  85 year old male with presumed fall. EXAM: CT HEAD WITHOUT CONTRAST CT CERVICAL SPINE WITHOUT CONTRAST TECHNIQUE: Multidetector CT imaging of the head and cervical spine was performed following the standard protocol without intravenous contrast. Multiplanar CT image reconstructions of the cervical spine were also generated. COMPARISON:  None. FINDINGS: CT HEAD FINDINGS Brain: There is mild age-related atrophy and moderate chronic microvascular ischemic changes. There is no acute intracranial hemorrhage. No mass effect or midline shift. No extra-axial fluid collection. Vascular: No hyperdense vessel or unexpected calcification. Skull: Normal. Negative for fracture or focal lesion. Sinuses/Orbits: Mild mucoperiosteal thickening of left frontal sinus and several ethmoid air cells. No air-fluid level. The remainder of the visualized paranasal sinuses and mastoid air cells are clear. Other: None CT CERVICAL SPINE FINDINGS Alignment: No acute subluxation. Grade 1 C4-C5 anterolisthesis. Skull base and vertebrae: No acute fracture Soft tissues and spinal canal: No prevertebral fluid or swelling. No visible canal hematoma. Disc levels: Multilevel degenerative changes with disc space narrowing and osteophyte. Multilevel facet arthropathy. Upper chest: Negative. Other: Mild carotid bulb calcified plaques. IMPRESSION: 1. No acute intracranial hemorrhage. Age-related atrophy and chronic microvascular ischemic changes. 2. No acute/traumatic cervical spine pathology. Multilevel degenerative changes. Electronically Signed   By: Anner Crete M.D.   On: 08/18/2019 08:19   Ct Cervical Spine Wo Contrast  Result Date: 08/18/2019 CLINICAL DATA:  85 year old male with presumed fall. EXAM: CT HEAD WITHOUT CONTRAST CT CERVICAL SPINE WITHOUT CONTRAST TECHNIQUE: Multidetector CT imaging of the head and cervical spine was performed following the standard protocol without  intravenous contrast. Multiplanar CT image reconstructions of the cervical spine were also generated. COMPARISON:  None. FINDINGS: CT HEAD FINDINGS  Brain: There is mild age-related atrophy and moderate chronic microvascular ischemic changes. There is no acute intracranial hemorrhage. No mass effect or midline shift. No extra-axial fluid collection. Vascular: No hyperdense vessel or unexpected calcification. Skull: Normal. Negative for fracture or focal lesion. Sinuses/Orbits: Mild mucoperiosteal thickening of left frontal sinus and several ethmoid air cells. No air-fluid level. The remainder of the visualized paranasal sinuses and mastoid air cells are clear. Other: None CT CERVICAL SPINE FINDINGS Alignment: No acute subluxation. Grade 1 C4-C5 anterolisthesis. Skull base and vertebrae: No acute fracture Soft tissues and spinal canal: No prevertebral fluid or swelling. No visible canal hematoma. Disc levels: Multilevel degenerative changes with disc space narrowing and osteophyte. Multilevel facet arthropathy. Upper chest: Negative. Other: Mild carotid bulb calcified plaques. IMPRESSION: 1. No acute intracranial hemorrhage. Age-related atrophy and chronic microvascular ischemic changes. 2. No acute/traumatic cervical spine pathology. Multilevel degenerative changes. Electronically Signed   By: Anner Crete M.D.   On: 08/18/2019 08:19   Mr Brain Wo Contrast  Result Date: 08/16/2019 CLINICAL DATA:  Encephalopathy. COVID-19 infection. EXAM: MRI HEAD WITHOUT CONTRAST TECHNIQUE: Multiplanar, multiecho pulse sequences of the brain and surrounding structures were obtained without intravenous contrast. COMPARISON:  None. FINDINGS: Brain: Diffusion-weighted images demonstrate no acute or subacute infarction. Hemorrhage or mass lesion present. Moderate generalized atrophy and confluent white matter disease is present bilaterally. Subcortical T2 hyperintensities are noted as well. The ventricles are proportionate to  the degree of atrophy. No significant extraaxial fluid collection is present. White matter changes extend into the brainstem. Cerebellum is unremarkable. Vascular: Flow is present in the major intracranial arteries. Skull and upper cervical spine: The craniocervical junction is normal. Upper cervical spine is within normal limits. Marrow signal is unremarkable. Sinuses/Orbits: The paranasal sinuses and mastoid air cells are clear. Bilateral lens replacements are noted. Globes and orbits are otherwise unremarkable. IMPRESSION: 1. No acute or focal abnormality to explain the patient's symptoms. 2. Moderate generalized atrophy and confluent white matter disease. This likely reflects the sequela of chronic microvascular ischemia. Electronically Signed   By: San Morelle M.D.   On: 08/16/2019 14:43     Admission status:Inpatient: Based on patients clinical presentation and evaluation of above clinical data, I have made determination that patient meets Inpatient criteria at this time.   [facility] Pager [phone]. If 7PM-7AM, please contact night-coverage at [email or web address], Office  [phone]  password [place]  08/18/2019, 1:33 PM  LOS: 3 days

## 2019-08-18 NOTE — Progress Notes (Signed)
Report given to [facility] at [facility].

## 2019-08-18 NOTE — Progress Notes (Signed)
OT Cancellation Note  Patient Details Name: Paul Terrell MRN: [id number] DOB: 05/19/1931   Cancelled Treatment:    Reason Eval/Treat Not Completed: Other (comment)(Pt transferred to [place]. Will follow up for acute OT at [place]. )  [place], OTR/L Acute Rehab Pager: [phone] Office: [phone] 08/18/2019, 2:42 PM

## 2019-08-18 NOTE — Plan of Care (Signed)
Patient confused and agitated

## 2019-08-18 NOTE — Plan of Care (Signed)
Patient very agitated, hanging legs out of bed, attempting to pull at tubes. 1 mg Ativan IV given.

## 2019-08-19 DIAGNOSIS — R748 Abnormal levels of other serum enzymes: Secondary | ICD-10-CM

## 2019-08-19 DIAGNOSIS — D649 Anemia, unspecified: Secondary | ICD-10-CM

## 2019-08-19 LAB — COMPREHENSIVE METABOLIC PANEL
ALT: 92 U/L — ABNORMAL HIGH (ref 0–44)
AST: 122 U/L — ABNORMAL HIGH (ref 15–41)
Albumin: 2.9 g/dL — ABNORMAL LOW (ref 3.5–5.0)
Alkaline Phosphatase: 60 U/L (ref 38–126)
Anion gap: 12 (ref 5–15)
BUN: 18 mg/dL (ref 8–23)
CO2: 23 mmol/L (ref 22–32)
Calcium: 8.7 mg/dL — ABNORMAL LOW (ref 8.9–10.3)
Chloride: 105 mmol/L (ref 98–111)
Creatinine, Ser: 0.73 mg/dL (ref 0.61–1.24)
GFR calc Af Amer: >60 mL/min (ref >60)
GFR calc non Af Amer: >60 mL/min (ref >60)
Glucose, Bld: 91 mg/dL (ref 70–99)
Potassium: 3.4 mmol/L — ABNORMAL LOW (ref 3.5–5.1)
Sodium: 140 mmol/L (ref 135–145)
Total Bilirubin: 0.8 mg/dL (ref 0.3–1.2)
Total Protein: 6.1 g/dL — ABNORMAL LOW (ref 6.5–8.1)

## 2019-08-19 LAB — GLUCOSE, CAPILLARY
Glucose-Capillary: 136 mg/dL — ABNORMAL HIGH (ref 70–99)
Glucose-Capillary: 141 mg/dL — ABNORMAL HIGH (ref 70–99)
Glucose-Capillary: 203 mg/dL — ABNORMAL HIGH (ref 70–99)
Glucose-Capillary: 211 mg/dL — ABNORMAL HIGH (ref 70–99)
Glucose-Capillary: 229 mg/dL — ABNORMAL HIGH (ref 70–99)
Glucose-Capillary: 92 mg/dL (ref 70–99)

## 2019-08-19 LAB — CBC WITH DIFFERENTIAL/PLATELET
Abs Immature Granulocytes: 0.07 10*3/uL (ref 0.00–0.07)
Basophils Absolute: 0 10*3/uL (ref 0.0–0.1)
Basophils Relative: 0 %
Eosinophils Absolute: 0 10*3/uL (ref 0.0–0.5)
Eosinophils Relative: 0 %
HCT: 37.8 % — ABNORMAL LOW (ref 39.0–52.0)
Hemoglobin: 12.8 g/dL — ABNORMAL LOW (ref 13.0–17.0)
Immature Granulocytes: 1 %
Lymphocytes Relative: 19 %
Lymphs Abs: 1.7 10*3/uL (ref 0.7–4.0)
MCH: 29.4 pg (ref 26.0–34.0)
MCHC: 33.9 g/dL (ref 30.0–36.0)
MCV: 86.9 fL (ref 80.0–100.0)
Monocytes Absolute: 0.9 10*3/uL (ref 0.1–1.0)
Monocytes Relative: 10 %
Neutro Abs: 6.3 10*3/uL (ref 1.7–7.7)
Neutrophils Relative %: 70 %
Platelets: 257 10*3/uL (ref 150–400)
RBC: 4.35 MIL/uL (ref 4.22–5.81)
RDW: 14.6 % (ref 11.5–15.5)
WBC: 8.9 10*3/uL (ref 4.0–10.5)
nRBC: 0 % (ref 0.0–0.2)

## 2019-08-19 LAB — PHOSPHORUS: Phosphorus: 2.1 mg/dL — ABNORMAL LOW (ref 2.5–4.6)

## 2019-08-19 LAB — MAGNESIUM: Magnesium: 1.8 mg/dL (ref 1.7–2.4)

## 2019-08-19 LAB — FERRITIN: Ferritin: 280 ng/mL (ref 24–336)

## 2019-08-19 LAB — D-DIMER, QUANTITATIVE: D-Dimer, Quant: 1.86 ug/mL-FEU — ABNORMAL HIGH (ref 0.00–0.50)

## 2019-08-19 LAB — C-REACTIVE PROTEIN: CRP: 6.3 mg/dL — ABNORMAL HIGH (ref <1.0)

## 2019-08-19 MED ORDER — HALOPERIDOL 0.5 MG PO TABS
1.0000 mg | ORAL_TABLET | ORAL | Status: DC | PRN
  Administered 2019-08-26 – 2019-08-28 (×3): 1 mg via ORAL
  Filled 2019-08-19: qty 1
  Filled 2019-08-19 (×3): qty 2
  Filled 2019-08-19 (×2): qty 1

## 2019-08-19 MED ORDER — POTASSIUM CHLORIDE 10 MEQ/100ML IV SOLN
10.0000 meq | INTRAVENOUS | Status: AC
  Administered 2019-08-19 (×4): 10 meq via INTRAVENOUS
  Filled 2019-08-19: qty 100

## 2019-08-19 MED ORDER — HALOPERIDOL LACTATE 5 MG/ML IJ SOLN
1.0000 mg | INTRAMUSCULAR | Status: DC | PRN
  Administered 2019-08-19 – 2019-08-25 (×3): 1 mg via INTRAMUSCULAR
  Filled 2019-08-19 (×5): qty 1

## 2019-08-19 MED ORDER — MAGNESIUM SULFATE 2 GM/50ML IV SOLN
2.0000 g | Freq: Once | INTRAVENOUS | Status: AC
  Administered 2019-08-19: 19:00:00 2 g via INTRAVENOUS
  Filled 2019-08-19: qty 50

## 2019-08-19 MED ORDER — QUETIAPINE FUMARATE 25 MG PO TABS: 25.0000 mg | ORAL_TABLET | Freq: Every day | ORAL | Status: DC

## 2019-08-19 MED ORDER — VITAMIN B-1 100 MG PO TABS
100.0000 mg | ORAL_TABLET | Freq: Every day | ORAL | Status: DC
  Administered 2019-08-19 – 2019-08-28 (×9): 100 mg via ORAL
  Filled 2019-08-19 (×10): qty 1

## 2019-08-19 NOTE — Plan of Care (Signed)
  Problem: Education: Goal: Knowledge of General Education information will improve Description: Including pain rating scale, medication(s)/side effects and non-pharmacologic comfort measures 08/19/2019 0728 by Camillia Herter, RN Outcome: Progressing 08/19/2019 0727 by Camillia Herter, RN Outcome: Progressing   Problem: Health Behavior/Discharge Planning: Goal: Ability to manage health-related needs will improve 08/19/2019 0728 by Camillia Herter, RN Outcome: Progressing 08/19/2019 0727 by Camillia Herter, RN Outcome: Progressing   Problem: Clinical Measurements: Goal: Ability to maintain clinical measurements within normal limits will improve 08/19/2019 0728 by Camillia Herter, RN Outcome: Progressing 08/19/2019 0727 by Camillia Herter, RN Outcome: Progressing Goal: Will remain free from infection 08/19/2019 0728 by Camillia Herter, RN Outcome: Progressing 08/19/2019 0727 by Camillia Herter, RN Outcome: Progressing Goal: Diagnostic test results will improve 08/19/2019 0728 by Camillia Herter, RN Outcome: Progressing 08/19/2019 0727 by Camillia Herter, RN Outcome: Progressing Goal: Respiratory complications will improve 08/19/2019 0728 by Camillia Herter, RN Outcome: Progressing 08/19/2019 0727 by Camillia Herter, RN Outcome: Progressing Goal: Cardiovascular complication will be avoided 08/19/2019 0728 by Camillia Herter, RN Outcome: Progressing 08/19/2019 0727 by Camillia Herter, RN Outcome: Progressing   Problem: Activity: Goal: Risk for activity intolerance will decrease 08/19/2019 0728 by Camillia Herter, RN Outcome: Progressing 08/19/2019 0727 by Camillia Herter, RN Outcome: Progressing   Problem: Nutrition: Goal: Adequate nutrition will be maintained 08/19/2019 0728 by Camillia Herter, RN Outcome: Progressing 08/19/2019 0727 by Camillia Herter, RN Outcome: Progressing   Problem: Coping: Goal: Level of anxiety will decrease 08/19/2019 0728 by Camillia Herter, RN Outcome:  Progressing 08/19/2019 0727 by Camillia Herter, RN Outcome: Progressing   Problem: Elimination: Goal: Will not experience complications related to bowel motility 08/19/2019 0728 by Camillia Herter, RN Outcome: Progressing 08/19/2019 0727 by Camillia Herter, RN Outcome: Progressing Goal: Will not experience complications related to urinary retention 08/19/2019 0728 by Camillia Herter, RN Outcome: Progressing 08/19/2019 0727 by Camillia Herter, RN Outcome: Progressing   Problem: Pain Managment: Goal: General experience of comfort will improve 08/19/2019 0728 by Camillia Herter, RN Outcome: Progressing 08/19/2019 0727 by Camillia Herter, RN Outcome: Progressing   Problem: Safety: Goal: Ability to remain free from injury will improve 08/19/2019 0728 by Camillia Herter, RN Outcome: Progressing 08/19/2019 0727 by Camillia Herter, RN Outcome: Progressing   Problem: Skin Integrity: Goal: Risk for impaired skin integrity will decrease 08/19/2019 0728 by Camillia Herter, RN Outcome: Progressing 08/19/2019 0727 by Camillia Herter, RN Outcome: Progressing

## 2019-08-19 NOTE — Progress Notes (Signed)
Facetimed with son and spouse with patient.

## 2019-08-19 NOTE — Progress Notes (Signed)
Updated daughter Elspeth Cho on patients condition.

## 2019-08-19 NOTE — Progress Notes (Signed)
PROGRESS NOTE    Paul Terrell  [id number] DOB: [birth year] DOA: 08/15/2019 PCP: Tonia Ghent, MD    Brief Narrative:  85 year old male who presented with altered mental status.  He does have significant past medical history for hypertension, type 2 diabetes mellitus, dyslipidemia, coronary artery disease, BPH and GERD.  Patient was noted to have several days of decreased p.o. intake, dry cough, generalized weakness, lethargy and difficulty walking.  He tested positive for COVID-19 November 21.  On the day of admission his symptoms were worse, he fell out of bed with no loss of consciousness.  On his initial physical examination he was febrile 102.5 F, blood pressure 165/85, pulse rate 118, respiratory 24-36, oxygen saturation 33%, his lungs are clear to auscultation, heart S1-S2 present rhythmic, abdomen soft, no lower extremity edema, positive confusion and decreased hearing. Sodium 132, potassium 3.6, chloride 97, bicarb 23, glucose 145, BUN 17, creatinine 1.1, AST 126, ALT 135 white count 4.9, hemoglobin 11.8, hematocrit 34.6, platelets 147.  SARS COVID-19 positive.  This 11-20 white cells, 100 protein, specific gravity 1.024.  CT head, cervical spine no acute changes.  His chest radiograph had a left lower lobe interstitial infiltrate (personally reviewed), EKG 100 bpm, normal axis, right bundle branch block, no ST segment changes, negative T waves V1 through V6.  Patient was admitted to the hospital with working diagnosis of SARS COVID-19 viral pneumonia.  Systemic inflammatory response syndrome with endorgan failure, metabolic encephalopathy, sepsis present on admission, due to viral pneumonia.  Patient developed progressive and worsening altered mental status, work-up with head CT, brain MRI and electroencephalography are consistent with metabolic diffuse encephalopathy.  Transferred to [facility] November 25.    Assessment & Plan:   Principal Problem:   Sepsis due to  COVID-19 [facility]) Active Problems:   Diabetes mellitus type II   Hyperlipidemia   CAD (coronary artery disease)   BPH (benign prostatic hyperplasia)   Anemia   Hypertension   Pneumonia due to COVID-19 virus   GERD (gastroesophageal reflux disease)   Thrombocytopenia (HCC)   Elevated liver enzymes   Acute metabolic encephalopathy   1.  Sepsis due to SARS COVID-19 viral pneumonia/present on admission.  RR: 18  Pulse oxymetry: 97%  Fi02: 21% room air.  COVID-19 Labs  Recent Labs    08/17/19 0522 08/18/19 0408 08/19/19 0016  DDIMER 1.60* 1.68* 1.86*  FERRITIN 746* 441* 280  CRP 14.1* 8.8* 6.3*    No results found for: SARSCOV2NAA  Inflammatory markers are trending down.  Patient has completed medical therapy with Remdesivir #5/5 and systemic corticosteroids. Severe encephalopathy and not able to cooperate with incentive spirometer or flutter valve. No significant cough.  AST 122 and ALT 92, stable elevation of liver enzymes. His ammonia on 11/22 was 11.   2. Metabolic encephalopathy. Apparently at baseline patient was independent and able to do his activities of daily living. This am patient continue to be very confused and altered. His worked up has been negative. Will hold on lorazepam for now and continue aspiration precautions. Likely encephalopathy due to COVID 19.  Patient on soft wrist restrains, will add as needed  Haldol for agitation. Add thiamine.   3. HTN. Continue blood pressure monitoring. On metoprolol 50 mg daily and low dose of lisinopril 2,5 mg daily.   4. T2DM/ dyslipidemia. Fasting glucose is 91 this am, will hodl on insulin sliding scale for now due to risk for hypoglycemia, patient with poor oral intake. Continue statin therapy with  pravastatin.   5, Hypokalemia and hypomagensemia. K this am is 3,4 with preserved renal function, will continue K correction with Kcl. Continue Mg correction with Mg sulfate.   DVT prophylaxis: enoxaparin   Code Status:   full Family Communication:  No family at the bedside  Disposition Plan/ discharge barriers: pending clinical improvement..   Body mass index is 26.34 kg/m. Malnutrition Type:      Malnutrition Characteristics:      Nutrition Interventions:     RN Pressure Injury Documentation:     Consultants:     Procedures:     Antimicrobials:       Subjective: Patient is not responsive or interactive, has been on restrains.   Objective: Vitals:   08/18/19 2200 08/19/19 0000 08/19/19 0400 08/19/19 0750  BP:   (!) 156/84 (!) 159/83  Pulse:  92 93 84  Resp: (!) 22 (!) 22 20 18   Temp:   97.6 F (36.4 C) 97.6 F (36.4 C)  TempSrc:   Axillary Axillary  SpO2:  99% 97% 97%  Weight:      Height:        Intake/Output Summary (Last 24 hours) at 08/19/2019 [id number] Last data filed at 08/19/2019 0425 Gross per 24 hour  Intake 50 ml  Output 1075 ml  Net -1025 ml   Filed Weights   08/18/19 1430  Weight: 69.6 kg    Examination:   General: very deconditioned and ill looking appearing  Neurology: somnolent and lethargic, opens his eyes to voice but not following any commands, not interactive.  E ENT: mild pallor, no icterus, oral mucosa dry. Cardiovascular: No JVD. S1-S2 present, rhythmic, no gallops, rubs, or murmurs. No lower extremity edema. Pulmonary: positive breath sounds bilaterally. Gastrointestinal. Abdomen with no organomegaly, non tender, no rebound or guarding Skin. No rashes Musculoskeletal: no joint deformities     Data Reviewed: I have personally reviewed following labs and imaging studies  CBC: Recent Labs  Lab 08/15/19 2044 08/16/19 1500 08/16/19 1509 08/17/19 0522 08/18/19 0408 08/19/19 0016  WBC 5.7 5.6  --  8.0 8.7 8.9  NEUTROABS  --  4.6  --  6.4 6.7 6.3  HGB 12.2* 11.1* 10.5* 11.4* 12.0* 12.8*  HCT 36.0* 32.8* 31.0* 33.0* 34.9* 37.8*  MCV 87.2 87.9  --  85.1 84.9 86.9  PLT 160 176  --  191 222 [id number]   Basic Metabolic Panel: Recent  Labs  Lab 08/15/19 1009 08/15/19 2044 08/16/19 1500 08/16/19 1509 08/17/19 0522 08/18/19 0408 08/19/19 0016  NA 132*  --  136 135 139 138 140  K 3.6  --  3.6 3.7 3.5 3.4* 3.4*  CL 97*  --  104  --  107 103 105  CO2 23  --  22  --  20* 22 23  GLUCOSE 145*  --  206*  --  184* 102* 91  BUN 17  --  25*  --  28* 19 18  CREATININE 1.17 1.06 0.92  --  0.87 0.79 0.73  CALCIUM 8.7*  --  8.3*  --  8.5* 8.6* 8.7*  MG  --   --  2.0  --  1.9 1.8 1.8  PHOS  --   --  3.7  --  2.4* 2.2* 2.1*   GFR: Estimated Creatinine Clearance: 55.5 mL/min (by C-G formula based on SCr of 0.73 mg/dL). Liver Function Tests: Recent Labs  Lab 08/15/19 1613 08/16/19 1500 08/17/19 0522 08/18/19 0408 08/19/19 0016  AST 126* 97* 66* 112* 122*  ALT 135* 105* 82* 91* 92*  ALKPHOS 55 48 47 54 60  BILITOT 1.5* 1.2 1.3* 1.0 0.8  PROT 6.7 6.0* 5.4* 6.0* 6.1*  ALBUMIN 3.1* 2.5* 2.4* 2.8* 2.9*   No results for input(s): LIPASE, AMYLASE in the last 168 hours. Recent Labs  Lab 08/15/19 2020  AMMONIA 11   Coagulation Profile: No results for input(s): INR, PROTIME in the last 168 hours. Cardiac Enzymes: No results for input(s): CKTOTAL, CKMB, CKMBINDEX, TROPONINI in the last 168 hours. BNP (last 3 results) No results for input(s): PROBNP in the last 8760 hours. HbA1C: No results for input(s): HGBA1C in the last 72 hours. CBG: Recent Labs  Lab 08/18/19 1628 08/18/19 1940 08/19/19 0004 08/19/19 0401 08/19/19 0755  GLUCAP 190* 154* 136* 92 141*   Lipid Profile: No results for input(s): CHOL, HDL, LDLCALC, TRIG, CHOLHDL, LDLDIRECT in the last 72 hours. Thyroid Function Tests: No results for input(s): TSH, T4TOTAL, FREET4, T3FREE, THYROIDAB in the last 72 hours. Anemia Panel: Recent Labs    08/16/19 1500  08/18/19 0408 08/19/19 0016  [id number] 3,736*  --   --   --   FOLATE 29.3  --   --   --   FERRITIN 685*   < > 441* 280   < > = values in this interval not displayed.      Radiology Studies:  I have reviewed all of the imaging during this hospital visit personally     Scheduled Meds: . enoxaparin (LOVENOX) injection  40 mg Subcutaneous Q24H  . finasteride  5 mg Oral Daily  . insulin aspart  0-15 Units Subcutaneous Q4H  . lisinopril  2.5 mg Oral Daily  . metoprolol succinate  50 mg Oral Daily  . multivitamin with minerals  1 tablet Oral Daily  . pantoprazole  40 mg Oral Daily  . pravastatin  20 mg Oral QHS  . tamsulosin  0.4 mg Oral QHS  . vitamin C  500 mg Oral Daily  . zinc sulfate  220 mg Oral Daily   Continuous Infusions: . remdesivir 100 mg in NS 250 mL 100 mg (08/18/19 2117)     LOS: 4 days        Katie Faraone Gerome Apley, MD

## 2019-08-20 DIAGNOSIS — K219 Gastro-esophageal reflux disease without esophagitis: Secondary | ICD-10-CM

## 2019-08-20 LAB — GLUCOSE, CAPILLARY
Glucose-Capillary: 218 mg/dL — ABNORMAL HIGH (ref 70–99)
Glucose-Capillary: 250 mg/dL — ABNORMAL HIGH (ref 70–99)
Glucose-Capillary: 272 mg/dL — ABNORMAL HIGH (ref 70–99)

## 2019-08-20 LAB — CULTURE, BLOOD (ROUTINE X 2)
Culture: NO GROWTH
Culture: NO GROWTH
Special Requests: ADEQUATE

## 2019-08-20 LAB — COMPREHENSIVE METABOLIC PANEL
ALT: 92 U/L — ABNORMAL HIGH (ref 0–44)
AST: 105 U/L — ABNORMAL HIGH (ref 15–41)
Albumin: 3.2 g/dL — ABNORMAL LOW (ref 3.5–5.0)
Alkaline Phosphatase: 72 U/L (ref 38–126)
Anion gap: 16 — ABNORMAL HIGH (ref 5–15)
BUN: 19 mg/dL (ref 8–23)
CO2: 19 mmol/L — ABNORMAL LOW (ref 22–32)
Calcium: 8.8 mg/dL — ABNORMAL LOW (ref 8.9–10.3)
Chloride: 102 mmol/L (ref 98–111)
Creatinine, Ser: 0.86 mg/dL (ref 0.61–1.24)
GFR calc Af Amer: >60 mL/min (ref >60)
GFR calc non Af Amer: >60 mL/min (ref >60)
Glucose, Bld: 226 mg/dL — ABNORMAL HIGH (ref 70–99)
Potassium: 3.8 mmol/L (ref 3.5–5.1)
Sodium: 137 mmol/L (ref 135–145)
Total Bilirubin: 1.2 mg/dL (ref 0.3–1.2)
Total Protein: 6.5 g/dL (ref 6.5–8.1)

## 2019-08-20 MED ORDER — DEXTROSE IN LACTATED RINGERS 5 % IV SOLN
INTRAVENOUS | Status: DC
  Administered 2019-08-20: via INTRAVENOUS

## 2019-08-20 MED ORDER — QUETIAPINE FUMARATE 25 MG PO TABS
50.0000 mg | ORAL_TABLET | Freq: Every day | ORAL | Status: DC
  Administered 2019-08-20 – 2019-08-27 (×9): 50 mg via ORAL
  Filled 2019-08-20 (×9): qty 2

## 2019-08-20 MED ORDER — INSULIN ASPART 100 UNIT/ML ~~LOC~~ SOLN
0.0000 [IU] | Freq: Three times a day (TID) | SUBCUTANEOUS | Status: DC
  Administered 2019-08-21: 7 [IU] via SUBCUTANEOUS
  Administered 2019-08-21 (×2): 5 [IU] via SUBCUTANEOUS
  Administered 2019-08-22 (×3): 3 [IU] via SUBCUTANEOUS
  Administered 2019-08-23 (×2): 2 [IU] via SUBCUTANEOUS
  Administered 2019-08-23 – 2019-08-24 (×2): 3 [IU] via SUBCUTANEOUS
  Administered 2019-08-24 (×2): 2 [IU] via SUBCUTANEOUS
  Administered 2019-08-25: 3 [IU] via SUBCUTANEOUS
  Administered 2019-08-25 – 2019-08-26 (×3): 2 [IU] via SUBCUTANEOUS
  Administered 2019-08-26 – 2019-08-27 (×3): 3 [IU] via SUBCUTANEOUS
  Administered 2019-08-27: 1 [IU] via SUBCUTANEOUS
  Administered 2019-08-27: 3 [IU] via SUBCUTANEOUS
  Administered 2019-08-28 (×2): 2 [IU] via SUBCUTANEOUS

## 2019-08-20 MED ORDER — RESOURCE THICKENUP CLEAR PO POWD
ORAL | Status: DC | PRN
  Filled 2019-08-20: qty 125

## 2019-08-20 NOTE — Plan of Care (Signed)
Patient confused/agitated, not following commands.

## 2019-08-20 NOTE — Progress Notes (Signed)
PROGRESS NOTE    Paul Terrell  [id number] DOB: 1931/03/30 DOA: 08/15/2019 PCP: Tonia Ghent, MD    Brief Narrative:  85 year old male who presented with altered mental status.  He does have significant past medical history for hypertension, type 2 diabetes mellitus, dyslipidemia, coronary artery disease, BPH and GERD.  Patient was noted to have several days of decreased p.o. intake, dry cough, generalized weakness, lethargy and difficulty walking.  He tested positive for COVID-19 November 21.  On the day of admission his symptoms were worse, he fell out of bed with no loss of consciousness.  On his initial physical examination he was febrile 102.5 F, blood pressure 165/85, pulse rate 118, respiratory 24-36, oxygen saturation 33%, his lungs are clear to auscultation, heart S1-S2 present rhythmic, abdomen soft, no lower extremity edema, positive confusion and decreased hearing. Sodium 132, potassium 3.6, chloride 97, bicarb 23, glucose 145, BUN 17, creatinine 1.1, AST 126, ALT 135 white count 4.9, hemoglobin 11.8, hematocrit 34.6, platelets 147.  SARS COVID-19 positive.  This 11-20 white cells, 100 protein, specific gravity 1.024.  CT head, cervical spine no acute changes.  His chest radiograph had a left lower lobe interstitial infiltrate (personally reviewed), EKG 100 bpm, normal axis, right bundle branch block, no ST segment changes, negative T waves V1 through V6.  Patient was admitted to the hospital with working diagnosis of SARS COVID-19 viral pneumonia.  Systemic inflammatory response syndrome with endorgan failure, metabolic encephalopathy, sepsis present on admission, due to viral pneumonia.  Patient developed progressive and worsening altered mental status, work-up with head CT, brain MRI and electroencephalography are consistent with metabolic diffuse encephalopathy.  Transferred to [place] November 25.     Assessment & Plan:   Principal Problem:   Sepsis  due to COVID-19 [facility]) Active Problems:   Diabetes mellitus type II   Hyperlipidemia   CAD (coronary artery disease)   BPH (benign prostatic hyperplasia)   Anemia   Hypertension   Pneumonia due to COVID-19 virus   GERD (gastroesophageal reflux disease)   Thrombocytopenia (HCC)   Elevated liver enzymes   Acute metabolic encephalopathy    1.  Sepsis due to SARS COVID-19 viral pneumonia/present on admission. Completed medical therapy with Remdesivir #5/5 and systemic corticosteroids  RR: 22  Pulse oxymetry: 98%  Fi02: 21% room air.  Encephalopathy as a sequelae from COVID 19 virus, will continue oxymetry monitoring and aspiration precautions.   Trending down liver enzymes AST 105 from 122 and ALT 92 from 92.  2. Metabolic encephalopathy with delirium. Apparently at baseline patient was independent and able to do his activities of daily living. Likely sequale from COVID 19. Will continue supportive medical therapy, avoid benzodiazepines, aspiration precautions (dyspheagia diet), as needed haldol, and neuro checks q 4 H.   Continue thiamine and multivitamins. Patient is in high risk for worsening encephalopathy.   Continue with quetiapine.   3. HTN. Blood pressure control with metoprolol and lisinopril.   4. T2DM/ dyslipidemia. Fasting glucose is 226 this am, capillary 218 and 250, will resume insulin sliding scale for glucose cover and monitoring. Patient with poor oral intake, now off systemic corticosteroids.  5, Hypokalemia and hypomagensemia. K is at 3,8 this am, will continue to follow on renal function and electrolytes. Continue hydration with IV dextrose and balanced electrolyte solutions.   DVT prophylaxis: enoxaparin   Code Status:  full Family Communication:  No family at the bedside  Disposition Plan/ discharge barriers: pending clinical improvement.Marland Kitchen  Body mass index is 26.34 kg/m. Malnutrition Type:      Malnutrition Characteristics:       Nutrition Interventions:     RN Pressure Injury Documentation:     Consultants:     Procedures:     Antimicrobials:       Subjective: Patient continue to be on bilateral mittens, opens his eyes to touch, not verbal or interactive.  Objective: Vitals:   08/19/19 2213 08/20/19 0031 08/20/19 0348 08/20/19 0805  BP:  (!) 152/98  (!) 150/97  Pulse:    (!) 102  Resp: (!) 22  20 (!) 22  Temp: (!) 97.4 F (36.3 C) 98.2 F (36.8 C) 97.8 F (36.6 C) (!) 96.9 F (36.1 C)  TempSrc: Axillary Axillary Axillary Axillary  SpO2:    98%  Weight:      Height:        Intake/Output Summary (Last 24 hours) at 08/20/2019 0850 Last data filed at 08/19/2019 1653 Gross per 24 hour  Intake 75 ml  Output 600 ml  Net -525 ml   Filed Weights   08/18/19 1430  Weight: 69.6 kg    Examination:   General: deconditioned and ill looking appearing  Neurology: Awake and alert, non focal  E ENT: mild pallor, no icterus, oral mucosa dry.  Cardiovascular: No JVD. S1-S2 present, rhythmic, no gallops, rubs, or murmurs. No lower extremity edema. Pulmonary: positive breath sounds bilaterally. Gastrointestinal. Abdomen with no organomegaly, non tender, no rebound or guarding Skin. No rashes Musculoskeletal: no joint deformities     Data Reviewed: I have personally reviewed following labs and imaging studies  CBC: Recent Labs  Lab 08/15/19 2044 08/16/19 1500 08/16/19 1509 08/17/19 0522 08/18/19 0408 08/19/19 0016  WBC 5.7 5.6  --  8.0 8.7 8.9  NEUTROABS  --  4.6  --  6.4 6.7 6.3  HGB 12.2* 11.1* 10.5* 11.4* 12.0* 12.8*  HCT 36.0* 32.8* 31.0* 33.0* 34.9* 37.8*  MCV 87.2 87.9  --  85.1 84.9 86.9  PLT 160 176  --  191 222 [id number]   Basic Metabolic Panel: Recent Labs  Lab 08/16/19 1500 08/16/19 1509 08/17/19 0522 08/18/19 0408 08/19/19 0016 08/20/19 0015  NA 136 135 139 138 140 137  K 3.6 3.7 3.5 3.4* 3.4* 3.8  CL 104  --  107 103 105 102  CO2 22  --  20* 22 23 19*  GLUCOSE  206*  --  184* 102* 91 226*  BUN 25*  --  28* 19 18 19   CREATININE 0.92  --  0.87 0.79 0.73 0.86  CALCIUM 8.3*  --  8.5* 8.6* 8.7* 8.8*  MG 2.0  --  1.9 1.8 1.8  --   PHOS 3.7  --  2.4* 2.2* 2.1*  --    GFR: Estimated Creatinine Clearance: 51.6 mL/min (by C-G formula based on SCr of 0.86 mg/dL). Liver Function Tests: Recent Labs  Lab 08/16/19 1500 08/17/19 0522 08/18/19 0408 08/19/19 0016 08/20/19 0015  AST 97* 66* 112* 122* 105*  ALT 105* 82* 91* 92* 92*  ALKPHOS 48 47 54 60 72  BILITOT 1.2 1.3* 1.0 0.8 1.2  PROT 6.0* 5.4* 6.0* 6.1* 6.5  ALBUMIN 2.5* 2.4* 2.8* 2.9* 3.2*   No results for input(s): LIPASE, AMYLASE in the last 168 hours. Recent Labs  Lab 08/15/19 2020  AMMONIA 11   Coagulation Profile: No results for input(s): INR, PROTIME in the last 168 hours. Cardiac Enzymes: No results for input(s): CKTOTAL, CKMB, CKMBINDEX, TROPONINI in the  last 168 hours. BNP (last 3 results) No results for input(s): PROBNP in the last 8760 hours. HbA1C: No results for input(s): HGBA1C in the last 72 hours. CBG: Recent Labs  Lab 08/19/19 0755 08/19/19 1137 08/19/19 1635 08/19/19 1938 08/20/19 0803  GLUCAP 141* 203* 211* 229* 218*   Lipid Profile: No results for input(s): CHOL, HDL, LDLCALC, TRIG, CHOLHDL, LDLDIRECT in the last 72 hours. Thyroid Function Tests: No results for input(s): TSH, T4TOTAL, FREET4, T3FREE, THYROIDAB in the last 72 hours. Anemia Panel: Recent Labs    08/18/19 0408 08/19/19 0016  FERRITIN 441* 280      Radiology Studies: I have reviewed all of the imaging during this hospital visit personally     Scheduled Meds: . enoxaparin (LOVENOX) injection  40 mg Subcutaneous Q24H  . finasteride  5 mg Oral Daily  . lisinopril  2.5 mg Oral Daily  . metoprolol succinate  50 mg Oral Daily  . multivitamin with minerals  1 tablet Oral Daily  . pantoprazole  40 mg Oral Daily  . pravastatin  20 mg Oral QHS  . QUEtiapine  50 mg Oral QHS  . tamsulosin   0.4 mg Oral QHS  . thiamine  100 mg Oral Daily  . vitamin C  500 mg Oral Daily  . zinc sulfate  220 mg Oral Daily   Continuous Infusions:   LOS: 5 days        Mauricio Gerome Apley, MD

## 2019-08-20 NOTE — Evaluation (Addendum)
Physical Therapy Evaluation Patient Details Name: Paul Terrell MRN: [id number] DOB: November 07, 1930 Today's Date: 08/20/2019   History of Present Illness  85 year old male who presented with altered mental status.  He does have significant past medical history for hypertension, type 2 diabetes mellitus, dyslipidemia, coronary artery disease, BPH and GERD.  Patient was noted to have several days of decreased p.o. intake, dry cough, generalized weakness, lethargy and difficulty walking.  He tested positive for COVID-19 November 21.  On the day of admission his symptoms were worse, he fell out of bed with no loss of consciousness. admitted to the hospital with working diagnosis of SARS COVID-19 viral pneumonia. Systemic inflammatory response syndrome with endorgan failure, metabolic encephalopathy, sepsis present on admission, due to viral pneumonia.  Clinical Impression   Pt admitted with above diagnosis. Pt is extremely confused, he is able to mumble but not sure what he is saying. As per chart he was very independent prior to hospitalization but since has been extremely confused and in restraints.  Pt currently with functional limitations due to the deficits listed below (see PT Problem List). Pt is a lot more alert today than previous day he is able to open eyes and minimally with max a and cues able to follow single steps instructions. He is highly impulsive attempting to stand and ambulate in room but he is also extremely weak and deconditioned needing mod-max a with all mobility. He was able to get to edge of bed with max assist, sits supported for some time, attempting to stand multi times, only successful with mod-max a and RW, able to take 2-3 steps from bed with max a and RW, and take 4 side steps at EOB to move up in bed w/ HHA and max cues. Pt will benefit from skilled PT to increase his overall strength, balance and coordination, activity tolerance, independence and safety with mobility to allow  discharge to the venue listed below.       Follow Up Recommendations SNF    Equipment Recommendations  None recommended by PT    Recommendations for Other Services       Precautions / Restrictions Precautions Precautions: Fall;Other (comment)(BUE restrained, torso restrained) Restrictions Weight Bearing Restrictions: No      Mobility  Bed Mobility Overal bed mobility: Needs Assistance Bed Mobility: Supine to Sit;Sit to Supine     Supine to sit: Max assist;Total assist Sit to supine: Max assist;Total assist   General bed mobility comments: throwss legs over edge but needs assist to get up into sitting position  Transfers Overall transfer level: Needs assistance   Transfers: Sit to/from Stand Sit to Stand: Mod assist;Max assist            Ambulation/Gait Ambulation/Gait assistance: Max assist Gait Distance (Feet): 3 Feet Assistive device: Rolling walker (2 wheeled) Gait Pattern/deviations: Wide base of support;Shuffle     General Gait Details: attempting to take steps at EOB minimally able to shuffle 2-3 steps from bed before strength gives out and unable to progress further.  Stairs            Wheelchair Mobility    Modified Rankin (Stroke Patients Only)       Balance Overall balance assessment: Needs assistance;History of Falls Sitting-balance support: Feet unsupported Sitting balance-Leahy Scale: Poor Sitting balance - Comments: able to sit EOB but needs assist to maintain and also to monitor as is very impulsive   Standing balance support: Bilateral upper extremity supported Standing balance-Leahy Scale: Poor  Pertinent Vitals/Pain Pain Assessment: Faces Faces Pain Scale: Hurts even more Pain Location: general(w/ movement supine<>sit) Pain Descriptors / Indicators: Grimacing;Moaning Pain Intervention(s): Limited activity within patient's tolerance    Home Living Family/patient expects to be  discharged to:: Unsure                 Additional Comments: unknow at this time chart reports has spouse and independent at home    Prior Function Level of Independence: Independent         Comments: chart states that pt was independent at home and frequently mowed the lawn, now is very confused and unable to give hx     Hand Dominance        Extremity/Trunk Assessment   Upper Extremity Assessment Upper Extremity Assessment: Defer to OT evaluation    Lower Extremity Assessment Lower Extremity Assessment: Generalized weakness       Communication   Communication: Receptive difficulties;Expressive difficulties(mumbles but not quite sure what he is saying)  Cognition Arousal/Alertness: Lethargic Behavior During Therapy: Agitated;Restless;Anxious;Impulsive Overall Cognitive Status: Impaired/Different from baseline Area of Impairment: Attention;Memory;Orientation;Following commands;Safety/judgement;Awareness;Problem solving                 Orientation Level: Disoriented to;Person;Place;Time;Situation Current Attention Level: Alternating Memory: Decreased recall of precautions;Decreased short-term memory Following Commands: Follows one step commands inconsistently Safety/Judgement: Decreased awareness of safety;Decreased awareness of deficits   Problem Solving: Slow processing;Difficulty sequencing;Requires verbal cues;Requires tactile cues General Comments: pt needs continued cues and redirection, is highly impulsive with no insight into deficits, attempts to get up and walk around even though he is not strong enough and unable to progress BLE as usual      General Comments      Exercises     Assessment/Plan    PT Assessment Patient needs continued PT services  PT Problem List Decreased strength;Decreased activity tolerance;Decreased balance;Decreased mobility;Decreased coordination;Decreased cognition;Decreased safety awareness       PT Treatment  Interventions DME instruction;Gait training;Functional mobility training;Therapeutic activities;Therapeutic exercise;Balance training;Neuromuscular re-education;Patient/family education    PT Goals (Current goals can be found in the Care Plan section)  Acute Rehab PT Goals Patient Stated Goal: pt unable to state goals at this time Time For Goal Achievement: 09/03/19 Potential to Achieve Goals: Fair    Frequency Min 2X/week   Barriers to discharge        Co-evaluation               AM-PAC PT "6 Clicks" Mobility  Outcome Measure Help needed turning from your back to your side while in a flat bed without using bedrails?: A Lot Help needed moving from lying on your back to sitting on the side of a flat bed without using bedrails?: A Lot Help needed moving to and from a bed to a chair (including a wheelchair)?: A Lot Help needed standing up from a chair using your arms (e.g., wheelchair or bedside chair)?: A Lot Help needed to walk in hospital room?: A Lot Help needed climbing 3-5 steps with a railing? : Total 6 Click Score: 11    End of Session     Patient left: in bed;with call bell/phone within reach;with bed alarm set Nurse Communication: Mobility status PT Visit Diagnosis: Unsteadiness on feet (R26.81);Other abnormalities of gait and mobility (R26.89);Muscle weakness (generalized) (M62.81)    Time: [id number] PT Time Calculation (min) (ACUTE ONLY): 22 min   Charges:   PT Evaluation $PT Eval High Complexity: 1 High PT Treatments $Therapeutic Activity: 8-22 mins  Horald Chestnut, PT   Delford Field 08/20/2019, 1:49 PM

## 2019-08-20 NOTE — Progress Notes (Signed)
Occupational Therapy Evaluation Patient Details Name: Paul Terrell MRN: [id number] DOB: Oct 07, 1930 Today's Date: 08/20/2019    History of Present Illness 85 year old male who presented with altered mental status.  He does have significant past medical history for hypertension, type 2 diabetes mellitus, dyslipidemia, coronary artery disease, BPH and GERD.  Patient was noted to have several days of decreased p.o. intake, dry cough, generalized weakness, lethargy and difficulty walking.  He tested positive for COVID-19 November 21.  On the day of admission his symptoms were worse, he fell out of bed with no loss of consciousness. admitted to the hospital with working diagnosis of SARS COVID-19 viral pneumonia. Systemic inflammatory response syndrome with endorgan failure, metabolic encephalopathy, sepsis present on admission, due to viral pneumonia.   Clinical Impression   Unable to obtain PLOF from pt as he is confused, unable to understand or answer questions clearly. Per chart, pt was independent prior to hospitalization. Pt currently requires max assist to total assist for all self-care and functional transfer tasks. Pt unable to follow simple one step instructions, demonstrating poor safety awareness throughout. Standing task not attempted this date due to safety concerns as pt was unable to follow simple one step instructions. Pt currently in bilateral wrist restraints as well as torso restraint. Pt's O2 SATs maintained in 90s throughout. Pt demonstrates decreased cognition, strength, endurance, balance, sitting tolerance, standing tolerance, activity tolerance, and safety awareness impacting ability to complete self-care and functional transfer tasks. Recommend skilled OT services to address above deficits in order to promote function and prevent further decline. Recommend SNF placement for continued rehab following hospital discharge.     Follow Up Recommendations  SNF    Equipment  Recommendations       Recommendations for Other Services       Precautions / Restrictions Precautions Precautions: Fall;Other (comment)(BUE wrist retraints. Torso restraint) Restrictions Weight Bearing Restrictions: No      Mobility Bed Mobility Overal bed mobility: Needs Assistance Bed Mobility: Supine to Sit;Sit to Supine     Supine to sit: Max assist Sit to supine: Total assist;+2 for physical assistance   General bed mobility comments: Unable to sequence or follow one step instructions  Transfers Overall transfer level: Needs assistance   Transfers: Sit to/from Stand Sit to Stand: Mod assist;Max assist         General transfer comment: Standing task not attempted this date due to safety concerns as pt was not able to follow simple one step instructions, noting impulsive behavior and poor safety awareness.    Balance Overall balance assessment: Needs assistance Sitting-balance support: Feet unsupported Sitting balance-Leahy Scale: Poor Sitting balance - Comments: able to sit EOB but needs assist to maintain and also to monitor as is very impulsive   Standing balance support: (Not attempted this date due to safety concerns) Standing balance-Leahy Scale: Poor                             ADL either performed or assessed with clinical judgement   ADL Overall ADL's : Needs assistance/impaired Eating/Feeding: Maximal assistance;Cueing for safety;Cueing for sequencing;Sitting   Grooming: Total assistance;Cueing for sequencing;Wash/dry face;Sitting(Provided hand over hand assist)   Upper Body Bathing: Total assistance;Sitting   Lower Body Bathing: Total assistance;Sitting/lateral leans;Sit to/from stand   Upper Body Dressing : Total assistance;Sitting   Lower Body Dressing: Total assistance;Sit to/from stand;Sitting/lateral leans   Toilet Transfer: +2 for physical assistance;+2 for safety/equipment;Moderate assistance;Stand-pivot   Toileting-  Clothing Manipulation and Hygiene: Maximal assistance;+2 for physical assistance;+2 for safety/equipment;Sitting/lateral lean;Sit to/from stand       Functional mobility during ADLs: Moderate assistance       Vision         Perception     Praxis      Pertinent Vitals/Pain Pain Assessment: Faces Pain Score: 5  Faces Pain Scale: Hurts little more Pain Location: Pt grimaces with bed mobility Pain Descriptors / Indicators: Grimacing;Moaning Pain Intervention(s): Limited activity within patient's tolerance     Hand Dominance     Extremity/Trunk Assessment Upper Extremity Assessment Upper Extremity Assessment: Generalized weakness(PROM BUE WFL. Pt resistive to movement. )   Lower Extremity Assessment Lower Extremity Assessment: Defer to PT evaluation       Communication Communication Communication: Receptive difficulties;Expressive difficulties(Unable to understand pt as he mumbles. )   Cognition Arousal/Alertness: Lethargic Behavior During Therapy: Agitated;Restless;Anxious;Impulsive Overall Cognitive Status: Impaired/Different from baseline Area of Impairment: Attention;Memory;Orientation;Following commands;Safety/judgement;Awareness;Problem solving                 Orientation Level: Disoriented to;Person;Place;Time;Situation Current Attention Level: Alternating Memory: Decreased recall of precautions;Decreased short-term memory Following Commands: Follows one step commands inconsistently Safety/Judgement: Decreased awareness of safety;Decreased awareness of deficits   Problem Solving: Slow processing;Difficulty sequencing;Requires verbal cues;Requires tactile cues General Comments: Pt is impulsive, unable to follow simple one step instructions. Pt attempted to get up and ambulate on several educations. Poor safety awareness.   General Comments  Pt tolerated sitting EOB ~10 min with min guard. Pt unable to complete simple face washing task requiring hand over  hand assist. Pt lethargic, unable to follow simple one step commands.     Exercises     Shoulder Instructions      Home Living Family/patient expects to be discharged to:: Unsure                                 Additional Comments: unknow at this time chart reports has spouse and independent at home      Prior Functioning/Environment Level of Independence: Independent        Comments: chart states that pt was independent at home and frequently mowed the lawn, now is very confused and unable to give hx        OT Problem List: Decreased strength;Decreased activity tolerance;Impaired balance (sitting and/or standing);Decreased cognition;Decreased safety awareness;Decreased knowledge of use of DME or AE;Decreased knowledge of precautions;Cardiopulmonary status limiting activity;Pain      OT Treatment/Interventions: Self-care/ADL training;Therapeutic exercise;Neuromuscular education;DME and/or AE instruction;Energy conservation;Therapeutic activities;Cognitive remediation/compensation;Patient/family education;Balance training    OT Goals(Current goals can be found in the care plan section) Acute Rehab OT Goals Patient Stated Goal: pt unable to state goals at this time Time For Goal Achievement: 09/03/19 Potential to Achieve Goals: Fair ADL Goals Pt Will Perform Eating: with set-up;sitting Pt Will Perform Grooming: with set-up;sitting Pt Will Transfer to Toilet: with min assist;bedside commode Pt Will Perform Toileting - Clothing Manipulation and hygiene: with min assist;sitting/lateral leans;sit to/from stand Additional ADL Goal #1: Pt to tolerate sitting EOB 10 min with supervision, in preparation for ADLs and transfers.  OT Frequency: Min 2X/week   Barriers to D/C:            Co-evaluation              AM-PAC OT "6 Clicks" Daily Activity     Outcome Measure Help from another person eating meals?: Total Help  from another person taking care of personal  grooming?: Total Help from another person toileting, which includes using toliet, bedpan, or urinal?: Total Help from another person bathing (including washing, rinsing, drying)?: Total Help from another person to put on and taking off regular upper body clothing?: Total Help from another person to put on and taking off regular lower body clothing?: Total 6 Click Score: 6   End of Session Nurse Communication: Mobility status  Activity Tolerance: Patient limited by fatigue;Patient limited by lethargy;Patient limited by pain(Limited by confusion) Patient left: in bed;with call bell/phone within reach;with nursing/sitter in room  OT Visit Diagnosis: Unsteadiness on feet (R26.81);Muscle weakness (generalized) (M62.81)                Time: [id number] OT Time Calculation (min): 20 min Charges:  OT General Charges $OT Visit: 1 Visit OT Evaluation $OT Eval Moderate Complexity: 1 [address] OTR/L [phone]   Mauri Brooklyn 08/20/2019, 3:37 PM

## 2019-08-20 NOTE — Evaluation (Signed)
Clinical/Bedside Swallow Evaluation Patient Details  Name: Paul Terrell MRN: [id number] Date of Birth: October 02, 1930  Today's Date: 08/20/2019 Time: SLP Start Time (ACUTE ONLY): 71 SLP Stop Time (ACUTE ONLY): 1100 SLP Time Calculation (min) (ACUTE ONLY): 20 min  Past Medical History:  Past Medical History:  Diagnosis Date  . Arthritis   . Basal cell carcinoma of scalp 12/16/07   Reexcision (Dr. Merril Abbe. Teressa Senter)  . BPH (benign prostatic hyperplasia)   . CAD (coronary artery disease) 11/01   stent placed  . Cataract   . Diabetes mellitus type II 11/01  . GERD (gastroesophageal reflux disease)    occasionally  . Hearing aid worn   . HOH (hard of hearing)   . Hyperlipemia 11/01  . Hypertension 11/01  . Ruptured disc, cervical    Neck C7 (Dr. Pearlie Oyster)   Past Surgical History:  Past Surgical History:  Procedure Laterality Date  . BASAL CELL CARCINOMA EXCISION    . BRONCHIAL BRUSHINGS  05/16/2018   Procedure: ESOPHAGEAL BRUSHINGS;  Surgeon: Irving Copas., MD;  Location: [place];  Service: Gastroenterology;;  . Lillard Anes  02/2000   Dr. Janus Molder - Right  . CERVICAL FUSION    . COLONOSCOPY     2012  . COLONOSCOPY N/A 05/17/2018   Procedure: COLONOSCOPY;  Surgeon: Mansouraty, Telford Nab., MD;  Location: [place];  Service: Gastroenterology;  Laterality: N/A;  . ESOPHAGOGASTRODUODENOSCOPY (EGD) WITH PROPOFOL N/A 05/16/2018   Procedure: ESOPHAGOGASTRODUODENOSCOPY (EGD) WITH PROPOFOL ;  Surgeon: Rush Landmark Telford Nab., MD;  Location: [place];  Service: Gastroenterology;  Laterality: N/A;  . HOT HEMOSTASIS N/A 05/16/2018   Procedure: HOT HEMOSTASIS (ARGON PLASMA COAGULATION/BICAP);  Surgeon: Irving Copas., MD;  Location: [place];  Service: Gastroenterology;  Laterality: N/A;  . KNEE ARTHROSCOPY  09/1999   Right  . LACERATION REPAIR  09/1999   Right Hand (Dr. Fredna Dow)  . LITHOTRIPSY  1991   Dr. Elyse Jarvis  . POLYPECTOMY  05/17/2018   Procedure: POLYPECTOMY;  Surgeon: Mansouraty, Telford Nab., MD;  Location: [place];  Service: Gastroenterology;;  . Cherre Robins CUFF REPAIR  02/05   Left   HPI:  85 y.o. male with medical history significant of hypertension, hyperlipidemia, diabetes, coronary artery disease status post stents, BPH, GERD, HOH presents to emergency department due to increased confusion over several days, Dx pna, sepsis, COVID +, metabolic encephalopathy. Pt evaluated on 11/24 by SLP, swallow WNL, but mental status has deteriorated since then and MD requested reeval.    Assessment / Plan / Recommendation Clinical Impression  Pts function is dramatically worsened from eval just 4 days ago. Pt is restless and encephalopathic, cannot sustain upright posture in bed. Lingual mucosa is severely dry, Pt is panting but O2 sats are in the 90s. He does still accept PO and oral phase is strong and rapid, however there are new signs of pharyngeal dysphagia. Pt hase multiple swallows and weak cough after sips of thin liquids. Similiar function with thickened liquids and puree, but no coughing occurs. Suspect that due to pts mental status he has not been requesting PO and has not been offered fluids. Dry mucosa, likelky to the pharynx is probably a mian contribuitng factor to new difficulty swallowing. Pt needs to be offered at least small amounts of PO frequently to maintain swallowing ability. He does now need a modified texture to decrease aspiration risk. Pt would benefit from objective testing, but he would not tolerate it given physical restlessness. Will trial puree and honey thick liquids and f/u for  potential advancement.  SLP Visit Diagnosis: Dysphagia, oropharyngeal phase (R13.12)    Aspiration Risk  Moderate aspiration risk;Risk for inadequate nutrition/hydration    Diet Recommendation Dysphagia 1 (Puree);Honey-thick liquid   Liquid Administration via: Spoon Medication Administration: Crushed with puree Supervision:  Staff to assist with self feeding;Full supervision/cueing for compensatory strategies Compensations: Slow rate;Small sips/bites Postural Changes: Seated upright at 90 degrees    Other  Recommendations Oral Care Recommendations: Oral care QID   Follow up Recommendations Skilled Nursing facility      Frequency and Duration min 2x/week  2 weeks       Prognosis Prognosis for Safe Diet Advancement: Fair Barriers to Reach Goals: Severity of deficits;Cognitive deficits      Swallow Study   General HPI: 85 y.o. male with medical history significant of hypertension, hyperlipidemia, diabetes, coronary artery disease status post stents, BPH, GERD, HOH presents to emergency department due to increased confusion over several days, Dx pna, sepsis, COVID +, metabolic encephalopathy. Pt evaluated on 11/24 by SLP, swallow WNL, but mental status has deteriorated since then and MD requested reeval.  Type of Study: Bedside Swallow Evaluation Previous Swallow Assessment: yes Diet Prior to this Study: Regular;Thin liquids Temperature Spikes Noted: No Respiratory Status: Nasal cannula History of Recent Intubation: No Behavior/Cognition: Alert;Confused;Distractible;Doesn't follow directions Oral Cavity Assessment: Dry Oral Care Completed by SLP: Yes Oral Cavity - Dentition: Edentulous(top denture removed) Self-Feeding Abilities: Total assist Patient Positioning: Postural control interferes with function Baseline Vocal Quality: Normal Volitional Cough: Cognitively unable to elicit Volitional Swallow: Unable to elicit    Oral/Motor/Sensory Function Overall Oral Motor/Sensory Function: Within functional limits   Ice Chips Ice chips: Within functional limits   Thin Liquid Thin Liquid: Impaired Presentation: Straw Pharyngeal  Phase Impairments: Multiple swallows;Wet Vocal Quality;Cough - Immediate    Nectar Thick Nectar Thick Liquid: Not tested   Honey Thick Honey Thick Liquid: Impaired Presentation:  Spoon Pharyngeal Phase Impairments: Multiple swallows   Puree Puree: Impaired Pharyngeal Phase Impairments: Multiple swallows   Solid     Solid: Not tested     Herbie [place], MA CCC-SLP  Acute Rehabilitation Services Pager [phone] Office [phone]  Lynann Beaver 08/20/2019,12:01 PM

## 2019-08-21 DIAGNOSIS — D696 Thrombocytopenia, unspecified: Secondary | ICD-10-CM

## 2019-08-21 DIAGNOSIS — E782 Mixed hyperlipidemia: Secondary | ICD-10-CM

## 2019-08-21 DIAGNOSIS — I25118 Atherosclerotic heart disease of native coronary artery with other forms of angina pectoris: Secondary | ICD-10-CM

## 2019-08-21 LAB — COMPREHENSIVE METABOLIC PANEL
ALT: 74 U/L — ABNORMAL HIGH (ref 0–44)
AST: 58 U/L — ABNORMAL HIGH (ref 15–41)
Albumin: 3 g/dL — ABNORMAL LOW (ref 3.5–5.0)
Alkaline Phosphatase: 69 U/L (ref 38–126)
Anion gap: 12 (ref 5–15)
BUN: 19 mg/dL (ref 8–23)
CO2: 23 mmol/L (ref 22–32)
Calcium: 8.5 mg/dL — ABNORMAL LOW (ref 8.9–10.3)
Chloride: 103 mmol/L (ref 98–111)
Creatinine, Ser: 0.78 mg/dL (ref 0.61–1.24)
GFR calc Af Amer: >60 mL/min (ref >60)
GFR calc non Af Amer: >60 mL/min (ref >60)
Glucose, Bld: 282 mg/dL — ABNORMAL HIGH (ref 70–99)
Potassium: 3.4 mmol/L — ABNORMAL LOW (ref 3.5–5.1)
Sodium: 138 mmol/L (ref 135–145)
Total Bilirubin: 1.3 mg/dL — ABNORMAL HIGH (ref 0.3–1.2)
Total Protein: 6.2 g/dL — ABNORMAL LOW (ref 6.5–8.1)

## 2019-08-21 LAB — GLUCOSE, CAPILLARY
Glucose-Capillary: 279 mg/dL — ABNORMAL HIGH (ref 70–99)
Glucose-Capillary: 298 mg/dL — ABNORMAL HIGH (ref 70–99)
Glucose-Capillary: 315 mg/dL — ABNORMAL HIGH (ref 70–99)
Glucose-Capillary: 259 mg/dL — ABNORMAL HIGH (ref 70–99)
Glucose-Capillary: 133 mg/dL — ABNORMAL HIGH (ref 70–99)

## 2019-08-21 MED ORDER — INSULIN DETEMIR 100 UNIT/ML ~~LOC~~ SOLN: 5.0000 [IU] | Freq: Every day | SUBCUTANEOUS | Status: DC

## 2019-08-21 MED ORDER — LACTATED RINGERS IV SOLN
INTRAVENOUS | Status: DC
  Administered 2019-08-21 – 2019-08-22 (×2): via INTRAVENOUS

## 2019-08-21 MED ORDER — LABETALOL HCL 5 MG/ML IV SOLN
5.0000 mg | Freq: Four times a day (QID) | INTRAVENOUS | Status: DC | PRN
  Administered 2019-08-22: 5 mg via INTRAVENOUS
  Filled 2019-08-21: qty 4

## 2019-08-21 NOTE — Progress Notes (Signed)
Physical Therapy Treatment Patient Details Name: Paul Terrell MRN: [id number] DOB: Jan 29, 1931 Today's Date: 08/21/2019    History of Present Illness 85 year old male who presented with altered mental status.  He does have significant past medical history for hypertension, type 2 diabetes mellitus, dyslipidemia, coronary artery disease, BPH and GERD.  Patient was noted to have several days of decreased p.o. intake, dry cough, generalized weakness, lethargy and difficulty walking.  He tested positive for COVID-19 November 21.  On the day of admission his symptoms were worse, he fell out of bed with no loss of consciousness. admitted to the hospital with working diagnosis of SARS COVID-19 viral pneumonia. Systemic inflammatory response syndrome with endorgan failure, metabolic encephalopathy, sepsis present on admission, due to viral pneumonia.    PT Comments    Pt making some progress with mobility, was calmer this am and minimally able to follow one step instructions. Still needing max a with all functional mobility and not able to tolerate standing very long but was able to sit EOB unsupported x approx 58mins. Have noted strange positioning in bed and also in sitting, not sure if this was there at baseline but when corrected pt returns to this same awkward twisted positioning. Pt was on room air throughout session and 02 sats remained in 90s, HR did increase to max 140s during tx with mobility and initial sitting EOB. Will continue to follow acutely and also progress as pt tolerates.     Follow Up Recommendations  SNF     Equipment Recommendations  None recommended by PT    Recommendations for Other Services       Precautions / Restrictions Precautions Precautions: Fall;Other (comment)(restrains BUE and torso) Restrictions Weight Bearing Restrictions: No    Mobility  Bed Mobility Overal bed mobility: Needs Assistance Bed Mobility: Supine to Sit;Sit to Supine     Supine to sit:  Max assist Sit to supine: Max assist      Transfers Overall transfer level: Needs assistance   Transfers: Sit to/from Stand Sit to Stand: Max assist            Ambulation/Gait             General Gait Details: did not ambulate this session   Stairs             Wheelchair Mobility    Modified Rankin (Stroke Patients Only)       Balance Overall balance assessment: Needs assistance Sitting-balance support: Feet unsupported Sitting balance-Leahy Scale: Fair Sitting balance - Comments: sat EOB unsupported approx 29mins     Standing balance-Leahy Scale: Poor                              Cognition Arousal/Alertness: Lethargic Behavior During Therapy: Agitated;Restless;Anxious;Impulsive Overall Cognitive Status: Impaired/Different from baseline Area of Impairment: Attention;Memory;Orientation;Following commands;Safety/judgement;Awareness;Problem solving                 Orientation Level: Disoriented to;Person;Place;Time;Situation Current Attention Level: Alternating Memory: Decreased recall of precautions;Decreased short-term memory Following Commands: Follows one step commands inconsistently Safety/Judgement: Decreased awareness of safety;Decreased awareness of deficits   Problem Solving: Slow processing;Difficulty sequencing;Requires verbal cues;Requires tactile cues General Comments: pt sat EOB in awkward position attempted to stand a few times but more calm today than previous session      Exercises      General Comments        Pertinent Vitals/Pain Pain Assessment: Faces Faces Pain Scale:  Hurts even more Pain Location: Pt grimaces with bed mobility Pain Descriptors / Indicators: Grimacing;Moaning Pain Intervention(s): Limited activity within patient's tolerance    Home Living                      Prior Function            PT Goals (current goals can now be found in the care plan section) Acute Rehab PT  Goals Patient Stated Goal: unable to state goals at this time Time For Goal Achievement: 09/03/19 Potential to Achieve Goals: Fair Progress towards PT goals: Progressing toward goals    Frequency    Min 2X/week      PT Plan Current plan remains appropriate    Co-evaluation              AM-PAC PT "6 Clicks" Mobility   Outcome Measure  Help needed turning from your back to your side while in a flat bed without using bedrails?: A Lot Help needed moving from lying on your back to sitting on the side of a flat bed without using bedrails?: A Lot Help needed moving to and from a bed to a chair (including a wheelchair)?: A Lot Help needed standing up from a chair using your arms (e.g., wheelchair or bedside chair)?: A Lot Help needed to walk in hospital room?: A Lot Help needed climbing 3-5 steps with a railing? : Total 6 Click Score: 11    End of Session   Activity Tolerance: Treatment limited secondary to medical complications (Comment);Patient limited by fatigue;Patient limited by lethargy Patient left: in bed;with call bell/phone within reach;with bed alarm set;with restraints reapplied Nurse Communication: Mobility status PT Visit Diagnosis: Unsteadiness on feet (R26.81);Other abnormalities of gait and mobility (R26.89);Muscle weakness (generalized) (M62.81)     Time: [id number] PT Time Calculation (min) (ACUTE ONLY): 31 min  Charges:  $Therapeutic Activity: 23-37 mins                     Paul Terrell, PT    Paul Terrell 08/21/2019, 10:11 AM

## 2019-08-21 NOTE — Progress Notes (Signed)
Attending physician notified related to staff nurses concern for waist restraint and bilateral mitts. Staff nurse asked if an order is needed for restraints in order to be compliant with [facility]

## 2019-08-21 NOTE — Progress Notes (Signed)
PROGRESS NOTE    Paul Terrell  [id number] DOB: 1930-11-24 DOA: 08/15/2019 PCP: Tonia Ghent, MD    Brief Narrative:  85 year old male who presented with altered mental status. He does have significant past medical history for hypertension, type 2 diabetes mellitus, dyslipidemia, coronary artery disease, BPH and GERD. Patient was noted to have several days of decreased p.o. intake, dry cough, generalized weakness, lethargy and difficulty walking. He tested positive for COVID-19 November 21. On the day of admission his symptoms were worse, he fell out of bed with no loss of consciousness. On his initial physical examination he was febrile 102.5 F,blood pressure 165/85, pulse rate 118, respiratory 24-36, oxygen saturation 33%, his lungs are clear to auscultation, heart S1-S2 present rhythmic, abdomen soft, no lower extremity edema, positive confusionanddecreased hearing. Sodium 132, potassium 3.6, chloride 97, bicarb 23, glucose 145, BUN 17, creatinine 1.1, AST 126, ALT 135 white count 4.9, hemoglobin 11.8, hematocrit 34.6, platelets 147.SARS COVID-19 positive. This 11-20 white cells, 100 protein, specific gravity 1.024. CT head, cervical spine no acute changes. His chest radiograph had a left lower lobe interstitial infiltrate (personally reviewed),EKG 100 bpm, normal axis, right bundle branch block, no ST segment changes, negative T waves V1 through V6.  Patient was admitted to the hospital with working diagnosis of SARS COVID-19 viral pneumonia.  Systemic inflammatory response syndrome with endorgan failure, metabolic encephalopathy, sepsis present on admission,due to viral pneumonia.  Patient developed progressive and worsening altered mental status, work-up with head CT, brain MRI and electroencephalography are consistent with metabolic diffuse encephalopathy.  Transferred to [place] November 25.  Patient has remained encephalopathic.    Assessment &  Plan:   Principal Problem:   Sepsis due to COVID-19 [facility]) Active Problems:   Diabetes mellitus type II   Hyperlipidemia   CAD (coronary artery disease)   BPH (benign prostatic hyperplasia)   Anemia   Hypertension   Pneumonia due to COVID-19 virus   GERD (gastroesophageal reflux disease)   Thrombocytopenia (HCC)   Elevated liver enzymes   Acute metabolic encephalopathy   1.Sepsis due to SARS COVID-19 viral pneumonia/present on admission. Completed medical therapy with Remdesivir #5/5 and systemic corticosteroids. He has no further requirements of supplemental oxygen, with 02 saturation 99% on room air.  He continues to have encephalopathy as a sequelae from COVID 19 virus. Continue aspiration precautions.    Trending down liver enzymes AST 58  and ALT 74.  2. Metabolic encephalopathy with delirium. Apparently at baseline patient was independent and able to do his activities of daily living. Likely sequale from COVID 19.   Patient this am more reactive, but continue to require restrains to protect lines and avoid self injury. On bilateral wrist restrains, waist belt and bedside rails.    Has not required haldol, continue with thiamine and multivitamins. On quetiapine.   Patient continue to have a high risk for worsening encephalopathy.   3. HTN. Blood pressure [id number] and [id number] systolic, will continue with metoprolol and lisinopril, for blood pressure control.   4. T2DM/ dyslipidemia. Fasting glucose is 282 this am, capillary 279, 298 and 315. Continue insulin sliding scale. Patient now off steroids and has poor oral intake. Will dc IV dextrose for now, if persistent hyperglycemia will consider basal insulin.   5, Hypokalemia and hypomagensemia. K is at 3,4 this am, renal function stable, will continue hydration with balanced electrolyte solutions, follow on renal panel in am.   DVT prophylaxis:enoxaparin Code Status:full Family Communication:No family at the bedside  Disposition Plan/ discharge barriers:pending clinical improvement..    Body mass index is 26.34 kg/m. Malnutrition Type:      Malnutrition Characteristics:      Nutrition Interventions:     RN Pressure Injury Documentation:     Consultants:     Procedures:     Antimicrobials:       Subjective: Patient this am continue to be confuse, he is more reactive but not fully interactive. Allowed to eat per speech therapy. No apparent dyspnea or chest pain.   Objective: Vitals:   08/20/19 2000 08/21/19 0138 08/21/19 0542 08/21/19 0747  BP:  (!) 163/92 131/77 (!) 145/80  Pulse:  (!) 108 100 (!) 105  Resp:    19  Temp: 98.8 F (37.1 C) 98.7 F (37.1 C) 99.3 F (37.4 C) (!) 97.4 F (36.3 C)  TempSrc: Oral Oral Axillary Axillary  SpO2:    98%  Weight:      Height:        Intake/Output Summary (Last 24 hours) at 08/21/2019 0845 Last data filed at 08/21/2019 0549 Gross per 24 hour  Intake 313.1 ml  Output 250 ml  Net 63.1 ml   Filed Weights   08/18/19 1430  Weight: 69.6 kg    Examination:   General: deconditioned and ill looking appearing  Neurology: awake and more reactive, but not interactive not following commands.  E ENT: mild pallor, no icterus, oral mucosa moist Cardiovascular: No JVD. S1-S2 present, rhythmic, no gallops, rubs, or murmurs. No lower extremity edema. Pulmonary: positive breath sounds bilaterally. Gastrointestinal. Abdomen with no organomegaly, non tender, no rebound or guarding Skin. No rashes Musculoskeletal: no joint deformities     Data Reviewed: I have personally reviewed following labs and imaging studies  CBC: Recent Labs  Lab 08/15/19 2044 08/16/19 1500 08/16/19 1509 08/17/19 0522 08/18/19 0408 08/19/19 0016  WBC 5.7 5.6  --  8.0 8.7 8.9  NEUTROABS  --  4.6  --  6.4 6.7 6.3  HGB 12.2* 11.1* 10.5* 11.4* 12.0* 12.8*  HCT 36.0* 32.8* 31.0* 33.0* 34.9* 37.8*  MCV 87.2 87.9  --  85.1 84.9 86.9  PLT 160 176   --  191 222 [id number]   Basic Metabolic Panel: Recent Labs  Lab 08/16/19 1500  08/17/19 0522 08/18/19 0408 08/19/19 0016 08/20/19 0015 08/21/19 0058  NA 136   < > 139 138 140 137 138  K 3.6   < > 3.5 3.4* 3.4* 3.8 3.4*  CL 104  --  107 103 105 102 103  CO2 22  --  20* 22 23 19* 23  GLUCOSE 206*  --  184* 102* 91 226* 282*  BUN 25*  --  28* 19 18 19 19   CREATININE 0.92  --  0.87 0.79 0.73 0.86 0.78  CALCIUM 8.3*  --  8.5* 8.6* 8.7* 8.8* 8.5*  MG 2.0  --  1.9 1.8 1.8  --   --   PHOS 3.7  --  2.4* 2.2* 2.1*  --   --    < > = values in this interval not displayed.   GFR: Estimated Creatinine Clearance: 55.5 mL/min (by C-G formula based on SCr of 0.78 mg/dL). Liver Function Tests: Recent Labs  Lab 08/17/19 0522 08/18/19 0408 08/19/19 0016 08/20/19 0015 08/21/19 0058  AST 66* 112* 122* 105* 58*  ALT 82* 91* 92* 92* 74*  ALKPHOS 47 54 60 72 69  BILITOT 1.3* 1.0 0.8 1.2 1.3*  PROT 5.4* 6.0* 6.1* 6.5 6.2*  ALBUMIN  2.4* 2.8* 2.9* 3.2* 3.0*   No results for input(s): LIPASE, AMYLASE in the last 168 hours. Recent Labs  Lab 08/15/19 2020  AMMONIA 11   Coagulation Profile: No results for input(s): INR, PROTIME in the last 168 hours. Cardiac Enzymes: No results for input(s): CKTOTAL, CKMB, CKMBINDEX, TROPONINI in the last 168 hours. BNP (last 3 results) No results for input(s): PROBNP in the last 8760 hours. HbA1C: No results for input(s): HGBA1C in the last 72 hours. CBG: Recent Labs  Lab 08/20/19 0803 08/20/19 1247 08/20/19 1611 08/21/19 0148 08/21/19 0746  GLUCAP 218* 250* 272* 279* 298*   Lipid Profile: No results for input(s): CHOL, HDL, LDLCALC, TRIG, CHOLHDL, LDLDIRECT in the last 72 hours. Thyroid Function Tests: No results for input(s): TSH, T4TOTAL, FREET4, T3FREE, THYROIDAB in the last 72 hours. Anemia Panel: Recent Labs    08/19/19 0016  FERRITIN 280      Radiology Studies: I have reviewed all of the imaging during this hospital visit personally      Scheduled Meds: . enoxaparin (LOVENOX) injection  40 mg Subcutaneous Q24H  . finasteride  5 mg Oral Daily  . insulin aspart  0-9 Units Subcutaneous TID WC  . lisinopril  2.5 mg Oral Daily  . metoprolol succinate  50 mg Oral Daily  . multivitamin with minerals  1 tablet Oral Daily  . pantoprazole  40 mg Oral Daily  . pravastatin  20 mg Oral QHS  . QUEtiapine  50 mg Oral QHS  . tamsulosin  0.4 mg Oral QHS  . thiamine  100 mg Oral Daily  . vitamin C  500 mg Oral Daily  . zinc sulfate  220 mg Oral Daily   Continuous Infusions: . dextrose 5% lactated ringers 50 mL/hr at 08/20/19 2335     LOS: 6 days        Mylisa Brunson Gerome Apley, MD

## 2019-08-22 DIAGNOSIS — J1289 Other viral pneumonia: Secondary | ICD-10-CM

## 2019-08-22 DIAGNOSIS — E119 Type 2 diabetes mellitus without complications: Secondary | ICD-10-CM

## 2019-08-22 DIAGNOSIS — I10 Essential (primary) hypertension: Secondary | ICD-10-CM

## 2019-08-22 LAB — COMPREHENSIVE METABOLIC PANEL
ALT: 71 U/L — ABNORMAL HIGH (ref 0–44)
AST: 58 U/L — ABNORMAL HIGH (ref 15–41)
Albumin: 2.7 g/dL — ABNORMAL LOW (ref 3.5–5.0)
Alkaline Phosphatase: 68 U/L (ref 38–126)
Anion gap: 9 (ref 5–15)
BUN: 16 mg/dL (ref 8–23)
CO2: 25 mmol/L (ref 22–32)
Calcium: 8.6 mg/dL — ABNORMAL LOW (ref 8.9–10.3)
Chloride: 105 mmol/L (ref 98–111)
Creatinine, Ser: 0.77 mg/dL (ref 0.61–1.24)
GFR calc Af Amer: >60 mL/min (ref >60)
GFR calc non Af Amer: >60 mL/min (ref >60)
Glucose, Bld: 162 mg/dL — ABNORMAL HIGH (ref 70–99)
Potassium: 3.4 mmol/L — ABNORMAL LOW (ref 3.5–5.1)
Sodium: 139 mmol/L (ref 135–145)
Total Bilirubin: 1.1 mg/dL (ref 0.3–1.2)
Total Protein: 5.9 g/dL — ABNORMAL LOW (ref 6.5–8.1)

## 2019-08-22 LAB — GLUCOSE, CAPILLARY
Glucose-Capillary: 239 mg/dL — ABNORMAL HIGH (ref 70–99)
Glucose-Capillary: 232 mg/dL — ABNORMAL HIGH (ref 70–99)
Glucose-Capillary: 231 mg/dL — ABNORMAL HIGH (ref 70–99)
Glucose-Capillary: 221 mg/dL — ABNORMAL HIGH (ref 70–99)

## 2019-08-22 MED ORDER — POTASSIUM CHLORIDE 2 MEQ/ML IV SOLN
INTRAVENOUS | Status: DC
  Administered 2019-08-22 – 2019-08-24 (×4): via INTRAVENOUS
  Filled 2019-08-22 (×8): qty 1000

## 2019-08-22 NOTE — Plan of Care (Signed)
Pt slept well during the night. No complaints of pain or discomfort noted. Pt alert but remains disoriented x4. Able to obey simple commands but still unable to hold conversation and still high risk for falls due to trying to get and still attempting to pull at lines. Vitals stable on RA. Full assist with ADLs. Regular repositioning and oral care done. B soft wrist restraints and roll bell maintained overnight. B mitts discontinued. Prophylactic foam dressings applied on wrist area.  IV fluids continued. Assisted with po intake and incontinence needs. No other issues, will monitor.   Problem: Activity: Goal: Risk for activity intolerance will decrease Outcome: Progressing   Problem: Nutrition: Goal: Adequate nutrition will be maintained Outcome: Progressing   Problem: Coping: Goal: Level of anxiety will decrease Outcome: Progressing   Problem: Skin Integrity: Goal: Risk for impaired skin integrity will decrease Outcome: Progressing

## 2019-08-22 NOTE — Progress Notes (Signed)
PROGRESS NOTE  Paul Terrell  [id number] DOB: [birth year] DOA: 08/15/2019 PCP: Tonia Ghent, MD   Brief Narrative: Paul Terrell is an 85 y.o. male with a history of T2DM, HTN, HLD, CAD s/p PCI, BPH, and GERD who presented from home on 11/22 with several days of decreased p.o. intake, dry cough, generalized weakness, lethargy and difficulty walking. He tested positive for COVID-19 November 21. On the day of admission his symptoms were worse, he fell out of bed with no loss of consciousness. Upon arrival to ED: Patient's blood pressure was elevated, tachycardic, tachypneic, fever of 102.5, troponin: 18-->17, repeat Covid 19+, chest x-ray came back positive for multifocal pneumonia.  CT head and cervical spine came back negative for acute findings. He was admitted for sepsis due to covid-19 pneumonia, started on remdesivir and steroids. Patient developed progressive and worsening altered mental status. Work-up with head CT, brain MRI and EEG are consistent with metabolic diffuse encephalopathy. He was subsequently transferred to [facility] 11/25 and remains encephalopathic though hypoxia has resolved. SNF recommended by PT.   Assessment & Plan: Principal Problem:   Sepsis due to COVID-19 [facility]) Active Problems:   Diabetes mellitus type II   Hyperlipidemia   CAD (coronary artery disease)   BPH (benign prostatic hyperplasia)   Anemia   Hypertension   Pneumonia due to COVID-19 virus   GERD (gastroesophageal reflux disease)   Thrombocytopenia (HCC)   Elevated liver enzymes   Acute metabolic encephalopathy  Sepsis due to to covid pneumonia: POA, resolved.   Acute hypoxic respiratory failure due to covid-19 pneumonia:  - Hypoxia resolved s/p remdesivir x5 days and steroids x3 days. Will continue holding steroids to minimize impact on mentation. - Continue to monitor inflammatory markers  Acute metabolic encephalopathy, acute delirium: Due to covid-19 infection as well as hospitalization in a  patient with chronic cerebrovascular disease noted on neuroimaging. UDS, EtOH, ammonia negative, B12 elevated. TSH 0.966. EEG without seizures, repeated neuroimaging without acute findings.  - Continue delirium precautions - Minimize restraints, sedating medications as able. Currently requiring some physical restraints to maintain his own safety. Note the patient was admitted after a fall out of bed at home.  - Continue seroquel 50mg  qHS, thiamine, vitamin C, zinc. - Very poor per oral intake continues, we will continue low level IVF.  - Consult palliative care given poor prognosis, persistent symptoms.  Elevated liver enzymes: Likely due to covid directly, overall improving.  - Monitor, avoid hepatotoxins  Hypertension:  - Continue metoprolol and lisinopril  T2DM: Recent HbA1c of 8.5% indicates poor control with hyperglycemia.  - Hold po meds - Give SSI, with erratic and decreased po intake, will not augment regimen at this time to avoid hypoglycemia  Hypokalemia:  - Supplement as indicated. With it only being mildly low, will supplement in IVF.   CAD s/p PCI: Troponin not elevated. No report of chest pain.  - Continue beta blocker, statin  Hyperlipidemia:  - Continue statin  BPH:  - Continue finasteride and flomax, monitor UOP.  GERD:  - Continue PPI  Anemia of chronic disease: Normocytic, mild and stable.  - Follow intermittently.  DVT prophylaxis: Lovenox 40mg  q24h Code Status: Full code Family Communication: Wife by phone this afternoon. I let her know that we will have palliative care reach out to her in addition to myself calling for clinical updates tomorrow. I shared that the recommendation is currently SNF, though she feels his mental status would be better at home, which I agree with,  though he is likely to require more support than she can provide.  Disposition Plan: Uncertain, anticipate a higher level of care needed at discharge, will begin pursuing SNF   Consultants:   None  Procedures:   None  Antimicrobials:  Remdesivir 11/22 - 11/26   Subjective: Confused, very HOH but will make eye contact but not speak. Remains agitated and pocketing/not swallowing pills. Eating about 10% with significant encouragement from staff.   Objective: Vitals:   08/21/19 1924 08/21/19 2354 08/22/19 0412 08/22/19 0745  BP: (!) 169/73 (!) 145/74 135/81 (!) 149/78  Pulse: 96 100 100 99  Resp: 18 16 20 16   Temp: 98.2 F (36.8 C) 98.3 F (36.8 C) 97.9 F (36.6 C) 98.2 F (36.8 C)  TempSrc: Oral Oral Oral Axillary  SpO2: 100% 100% 98% 99%  Weight:      Height:        Intake/Output Summary (Last 24 hours) at 08/22/2019 1551 Last data filed at 08/22/2019 1235 Gross per 24 hour  Intake 1124.34 ml  Output 900 ml  Net 224.34 ml   Filed Weights   08/18/19 1430  Weight: 69.6 kg    Gen: Frail, elderly male in no distress Pulm: Non-labored breathing room air. Clear to auscultation bilaterally.  CV: Regular rate and rhythm. No murmur, rub, or gallop. No JVD, no pedal edema. GI: Abdomen soft, non-tender, non-distended, with normoactive bowel sounds. No organomegaly or masses felt. Ext: Warm, no deformities Skin: No rashes, lesions or ulcers Neuro: Alert, nonverbal, disoriented, moves all extremities but not cooperative with full neuro exam. Psych: Judgement and insight appear impaired.  Data Reviewed: I have personally reviewed following labs and imaging studies  CBC: Recent Labs  Lab 08/15/19 2044 08/16/19 1500 08/16/19 1509 08/17/19 0522 08/18/19 0408 08/19/19 0016  WBC 5.7 5.6  --  8.0 8.7 8.9  NEUTROABS  --  4.6  --  6.4 6.7 6.3  HGB 12.2* 11.1* 10.5* 11.4* 12.0* 12.8*  HCT 36.0* 32.8* 31.0* 33.0* 34.9* 37.8*  MCV 87.2 87.9  --  85.1 84.9 86.9  PLT 160 176  --  191 222 [id number]   Basic Metabolic Panel: Recent Labs  Lab 08/16/19 1500  08/17/19 0522 08/18/19 0408 08/19/19 0016 08/20/19 0015 08/21/19 0058 08/22/19 0036  NA 136    < > 139 138 140 137 138 139  K 3.6   < > 3.5 3.4* 3.4* 3.8 3.4* 3.4*  CL 104  --  107 103 105 102 103 105  CO2 22  --  20* 22 23 19* 23 25  GLUCOSE 206*  --  184* 102* 91 226* 282* 162*  BUN 25*  --  28* 19 18 19 19 16   CREATININE 0.92  --  0.87 0.79 0.73 0.86 0.78 0.77  CALCIUM 8.3*  --  8.5* 8.6* 8.7* 8.8* 8.5* 8.6*  MG 2.0  --  1.9 1.8 1.8  --   --   --   PHOS 3.7  --  2.4* 2.2* 2.1*  --   --   --    < > = values in this interval not displayed.   GFR: Estimated Creatinine Clearance: 55.5 mL/min (by C-G formula based on SCr of 0.77 mg/dL). Liver Function Tests: Recent Labs  Lab 08/18/19 0408 08/19/19 0016 08/20/19 0015 08/21/19 0058 08/22/19 0036  AST 112* 122* 105* 58* 58*  ALT 91* 92* 92* 74* 71*  ALKPHOS 54 60 72 69 68  BILITOT 1.0 0.8 1.2 1.3* 1.1  PROT 6.0* 6.1*  6.5 6.2* 5.9*  ALBUMIN 2.8* 2.9* 3.2* 3.0* 2.7*   No results for input(s): LIPASE, AMYLASE in the last 168 hours. Recent Labs  Lab 08/15/19 2020  AMMONIA 11   Coagulation Profile: No results for input(s): INR, PROTIME in the last 168 hours. Cardiac Enzymes: No results for input(s): CKTOTAL, CKMB, CKMBINDEX, TROPONINI in the last 168 hours. BNP (last 3 results) No results for input(s): PROBNP in the last 8760 hours. HbA1C: No results for input(s): HGBA1C in the last 72 hours. CBG: Recent Labs  Lab 08/21/19 1149 08/21/19 1619 08/21/19 2107 08/22/19 0747 08/22/19 1209  GLUCAP 315* 259* 133* 239* 232*   Lipid Profile: No results for input(s): CHOL, HDL, LDLCALC, TRIG, CHOLHDL, LDLDIRECT in the last 72 hours. Thyroid Function Tests: No results for input(s): TSH, T4TOTAL, FREET4, T3FREE, THYROIDAB in the last 72 hours. Anemia Panel: No results for input(s): VITAMINB12, FOLATE, FERRITIN, TIBC, IRON, RETICCTPCT in the last 72 hours. Urine analysis:    Component Value Date/Time   COLORURINE AMBER (A) 08/15/2019 1609   APPEARANCEUR CLOUDY (A) 08/15/2019 1609   LABSPEC 1.024 08/15/2019 1609    PHURINE 5.0 08/15/2019 1609   GLUCOSEU 50 (A) 08/15/2019 1609   HGBUR SMALL (A) 08/15/2019 1609   BILIRUBINUR NEGATIVE 08/15/2019 1609   KETONESUR 5 (A) 08/15/2019 1609   PROTEINUR 100 (A) 08/15/2019 1609   NITRITE NEGATIVE 08/15/2019 1609   LEUKOCYTESUR NEGATIVE 08/15/2019 1609   Recent Results (from the past 240 hour(s))  Blood culture (routine x 2)     Status: None   Collection Time: 08/15/19  4:07 PM   Specimen: BLOOD  Result Value Ref Range Status   Specimen Description BLOOD LEFT ANTECUBITAL  Final   Special Requests   Final    AEROBIC BOTTLE ONLY Blood Culture results may not be optimal due to an inadequate volume of blood received in culture bottles   Culture   Final    NO GROWTH 5 DAYS Performed at [facility], [place] [address] [place], [place] [address]    Report Status 08/20/2019 FINAL  Final  Blood culture (routine x 2)     Status: None   Collection Time: 08/15/19  4:07 PM   Specimen: BLOOD  Result Value Ref Range Status   Specimen Description BLOOD RIGHT ANTECUBITAL  Final   Special Requests   Final    BOTTLES DRAWN AEROBIC AND ANAEROBIC Blood Culture adequate volume   Culture   Final    NO GROWTH 5 DAYS Performed at [facility], [place] [address] [place], [place] [address]    Report Status 08/20/2019 FINAL  Final  Urine culture     Status: Abnormal   Collection Time: 08/15/19  4:09 PM   Specimen: Urine, Random  Result Value Ref Range Status   Specimen Description URINE, RANDOM  Final   Special Requests   Final    NONE Performed at [facility], [place] [address] [place], [place] [address]    Culture MULTIPLE SPECIES PRESENT, SUGGEST RECOLLECTION (A)  Final   Report Status 08/16/2019 FINAL  Final      Radiology Studies: No results found.  Scheduled Meds: . enoxaparin (LOVENOX) injection  40 mg Subcutaneous Q24H  . finasteride  5 mg Oral Daily  . insulin aspart  0-9 Units Subcutaneous TID WC  . lisinopril  2.5 mg Oral Daily  .  metoprolol succinate  50 mg Oral Daily  . multivitamin with minerals  1 tablet Oral Daily  .  pantoprazole  40 mg Oral Daily  . pravastatin  20 mg Oral QHS  . QUEtiapine  50 mg Oral QHS  . tamsulosin  0.4 mg Oral QHS  . thiamine  100 mg Oral Daily  . vitamin C  500 mg Oral Daily  . zinc sulfate  220 mg Oral Daily   Continuous Infusions: . lactated ringers 50 mL/hr at 08/22/19 0950     LOS: 7 days   Time spent: 25 minutes.  Patrecia Pour, MD [facility] [email or web address] 08/22/2019, 3:51 PM

## 2019-08-22 NOTE — NC FL2 (Signed)
[place] LEVEL OF CARE SCREENING TOOL     IDENTIFICATION  Patient Name: Paul Terrell Birthdate: 06/20/1931 Sex: male Admission Date (Current Location): 08/15/2019  [facility] and [place] Number:  Herbalist and Address:  [facility], [place] [address] [place], [place] [address]([place])      Provider Number: [id number]  Attending Physician Name and Address:  Patrecia Pour, MD  Relative Name and Phone Number:  Everlene Farrier (spouse) [phone]    Current Level of Care: Hospital Recommended Level of Care: [place] Prior Approval Number:    Date Approved/Denied:   PASRR Number: [id number]  Discharge Plan: SNF    Current Diagnoses: Patient Active Problem List   Diagnosis Date Noted  . Acute metabolic encephalopathy [id number]  . Pneumonia due to COVID-19 virus 08/15/2019  . Sepsis due to COVID-19 ([place]) 08/15/2019  . GERD (gastroesophageal reflux disease)   . Thrombocytopenia ([place])   . Elevated liver enzymes   . Abnormal gait 11/22/2018  . Skin tear of elbow without complication [id number]  . Anemia 05/14/2018  . Hypertension 05/14/2018  . Unsteadiness on feet 11/05/2017  . Spinal stenosis of lumbar region 11/08/2015  . Advance care planning 11/04/2014  . Leg pain 01/30/2014  . Medicare annual wellness visit, subsequent 11/01/2013  . BPH (benign prostatic hyperplasia)   . Diabetes mellitus type II   . Hyperlipidemia   . Hypertensive heart disease   . CAD (coronary artery disease)     Orientation RESPIRATION BLADDER Height & Weight     (disoriented x4)  Normal Incontinent, External catheter Weight: 153 lb 7 oz (69.6 kg) Height:  5\' 4"  (162.6 cm)  BEHAVIORAL SYMPTOMS/MOOD NEUROLOGICAL BOWEL NUTRITION STATUS      Incontinent Diet(see discharge summary)  AMBULATORY STATUS COMMUNICATION OF NEEDS Skin   Extensive Assist Verbally (abrasion chest, ecchymosis both arms, skin tear right arm, wound on  sternum)                       Personal Care Assistance Level of Assistance  Bathing, Feeding, Dressing, Total care Bathing Assistance: Maximum assistance Feeding assistance: Maximum assistance Dressing Assistance: Maximum assistance Total Care Assistance: Maximum assistance   Functional Limitations Info  Sight, Hearing, Speech Sight Info: Adequate Hearing Info: Impaired Speech Info: Adequate    SPECIAL CARE FACTORS FREQUENCY  PT (By licensed PT), OT (By licensed OT)     PT Frequency: min 5x weekly OT Frequency: min 5x weekly            Contractures Contractures Info: Not present    Additional Factors Info  Code Status, Allergies Code Status Info: full Allergies Info: Nsaids, Citrus, Zocor (simvastatin)           Current Medications (08/22/2019):  This is the current hospital active medication list Current Facility-Administered Medications  Medication Dose Route Frequency Provider Last Rate Last Dose  . acetaminophen (TYLENOL) tablet 650 mg  650 mg Oral Q6H PRN Pahwani, Rinka R, MD      . enoxaparin (LOVENOX) injection 40 mg  40 mg Subcutaneous Q24H Pahwani, Rinka R, MD   40 mg at 08/21/19 1853  . finasteride (PROSCAR) tablet 5 mg  5 mg Oral Daily Elodia Florence., MD   5 mg at 08/22/19 [id number]  . haloperidol (HALDOL) tablet 1 mg  1 mg Oral Q4H PRN Arrien, Jimmy Picket, MD       Or  . haloperidol lactate (HALDOL) injection 1  mg  1 mg Intramuscular Q4H PRN Arrien, Jimmy Picket, MD   1 mg at 08/19/19 2133  . insulin aspart (novoLOG) injection 0-9 Units  0-9 Units Subcutaneous TID WC Arrien, Jimmy Picket, MD   3 Units at 08/22/19 [phone]  . labetalol (NORMODYNE) injection 5 mg  5 mg Intravenous Q6H PRN Adefeso, Oladapo, DO      . lactated ringers infusion   Intravenous Continuous Arrien, Jimmy Picket, MD 50 mL/hr at 08/22/19 0950    . lisinopril (ZESTRIL) tablet 2.5 mg  2.5 mg Oral Daily Elodia Florence., MD   2.5 mg at 08/22/19 0941  .  metoprolol succinate (TOPROL-XL) 24 hr tablet 50 mg  50 mg Oral Daily Elodia Florence., MD   50 mg at 08/22/19 0941  . multivitamin with minerals tablet 1 tablet  1 tablet Oral Daily Elodia Florence., MD   1 tablet at 08/22/19 [phone]  . ondansetron (ZOFRAN) tablet 4 mg  4 mg Oral Q6H PRN Pahwani, Rinka R, MD       Or  . ondansetron (ZOFRAN) injection 4 mg  4 mg Intravenous Q6H PRN Pahwani, Rinka R, MD      . pantoprazole (PROTONIX) EC tablet 40 mg  40 mg Oral Daily Elodia Florence., MD   40 mg at 08/22/19 0942  . pravastatin (PRAVACHOL) tablet 20 mg  20 mg Oral QHS Elodia Florence., MD   20 mg at 08/21/19 2100  . QUEtiapine (SEROQUEL) tablet 50 mg  50 mg Oral QHS Peyton Bottoms, MD   50 mg at 08/21/19 2100  . Resource ThickenUp Clear   Oral PRN Arrien, Jimmy Picket, MD      . tamsulosin [facility]) capsule 0.4 mg  0.4 mg Oral QHS Elodia Florence., MD   0.4 mg at 08/21/19 2100  . thiamine (VITAMIN B-1) tablet 100 mg  100 mg Oral Daily Arrien, Jimmy Picket, MD   100 mg at 08/22/19 0942  . vitamin C (ASCORBIC ACID) tablet 500 mg  500 mg Oral Daily Pahwani, Rinka R, MD   500 mg at 08/22/19 0942  . zinc sulfate capsule 220 mg  220 mg Oral Daily Pahwani, Rinka R, MD   220 mg at 08/22/19 [id number]     Discharge Medications: Please see discharge summary for a list of discharge medications.  Relevant Imaging Results:  Relevant Lab Results:   Additional Information SSN: [id number]  Alberteen Sam, LCSW

## 2019-08-23 LAB — COMPREHENSIVE METABOLIC PANEL
ALT: 60 U/L — ABNORMAL HIGH (ref 0–44)
AST: 40 U/L (ref 15–41)
Albumin: 2.8 g/dL — ABNORMAL LOW (ref 3.5–5.0)
Alkaline Phosphatase: 64 U/L (ref 38–126)
Anion gap: 10 (ref 5–15)
BUN: 17 mg/dL (ref 8–23)
CO2: 27 mmol/L (ref 22–32)
Calcium: 8.5 mg/dL — ABNORMAL LOW (ref 8.9–10.3)
Chloride: 100 mmol/L (ref 98–111)
Creatinine, Ser: 0.75 mg/dL (ref 0.61–1.24)
GFR calc Af Amer: >60 mL/min (ref >60)
GFR calc non Af Amer: >60 mL/min (ref >60)
Glucose, Bld: 234 mg/dL — ABNORMAL HIGH (ref 70–99)
Potassium: 3.9 mmol/L (ref 3.5–5.1)
Sodium: 137 mmol/L (ref 135–145)
Total Bilirubin: 1.4 mg/dL — ABNORMAL HIGH (ref 0.3–1.2)
Total Protein: 6.1 g/dL — ABNORMAL LOW (ref 6.5–8.1)

## 2019-08-23 LAB — CBC
HCT: 42.8 % (ref 39.0–52.0)
Hemoglobin: 13.9 g/dL (ref 13.0–17.0)
MCH: 29.5 pg (ref 26.0–34.0)
MCHC: 32.5 g/dL (ref 30.0–36.0)
MCV: 90.9 fL (ref 80.0–100.0)
Platelets: 311 10*3/uL (ref 150–400)
RBC: 4.71 MIL/uL (ref 4.22–5.81)
RDW: 15.5 % (ref 11.5–15.5)
WBC: 7.4 10*3/uL (ref 4.0–10.5)
nRBC: 0 % (ref 0.0–0.2)

## 2019-08-23 LAB — GLUCOSE, CAPILLARY
Glucose-Capillary: 187 mg/dL — ABNORMAL HIGH (ref 70–99)
Glucose-Capillary: 232 mg/dL — ABNORMAL HIGH (ref 70–99)
Glucose-Capillary: 192 mg/dL — ABNORMAL HIGH (ref 70–99)
Glucose-Capillary: 147 mg/dL — ABNORMAL HIGH (ref 70–99)

## 2019-08-23 LAB — C-REACTIVE PROTEIN: CRP: 3.3 mg/dL — ABNORMAL HIGH (ref <1.0)

## 2019-08-23 MED ORDER — METOPROLOL SUCCINATE ER 100 MG PO TB24
100.0000 mg | ORAL_TABLET | Freq: Every day | ORAL | Status: DC
  Administered 2019-08-23 – 2019-08-27 (×5): 100 mg via ORAL
  Filled 2019-08-23 (×6): qty 1

## 2019-08-23 MED ORDER — LISINOPRIL 10 MG PO TABS
5.0000 mg | ORAL_TABLET | Freq: Every day | ORAL | Status: DC
  Administered 2019-08-23 – 2019-08-24 (×2): 5 mg via ORAL
  Filled 2019-08-23 (×2): qty 1

## 2019-08-23 MED ORDER — INSULIN GLARGINE 100 UNIT/ML ~~LOC~~ SOLN
6.0000 [IU] | Freq: Every day | SUBCUTANEOUS | Status: DC
  Administered 2019-08-23 – 2019-08-24 (×2): 6 [IU] via SUBCUTANEOUS
  Filled 2019-08-23 (×2): qty 0.06

## 2019-08-23 MED ORDER — INSULIN ASPART 100 UNIT/ML ~~LOC~~ SOLN: 2.0000 [IU] | Freq: Three times a day (TID) | SUBCUTANEOUS | Status: DC

## 2019-08-23 MED ORDER — LISINOPRIL 10 MG PO TABS: 10.0000 mg | ORAL_TABLET | Freq: Every day | ORAL | Status: DC

## 2019-08-23 NOTE — Progress Notes (Signed)
  Speech Language Pathology Treatment: Dysphagia  Patient Details Name: Paul Terrell MRN: [id number] DOB: [birth year] Today's Date: 08/23/2019 Time: [id number] SLP Time Calculation (min) (ACUTE ONLY): 18 min  Assessment / Plan / Recommendation Clinical Impression  Pt is restless and needs frequent repositioning, making it challenging to maintain adequate, upright positioning for PO intake. Pt consumed minimal amounts of honey thick liquids and purees, stating throughout that he did not want any more, but then still accepting a little bit. He tries to talk with boluses in his mouth but can be cued to swallow. Pt had a delayed belch followed by wet vocal quality and then coughing, concerning for possible esophageal issues (h/o GERD). Per RN, pt has been able to get meds PO but takes very little else. Encouraged staff to offer POs frequently throughout the day as able. Note that palliative care consult is pending as well. I am concerned that pt presents with signs of dysphagia but also that if pt's intake does not increase, alternative means of nutrition (even cortrak) would be at risk for aspiration given how much he moves in the bed. Will continue current diet for now, giving full supervision and careful assistance with feeding.    HPI HPI: 85 y.o. male with medical history significant of hypertension, hyperlipidemia, diabetes, coronary artery disease status post stents, BPH, GERD, HOH presents to emergency department due to increased confusion over several days, Dx pna, sepsis, COVID +, metabolic encephalopathy. Pt evaluated on 11/24 by SLP, swallow WNL, but mental status has deteriorated since then and MD requested reeval.       SLP Plan  Continue with current plan of care       Recommendations  Diet recommendations: Dysphagia 1 (puree);Honey-thick liquid Liquids provided via: Cup;Teaspoon Medication Administration: Crushed with puree Supervision: Staff to assist with self feeding;Full  supervision/cueing for compensatory strategies Compensations: Slow rate;Small sips/bites Postural Changes and/or Swallow Maneuvers: Seated upright 90 degrees;Upright 30-60 min after meal                Oral Care Recommendations: Oral care QID Follow up Recommendations: Skilled Nursing facility SLP Visit Diagnosis: Dysphagia, oropharyngeal phase (R13.12) Plan: Continue with current plan of care       GO                Venita Sheffield Alessandra Sawdey 08/23/2019, 10:22 AM  Pollyann Glen, M.A. [place] Acute Environmental education officer [phone] Office [phone]

## 2019-08-23 NOTE — Progress Notes (Addendum)
PROGRESS NOTE  Paul Terrell  [id number] DOB: 1931/07/24 DOA: 08/15/2019 PCP: Tonia Ghent, MD   Brief Narrative: Paul Terrell is an 85 y.o. male with a history of T2DM, HTN, HLD, CAD s/p PCI, BPH, and GERD who presented from home on 11/22 with several days of decreased p.o. intake, dry cough, generalized weakness, lethargy and difficulty walking. He tested positive for COVID-19 November 21. On the day of admission his symptoms were worse, he fell out of bed with no loss of consciousness. Upon arrival to ED: Patient's blood pressure was elevated, tachycardic, tachypneic, fever of 102.5, troponin: 18-->17, repeat Covid 19+, chest x-ray came back positive for multifocal pneumonia.  CT head and cervical spine came back negative for acute findings. He was admitted for sepsis due to covid-19 pneumonia, started on remdesivir and steroids. Patient developed progressive and worsening altered mental status. Work-up with head CT, brain MRI and EEG are consistent with metabolic diffuse encephalopathy. He was subsequently transferred to [facility] 11/25 and remains encephalopathic though hypoxia has resolved. SNF recommended by PT.   Assessment & Plan: Principal Problem:   Sepsis due to COVID-19 [facility]) Active Problems:   Diabetes mellitus type II   Hyperlipidemia   CAD (coronary artery disease)   BPH (benign prostatic hyperplasia)   Anemia   Hypertension   Pneumonia due to COVID-19 virus   GERD (gastroesophageal reflux disease)   Thrombocytopenia (H[facility])   Elevated liver enzymes   Acute metabolic encephalopathy  Sepsis due to to covid pneumonia: POA, resolved.   Acute hypoxic respiratory failure due to covid-19 pneumonia:  - Hypoxia resolved s/p remdesivir x5 days and steroids x3 days. Will continue holding steroids to minimize impact on mentation. - Continue to monitor inflammatory markers intermittently, improving.  Acute metabolic encephalopathy, acute delirium: Due to covid-19 infection as well  as hospitalization in a patient with chronic cerebrovascular disease noted on neuroimaging. UDS, EtOH, ammonia negative, B12 elevated. TSH 0.966. EEG without seizures, repeated neuroimaging without acute findings. Note pt very HOH but responsive once spoken to loudly. - Continue delirium precautions - Minimize restraints, sedating medications as able. Currently requiring some physical restraints to maintain his own safety. Note the patient was admitted after a fall out of bed at home.  - Continue seroquel 50mg  qHS, thiamine, vitamin C, zinc. - Very poor per oral intake continues, we will continue low level IVF.  Dysphagia: SLP continues to evaluate and pt has high risk of aspiration even if devliering nutrition through cortrak.  - Consult palliative care given poor prognosis, persistent symptoms.  Goals of care discussion: Spoke with wife at length for the past couple days. It really seems like this patient has expressed consistently in the past that he would not wish to be resuscitated if the reasonable expectation would be that he would never return to functional living. I expressed that I do not believe his medical outcome would improve if we attempted resuscitation.  - Wife confirms pt's DNR status today, will change in computer.  - We remain hopeful that he will improve clinically, po intake will pick up, and he will return to his previous functional status, though it is clear that is an uphill struggle.  Elevated liver enzymes: Likely due to covid directly, overall continues improving. Bilirubin slightly elevated.  - Will monitor intermittently. - Avoiding hepatotoxins  Hypertension:  - Continue metoprolol and lisinopril. Remains elevated so will increase lisinopril 2.5mg  > 5mg  and metoprolol 50mg  > 100mg  due to intermittent tachycardia  T2DM: Recent HbA1c  of 8.5% indicates poor control with hyperglycemia.  - Hold po meds - Give SSI, added low dose basal  Hypokalemia:  - Supplement  as indicated with IVF. Improving.  CAD s/p PCI: Troponin not elevated. No report of chest pain.  - Continue beta blocker, statin  Hyperlipidemia:  - Continue statin  BPH:  - Continue finasteride and flomax, monitor UOP.  GERD:  - Continue PPI  Anemia of chronic disease: Normocytic, mild and stable.  - Follow intermittently.  DVT prophylaxis: Lovenox 40mg  q24h Code Status: DNR Family Communication: Wife by phone this afternoon. I let her know that we will have palliative care reach out to her in addition to myself calling for clinical updates tomorrow. I shared that the recommendation is currently SNF, though she feels his mental status would be better at home, which I agree with, though he is likely to require more support than she can provide.  Disposition Plan: Uncertain, anticipate a higher level of care needed at discharge, will begin pursuing SNF  Consultants:   None  Procedures:   None  Antimicrobials:  Remdesivir 11/22 - 11/26   Subjective: Very HOH but wants to go home, less agitated and worked with SLP today. Took a small amount by mouth, but still not much. Denies any pain currently, though does have chronic back pain.   Objective: Vitals:   08/22/19 2140 08/22/19 2352 08/23/19 0329 08/23/19 0804  BP: (!) 146/93 (!) 142/84 (!) 152/84 (!) 165/74  Pulse: 87 91 92 73  Resp:  18 16 16   Temp:  97.9 F (36.6 C) 97.8 F (36.6 C) 97.9 F (36.6 C)  TempSrc:  Axillary Axillary Axillary  SpO2:  100% 100% 99%  Weight:      Height:        Intake/Output Summary (Last 24 hours) at 08/23/2019 0809 Last data filed at 08/23/2019 0416 Gross per 24 hour  Intake 1143.79 ml  Output 475 ml  Net 668.79 ml   Filed Weights   08/18/19 1430  Weight: 69.6 kg   Gen: Elderly male in no distress Pulm: Nonlabored breathing room air. Clear. CV: Regular rate and rhythm. No murmur, rub, or gallop. No JVD, no dependent edema. GI: Abdomen soft, non-tender, non-distended,  with normoactive bowel sounds.  Ext: Warm, no deformities Skin: No rashes, lesions or ulcers on visualized skin. Neuro: Alert and incompletely oriented. Verbal today. No focal neurological deficits. Psych: Judgement and insight appear impaired. Mood euthymic & affect congruent. Behavior is appropriate.    Data Reviewed: I have personally reviewed following labs and imaging studies  CBC: Recent Labs  Lab 08/16/19 1500 08/16/19 1509 08/17/19 0522 08/18/19 0408 08/19/19 0016 08/23/19 0225  WBC 5.6  --  8.0 8.7 8.9 7.4  NEUTROABS 4.6  --  6.4 6.7 6.3  --   HGB 11.1* 10.5* 11.4* 12.0* 12.8* 13.9  HCT 32.8* 31.0* 33.0* 34.9* 37.8* 42.8  MCV 87.9  --  85.1 84.9 86.9 90.9  PLT 176  --  191 222 257 [id number]   Basic Metabolic Panel: Recent Labs  Lab 08/16/19 1500  08/17/19 0522 08/18/19 0408 08/19/19 0016 08/20/19 0015 08/21/19 0058 08/22/19 0036 08/23/19 0225  NA 136   < > 139 138 140 137 138 139 137  K 3.6   < > 3.5 3.4* 3.4* 3.8 3.4* 3.4* 3.9  CL 104  --  107 103 105 102 103 105 100  CO2 22  --  20* 22 23 19* 23 25 27   GLUCOSE 206*  --  184* 102* 91 226* 282* 162* 234*  BUN 25*  --  28* 19 18 19 19 16 17   CREATININE 0.92  --  0.87 0.79 0.73 0.86 0.78 0.77 0.75  CALCIUM 8.3*  --  8.5* 8.6* 8.7* 8.8* 8.5* 8.6* 8.5*  MG 2.0  --  1.9 1.8 1.8  --   --   --   --   PHOS 3.7  --  2.4* 2.2* 2.1*  --   --   --   --    < > = values in this interval not displayed.   GFR: Estimated Creatinine Clearance: 55.5 mL/min (by C-G formula based on SCr of 0.75 mg/dL). Liver Function Tests: Recent Labs  Lab 08/19/19 0016 08/20/19 0015 08/21/19 0058 08/22/19 0036 08/23/19 0225  AST 122* 105* 58* 58* 40  ALT 92* 92* 74* 71* 60*  ALKPHOS 60 72 69 68 64  BILITOT 0.8 1.2 1.3* 1.1 1.4*  PROT 6.1* 6.5 6.2* 5.9* 6.1*  ALBUMIN 2.9* 3.2* 3.0* 2.7* 2.8*   No results for input(s): LIPASE, AMYLASE in the last 168 hours. No results for input(s): AMMONIA in the last 168 hours. Coagulation Profile:  No results for input(s): INR, PROTIME in the last 168 hours. Cardiac Enzymes: No results for input(s): CKTOTAL, CKMB, CKMBINDEX, TROPONINI in the last 168 hours. BNP (last 3 results) No results for input(s): PROBNP in the last 8760 hours. HbA1C: No results for input(s): HGBA1C in the last 72 hours. CBG: Recent Labs  Lab 08/22/19 0747 08/22/19 1209 08/22/19 1641 08/22/19 2031 08/23/19 0745  GLUCAP 239* 232* 231* 221* 187*   Lipid Profile: No results for input(s): CHOL, HDL, LDLCALC, TRIG, CHOLHDL, LDLDIRECT in the last 72 hours. Thyroid Function Tests: No results for input(s): TSH, T4TOTAL, FREET4, T3FREE, THYROIDAB in the last 72 hours. Anemia Panel: No results for input(s): VITAMINB12, FOLATE, FERRITIN, TIBC, IRON, RETICCTPCT in the last 72 hours. Urine analysis:    Component Value Date/Time   COLORURINE AMBER (A) 08/15/2019 1609   APPEARANCEUR CLOUDY (A) 08/15/2019 1609   LABSPEC 1.024 08/15/2019 1609   PHURINE 5.0 08/15/2019 1609   GLUCOSEU 50 (A) 08/15/2019 1609   HGBUR SMALL (A) 08/15/2019 1609   BILIRUBINUR NEGATIVE 08/15/2019 1609   KETONESUR 5 (A) 08/15/2019 1609   PROTEINUR 100 (A) 08/15/2019 1609   NITRITE NEGATIVE 08/15/2019 1609   LEUKOCYTESUR NEGATIVE 08/15/2019 1609   Recent Results (from the past 240 hour(s))  Blood culture (routine x 2)     Status: None   Collection Time: 08/15/19  4:07 PM   Specimen: BLOOD  Result Value Ref Range Status   Specimen Description BLOOD LEFT ANTECUBITAL  Final   Special Requests   Final    AEROBIC BOTTLE ONLY Blood Culture results may not be optimal due to an inadequate volume of blood received in culture bottles   Culture   Final    NO GROWTH 5 DAYS Performed at [facility], [place] [address], [place], [place] [address]    Report Status 08/20/2019 FINAL  Final  Blood culture (routine x 2)     Status: None   Collection Time: 08/15/19  4:07 PM   Specimen: BLOOD  Result Value Ref Range Status   Specimen  Description BLOOD RIGHT ANTECUBITAL  Final   Special Requests   Final    BOTTLES DRAWN AEROBIC AND ANAEROBIC Blood Culture adequate volume   Culture   Final    NO GROWTH 5 DAYS Performed at [facility], [address] [place], [place] [address]    Report Status 08/20/2019 FINAL  Final  Urine culture     Status: Abnormal   Collection Time: 08/15/19  4:09 PM   Specimen: Urine, Random  Result Value Ref Range Status   Specimen Description URINE, RANDOM  Final   Special Requests   Final    NONE Performed at [facility], [place] [address] [place], [place] [address]    Culture MULTIPLE SPECIES PRESENT, SUGGEST RECOLLECTION (A)  Final   Report Status 08/16/2019 FINAL  Final      Radiology Studies: No results found.  Scheduled Meds: . enoxaparin (LOVENOX) injection  40 mg Subcutaneous Q24H  . finasteride  5 mg Oral Daily  . insulin aspart  0-9 Units Subcutaneous TID WC  . lisinopril  2.5 mg Oral Daily  . metoprolol succinate  50 mg Oral Daily  . multivitamin with minerals  1 tablet Oral Daily  . pantoprazole  40 mg Oral Daily  . pravastatin  20 mg Oral QHS  . QUEtiapine  50 mg Oral QHS  . tamsulosin  0.4 mg Oral QHS  . thiamine  100 mg Oral Daily  . vitamin C  500 mg Oral Daily  . zinc sulfate  220 mg Oral Daily   Continuous Infusions: . lactated ringers with kcl 75 mL/hr at 08/23/19 0542     LOS: 8 days   Time spent: 25 minutes.  Patrecia Pour, MD [facility] [email or web address] 08/23/2019, 8:09 AM

## 2019-08-24 DIAGNOSIS — A4189 Other specified sepsis: Principal | ICD-10-CM

## 2019-08-24 DIAGNOSIS — Z515 Encounter for palliative care: Secondary | ICD-10-CM

## 2019-08-24 DIAGNOSIS — U071 COVID-19: Secondary | ICD-10-CM

## 2019-08-24 DIAGNOSIS — Z7189 Other specified counseling: Secondary | ICD-10-CM

## 2019-08-24 LAB — COMPREHENSIVE METABOLIC PANEL
ALT: 49 U/L — ABNORMAL HIGH (ref 0–44)
AST: 34 U/L (ref 15–41)
Albumin: 2.4 g/dL — ABNORMAL LOW (ref 3.5–5.0)
Alkaline Phosphatase: 62 U/L (ref 38–126)
Anion gap: 10 (ref 5–15)
BUN: 14 mg/dL (ref 8–23)
CO2: 26 mmol/L (ref 22–32)
Calcium: 8.6 mg/dL — ABNORMAL LOW (ref 8.9–10.3)
Chloride: 102 mmol/L (ref 98–111)
Creatinine, Ser: 0.66 mg/dL (ref 0.61–1.24)
GFR calc Af Amer: >60 mL/min (ref >60)
GFR calc non Af Amer: >60 mL/min (ref >60)
Glucose, Bld: 161 mg/dL — ABNORMAL HIGH (ref 70–99)
Potassium: 3.8 mmol/L (ref 3.5–5.1)
Sodium: 138 mmol/L (ref 135–145)
Total Bilirubin: 1.1 mg/dL (ref 0.3–1.2)
Total Protein: 5.4 g/dL — ABNORMAL LOW (ref 6.5–8.1)

## 2019-08-24 LAB — GLUCOSE, CAPILLARY
Glucose-Capillary: 185 mg/dL — ABNORMAL HIGH (ref 70–99)
Glucose-Capillary: 188 mg/dL — ABNORMAL HIGH (ref 70–99)
Glucose-Capillary: 262 mg/dL — ABNORMAL HIGH (ref 70–99)

## 2019-08-24 MED ORDER — DEXAMETHASONE SODIUM PHOSPHATE 10 MG/ML IJ SOLN
10.0000 mg | Freq: Once | INTRAMUSCULAR | Status: AC
  Administered 2019-08-24: 10 mg via INTRAVENOUS
  Filled 2019-08-24: qty 1

## 2019-08-24 MED ORDER — INSULIN GLARGINE 100 UNIT/ML ~~LOC~~ SOLN
8.0000 [IU] | Freq: Every day | SUBCUTANEOUS | Status: DC
  Administered 2019-08-25 – 2019-08-28 (×4): 8 [IU] via SUBCUTANEOUS
  Filled 2019-08-24 (×4): qty 0.08

## 2019-08-24 MED ORDER — LISINOPRIL 10 MG PO TABS
10.0000 mg | ORAL_TABLET | Freq: Every day | ORAL | Status: DC
  Administered 2019-08-25: 10 mg via ORAL
  Filled 2019-08-24: qty 1

## 2019-08-24 NOTE — Progress Notes (Signed)
Physical Therapy Treatment Patient Details Name: Paul Terrell MRN: [id number] DOB: Apr 16, 1931 Today's Date: 08/24/2019    History of Present Illness 85 year old male who presented with altered mental status.  He does have significant past medical history for hypertension, type 2 diabetes mellitus, dyslipidemia, coronary artery disease, BPH and GERD.  Patient was noted to have several days of decreased p.o. intake, dry cough, generalized weakness, lethargy and difficulty walking.  He tested positive for COVID-19 November 21.  On the day of admission his symptoms were worse, he fell out of bed with no loss of consciousness. admitted to the hospital with working diagnosis of SARS COVID-19 viral pneumonia. Systemic inflammatory response syndrome with endorgan failure, metabolic encephalopathy, sepsis present on admission, due to viral pneumonia.    PT Comments    Pt is making progress with mobility today, in initial session pt was able to sit EOB for some time and also attempt some standing with RW. Pt did require max a but did very well with mobility. He did c/o pain in his back and needing to rest hence returned to bed. Pt on room air and able to maintain sats in 90s. In pm session arrived to find pt more alert and more able to communicate and participate in skilled tx. In co tx with OTR was able to get pt to sit up in bed eat some lunch and drink a little of his juice. Educated staff on strategies to assist with feeding pt. Pt was able to sit EOB with max a and also stand x approx 1-51mins to complete clean up. Pt transferred to recliner with max a x 2. Pt still on room air, very HOH but able to let some of his needs known. Pt left sitting in recliner with posey belt in place and nurse notified.   Follow Up Recommendations  SNF     Equipment Recommendations  None recommended by PT    Recommendations for Other Services       Precautions / Restrictions Precautions Precautions: Fall;Other  (comment)(restraints) Restrictions Weight Bearing Restrictions: No    Mobility  Bed Mobility Overal bed mobility: Needs Assistance Bed Mobility: Supine to Sit;Sit to Supine     Supine to sit: Max assist Sit to supine: Max assist   General bed mobility comments: was able to sit EOB for some time supported  Transfers Overall transfer level: Needs assistance Equipment used: 1 person hand held assist Transfers: Sit to/from [employer] Transfers Sit to Stand: Max assist Stand pivot transfers: +2 physical assistance;Max assist;+2 safety/equipment       General transfer comment: able to get into chair this pm with max a x2  Ambulation/Gait                 Stairs             Wheelchair Mobility    Modified Rankin (Stroke Patients Only)       Balance Overall balance assessment: Mild deficits observed, not formally tested                                          Cognition Arousal/Alertness: Awake/alert Behavior During Therapy: WFL for tasks assessed/performed Overall Cognitive Status: Impaired/Different from baseline Area of Impairment: (seems to be closer to baseline at this time)                 Orientation Level: Person  Safety/Judgement: Decreased awareness of safety;Decreased awareness of deficits   Problem Solving: Requires verbal cues;Requires tactile cues;Difficulty sequencing;Decreased initiation General Comments: seems to be more alert and aware this pm and better able to follow commands etc, but he is severely [facility]      Exercises      General Comments        Pertinent Vitals/Pain Pain Assessment: Faces Faces Pain Scale: Hurts even more Pain Location: w/ mobility Pain Intervention(s): Limited activity within patient's tolerance;Monitored during session    Home Living                      Prior Function            PT Goals (current goals can now be found in the care plan section)  Acute Rehab PT Goals Patient Stated Goal: seems to be more talkertive this pm, states he has a new great great great granddaughter and would like to meet her, his goal is to get out of hospital to meet her. Time For Goal Achievement: 09/03/19 Potential to Achieve Goals: Good Progress towards PT goals: Progressing toward goals    Frequency    Min 2X/week      PT Plan Current plan remains appropriate    Co-evaluation PT/OT/SLP Co-Evaluation/Treatment: Yes Reason for Co-Treatment: Complexity of the patient's impairments (multi-system involvement);Necessary to address cognition/behavior during functional activity;For patient/therapist safety          AM-PAC PT "6 Clicks" Mobility   Outcome Measure  Help needed turning from your back to your side while in a flat bed without using bedrails?: A Lot Help needed moving from lying on your back to sitting on the side of a flat bed without using bedrails?: A Lot Help needed moving to and from a bed to a chair (including a wheelchair)?: A Lot Help needed standing up from a chair using your arms (e.g., wheelchair or bedside chair)?: A Lot Help needed to walk in hospital room?: Total Help needed climbing 3-5 steps with a railing? : Total 6 Click Score: 10    End of Session Equipment Utilized During Treatment: Gait belt Activity Tolerance: Patient limited by lethargy;Patient limited by fatigue Patient left: in chair;with call bell/phone within reach;with chair alarm set Nurse Communication: Mobility status PT Visit Diagnosis: Unsteadiness on feet (R26.81);Other abnormalities of gait and mobility (R26.89);Muscle weakness (generalized) (M62.81)    Time: 1018 - 1039 PT Time Calculation (min) (ACUTE ONLY) 21 min  Charges: $Therapeutic Activity: 8-22 mins  Time: [id number] PT Time Calculation (min) (ACUTE ONLY): 34 min  Charges:  $Therapeutic Activity: 8-22 mins                     Horald Chestnut, PT    Delford Field 08/24/2019, 4:21  PM

## 2019-08-24 NOTE — Consult Note (Signed)
Consultation Note Date: 08/24/2019   Patient Name: Paul Terrell Terrell  DOB: 1931/09/10  MRN: [id number]  Age / Sex: 85 y.o., male  PCP: Paul Terrell [place], MD Referring Physician: Patrecia Pour, MD  Reason for Consul3tation: Establishing goals of care  HPI/Patient Profile: 85 y.o. male  with past medical history of HTN, HLD, CAD s/p PCI, diabetes, GERD, BPH admitted on 08/15/2019 with weakness, decreased intake, dry cough, lethargy r/t + COVID infection and found to have multifocal pneumonia. Sepsis and COVID pneumonia have improved but he continues with encephalopathy/delirium and ongoing dysphagia but has been initiated on dysphagia 1, honey thick liquid diet with poor intake.   Clinical Assessment and Goals of Care: I discussed status with RN Paul Terrell Terrell as well as Paul Terrell Terrell. Paul Terrell Terrell continues with ongoing encephalopathy and is restless and confused and continues to require restraints. He also continues with poor intake on dys 1, honey thick diet.   I attempted to reach wife but no answer x 2. I called and left voicemail with son who did call me back. We discussed concern with his father's ongoing confusion and concern that this continues to be very severe. We also discussed concern with poor intake and how the cognitive impairment impacts his intake and ability to swallow. Paul Terrell Terrell does bring up himself about COVID and infection making his father decline further from his baseline. Paul Terrell Terrell does share that his father had some mild impairment mainly exhibited in behavior as he insisted to continue driving car/tractor and had some falls. He understands that his father's mild decline could be further exaggerated by his acute illness and delirium.   We did discuss hopes that he could continue to improve BUT if we do not begin to see some steady improvement then we could be faced with more difficult decisions. Paul Terrell Terrell understands and  tells me that family fully support DNR decision made by his mother yesterday. We discussed the balance of being hopeful but also preparing that his father is unlikely to return to his previous functional status and QOL. In our discussion it seems to be clear that family value his comfort and QOL as priority. We will continue to discuss progress and GOC over the next few days.   Paul Terrell Terrell plans to discuss with his family this evening and we will plan a conference call tomorrow with them all.   All questions/concerns addressed. Emotional support provided.   Primary Decision Maker NEXT OF KIN wife along with son and daughter    [place]   - Continue current level of care - Family still hopeful but also preparing that he may not improve  Code Status/Advance Care Planning:  DNR   Symptom Management:   Per attending  Palliative Prophylaxis:   Aspiration, Delirium Protocol, Frequent Pain Assessment, Oral Care and Turn Reposition  Psycho-social/Spiritual:   Desire for further Chaplaincy support:no  Additional Recommendations: Caregiving  Support/Resources, Education on Hospice and Grief/Bereavement Support  Prognosis:   Overall prognosis poor. Will monitor progression over the next few days.  Discharge Planning: To Be Determined      Primary Diagnoses: Present on Admission: . Pneumonia due to COVID-19 virus . Sepsis due to COVID-19 [facility]) . Hyperlipidemia . CAD (coronary artery disease) . BPH (benign prostatic hyperplasia) . Anemia . Hypertension . GERD (gastroesophageal reflux disease) . Thrombocytopenia ([place]) . Elevated liver enzymes . Acute metabolic encephalopathy   I have reviewed the medical record, interviewed the patient and family, and examined the patient. The following aspects are pertinent.  Past Medical History:  Diagnosis Date  . Arthritis   . Basal cell carcinoma of scalp 12/16/07   Reexcision (Dr. Merril Terrell. Paul Terrell Terrell)  . BPH (benign  prostatic hyperplasia)   . CAD (coronary artery disease) 11/01   stent placed  . Cataract   . Diabetes mellitus type II 11/01  . GERD (gastroesophageal reflux disease)    occasionally  . Hearing aid worn   . HOH (hard of hearing)   . Hyperlipemia 11/01  . Hypertension 11/01  . Ruptured disc, cervical    Neck C7 (Dr. Pearlie Oyster)   Social History   Socioeconomic History  . Marital status: Married    Spouse name: Not on file  . Number of children: 2  . Years of education: Not on file  . Highest education level: Not on file  Occupational History  . Occupation: Carpentry  Social Needs  . Financial resource strain: Not on file  . Food insecurity    Worry: Not on file    Inability: Not on file  . Transportation needs    Medical: Not on file    Non-medical: Not on file  Tobacco Use  . Smoking status: Former Smoker    Packs/day: 1.00    Years: 25.00    Pack years: 25.00    Types: Cigarettes    Quit date: 01/09/1976    Years since quitting: 43.6  . Smokeless tobacco: Current User    Types: Chew  . Tobacco comment: quit smoking about 25 years ago  Substance and Sexual Activity  . Alcohol use: No  . Drug use: No  . Sexual activity: Never  Lifestyle  . Physical activity    Days per week: Not on file    Minutes per session: Not on file  . Stress: Not on file  Relationships  . Social Herbalist on phone: Not on file    Gets together: Not on file    Attends religious service: Not on file    Active member of club or organization: Not on file    Attends meetings of clubs or organizations: Not on file    Relationship status: Not on file  Other Topics Concern  . Not on file  Social History Narrative   Married 1955   2 kids   Retired from general [employer]   Family History  Problem Relation Age of Onset  . Stroke Mother        Lived 2 years  . Heart disease Mother        CAD, Angioplasty X 2  . Hypertension Mother   . Heart disease Father        MI  .  Hypertension Father   . Hypertension Sister   . Hypertension Brother   . Heart disease Brother        MI  . Hyperlipidemia Brother   . Heart disease Brother        MI   Scheduled Meds: . enoxaparin (LOVENOX) injection  40 mg Subcutaneous Q24H  .  finasteride  5 mg Oral Daily  . insulin aspart  0-9 Units Subcutaneous TID WC  . insulin glargine  6 Units Subcutaneous Daily  . lisinopril  5 mg Oral Daily  . metoprolol succinate  100 mg Oral Daily  . multivitamin with minerals  1 tablet Oral Daily  . pantoprazole  40 mg Oral Daily  . pravastatin  20 mg Oral QHS  . QUEtiapine  50 mg Oral QHS  . tamsulosin  0.4 mg Oral QHS  . thiamine  100 mg Oral Daily  . vitamin C  500 mg Oral Daily  . zinc sulfate  220 mg Oral Daily   Continuous Infusions: . lactated ringers with kcl 75 mL/hr at 08/24/19 0518   PRN Meds:.acetaminophen, haloperidol **OR** haloperidol lactate, labetalol, ondansetron **OR** ondansetron (ZOFRAN) IV, Resource ThickenUp Clear Allergies  Allergen Reactions  . Nsaids Other (See Comments)    H/o anemia  . Citrus Hives  . Zocor [Simvastatin] Other (See Comments)    Myalgia    Review of Systems  Physical Exam  Vital Signs: BP (!) 167/84 (BP Location: Left Arm)   Pulse 61   Temp 97.8 F (36.6 C) (Oral)   Resp 14   Ht 5\' 4"  (1.626 m)   Wt 69.6 kg   SpO2 99%   BMI 26.34 kg/m  Pain Scale: 0-10   Pain Score: 0-No pain   SpO2: SpO2: 99 % O2 Device:SpO2: 99 % O2 Flow Rate: .   IO: Intake/output summary:   Intake/Output Summary (Last 24 hours) at 08/24/2019 0915 Last data filed at 08/24/2019 [id number] Gross per 24 hour  Intake 2207.64 ml  Output 725 ml  Net 1482.64 ml    LBM: Last BM Date: 08/23/19 Baseline Weight: Weight: 69.6 kg Most recent weight: Weight: 69.6 kg     Palliative Assessment/Data:     Time In: 1440 Time Out: 1520 Time Total: 40 min Greater than 50%  of this time was spent counseling and coordinating care related to the above  assessment and plan.  Signed by: Vinie Sill, NP Palliative Medicine Team Pager # [phone] (M-F 8a-5p) Team Phone # [phone] (Nights/Weekends)   The above conversation was completed via telephone due to the visitor restrictions during the COVID-19 pandemic. Thorough chart review and discussion with necessary members of the care team was completed as part of assessment. All issues were discussed and addressed but no physical exam was performed.

## 2019-08-24 NOTE — Progress Notes (Signed)
Occupational Therapy Treatment Patient Details Name: Paul Terrell MRN: [id number] DOB: 1931/06/07 Today's Date: 08/24/2019    History of present illness 85 year old male who presented with altered mental status.  He does have significant past medical history for hypertension, type 2 diabetes mellitus, dyslipidemia, coronary artery disease, BPH and GERD.  Patient was noted to have several days of decreased p.o. intake, dry cough, generalized weakness, lethargy and difficulty walking.  He tested positive for COVID-19 November 21.  On the day of admission his symptoms were worse, he fell out of bed with no loss of consciousness. admitted to the hospital with working diagnosis of SARS COVID-19 viral pneumonia. Systemic inflammatory response syndrome with endorgan failure, metabolic encephalopathy, sepsis present on admission, due to viral pneumonia.   OT comments  Bed alarm sounding and pt yelling for help on entry to room. Pt in B wrist restrains and sliding to side of bed. Restraints removed and pt able to progress to EOB to stand and eventually take pivotal steps to recliner with mod A +2. Once upright, pt eating and drinking with initial hand over  Hand assist. Pt able to say "that's enough and thank you". Pt thanking therapists for helping him and talking about his great great great grand daughter that he wants to see. Pt is extremely hard of hearing and apologized for not being able to hear [place] because he "doesn't have his hearing aids". Pt calm in recliner/ seat belt alarm used to increase safety. Recommend nsg offer toileting q 2 hrs and increase mobility to decrease restlessness and reduce need for restraints. Will continue to follow acutley and recommend rehab at [facility].   Follow Up Recommendations  SNF    Equipment Recommendations  None recommended by OT    Recommendations for Other Services      Precautions / Restrictions Precautions Precautions: Fall;Other  (comment)(restraints) Restrictions Weight Bearing Restrictions: No       Mobility Bed Mobility Overal bed mobility: Needs Assistance Bed Mobility: Supine to Sit;Sit to Supine     Supine to sit: Max assist Sit to supine: Max assist; assist to bring B legs off bed; pt able to assist with pushing trunk uprigiht   General bed mobility comments: was able to sit EOB for some time supported  Transfers Overall transfer level: Needs assistance Equipment used: 1 person hand held assist Transfers: Sit to/from Stand Sit to Stand: Mod assist Stand pivot transfers: +2 physical assistance;Max assist;+2 safety/equipment       General transfer comment: Pt holding onto OT and powering up with min A 3rd trial of standing; Max A to stand pivot    Balance Overall balance assessment: Mild deficits observed, not formally tested                                         ADL either performed or assessed with clinical judgement   ADL Overall ADL's : Needs assistance/impaired Eating/Feeding: Moderate assistance Eating/Feeding Details (indicate cue type and reason): hand over hand to complete feeding pattern although pt could grasp utensil/cup and bring to mouth; ate 10 bites potatoes; 5 bites pear; drank 5 sips juice Grooming: Moderate assistance Grooming Details (indicate cue type and reason): able to wash off mouth on command Upper Body Bathing: Maximal assistance   Lower Body Bathing: Maximal assistance                 Toileting -  Clothing Manipulation Details (indicate cue type and reason): per nursing, ptis incontinent, however pt is restrained in bed and has not been offered [facility]     Functional mobility during ADLs: +2 for safety/equipment;Moderate assistance       Vision   Additional Comments: unsure   Perception     Praxis      Cognition Arousal/Alertness: Awake/alert Behavior During Therapy: Impulsive;Restless Overall Cognitive Status:  Impaired/Different from baseline Area of Impairment: Orientation;Attention;Memory;Following commands;Safety/judgement;Awareness;Problem solving(seems to be closer to baseline at this time)                 Orientation Level: Disoriented to;Place;Time;Situation Current Attention Level: Sustained Memory: Decreased short-term memory Following Commands: Follows one step commands inconsistently(impacted by [facility]) Safety/Judgement: Decreased awareness of safety;Decreased awareness of deficits Awareness: Intellectual Problem Solving: Requires verbal cues;Requires tactile cues;Difficulty sequencing;Decreased initiation;Slow processing General Comments: appropirate with therapists asking for help and thanking [place] for helping him        Exercises     Shoulder Instructions       General Comments Pt left with seat belt alarm    Pertinent Vitals/ Pain       Pain Assessment: Faces Faces Pain Scale: Hurts even more Pain Location: w/ mobility Pain Descriptors / Indicators: Restless Pain Intervention(s): Limited activity within patient's tolerance  Home Living                                          Prior Functioning/Environment              Frequency  Min 2X/week        Progress Toward Goals  OT Goals(current goals can now be found in the care plan section)  Progress towards OT goals: Progressing toward goals  Acute Rehab OT Goals Patient Stated Goal: seems to be more talkertive this pm, states he has a new great great great granddaughter and would like to meet her, his goal is to get out of hospital to meet her. Time For Goal Achievement: 09/03/19 Potential to Achieve Goals: Fair ADL Goals Pt Will Perform Eating: with set-up;sitting Pt Will Perform Grooming: with set-up;sitting Pt Will Transfer to Toilet: with min assist;bedside commode Pt Will Perform Toileting - Clothing Manipulation and hygiene: with min assist;sitting/lateral leans;sit to/from  stand Additional ADL Goal #1: Pt to tolerate sitting EOB 10 min with supervision, in preparation for ADLs and transfers.  Plan Discharge plan remains appropriate    Co-evaluation    PT/OT/SLP Co-Evaluation/Treatment: Yes Reason for Co-Treatment: For patient/therapist safety;To address functional/ADL transfers   OT goals addressed during session: ADL's and self-care;Strengthening/ROM      AM-PAC OT "6 Clicks" Daily Activity     Outcome Measure   Help from another person eating meals?: A Lot Help from another person taking care of personal grooming?: A Lot Help from another person toileting, which includes using toliet, bedpan, or urinal?: A Lot Help from another person bathing (including washing, rinsing, drying)?: A Lot Help from another person to put on and taking off regular upper body clothing?: A Lot Help from another person to put on and taking off regular lower body clothing?: A Lot 6 Click Score: 12    End of Session    OT Visit Diagnosis: Unsteadiness on feet (R26.81);Muscle weakness (generalized) (M62.81)   Activity Tolerance Patient tolerated treatment well   Patient Left in chair;with call bell/phone within reach;with chair  alarm set(seat belt alarm)   Nurse Communication Mobility status;Other (comment)(question of need for restraints)        Time: [id number] OT Time Calculation (min): 34 min  Charges: OT General Charges $OT Visit: 1 Visit OT Treatments $Self Care/Home Management : 8-22 mins  Maurie Boettcher, OT/L   Acute OT Clinical Specialist Success Pager [phone] Office [phone]    [facility] 08/24/2019, 4:51 PM

## 2019-08-24 NOTE — Plan of Care (Signed)
  Problem: Education: Goal: Knowledge of General Education information will improve Description: Including pain rating scale, medication(s)/side effects and non-pharmacologic comfort measures 08/24/2019 0802 by Orvan Falconer, RN Outcome: Progressing 08/24/2019 0802 by Orvan Falconer, RN Outcome: Progressing   Problem: Health Behavior/Discharge Planning: Goal: Ability to manage health-related needs will improve 08/24/2019 0802 by Orvan Falconer, RN Outcome: Progressing 08/24/2019 0802 by Orvan Falconer, RN Outcome: Progressing   Problem: Clinical Measurements: Goal: Ability to maintain clinical measurements within normal limits will improve 08/24/2019 0802 by Orvan Falconer, RN Outcome: Progressing 08/24/2019 0802 by Orvan Falconer, RN Outcome: Progressing Goal: Will remain free from infection 08/24/2019 0802 by Orvan Falconer, RN Outcome: Progressing 08/24/2019 0802 by Orvan Falconer, RN Outcome: Progressing Goal: Diagnostic test results will improve 08/24/2019 0802 by Orvan Falconer, RN Outcome: Progressing 08/24/2019 0802 by Orvan Falconer, RN Outcome: Progressing Goal: Respiratory complications will improve 08/24/2019 0802 by Orvan Falconer, RN Outcome: Progressing 08/24/2019 0802 by Orvan Falconer, RN Outcome: Progressing Goal: Cardiovascular complication will be avoided 08/24/2019 0802 by Orvan Falconer, RN Outcome: Progressing 08/24/2019 0802 by Orvan Falconer, RN Outcome: Progressing   Problem: Activity: Goal: Risk for activity intolerance will decrease 08/24/2019 0802 by Orvan Falconer, RN Outcome: Progressing 08/24/2019 0802 by Orvan Falconer, RN Outcome: Progressing   Problem: Nutrition: Goal: Adequate nutrition will be maintained 08/24/2019 0802 by Orvan Falconer, RN Outcome: Progressing 08/24/2019 0802 by Orvan Falconer, RN Outcome: Progressing   Problem: Coping: Goal: Level of anxiety will  decrease 08/24/2019 0802 by Orvan Falconer, RN Outcome: Progressing 08/24/2019 0802 by Orvan Falconer, RN Outcome: Progressing   Problem: Elimination: Goal: Will not experience complications related to bowel motility 08/24/2019 0802 by Orvan Falconer, RN Outcome: Progressing 08/24/2019 0802 by Orvan Falconer, RN Outcome: Progressing Goal: Will not experience complications related to urinary retention 08/24/2019 0802 by Orvan Falconer, RN Outcome: Progressing 08/24/2019 0802 by Orvan Falconer, RN Outcome: Progressing   Problem: Pain Managment: Goal: General experience of comfort will improve 08/24/2019 0802 by Orvan Falconer, RN Outcome: Progressing 08/24/2019 0802 by Orvan Falconer, RN Outcome: Progressing   Problem: Safety: Goal: Ability to remain free from injury will improve 08/24/2019 0802 by Orvan Falconer, RN Outcome: Progressing 08/24/2019 0802 by Orvan Falconer, RN Outcome: Progressing   Problem: Skin Integrity: Goal: Risk for impaired skin integrity will decrease 08/24/2019 0802 by Orvan Falconer, RN Outcome: Progressing 08/24/2019 0802 by Orvan Falconer, RN Outcome: Progressing   Problem: Education: Goal: Knowledge of risk factors and measures for prevention of condition will improve 08/24/2019 0802 by Orvan Falconer, RN Outcome: Progressing 08/24/2019 0802 by Orvan Falconer, RN Outcome: Progressing   Problem: Coping: Goal: Psychosocial and spiritual needs will be supported 08/24/2019 0802 by Orvan Falconer, RN Outcome: Progressing 08/24/2019 0802 by Orvan Falconer, RN Outcome: Progressing   Problem: Respiratory: Goal: Will maintain a patent airway 08/24/2019 0802 by Orvan Falconer, RN Outcome: Progressing 08/24/2019 0802 by Orvan Falconer, RN Outcome: Progressing Goal: Complications related to the disease process, condition or treatment will be avoided or minimized 08/24/2019 0802 by Orvan Falconer, RN Outcome: Progressing 08/24/2019 0802 by Orvan Falconer, RN Outcome: Progressing

## 2019-08-24 NOTE — Plan of Care (Signed)
Pt slept well during the night. No complaints of pain or discomfort noted. Pt alert but remains disoriented x4. Appears more alert and interactive than when I last met him 2 days ago. Able to obey simple commands and answer some simple questions but still rambles after a few seconds and still high risk for falls due to trying to slide down the bed and still attempting to pull at lines when restraints are released. Vitals stable on RA. Full assist with ADLs. Regular repositioning and oral care done. B soft wrist restraints maintained. Prophylactic foam dressings  on wrist and sacral area intact.  IV fluids continued. Assisted with po intake and incontinence needs. No other issues, will monitor.   Problem: Health Behavior/Discharge Planning: Goal: Ability to manage health-related needs will improve Outcome: Progressing   Problem: Activity: Goal: Risk for activity intolerance will decrease Outcome: Progressing   Problem: Nutrition: Goal: Adequate nutrition will be maintained Outcome: Progressing   Problem: Safety: Goal: Ability to remain free from injury will improve Outcome: Progressing   Problem: Skin Integrity: Goal: Risk for impaired skin integrity will decrease Outcome: Progressing

## 2019-08-24 NOTE — Progress Notes (Signed)
PROGRESS NOTE  Paul Terrell  [id number] DOB: [birth year] DOA: 08/15/2019 PCP: Tonia Ghent, MD   Brief Narrative: Paul Terrell is an 85 y.o. male with a history of T2DM, HTN, HLD, CAD s/p PCI, BPH, and GERD who presented from home on 11/22 with several days of decreased p.o. intake, dry cough, generalized weakness, lethargy and difficulty walking. He tested positive for COVID-19 November 21. On the day of admission his symptoms were worse, he fell out of bed with no loss of consciousness. Upon arrival to ED: Patient's blood pressure was elevated, tachycardic, tachypneic, fever of 102.5, troponin: 18-->17, repeat Covid 19+, chest x-ray came back positive for multifocal pneumonia.  CT head and cervical spine came back negative for acute findings. He was admitted for sepsis due to covid-19 pneumonia, started on remdesivir and steroids. Patient developed progressive and worsening altered mental status. Work-up with head CT, brain MRI and EEG are consistent with metabolic diffuse encephalopathy. He was subsequently transferred to [facility] 11/25 and remains encephalopathic though hypoxia has resolved. SNF recommended by PT.   Assessment & Plan: Principal Problem:   Sepsis due to COVID-19 [facility]) Active Problems:   Diabetes mellitus type II   Hyperlipidemia   CAD (coronary artery disease)   BPH (benign prostatic hyperplasia)   Anemia   Hypertension   Pneumonia due to COVID-19 virus   GERD (gastroesophageal reflux disease)   Thrombocytopenia (HCC)   Elevated liver enzymes   Acute metabolic encephalopathy  Sepsis due to to covid pneumonia: POA, resolved.   Acute hypoxic respiratory failure due to covid-19 pneumonia:  - Hypoxia resolved s/p remdesivir x5 days and steroids x3 days. Will continue holding steroids to minimize impact on mentation. - Continue to monitor inflammatory markers intermittently, improving.  Acute metabolic encephalopathy, acute delirium: Due to covid-19 infection as well  as hospitalization in a patient with chronic cerebrovascular disease noted on neuroimaging. UDS, EtOH, ammonia negative, B12 elevated. TSH 0.966. EEG without seizures, repeated neuroimaging without acute findings. Note pt very HOH but responsive once spoken to loudly. - Continue delirium precautions - Minimize restraints, sedating medications as able. No longer requiring restraints.   - Continue seroquel 50mg  qHS, thiamine, vitamin C, zinc. - Very poor per oral intake continues, we will continue low level IVF.  Dysphagia: SLP continues to evaluate and pt has high risk of aspiration even if devliering nutrition through cortrak.  - Consulted palliative care given poor prognosis, persistent symptoms.  Goals of care discussion:  - Confirmed change to DNR - Patient remains IV fluid-dependent. - We remain hopeful that he will improve clinically, po intake will pick up, and he will return to his previous functional status, though it is clear that is an uphill struggle. Palliative care consult is pending.  Elevated liver enzymes: Likely due to covid directly, overall continues improving. Bilirubin normalized - Will monitor intermittently. - Avoiding hepatotoxins  Hypertension:  - Continue metoprolol and lisinopril. Remains elevated so will increase lisinopril further starting tomorrow, already increased metoprolol 50mg  > 100mg  due to intermittent tachycardia  T2DM: Recent HbA1c of 8.5% indicates poor control with hyperglycemia.  - Hold po meds - Give SSI, added low dose basal, increase dose today.   Hypokalemia:  - Resolved, continue IV fluid supplementation  CAD s/p PCI: Troponin not elevated. No report of chest pain.  - Continue beta blocker, statin  Hyperlipidemia:  - Continue statin  BPH:  - Continue finasteride and flomax, monitor UOP.  GERD:  - Continue PPI  Anemia of chronic  disease: Normocytic, mild and stable.  - Follow intermittently.  DVT prophylaxis: Lovenox  40mg  q24h Code Status: DNR Family Communication: Wife by phone again this afternoon. I let her know that we will have palliative care reach out to her in addition to myself calling for clinical updates tomorrow. I shared that the recommendation is currently SNF, though she feels his mental status would be better at home, which I agree with, though he is likely to require more support than she can provide.  Disposition Plan: Uncertain, anticipate a higher level of care needed at discharge, will begin pursuing SNF  Consultants:   Palliative care team 11/29  Procedures:   None  Antimicrobials:  Remdesivir 11/22 - 11/26   Subjective: No restraints needed. Still eating nearly nothing even with frequent offerings. Has difficulty following commands with PT. He has no complaints to me.  Objective: Vitals:   08/24/19 0008 08/24/19 0402 08/24/19 0754 08/24/19 1205  BP: (!) 147/77 (!) 151/61 (!) 167/84 (!) 177/73  Pulse: 67 61 61 (!) 54  Resp: 14 15 14 15   Temp: 97.9 F (36.6 C) 97.9 F (36.6 C) 97.8 F (36.6 C) 97.6 F (36.4 C)  TempSrc: Oral Oral Oral Axillary  SpO2: 99% 98% 99% 99%  Weight:      Height:        Intake/Output Summary (Last 24 hours) at 08/24/2019 1303 Last data filed at 08/24/2019 1039 Gross per 24 hour  Intake 2227.64 ml  Output 725 ml  Net 1502.64 ml   Filed Weights   08/18/19 1430  Weight: 69.6 kg   Gen: Elderly male in no distress Pulm: Nonlabored breathing room air. Clear. CV: Regular rate and rhythm. No murmur, rub, or gallop. No JVD, no dependent edema. GI: Abdomen soft, non-tender, non-distended, with normoactive bowel sounds.  Ext: Warm, no deformities Skin: No new rashes, lesions or ulcers on visualized skin. Neuro: Alert, very HOH, not completely oriented. Diffusely very weak but no focal neurological deficits. Psych: Judgement and insight appear impaired. Mood euthymic & affect congruent. Behavior is appropriate.    Data Reviewed: I have  personally reviewed following labs and imaging studies  CBC: Recent Labs  Lab 08/18/19 0408 08/19/19 0016 08/23/19 0225  WBC 8.7 8.9 7.4  NEUTROABS 6.7 6.3  --   HGB 12.0* 12.8* 13.9  HCT 34.9* 37.8* 42.8  MCV 84.9 86.9 90.9  PLT 222 257 [id number]   Basic Metabolic Panel: Recent Labs  Lab 08/18/19 0408 08/19/19 0016 08/20/19 0015 08/21/19 0058 08/22/19 0036 08/23/19 0225 08/24/19 0307  NA 138 140 137 138 139 137 138  K 3.4* 3.4* 3.8 3.4* 3.4* 3.9 3.8  CL 103 105 102 103 105 100 102  CO2 22 23 19* 23 25 27 26   GLUCOSE 102* 91 226* 282* 162* 234* 161*  BUN 19 18 19 19 16 17 14   CREATININE 0.79 0.73 0.86 0.78 0.77 0.75 0.66  CALCIUM 8.6* 8.7* 8.8* 8.5* 8.6* 8.5* 8.6*  MG 1.8 1.8  --   --   --   --   --   PHOS 2.2* 2.1*  --   --   --   --   --    GFR: Estimated Creatinine Clearance: 55.5 mL/min (by C-G formula based on SCr of 0.66 mg/dL). Liver Function Tests: Recent Labs  Lab 08/20/19 0015 08/21/19 0058 08/22/19 0036 08/23/19 0225 08/24/19 0307  AST 105* 58* 58* 40 34  ALT 92* 74* 71* 60* 49*  ALKPHOS 72 69 68 64 62  BILITOT 1.2 1.3* 1.1 1.4* 1.1  PROT 6.5 6.2* 5.9* 6.1* 5.4*  ALBUMIN 3.2* 3.0* 2.7* 2.8* 2.4*   No results for input(s): LIPASE, AMYLASE in the last 168 hours. No results for input(s): AMMONIA in the last 168 hours. Coagulation Profile: No results for input(s): INR, PROTIME in the last 168 hours. Cardiac Enzymes: No results for input(s): CKTOTAL, CKMB, CKMBINDEX, TROPONINI in the last 168 hours. BNP (last 3 results) No results for input(s): PROBNP in the last 8760 hours. HbA1C: No results for input(s): HGBA1C in the last 72 hours. CBG: Recent Labs  Lab 08/23/19 1219 08/23/19 1600 08/23/19 2126 08/24/19 0751 08/24/19 1202  GLUCAP 232* 192* 147* 185* 188*   Lipid Profile: No results for input(s): CHOL, HDL, LDLCALC, TRIG, CHOLHDL, LDLDIRECT in the last 72 hours. Thyroid Function Tests: No results for input(s): TSH, T4TOTAL, FREET4, T3FREE,  THYROIDAB in the last 72 hours. Anemia Panel: No results for input(s): VITAMINB12, FOLATE, FERRITIN, TIBC, IRON, RETICCTPCT in the last 72 hours. Urine analysis:    Component Value Date/Time   COLORURINE AMBER (A) 08/15/2019 1609   APPEARANCEUR CLOUDY (A) 08/15/2019 1609   LABSPEC 1.024 08/15/2019 1609   PHURINE 5.0 08/15/2019 1609   GLUCOSEU 50 (A) 08/15/2019 1609   HGBUR SMALL (A) 08/15/2019 1609   BILIRUBINUR NEGATIVE 08/15/2019 1609   KETONESUR 5 (A) 08/15/2019 1609   PROTEINUR 100 (A) 08/15/2019 1609   NITRITE NEGATIVE 08/15/2019 1609   LEUKOCYTESUR NEGATIVE 08/15/2019 1609   Recent Results (from the past 240 hour(s))  Blood culture (routine x 2)     Status: None   Collection Time: 08/15/19  4:07 PM   Specimen: BLOOD  Result Value Ref Range Status   Specimen Description BLOOD LEFT ANTECUBITAL  Final   Special Requests   Final    AEROBIC BOTTLE ONLY Blood Culture results may not be optimal due to an inadequate volume of blood received in culture bottles   Culture   Final    NO GROWTH 5 DAYS Performed at [facility], [place] [address] [place], [place] [address]    Report Status 08/20/2019 FINAL  Final  Blood culture (routine x 2)     Status: None   Collection Time: 08/15/19  4:07 PM   Specimen: BLOOD  Result Value Ref Range Status   Specimen Description BLOOD RIGHT ANTECUBITAL  Final   Special Requests   Final    BOTTLES DRAWN AEROBIC AND ANAEROBIC Blood Culture adequate volume   Culture   Final    NO GROWTH 5 DAYS Performed at [facility], [place] [address], [place], [place] [address]    Report Status 08/20/2019 FINAL  Final  Urine culture     Status: Abnormal   Collection Time: 08/15/19  4:09 PM   Specimen: Urine, Random  Result Value Ref Range Status   Specimen Description URINE, RANDOM  Final   Special Requests   Final    NONE Performed at [facility], [place] [address], [place], [place] [address]    Culture MULTIPLE SPECIES PRESENT, SUGGEST  RECOLLECTION (A)  Final   Report Status 08/16/2019 FINAL  Final      Radiology Studies: No results found.  Scheduled Meds: . enoxaparin (LOVENOX) injection  40 mg Subcutaneous Q24H  . finasteride  5 mg Oral Daily  . insulin aspart  0-9 Units Subcutaneous TID WC  . insulin glargine  6 Units Subcutaneous Daily  . lisinopril  5 mg Oral Daily  . metoprolol  succinate  100 mg Oral Daily  . multivitamin with minerals  1 tablet Oral Daily  . pantoprazole  40 mg Oral Daily  . pravastatin  20 mg Oral QHS  . QUEtiapine  50 mg Oral QHS  . tamsulosin  0.4 mg Oral QHS  . thiamine  100 mg Oral Daily  . vitamin C  500 mg Oral Daily  . zinc sulfate  220 mg Oral Daily   Continuous Infusions: . lactated ringers with kcl 75 mL/hr at 08/24/19 0518     LOS: 9 days   Time spent: 25 minutes.  Patrecia Pour, MD [facility] [email or web address] 08/24/2019, 1:03 PM

## 2019-08-25 DIAGNOSIS — R638 Other symptoms and signs concerning food and fluid intake: Secondary | ICD-10-CM

## 2019-08-25 DIAGNOSIS — R410 Disorientation, unspecified: Secondary | ICD-10-CM

## 2019-08-25 DIAGNOSIS — J9601 Acute respiratory failure with hypoxia: Secondary | ICD-10-CM

## 2019-08-25 LAB — COMPREHENSIVE METABOLIC PANEL
ALT: 45 U/L — ABNORMAL HIGH (ref 0–44)
AST: 29 U/L (ref 15–41)
Albumin: 2.6 g/dL — ABNORMAL LOW (ref 3.5–5.0)
Alkaline Phosphatase: 66 U/L (ref 38–126)
Anion gap: 13 (ref 5–15)
BUN: 18 mg/dL (ref 8–23)
CO2: 24 mmol/L (ref 22–32)
Calcium: 8.7 mg/dL — ABNORMAL LOW (ref 8.9–10.3)
Chloride: 100 mmol/L (ref 98–111)
Creatinine, Ser: 0.7 mg/dL (ref 0.61–1.24)
GFR calc Af Amer: >60 mL/min (ref >60)
GFR calc non Af Amer: >60 mL/min (ref >60)
Glucose, Bld: 277 mg/dL — ABNORMAL HIGH (ref 70–99)
Potassium: 4.3 mmol/L (ref 3.5–5.1)
Sodium: 137 mmol/L (ref 135–145)
Total Bilirubin: 0.9 mg/dL (ref 0.3–1.2)
Total Protein: 5.7 g/dL — ABNORMAL LOW (ref 6.5–8.1)

## 2019-08-25 LAB — C-REACTIVE PROTEIN: CRP: 2 mg/dL — ABNORMAL HIGH (ref <1.0)

## 2019-08-25 LAB — GLUCOSE, CAPILLARY
Glucose-Capillary: 226 mg/dL — ABNORMAL HIGH (ref 70–99)
Glucose-Capillary: 209 mg/dL — ABNORMAL HIGH (ref 70–99)
Glucose-Capillary: 190 mg/dL — ABNORMAL HIGH (ref 70–99)
Glucose-Capillary: 199 mg/dL — ABNORMAL HIGH (ref 70–99)

## 2019-08-25 MED ORDER — LISINOPRIL 10 MG PO TABS
20.0000 mg | ORAL_TABLET | Freq: Every day | ORAL | Status: DC
  Administered 2019-08-26 – 2019-08-28 (×3): 20 mg via ORAL
  Filled 2019-08-25 (×4): qty 2

## 2019-08-25 NOTE — Progress Notes (Signed)
Palliative:  HPI: 85 y.o. male  with past medical history of HTN, HLD, CAD s/p PCI, diabetes, GERD, BPH admitted on 08/15/2019 with weakness, decreased intake, dry cough, lethargy r/t + COVID infection and found to have multifocal pneumonia. Sepsis and COVID pneumonia have improved but he continues with encephalopathy/delirium and ongoing dysphagia but has been initiated on dysphagia 1, honey thick liquid diet with poor intake. 12/2 out of restraints and diet upgraded.   I discussed status again today with RN. He continues to be confused but is now out of restraints. Intake of food/fluids no improvement. SLP notes upgrade in diet to dys 2, nectar and was more interactive and cooperative during session.   I had conference call with Mr. Lay wife, son, daughter. I reiterated concern with ongoing confusion and poor intake and what this will mean for overall prognosis and QOL. Family have good understanding that his baseline is much changed and unlikely to have significant improvement to return close to previous baseline. We discussed that artificial feeding is not recommended. We discussed taking Mr. [place] lead. I encouraged them to bring his hearing aide to see if this assists with his confusion. Discussed that we will monitor to see if he tolerates upgrade in diet any better. They do understand that they will be unable to care for him at home and that he will need placement (SNF vs hospice to be determined). The priority is on his comfort and QOL although they would like to see if he shows any further improvement over the next couple days.   All questions/concerns addressed. Family also ask about video visit and if they can visit in person. I explain that in person visit is unlikely but I will revert them to nursing for this. Emotional support provided.   Plan: - Family to bring in hearing aides and requesting assistance with tele visit with him. RN aware.  - Will continue to discuss with family GOC  and options depending on progress.   [place], NP Palliative Medicine Team Pager [phone] (Please see [email or web address] for schedule) Team Phone [phone]    Greater than 50%  of this time was spent counseling and coordinating care related to the above assessment and plan   The above conversation was completed via telephone due to the visitor restrictions during the COVID-19 pandemic. Thorough chart review and discussion with necessary members of the care team was completed as part of assessment. All issues were discussed and addressed but no physical exam was performed.

## 2019-08-25 NOTE — Progress Notes (Signed)
[facility] PROGRESS NOTE    Progress Note  Paul Terrell  [id number] DOB: [birth year] DOA: 08/15/2019 PCP: Tonia Ghent, MD     Brief Narrative:   Paul Terrell is an 85 y.o. male past medical history of diabetes mellitus type 2, essential hypertension, CAD status post PCI, BPH who presents from home on 08/15/2019 with several days of decreased oral intake, dry cough generalized weakness lethargy and difficulty walking, he tested positive for SARS-CoV-2 on 08/14/2019.  He relates his symptoms progressively got worse on the day of admission he fell out of bed with no loss of consciousness on arrival to the ED he was found to be tachycardic tachypneic with a fever of 105 mildly elevated high sensitive troponin to repeat SARS-CoV-2 was positive chest x-ray showed bilateral infiltrates.  CT scan of the head and cervical spine showed no acute findings.  Assessment/Plan:   Acute respiratory failure with hypoxia due to pneumonia due to COVID-19 virus/ Sepsis due to COVID-19 [facility]): He satting greater 94% on room air. Has completed his course of IV remdesivir and steroids. His inflammatory markers are improving.  Acute metabolic encephalopathy/acute confusional state: He has multiple risk factors for acute confusional state included but not limited to COVID-19 viral infection and steroids hospitalization, with chronic CVAs noted on neuroimaging. He is at high risk of aspiration try to keep the head of the bed elevated. Try to minimize restraint. Currently on Haldol at bedtime, continue vitamin C and zinc. With poor oral intake but he is positive about 3 L so we will KVO IV fluids. Continue to monitor oral intake.  Continue to document percentage of meal eaten.  Dysphagia: Patient at high risk of aspiration, speech was consulted and evaluated the patient and agreed. Palliative care has been consulted given poor prognosis and persistent symptoms.  Goals of care/palliative care  encounters: DNR has been confirmed, he remains fluid dependent. Palliative care evaluation is pending.  Transaminitis: Likely due to COVID-19 viral infection continue to improve we will stop checking them.  Essential hypertension: Blood pressure remains high, increase lisinopril continue metoprolol.  CAD status post PCI: Continue beta-blockers and statins.  Hyperlipidemia: Continue statins.  BPH: Continue finasteride and Flomax.  GERD: Continue PPI.  Anemia of chronic disease/normocytic anemia: Noted, on 08/23/2019 it was 13.9. We will check a CBC tomorrow.   DVT prophylaxis: lovenox Family Communication:daughter Disposition Plan/Barrier to D/C: Unable to determine Code Status:     Code Status Orders  (From admission, onward)         Start     Ordered   08/23/19 1420  Do not attempt resuscitation (DNR)  Continuous    Question Answer Comment  In the event of cardiac or respiratory ARREST Do not call a "code blue"   In the event of cardiac or respiratory ARREST Do not perform Intubation, CPR, defibrillation or ACLS   In the event of cardiac or respiratory ARREST Use medication by any route, position, wound care, and other measures to relive pain and suffering. May use oxygen, suction and manual treatment of airway obstruction as needed for comfort.      08/23/19 1420        Code Status History    Date Active Date Inactive Code Status Order ID Comments User Context   08/15/2019 1928 08/23/2019 1420 Full Code [id number]  Mckinley Jewel, MD ED   05/14/2018 1845 05/17/2018 2018 Full Code [id number]  Norval Morton, MD ED   Advance Care Planning Activity  IV Access:    Peripheral IV   Procedures and diagnostic studies:   No results found.   Medical Consultants:    None.  Anti-Infectives:   None  Subjective:    Paul Terrell pleasantly confused no complaints  Objective:    Vitals:   08/24/19 1946 08/25/19 0627 08/25/19 0700 08/25/19  0800  BP: (!) 177/96 (!) 185/93  (!) 179/70  Pulse: 69 83 61 61  Resp: 19 16 12 19   Temp: 98 F (36.7 C) 97.7 F (36.5 C)  97.6 F (36.4 C)  TempSrc: Oral Axillary  Oral  SpO2: 98% 100% 97% 96%  Weight:      Height:       SpO2: 96 %   Intake/Output Summary (Last 24 hours) at 08/25/2019 0841 Last data filed at 08/25/2019 [id number] Gross per 24 hour  Intake 1951.86 ml  Output 425 ml  Net 1526.86 ml   Filed Weights   08/18/19 1430  Weight: 69.6 kg    Exam: General exam: In no acute distress. Respiratory system: Good air movement and clear to auscultation. Cardiovascular system: S1 & S2 heard, RRR. No JVD. Gastrointestinal system: Abdomen is nondistended, soft and nontender.  Central nervous system: Alert and oriented. No focal neurological deficits. Extremities: No pedal edema. Skin: No rashes, lesions or ulcers Psychiatry: Pleasantly confused, poor insight.   Data Reviewed:    Labs: Basic Metabolic Panel: Recent Labs  Lab 08/19/19 0016  08/21/19 0058 08/22/19 0036 08/23/19 0225 08/24/19 0307 08/25/19 0005  NA 140   < > 138 139 137 138 137  K 3.4*   < > 3.4* 3.4* 3.9 3.8 4.3  CL 105   < > 103 105 100 102 100  CO2 23   < > 23 25 27 26 24   GLUCOSE 91   < > 282* 162* 234* 161* 277*  BUN 18   < > 19 16 17 14 18   CREATININE 0.73   < > 0.78 0.77 0.75 0.66 0.70  CALCIUM 8.7*   < > 8.5* 8.6* 8.5* 8.6* 8.7*  MG 1.8  --   --   --   --   --   --   PHOS 2.1*  --   --   --   --   --   --    < > = values in this interval not displayed.   GFR Estimated Creatinine Clearance: 55.5 mL/min (by C-G formula based on SCr of 0.7 mg/dL). Liver Function Tests: Recent Labs  Lab 08/21/19 0058 08/22/19 0036 08/23/19 0225 08/24/19 0307 08/25/19 0005  AST 58* 58* 40 34 29  ALT 74* 71* 60* 49* 45*  ALKPHOS 69 68 64 62 66  BILITOT 1.3* 1.1 1.4* 1.1 0.9  PROT 6.2* 5.9* 6.1* 5.4* 5.7*  ALBUMIN 3.0* 2.7* 2.8* 2.4* 2.6*   No results for input(s): LIPASE, AMYLASE in the last 168  hours. No results for input(s): AMMONIA in the last 168 hours. Coagulation profile No results for input(s): INR, PROTIME in the last 168 hours. COVID-19 Labs  Recent Labs    08/23/19 0225 08/25/19 0240  CRP 3.3* 2.0*    No results found for: SARSCOV2NAA  CBC: Recent Labs  Lab 08/19/19 0016 08/23/19 0225  WBC 8.9 7.4  NEUTROABS 6.3  --   HGB 12.8* 13.9  HCT 37.8* 42.8  MCV 86.9 90.9  PLT 257 311   Cardiac Enzymes: No results for input(s): CKTOTAL, CKMB, CKMBINDEX, TROPONINI in the last 168 hours. BNP (last  3 results) No results for input(s): PROBNP in the last 8760 hours. CBG: Recent Labs  Lab 08/23/19 1600 08/23/19 2126 08/24/19 0751 08/24/19 1202 08/24/19 2131  GLUCAP 192* 147* 185* 188* 262*   D-Dimer: No results for input(s): DDIMER in the last 72 hours. Hgb A1c: No results for input(s): HGBA1C in the last 72 hours. Lipid Profile: No results for input(s): CHOL, HDL, LDLCALC, TRIG, CHOLHDL, LDLDIRECT in the last 72 hours. Thyroid function studies: No results for input(s): TSH, T4TOTAL, T3FREE, THYROIDAB in the last 72 hours.  Invalid input(s): FREET3 Anemia work up: No results for input(s): VITAMINB12, FOLATE, FERRITIN, TIBC, IRON, RETICCTPCT in the last 72 hours. Sepsis Labs: Recent Labs  Lab 08/19/19 0016 08/23/19 0225  WBC 8.9 7.4   Microbiology Recent Results (from the past 240 hour(s))  Blood culture (routine x 2)     Status: None   Collection Time: 08/15/19  4:07 PM   Specimen: BLOOD  Result Value Ref Range Status   Specimen Description BLOOD LEFT ANTECUBITAL  Final   Special Requests   Final    AEROBIC BOTTLE ONLY Blood Culture results may not be optimal due to an inadequate volume of blood received in culture bottles   Culture   Final    NO GROWTH 5 DAYS Performed at [facility], [place] [address] [place], [place] [address]    Report Status 08/20/2019 FINAL  Final  Blood culture (routine x 2)     Status: None   Collection Time:  08/15/19  4:07 PM   Specimen: BLOOD  Result Value Ref Range Status   Specimen Description BLOOD RIGHT ANTECUBITAL  Final   Special Requests   Final    BOTTLES DRAWN AEROBIC AND ANAEROBIC Blood Culture adequate volume   Culture   Final    NO GROWTH 5 DAYS Performed at [facility], [place] [address] [place], [place] [address]    Report Status 08/20/2019 FINAL  Final  Urine culture     Status: Abnormal   Collection Time: 08/15/19  4:09 PM   Specimen: Urine, Random  Result Value Ref Range Status   Specimen Description URINE, RANDOM  Final   Special Requests   Final    NONE Performed at [facility], [place] [address], [place], [place] [address]    Culture MULTIPLE SPECIES PRESENT, SUGGEST RECOLLECTION (A)  Final   Report Status 08/16/2019 FINAL  Final     Medications:   . enoxaparin (LOVENOX) injection  40 mg Subcutaneous Q24H  . finasteride  5 mg Oral Daily  . insulin aspart  0-9 Units Subcutaneous TID WC  . insulin glargine  8 Units Subcutaneous Daily  . lisinopril  10 mg Oral Daily  . metoprolol succinate  100 mg Oral Daily  . multivitamin with minerals  1 tablet Oral Daily  . pantoprazole  40 mg Oral Daily  . pravastatin  20 mg Oral QHS  . QUEtiapine  50 mg Oral QHS  . tamsulosin  0.4 mg Oral QHS  . thiamine  100 mg Oral Daily  . vitamin C  500 mg Oral Daily  . zinc sulfate  220 mg Oral Daily   Continuous Infusions: . lactated ringers with kcl 75 mL/hr at 08/24/19 0518      LOS: 10 days   Charlynne Cousins  [facility]  08/25/2019, 8:41 AM

## 2019-08-25 NOTE — Progress Notes (Signed)
  Speech Language Pathology Treatment: Dysphagia  Patient Details Name: Paul Terrell MRN: [id number] DOB: 09/15/1931 Today's Date: 08/25/2019 Time: [id number] SLP Time Calculation (min) (ACUTE ONLY): 25 min  Assessment / Plan / Recommendation Clinical Impression  Pt demonstrates significant improvement in oral hygiene and attention to POs. SLP is able to communicate effectively with pt if short phrases and visual cues (head nods/shakes, gestures) are given. By end of session pt was able to participate in social conversation while also admitting he has trouble with his memory. Dentures also placed which made pt so happy. He was able to take careful straw sips of honey, nectar and thin water with SLP assistance for pacing and slow careful rate. Slight wet vocal quality noted at times. Trialed regular solids and pt did still have some difficulty forming the bolus given dry mouth and SLP provided liquid wash to clear. He is able to [facility] and with assistance for upright posture pt can attempt advance diet, dys 2 (finely chopped) and nectar thick liquids.   HPI        SLP Plan  Continue with current plan of care       Recommendations  Diet recommendations: Nectar thick liquid;Dysphagia 2 (fine chop) Liquids provided via: Cup;Straw Medication Administration: Crushed with puree Supervision: Staff to assist with self feeding;Full supervision/cueing for compensatory strategies Compensations: Slow rate;Small sips/bites Postural Changes and/or Swallow Maneuvers: Seated upright 90 degrees;Upright 30-60 min after meal                Follow up Recommendations: Skilled Nursing facility SLP Visit Diagnosis: Dysphagia, oropharyngeal phase (R13.12) Plan: Continue with current plan of care       GO               Herbie Baltimore, MA [place] Pager [phone] Office [phone]  Lynann Beaver 08/25/2019, 12:20 PM

## 2019-08-26 LAB — CBC
HCT: 38.4 % — ABNORMAL LOW (ref 39.0–52.0)
Hemoglobin: 12.4 g/dL — ABNORMAL LOW (ref 13.0–17.0)
MCH: 29.6 pg (ref 26.0–34.0)
MCHC: 32.3 g/dL (ref 30.0–36.0)
MCV: 91.6 fL (ref 80.0–100.0)
Platelets: 209 10*3/uL (ref 150–400)
RBC: 4.19 MIL/uL — ABNORMAL LOW (ref 4.22–5.81)
RDW: 15.5 % (ref 11.5–15.5)
WBC: 8.2 10*3/uL (ref 4.0–10.5)
nRBC: 0 % (ref 0.0–0.2)

## 2019-08-26 LAB — GLUCOSE, CAPILLARY
Glucose-Capillary: 271 mg/dL — ABNORMAL HIGH (ref 70–99)
Glucose-Capillary: 210 mg/dL — ABNORMAL HIGH (ref 70–99)
Glucose-Capillary: 236 mg/dL — ABNORMAL HIGH (ref 70–99)
Glucose-Capillary: 183 mg/dL — ABNORMAL HIGH (ref 70–99)
Glucose-Capillary: 204 mg/dL — ABNORMAL HIGH (ref 70–99)

## 2019-08-26 NOTE — Progress Notes (Signed)
Occupational Therapy Treatment Patient Details Name: Paul Terrell MRN: [id number] DOB: 02/03/1931 Today's Date: 08/26/2019    History of present illness 85 year old male who presented with altered mental status.  He does have significant past medical history for hypertension, type 2 diabetes mellitus, dyslipidemia, coronary artery disease, BPH and GERD.  Patient was noted to have several days of decreased p.o. intake, dry cough, generalized weakness, lethargy and difficulty walking.  He tested positive for COVID-19 November 21.  On the day of admission his symptoms were worse, he fell out of bed with no loss of consciousness. admitted to the hospital with working diagnosis of SARS COVID-19 viral pneumonia. Systemic inflammatory response syndrome with endorgan failure, metabolic encephalopathy, sepsis present on admission, due to viral pneumonia.   OT comments  Pt has hearing aids, but continues to have extreme difficulty hearing. Used written communication. Pt required total A to clean periarea after small incontinence episode. Offered BSC but pt declined need. Completed grooming with set up in sitting.  Pt ambulated in room to door. After resting, pt ambulated out in hallway with min A @ RW level with nurse following with chair. Pt ambulated @ 200 ft with 2 standing rest breaks. Pt stopping occasionally to wave to staff. Face timed family at end of session however do not feel pt could hear them very well. Pt left in recliner with seat belt alarm in place. Nsg educated on use. Continue to recommend rehab at [facility]. Recommend nursing mobilize pt OOB to chair daily and use seat belt alarm to prevent falls.   Follow Up Recommendations  SNF;Supervision/Assistance - 24 hour    Equipment Recommendations  None recommended by OT    Recommendations for Other Services      Precautions / Restrictions Precautions Precautions: Fall       Mobility Bed Mobility Overal bed mobility: Needs Assistance Bed  Mobility: Supine to Sit     Supine to sit: Mod assist     General bed mobility comments: pt innitiating moving feet off bed;   Transfers Overall transfer level: Needs assistance Equipment used: Rolling walker (2 wheeled) Transfers: Sit to/from [facility] Sit to Stand: Mod assist              Balance     Sitting balance-Leahy Scale: Good       Standing balance-Leahy Scale: Poor                             ADL either performed or assessed with clinical judgement   ADL Overall ADL's : Needs assistance/impaired Eating/Feeding: Sitting;Minimal assistance Eating/Feeding Details (indicate cue type and reason): cup placed in hand; pt with ability to bring cup to mouth to appropriately drink; Continues t Grooming: Minimal assistance;Sitting;Oral care;Wash/dry face                         Toileting - Clothing Manipulation Details (indicate cue type and reason): pt had small BM in bed; total A to clean     Functional mobility during ADLs: Moderate assistance;Rolling walker       Vision   Additional Comments: wears glasses but not in room   Perception     Praxis      Cognition Arousal/Alertness: Awake/alert Behavior During Therapy: WFL for tasks assessed/performed Overall Cognitive Status: Impaired/Different from baseline  Orientation Level: Time;Situation       Safety/Judgement: Decreased awareness of safety;Decreased awareness of deficits Awareness: Emergent   General Comments: Pt very HOH and wears glasses; face timed family at end of session; do not feel he could see or hear his family; very appropirate during session        Exercises     Shoulder Instructions       General Comments Pt ambulated @ 200 ft in hallway with RW    Pertinent Vitals/ Pain       Pain Assessment: Faces Faces Pain Scale: No hurt  Home Living                                          Prior  Functioning/Environment              Frequency  Min 2X/week        Progress Toward Goals  OT Goals(current goals can now be found in the care plan section)  Progress towards OT goals: Progressing toward goals  Acute Rehab OT Goals Patient Stated Goal: to get stronger Time For Goal Achievement: 09/03/19 Potential to Achieve Goals: Fair ADL Goals Pt Will Perform Eating: with set-up;sitting Pt Will Perform Grooming: with set-up;sitting Pt Will Transfer to Toilet: with min assist;bedside commode Pt Will Perform Toileting - Clothing Manipulation and hygiene: with min assist;sitting/lateral leans;sit to/from stand Additional ADL Goal #1: Pt to tolerate sitting EOB 10 min with supervision, in preparation for ADLs and transfers.  Plan Discharge plan remains appropriate    Co-evaluation                 AM-PAC OT "6 Clicks" Daily Activity     Outcome Measure   Help from another person eating meals?: A Little Help from another person taking care of personal grooming?: A Little Help from another person toileting, which includes using toliet, bedpan, or urinal?: A Lot Help from another person bathing (including washing, rinsing, drying)?: A Lot Help from another person to put on and taking off regular upper body clothing?: A Lot Help from another person to put on and taking off regular lower body clothing?: A Lot 6 Click Score: 14    End of Session Equipment Utilized During Treatment: Rolling walker;Gait belt  OT Visit Diagnosis: Unsteadiness on feet (R26.81);Muscle weakness (generalized) (M62.81)   Activity Tolerance Patient tolerated treatment well   Patient Left in chair;with call bell/phone within reach;with chair alarm set   Nurse Communication Mobility status;Other (comment)(use of [place]y seat belt alarm)        Time: 1500-1550 OT Time Calculation (min): 50 min  Charges: OT General Charges $OT Visit: 1 Visit OT Treatments $Self Care/Home Management :  38-52 mins  Maurie Boettcher, OT/L   Acute OT Clinical Specialist [place] Pager [phone] Office [phone]    [facility] 08/26/2019, 4:47 PM

## 2019-08-26 NOTE — Progress Notes (Signed)
  Speech Language Pathology Treatment: Dysphagia  Patient Details Name: Paul Terrell MRN: [id number] DOB: 04/05/1931 Today's Date: 08/26/2019 Time: [id number] SLP Time Calculation (min) (ACUTE ONLY): 16 min  Assessment / Plan / Recommendation Clinical Impression  Pt ate a small amount of his breakfast tray, initially feeding himself but becoming fatigued and taking in more when given assistance. Other than a delayed throat clear he had no overt s/s of aspiration. He clears his mouth well with his dentures in place and although I think he would be capable of masticating more solid foods, I think his current diet conserves his energy better for now. SLP discussed with nursing the importance of offering POs more frequently throughout the day to try to maximize intake. SLP will continue to follow with potential for diet upgrade if we see improvements in mentation and/or overall endurance.    HPI HPI: 85 y.o. male with medical history significant of hypertension, hyperlipidemia, diabetes, coronary artery disease status post stents, BPH, GERD, HOH presents to emergency department due to increased confusion over several days, Dx pna, sepsis, COVID +, metabolic encephalopathy. Pt evaluated on 11/24 by SLP, swallow WNL, but mental status has deteriorated since then and MD requested reeval.       SLP Plan  Continue with current plan of care       Recommendations  Diet recommendations: Dysphagia 2 (fine chop);Nectar-thick liquid Liquids provided via: Cup;Straw Medication Administration: Crushed with puree Supervision: Staff to assist with self feeding;Full supervision/cueing for compensatory strategies Compensations: Slow rate;Small sips/bites Postural Changes and/or Swallow Maneuvers: Seated upright 90 degrees;Upright 30-60 min after meal                Oral Care Recommendations: Oral care QID Follow up Recommendations: Skilled Nursing facility SLP Visit Diagnosis: Dysphagia, oropharyngeal  phase (R13.12) Plan: Continue with current plan of care       GO                Venita Sheffield Jancy Sprankle 08/26/2019, 9:41 AM  Pollyann Glen, M.A. [place] Acute Environmental education officer [phone] Office [phone]

## 2019-08-26 NOTE — Progress Notes (Signed)
[facility] PROGRESS NOTE    Progress Note  Paul Terrell  [id number] DOB: 02-28-1931 DOA: 08/15/2019 PCP: Tonia Ghent, MD     Brief Narrative:   Paul Terrell is an 85 y.o. male past medical history of diabetes mellitus type 2, essential hypertension, CAD status post PCI, BPH who presents from home on 08/15/2019 with several days of decreased oral intake, dry cough generalized weakness lethargy and difficulty walking, he tested positive for SARS-CoV-2 on 08/14/2019.  He relates his symptoms progressively got worse on the day of admission he fell out of bed with no loss of consciousness on arrival to the ED he was found to be tachycardic tachypneic with a fever of 105 mildly elevated high sensitive troponin to repeat SARS-CoV-2 was positive chest x-ray showed bilateral infiltrates.  CT scan of the head and cervical spine showed no acute findings.  Assessment/Plan:   Acute respiratory failure with hypoxia due to pneumonia due to COVID-19 virus/ Sepsis due to COVID-19 [facility]): Satting greater than 94% on room air. He has complete his course of IV remdesivir and steroids, his inflammatory markers are improved.  Acute metabolic encephalopathy/acute confusional state: He is at significant risk of aspiration and decompensating. His multiple risk factors for acute confusion is date included but not limited to age, COVID-38, steroids hospitalization and chronic ischemic changes on neuroimaging. Try to keep the bed elevated, try to minimize restraint. Continue Haldol as needed and vitamin C and zinc. He continues to have poor oral intake he is IV fluids dependent.  Dysphagia: Appreciate speech assistance, he was reevaluated on 08/25/2019 showed a dysphagia 2 diet. Patient is at high risk of aspiration.  Goals of care/palliative care encounters: DNR has been confirmed, he remains fluid dependent. Palliative Care is working with family they are to bring their hearing aid in requesting  consistent with telemetry conversation with the patient.  Transaminitis: Likely due to COVID-19 viral infection continue to improve we will stop checking them.  Essential hypertension: Blood pressure remains high, increase lisinopril continue metoprolol.  CAD status post PCI: Continue beta-blockers and statins.  Hyperlipidemia: Continue statins.  BPH: Continue finasteride and Flomax.  GERD: Continue PPI.  Anemia of chronic disease/normocytic anemia: Noted, on 08/23/2019 it was 13.9. We will check a CBC tomorrow.   DVT prophylaxis: lovenox Family Communication:daughter Disposition Plan/Barrier to D/C: Unable to determine Code Status:     Code Status Orders  (From admission, onward)         Start     Ordered   08/23/19 1420  Do not attempt resuscitation (DNR)  Continuous    Question Answer Comment  In the event of cardiac or respiratory ARREST Do not call a "code blue"   In the event of cardiac or respiratory ARREST Do not perform Intubation, CPR, defibrillation or ACLS   In the event of cardiac or respiratory ARREST Use medication by any route, position, wound care, and other measures to relive pain and suffering. May use oxygen, suction and manual treatment of airway obstruction as needed for comfort.      08/23/19 1420        Code Status History    Date Active Date Inactive Code Status Order ID Comments User Context   08/15/2019 1928 08/23/2019 1420 Full Code [id number]  Mckinley Jewel, MD ED   05/14/2018 1845 05/17/2018 2018 Full Code [id number]  Norval Morton, MD ED   Advance Care Planning Activity        IV Access:  Peripheral IV   Procedures and diagnostic studies:   No results found.   Medical Consultants:    None.  Anti-Infectives:   None  Subjective:    WHIT TONES pleasantly confused no complaints  Objective:    Vitals:   08/25/19 0800 08/25/19 1624 08/25/19 2000 08/26/19 0432  BP: (!) 179/70 (!) 170/70 (!) 176/75 (!)  152/91  Pulse: 61  66 60  Resp: 19 16 18 18   Temp: 97.6 F (36.4 C) 98.5 F (36.9 C) 97.6 F (36.4 C) 97.7 F (36.5 C)  TempSrc: Oral Oral Oral Axillary  SpO2: 96%  93% 95%  Weight:      Height:       SpO2: 95 %   Intake/Output Summary (Last 24 hours) at 08/26/2019 0828 Last data filed at 08/26/2019 0300 Gross per 24 hour  Intake 180 ml  Output 550 ml  Net -370 ml   Filed Weights   08/18/19 1430  Weight: 69.6 kg    Exam: General exam: In no acute distress. Respiratory system: Good air movement and clear to auscultation. Cardiovascular system: S1 & S2 heard, RRR. No JVD. Gastrointestinal system: Abdomen is nondistended, soft and nontender.  Central nervous system: Alert and oriented. No focal neurological deficits. Extremities: No pedal edema. Skin: No rashes, lesions or ulcers Psychiatry: Pleasantly confused, poor insight.   Data Reviewed:    Labs: Basic Metabolic Panel: Recent Labs  Lab 08/21/19 0058 08/22/19 0036 08/23/19 0225 08/24/19 0307 08/25/19 0005  NA 138 139 137 138 137  K 3.4* 3.4* 3.9 3.8 4.3  CL 103 105 100 102 100  CO2 23 25 27 26 24   GLUCOSE 282* 162* 234* 161* 277*  BUN 19 16 17 14 18   CREATININE 0.78 0.77 0.75 0.66 0.70  CALCIUM 8.5* 8.6* 8.5* 8.6* 8.7*   GFR Estimated Creatinine Clearance: 55.5 mL/min (by C-G formula based on SCr of 0.7 mg/dL). Liver Function Tests: Recent Labs  Lab 08/21/19 0058 08/22/19 0036 08/23/19 0225 08/24/19 0307 08/25/19 0005  AST 58* 58* 40 34 29  ALT 74* 71* 60* 49* 45*  ALKPHOS 69 68 64 62 66  BILITOT 1.3* 1.1 1.4* 1.1 0.9  PROT 6.2* 5.9* 6.1* 5.4* 5.7*  ALBUMIN 3.0* 2.7* 2.8* 2.4* 2.6*   No results for input(s): LIPASE, AMYLASE in the last 168 hours. No results for input(s): AMMONIA in the last 168 hours. Coagulation profile No results for input(s): INR, PROTIME in the last 168 hours. COVID-19 Labs  Recent Labs    08/25/19 0240  CRP 2.0*    No results found for: SARSCOV2NAA  CBC:  Recent Labs  Lab 08/23/19 0225 08/26/19 0004  WBC 7.4 8.2  HGB 13.9 12.4*  HCT 42.8 38.4*  MCV 90.9 91.6  PLT 311 209   Cardiac Enzymes: No results for input(s): CKTOTAL, CKMB, CKMBINDEX, TROPONINI in the last 168 hours. BNP (last 3 results) No results for input(s): PROBNP in the last 8760 hours. CBG: Recent Labs  Lab 08/24/19 1618 08/24/19 2131 08/25/19 0830 08/25/19 1124 08/25/19 1614  GLUCAP 226* 262* 209* 190* 199*   D-Dimer: No results for input(s): DDIMER in the last 72 hours. Hgb A1c: No results for input(s): HGBA1C in the last 72 hours. Lipid Profile: No results for input(s): CHOL, HDL, LDLCALC, TRIG, CHOLHDL, LDLDIRECT in the last 72 hours. Thyroid function studies: No results for input(s): TSH, T4TOTAL, T3FREE, THYROIDAB in the last 72 hours.  Invalid input(s): FREET3 Anemia work up: No results for input(s): VITAMINB12, FOLATE, FERRITIN,  TIBC, IRON, RETICCTPCT in the last 72 hours. Sepsis Labs: Recent Labs  Lab 08/23/19 0225 08/26/19 0004  WBC 7.4 8.2   Microbiology No results found for this or any previous visit (from the past 240 hour(s)).   Medications:   . enoxaparin (LOVENOX) injection  40 mg Subcutaneous Q24H  . finasteride  5 mg Oral Daily  . insulin aspart  0-9 Units Subcutaneous TID WC  . insulin glargine  8 Units Subcutaneous Daily  . lisinopril  20 mg Oral Daily  . metoprolol succinate  100 mg Oral Daily  . multivitamin with minerals  1 tablet Oral Daily  . pantoprazole  40 mg Oral Daily  . pravastatin  20 mg Oral QHS  . QUEtiapine  50 mg Oral QHS  . tamsulosin  0.4 mg Oral QHS  . thiamine  100 mg Oral Daily  . vitamin C  500 mg Oral Daily  . zinc sulfate  220 mg Oral Daily   Continuous Infusions:     LOS: 11 days   Charlynne Cousins  [facility]  08/26/2019, 8:28 AM

## 2019-08-26 NOTE — Progress Notes (Signed)
Hearing aids brought in by son. Placed in both ears by nurse tech.

## 2019-08-26 NOTE — Progress Notes (Signed)
Palliative:  Communicated with RN and patient has received his hearing aides and appears less confused per her assessment. She assisted with video chat with family which was helpful to all. Reportedly drank 2 cups of juice after video chat. Will follow progress and touch base with family tomorrow.   No charge  Vinie Sill, NP Palliative Medicine Team Pager [phone] (Please see [email or web address] for schedule) Team Phone [phone]

## 2019-08-26 NOTE — Progress Notes (Signed)
Video chat done with family and patient very excited to see them.

## 2019-08-27 LAB — GLUCOSE, CAPILLARY
Glucose-Capillary: 239 mg/dL — ABNORMAL HIGH (ref 70–99)
Glucose-Capillary: 133 mg/dL — ABNORMAL HIGH (ref 70–99)
Glucose-Capillary: 150 mg/dL — ABNORMAL HIGH (ref 70–99)

## 2019-08-27 NOTE — Progress Notes (Signed)
Physical Therapy Treatment Patient Details Name: Paul Terrell MRN: [id number] DOB: 1931-06-19 Today's Date: 08/27/2019    History of Present Illness Pt is an 85 y.o. male admitted 08/15/19 with AMS and fall out of bed. Head and cervical imaging negative for acute abnormality. Worked up for metabolic encephalopathy and sepsis with COVID-19 viral PNA. PMH includes HTN, DM2, CAD.   PT Comments    Pt progressing with mobility. Slow, unsteady gait with RW, pt requiring up to minA for mobility and unable to accept challenge to balance; this and slow gait speed place pt at high risk for falls. Very pleasant and agreeable to participate. Requires frequent redirection to task; cognitive impairment likely exacerbated by pt very HOH. Continue to recommend SNF-level therapies to maximize functional mobility and independence prior to return home.   Follow Up Recommendations  SNF;Supervision/Assistance - 24 hour     Equipment Recommendations  None recommended by PT    Recommendations for Other Services       Precautions / Restrictions Precautions Precautions: Fall Precaution Comments: Very HOH Restrictions Weight Bearing Restrictions: No    Mobility  Bed Mobility Overal bed mobility: Needs Assistance Bed Mobility: Sit to Supine       Sit to supine: Min assist   General bed mobility comments: max cues to return to supine, minA to assist BLEs  Transfers                 General transfer comment: Received standing in room  Ambulation/Gait Ambulation/Gait assistance: Min guard Gait Distance (Feet): 80 Feet Assistive device: 1 person hand held assist;Rolling walker (2 wheeled) Gait Pattern/deviations: Shuffle;Trunk flexed;Wide base of support   Gait velocity interpretation: <1.31 ft/sec, indicative of household ambulator General Gait Details: Initial ambulation with HHA, switched to RW, ambulating laps in room with RW and close min guard for balance; pt requiring frequent  redirection to task   Stairs             Wheelchair Mobility    Modified Rankin (Stroke Patients Only)       Balance Overall balance assessment: Needs assistance   Sitting balance-Leahy Scale: Good       Standing balance-Leahy Scale: Fair Standing balance comment: Can static stand without UE support, reliant on UE support for dynamic stability                            Cognition Arousal/Alertness: Awake/alert Behavior During Therapy: WFL for tasks assessed/performed Overall Cognitive Status: Impaired/Different from baseline Area of Impairment: Orientation;Attention;Memory;Following commands;Safety/judgement;Awareness;Problem solving                 Orientation Level: Disoriented to;Time;Situation Current Attention Level: Sustained Memory: Decreased short-term memory Following Commands: Follows one step commands inconsistently Safety/Judgement: Decreased awareness of safety;Decreased awareness of deficits Awareness: Intellectual Problem Solving: Requires verbal cues;Requires tactile cues;Difficulty sequencing;Decreased initiation;Slow processing General Comments: Cognitive impairment likely exacerbated by pt very HOH. Received standing in middle of room having removed gown and condom catheter; able to be redirected and pt apologizing for mess having lost his hearing aid      Exercises      General Comments        Pertinent Vitals/Pain Pain Assessment: Faces Faces Pain Scale: No hurt Pain Intervention(s): Monitored during session    Home Living                      Prior Function  PT Goals (current goals can now be found in the care plan section) Progress towards PT goals: Progressing toward goals    Frequency    Min 2X/week      PT Plan Current plan remains appropriate    Co-evaluation              AM-PAC PT "6 Clicks" Mobility   Outcome Measure  Help needed turning from your back to your side  while in a flat bed without using bedrails?: A Little Help needed moving from lying on your back to sitting on the side of a flat bed without using bedrails?: A Little Help needed moving to and from a bed to a chair (including a wheelchair)?: A Little Help needed standing up from a chair using your arms (e.g., wheelchair or bedside chair)?: A Little Help needed to walk in hospital room?: A Little Help needed climbing 3-5 steps with a railing? : A Lot 6 Click Score: 17    End of Session   Activity Tolerance: Patient tolerated treatment well Patient left: in bed;with call bell/phone within reach;with bed alarm set Nurse Communication: Mobility status PT Visit Diagnosis: Unsteadiness on feet (R26.81);Other abnormalities of gait and mobility (R26.89);Muscle weakness (generalized) (M62.81)     Time: [id number] PT Time Calculation (min) (ACUTE ONLY): 24 min  Charges:  $Gait Training: 8-22 mins $Therapeutic Activity: 8-22 mins                    Mabeline Caras, PT, DPT Acute Rehabilitation Services  Pager [phone] Office [place] 08/27/2019, 5:23 PM

## 2019-08-27 NOTE — TOC Initial Note (Signed)
Transition of Care [facility]) - Initial/Assessment Note    Patient Details  Name: Paul Terrell MRN: [id number] Date of Birth: [birth year]  Transition of Care [facility]) CM/SW Contact:    Midge Minium MSN, RN, NCM-BC, ACM-RN [phone] Phone Number: 08/27/2019, 12:20 PM  Clinical Narrative:                 CM following for transitional needs. CM spoke to the patients spouse with identity confirmed, to discuss the POC. Patient is an 85 year old male who presented with altered mental status; COVID +, followed by Palliative for GOC. PT/OT eval completed with SNF recommended. CM discussed the recommendations with the spouse and explained the SNF process with COVID patients, with the spouse verbalizing understanding. Spouse is agreeable to ST SNF; requesting referral be sent to [facility] and [facility]. Referral sent, awaiting bed offers. Spouse request CM team discuss any bed offers and facilities with her son, Dara Lomanto. CM team will continue to follow.   Expected Discharge Plan: Skilled Nursing Facility Barriers to Discharge: Continued Medical Work up, SNF Pending bed offer   Patient Goals and CMS Choice   CMS Medicare.gov Compare Post Acute Care list provided to:: Patient Represenative (must comment)(Mildred Elena (spouse)) Choice offered to / list presented to : Spouse(Mildred Trefry (spouse))  Expected Discharge Plan and Services Expected Discharge Plan: [place] In-house Referral: Clinical Social Work Discharge Planning Services: CM Consult Post Acute Care Choice: [place] arrangements for the past 2 months: Single Family Home                 DME Arranged: N/A DME Agency: NA       HH Arranged: NA [place] Agency: NA        Prior Living Arrangements/Services Living arrangements for the past 2 months: [place] Lives with:: Self, Spouse Patient language and need for interpreter reviewed:: Yes Do you feel safe going back to the place  where you live?: Yes      Need for Family Participation in Patient Care: Yes (Comment) Care giver support system in place?: Yes (comment) Current home services: DME Criminal Activity/Legal Involvement Pertinent to Current Situation/Hospitalization: No - Comment as needed  Activities of Daily Living      Permission Sought/Granted Permission sought to share information with : Case Manager, Customer service manager, Family Supports Permission granted to share information with : Yes, Verbal Permission Granted  Share Information with NAME: Johntay Brandes  Permission granted to share info w AGENCY: SNF facilities  Permission granted to share info w Relationship: son  Permission granted to share info w Contact Information: [phone]  Emotional Assessment       Orientation: : Oriented to Self Alcohol / Substance Use: Not Applicable Psych Involvement: No (comment)  Admission diagnosis:  Shortness of breath [R06.02] Cough [R05] Confusion [R41.0] Abrasion [T14.8XXA] Fall from bed, initial encounter [W06.XXXA] COVID-19 [id number] Acute metabolic encephalopathy [id number] Patient Active Problem List   Diagnosis Date Noted  . Acute respiratory failure with hypoxia ([place]) 08/25/2019  . Confusion   . Poor fluid intake   . Acute metabolic encephalopathy [id number]  . Pneumonia due to COVID-19 virus 08/15/2019  . Sepsis due to COVID-19 ([place]) 08/15/2019  . GERD (gastroesophageal reflux disease)   . Thrombocytopenia ([place])   . Elevated liver enzymes   . Abnormal gait 11/22/2018  . Skin tear of elbow without complication [id number]  . Anemia 05/14/2018  . Hypertension 05/14/2018  . Unsteadiness on feet 11/05/2017  . Spinal  stenosis of lumbar region 11/08/2015  . Advance care planning 11/04/2014  . Leg pain 01/30/2014  . Medicare annual wellness visit, subsequent 11/01/2013  . BPH (benign prostatic hyperplasia)   . Diabetes mellitus type II   . Hyperlipidemia   . Hypertensive heart  disease   . CAD (coronary artery disease)    PCP:  Tonia Ghent, MD Pharmacy:   [facility] [address] [place] [address] Phone: [phone] Fax: [phone]     Social Determinants of Health (SDOH) Interventions    Readmission Risk Interventions No flowsheet data found.

## 2019-08-27 NOTE — Plan of Care (Signed)

## 2019-08-27 NOTE — Progress Notes (Signed)
Palliative:  HPI: 85 y.o.malewith past medical history of HTN, HLD, CAD s/p PCI, diabetes, GERD, BPHadmitted on 11/22/2020with weakness, decreased intake, dry cough, lethargy r/t + COVID infection and found to have multifocal pneumonia.Sepsis and COVID pneumonia have improved but he continues with encephalopathy/delirium and ongoing dysphagia but has been initiated on dysphagia 1, honey thick liquid dietwith poor intake.12/2 out of restraints and diet upgraded. 12/4 Confusion and intake is much improved. Looking at transition to SNF in the next few days.   I have discussed with RN today and Mr. Hoage appears much improved today. Much improvement in confusion and eating ~50% of his meals!! No further concerns and he remains stable.   I called and spoke with son, [name]. [name] and his family are very excited that they are finally seeing some improvement. [name] says that they had began to lose hope but yesterday afternoon during the video chat everything seemed to turn around. [name] understands that his father still has a long road ahead and that he will not return to previous baseline but they are happy with any improvement and time they are able to interact with him. He understands that his father could change and decline at any time as well but they will continue to value and cherish any time and interactions they can have with him. Looking forward to transition to SNF with hopes of some window or outside visits eventually.   All questions/concerns addressed. Emotional support provided.   Plan: - SNF rehab with outpatient palliative to follow for support.  - Please call for decline or acute palliative needs [id number].   15 min  Vinie Sill, NP Palliative Medicine Team Pager [phone] (Please see [email or web address] for schedule) Team Phone [phone]    Greater than 50%  of this time was spent counseling and coordinating care related to the above assessment and plan   The above  conversation was completed via telephone due to the visitor restrictions during the COVID-19 pandemic. Thorough chart review and discussion with necessary members of the care team was completed as part of assessment. All issues were discussed and addressed but no physical exam was performed.

## 2019-08-27 NOTE — Progress Notes (Signed)
Called wife and gave update on patient status.

## 2019-08-27 NOTE — Progress Notes (Addendum)
[facility] PROGRESS NOTE    Progress Note  Paul Terrell  [id number] DOB: [birth year] DOA: 08/15/2019 PCP: Tonia Ghent, MD     Brief Narrative:   Paul Terrell is an 85 y.o. male past medical history of diabetes mellitus type 2, essential hypertension, CAD status post PCI, BPH who presents from home on 08/15/2019 with several days of decreased oral intake, dry cough generalized weakness lethargy and difficulty walking, he tested positive for SARS-CoV-2 on 08/14/2019.  He relates his symptoms progressively got worse on the day of admission he fell out of bed with no loss of consciousness on arrival to the ED he was found to be tachycardic tachypneic with a fever of 105 mildly elevated high sensitive troponin to repeat SARS-CoV-2 was positive chest x-ray showed bilateral infiltrates.  CT scan of the head and cervical spine showed no acute findings.  Assessment/Plan:   Acute respiratory failure with hypoxia due to pneumonia due to COVID-19 virus/ Sepsis due to COVID-19 [facility]): Satting greater than 94% on room air. He has complete his course of IV remdesivir and steroids, his inflammatory markers are improved.  Acute metabolic encephalopathy/acute confusional state: We stopped his IV fluids yesterday, this morning when I went into the room he was more awake talking and having conversation he said he was hungry and he ate about 50% to 75 of his food. We will continue to watch him for 1 additional day he should be stable enough to go to skilled nursing facility tomorrow morning.  Dysphagia: Evaluated by speech recommended a dysphagia 2 diet, he is eating well for fevers sent to 75% of his food. Patient is at high risk of aspiration.  Goals of care/palliative care encounters: DNR has been confirmed, he remains fluid dependent. Palliative Care to continue to follow the family and patient at skilled nursing facility.  Transaminitis: Likely due to COVID-19 viral infection continue  to improve we will stop checking them.  Essential hypertension: Blood pressure remains high, increase lisinopril continue metoprolol.  CAD status post PCI: Continue beta-blockers and statins.  Hyperlipidemia: Continue statins.  BPH: Continue finasteride and Flomax.  GERD: Continue PPI.  Anemia of chronic disease/normocytic anemia: Noted, on 08/23/2019 it was 13.9.   DVT prophylaxis: lovenox Family Communication:daughter Disposition Plan/Barrier to D/C: Unable to determine Code Status:     Code Status Orders  (From admission, onward)         Start     Ordered   08/23/19 1420  Do not attempt resuscitation (DNR)  Continuous    Question Answer Comment  In the event of cardiac or respiratory ARREST Do not call a "code blue"   In the event of cardiac or respiratory ARREST Do not perform Intubation, CPR, defibrillation or ACLS   In the event of cardiac or respiratory ARREST Use medication by any route, position, wound care, and other measures to relive pain and suffering. May use oxygen, suction and manual treatment of airway obstruction as needed for comfort.      08/23/19 1420        Code Status History    Date Active Date Inactive Code Status Order ID Comments User Context   08/15/2019 1928 08/23/2019 1420 Full Code [id number]  Mckinley Jewel, MD ED   05/14/2018 1845 05/17/2018 2018 Full Code [id number]  Norval Morton, MD ED   Advance Care Planning Activity        IV Access:    Peripheral IV   Procedures and diagnostic studies:  No results found.   Medical Consultants:    None.  Anti-Infectives:   None  Subjective:    Paul Terrell very chatty this morning relates that he is hungry and he would like to eat he does not mean to be disrespectful.  Objective:    Vitals:   08/26/19 0432 08/26/19 0800 08/26/19 1938 08/27/19 0815  BP: (!) 152/91 (!) 143/72 (!) 142/51 130/69  Pulse: 60 (!) 53 60 66  Resp: 18  (!) 22   Temp: 97.7 F (36.5 C)   (!) 97.3 F (36.3 C)   TempSrc: Axillary  Axillary   SpO2: 95%  98% 95%  Weight:      Height:       SpO2: 95 %   Intake/Output Summary (Last 24 hours) at 08/27/2019 0956 Last data filed at 08/27/2019 0930 Gross per 24 hour  Intake 1735 ml  Output -  Net 1735 ml   Filed Weights   08/18/19 1430  Weight: 69.6 kg    Exam: General exam: In no acute distress. Respiratory system: Good air movement and clear to auscultation. Cardiovascular system: S1 & S2 heard, RRR. No JVD. Gastrointestinal system: Abdomen is nondistended, soft and nontender.  Central nervous system: Alert and oriented. No focal neurological deficits. Extremities: No pedal edema. Skin: No rashes, lesions or ulcers Psychiatry: Judgement and insight appear normal. Mood & affect appropriate.    Data Reviewed:    Labs: Basic Metabolic Panel: Recent Labs  Lab 08/21/19 0058 08/22/19 0036 08/23/19 0225 08/24/19 0307 08/25/19 0005  NA 138 139 137 138 137  K 3.4* 3.4* 3.9 3.8 4.3  CL 103 105 100 102 100  CO2 23 25 27 26 24   GLUCOSE 282* 162* 234* 161* 277*  BUN 19 16 17 14 18   CREATININE 0.78 0.77 0.75 0.66 0.70  CALCIUM 8.5* 8.6* 8.5* 8.6* 8.7*   GFR Estimated Creatinine Clearance: 55.5 mL/min (by C-G formula based on SCr of 0.7 mg/dL). Liver Function Tests: Recent Labs  Lab 08/21/19 0058 08/22/19 0036 08/23/19 0225 08/24/19 0307 08/25/19 0005  AST 58* 58* 40 34 29  ALT 74* 71* 60* 49* 45*  ALKPHOS 69 68 64 62 66  BILITOT 1.3* 1.1 1.4* 1.1 0.9  PROT 6.2* 5.9* 6.1* 5.4* 5.7*  ALBUMIN 3.0* 2.7* 2.8* 2.4* 2.6*   No results for input(s): LIPASE, AMYLASE in the last 168 hours. No results for input(s): AMMONIA in the last 168 hours. Coagulation profile No results for input(s): INR, PROTIME in the last 168 hours. COVID-19 Labs  Recent Labs    08/25/19 0240  CRP 2.0*    No results found for: SARSCOV2NAA  CBC: Recent Labs  Lab 08/23/19 0225 08/26/19 0004  WBC 7.4 8.2  HGB 13.9 12.4*   HCT 42.8 38.4*  MCV 90.9 91.6  PLT 311 209   Cardiac Enzymes: No results for input(s): CKTOTAL, CKMB, CKMBINDEX, TROPONINI in the last 168 hours. BNP (last 3 results) No results for input(s): PROBNP in the last 8760 hours. CBG: Recent Labs  Lab 08/25/19 2120 08/26/19 0803 08/26/19 1218 08/26/19 1638 08/26/19 2018  GLUCAP 271* 210* 236* 183* 204*   D-Dimer: No results for input(s): DDIMER in the last 72 hours. Hgb A1c: No results for input(s): HGBA1C in the last 72 hours. Lipid Profile: No results for input(s): CHOL, HDL, LDLCALC, TRIG, CHOLHDL, LDLDIRECT in the last 72 hours. Thyroid function studies: No results for input(s): TSH, T4TOTAL, T3FREE, THYROIDAB in the last 72 hours.  Invalid input(s): FREET3 Anemia  work up: No results for input(s): VITAMINB12, FOLATE, FERRITIN, TIBC, IRON, RETICCTPCT in the last 72 hours. Sepsis Labs: Recent Labs  Lab 08/23/19 0225 08/26/19 0004  WBC 7.4 8.2   Microbiology No results found for this or any previous visit (from the past 240 hour(s)).   Medications:   . enoxaparin (LOVENOX) injection  40 mg Subcutaneous Q24H  . finasteride  5 mg Oral Daily  . insulin aspart  0-9 Units Subcutaneous TID WC  . insulin glargine  8 Units Subcutaneous Daily  . lisinopril  20 mg Oral Daily  . metoprolol succinate  100 mg Oral Daily  . multivitamin with minerals  1 tablet Oral Daily  . pantoprazole  40 mg Oral Daily  . pravastatin  20 mg Oral QHS  . QUEtiapine  50 mg Oral QHS  . tamsulosin  0.4 mg Oral QHS  . thiamine  100 mg Oral Daily  . vitamin C  500 mg Oral Daily  . zinc sulfate  220 mg Oral Daily   Continuous Infusions:     LOS: 12 days   Charlynne Cousins  [facility]  08/27/2019, 9:56 AM

## 2019-08-28 DIAGNOSIS — Z79899 Other long term (current) drug therapy: Secondary | ICD-10-CM | POA: Diagnosis not present

## 2019-08-28 DIAGNOSIS — R4182 Altered mental status, unspecified: Secondary | ICD-10-CM | POA: Diagnosis not present

## 2019-08-28 DIAGNOSIS — D696 Thrombocytopenia, unspecified: Secondary | ICD-10-CM | POA: Diagnosis not present

## 2019-08-28 DIAGNOSIS — A4189 Other specified sepsis: Secondary | ICD-10-CM | POA: Diagnosis not present

## 2019-08-28 DIAGNOSIS — U071 COVID-19: Secondary | ICD-10-CM | POA: Diagnosis not present

## 2019-08-28 DIAGNOSIS — R0902 Hypoxemia: Secondary | ICD-10-CM | POA: Diagnosis not present

## 2019-08-28 DIAGNOSIS — Z7401 Bed confinement status: Secondary | ICD-10-CM | POA: Diagnosis not present

## 2019-08-28 DIAGNOSIS — I10 Essential (primary) hypertension: Secondary | ICD-10-CM | POA: Diagnosis not present

## 2019-08-28 DIAGNOSIS — J1289 Other viral pneumonia: Secondary | ICD-10-CM | POA: Diagnosis not present

## 2019-08-28 DIAGNOSIS — M2550 Pain in unspecified joint: Secondary | ICD-10-CM | POA: Diagnosis not present

## 2019-08-28 DIAGNOSIS — R2689 Other abnormalities of gait and mobility: Secondary | ICD-10-CM | POA: Diagnosis present

## 2019-08-28 DIAGNOSIS — N182 Chronic kidney disease, stage 2 (mild): Secondary | ICD-10-CM | POA: Diagnosis not present

## 2019-08-28 DIAGNOSIS — E118 Type 2 diabetes mellitus with unspecified complications: Secondary | ICD-10-CM | POA: Diagnosis present

## 2019-08-28 DIAGNOSIS — G9341 Metabolic encephalopathy: Secondary | ICD-10-CM | POA: Diagnosis present

## 2019-08-28 DIAGNOSIS — E1169 Type 2 diabetes mellitus with other specified complication: Secondary | ICD-10-CM | POA: Diagnosis not present

## 2019-08-28 DIAGNOSIS — G9340 Encephalopathy, unspecified: Secondary | ICD-10-CM | POA: Diagnosis not present

## 2019-08-28 DIAGNOSIS — J9601 Acute respiratory failure with hypoxia: Secondary | ICD-10-CM | POA: Diagnosis present

## 2019-08-28 DIAGNOSIS — Z741 Need for assistance with personal care: Secondary | ICD-10-CM | POA: Diagnosis present

## 2019-08-28 DIAGNOSIS — R1312 Dysphagia, oropharyngeal phase: Secondary | ICD-10-CM | POA: Diagnosis present

## 2019-08-28 DIAGNOSIS — E1165 Type 2 diabetes mellitus with hyperglycemia: Secondary | ICD-10-CM

## 2019-08-28 DIAGNOSIS — M6281 Muscle weakness (generalized): Secondary | ICD-10-CM | POA: Diagnosis present

## 2019-08-28 DIAGNOSIS — R488 Other symbolic dysfunctions: Secondary | ICD-10-CM | POA: Diagnosis present

## 2019-08-28 DIAGNOSIS — I1310 Hypertensive heart and chronic kidney disease without heart failure, with stage 1 through stage 4 chronic kidney disease, or unspecified chronic kidney disease: Secondary | ICD-10-CM | POA: Diagnosis not present

## 2019-08-28 DIAGNOSIS — E1122 Type 2 diabetes mellitus with diabetic chronic kidney disease: Secondary | ICD-10-CM | POA: Diagnosis not present

## 2019-08-28 DIAGNOSIS — N400 Enlarged prostate without lower urinary tract symptoms: Secondary | ICD-10-CM | POA: Diagnosis not present

## 2019-08-28 DIAGNOSIS — R5381 Other malaise: Secondary | ICD-10-CM | POA: Diagnosis not present

## 2019-08-28 LAB — GLUCOSE, CAPILLARY
Glucose-Capillary: 160 mg/dL — ABNORMAL HIGH (ref 70–99)
Glucose-Capillary: 161 mg/dL — ABNORMAL HIGH (ref 70–99)
Glucose-Capillary: 173 mg/dL — ABNORMAL HIGH (ref 70–99)
Glucose-Capillary: 189 mg/dL — ABNORMAL HIGH (ref 70–99)

## 2019-08-28 NOTE — Plan of Care (Signed)
Problem: Education: Goal: Knowledge of General Education information will improve Description: Including pain rating scale, medication(s)/side effects and non-pharmacologic comfort measures 08/28/2019 1439 by Camillia Herter, RN Outcome: Adequate for Discharge 08/28/2019 0701 by Camillia Herter, RN Outcome: Progressing   Problem: Health Behavior/Discharge Planning: Goal: Ability to manage health-related needs will improve 08/28/2019 1439 by Camillia Herter, RN Outcome: Adequate for Discharge 08/28/2019 0701 by Camillia Herter, RN Outcome: Progressing   Problem: Clinical Measurements: Goal: Ability to maintain clinical measurements within normal limits will improve 08/28/2019 1439 by Camillia Herter, RN Outcome: Adequate for Discharge 08/28/2019 0701 by Camillia Herter, RN Outcome: Progressing Goal: Will remain free from infection 08/28/2019 1439 by Camillia Herter, RN Outcome: Adequate for Discharge 08/28/2019 0701 by Camillia Herter, RN Outcome: Progressing Goal: Diagnostic test results will improve 08/28/2019 1439 by Camillia Herter, RN Outcome: Adequate for Discharge 08/28/2019 0701 by Camillia Herter, RN Outcome: Progressing Goal: Respiratory complications will improve 08/28/2019 1439 by Camillia Herter, RN Outcome: Adequate for Discharge 08/28/2019 0701 by Camillia Herter, RN Outcome: Progressing Goal: Cardiovascular complication will be avoided 08/28/2019 1439 by Camillia Herter, RN Outcome: Adequate for Discharge 08/28/2019 0701 by Camillia Herter, RN Outcome: Progressing   Problem: Activity: Goal: Risk for activity intolerance will decrease 08/28/2019 1439 by Camillia Herter, RN Outcome: Adequate for Discharge 08/28/2019 0701 by Camillia Herter, RN Outcome: Progressing   Problem: Nutrition: Goal: Adequate nutrition will be maintained 08/28/2019 1439 by Camillia Herter, RN Outcome: Adequate for Discharge 08/28/2019 0701 by Camillia Herter, RN Outcome: Progressing   Problem:  Coping: Goal: Level of anxiety will decrease 08/28/2019 1439 by Camillia Herter, RN Outcome: Adequate for Discharge 08/28/2019 0701 by Camillia Herter, RN Outcome: Progressing   Problem: Elimination: Goal: Will not experience complications related to bowel motility 08/28/2019 1439 by Camillia Herter, RN Outcome: Adequate for Discharge 08/28/2019 0701 by Camillia Herter, RN Outcome: Progressing Goal: Will not experience complications related to urinary retention 08/28/2019 1439 by Camillia Herter, RN Outcome: Adequate for Discharge 08/28/2019 0701 by Camillia Herter, RN Outcome: Progressing   Problem: Pain Managment: Goal: General experience of comfort will improve 08/28/2019 1439 by Camillia Herter, RN Outcome: Adequate for Discharge 08/28/2019 0701 by Camillia Herter, RN Outcome: Progressing   Problem: Safety: Goal: Ability to remain free from injury will improve 08/28/2019 1439 by Camillia Herter, RN Outcome: Adequate for Discharge 08/28/2019 0701 by Camillia Herter, RN Outcome: Progressing   Problem: Skin Integrity: Goal: Risk for impaired skin integrity will decrease 08/28/2019 1439 by Camillia Herter, RN Outcome: Adequate for Discharge 08/28/2019 0701 by Camillia Herter, RN Outcome: Progressing   Problem: Education: Goal: Knowledge of risk factors and measures for prevention of condition will improve 08/28/2019 1439 by Camillia Herter, RN Outcome: Adequate for Discharge 08/28/2019 0701 by Camillia Herter, RN Outcome: Progressing   Problem: Coping: Goal: Psychosocial and spiritual needs will be supported 08/28/2019 1439 by Camillia Herter, RN Outcome: Adequate for Discharge 08/28/2019 0701 by Camillia Herter, RN Outcome: Progressing   Problem: Respiratory: Goal: Will maintain a patent airway 08/28/2019 1439 by Camillia Herter, RN Outcome: Adequate for Discharge 08/28/2019 0701 by Camillia Herter, RN Outcome: Progressing Goal: Complications related to the disease process, condition or  treatment will be avoided or minimized 08/28/2019 1439 by Camillia Herter, RN Outcome: Adequate for Discharge 08/28/2019 0701 by Camillia Herter, RN Outcome:  Progressing

## 2019-08-28 NOTE — Progress Notes (Signed)
Left message for wife that patient has been picked up and on his way to SNF.

## 2019-08-28 NOTE — Discharge Instructions (Signed)
Recommendations for Outpatient Follow-up:  1. Follow up with PCP in 1-2 weeks 2. Please obtain BMP/CBC in one week     Person Under Monitoring Name: Paul Terrell  Location: [place] [id number]   Infection Prevention Recommendations for Individuals Confirmed to have, or Being Evaluated for, 2019 Novel Coronavirus (COVID-19) Infection Who Receive Care at Home  Individuals who are confirmed to have, or are being evaluated for, COVID-19 should follow the prevention steps below until a healthcare provider or local or state health department says they can return to normal activities.  Stay home except to get medical care You should restrict activities outside your home, except for getting medical care. Do not go to work, school, or public areas, and do not use public transportation or taxis.  Call ahead before visiting your doctor Before your medical appointment, call the healthcare provider and tell them that you have, or are being evaluated for, COVID-19 infection. This will help the healthcare providers office take steps to keep other people from getting infected. Ask your healthcare provider to call the local or state health department.  Monitor your symptoms Seek prompt medical attention if your illness is worsening (e.g., difficulty breathing). Before going to your medical appointment, call the healthcare provider and tell them that you have, or are being evaluated for, COVID-19 infection. Ask your healthcare provider to call the local or state health department.  Wear a facemask You should wear a facemask that covers your nose and mouth when you are in the same room with other people and when you visit a healthcare provider. People who live with or visit you should also wear a facemask while they are in the same room with you.  Separate yourself from other people in your home As much as possible, you should stay in a different room from other people in  your home. Also, you should use a separate bathroom, if available.  Avoid sharing household items You should not share dishes, drinking glasses, cups, eating utensils, towels, bedding, or other items with other people in your home. After using these items, you should wash them thoroughly with soap and water.  Cover your coughs and sneezes Cover your mouth and nose with a tissue when you cough or sneeze, or you can cough or sneeze into your sleeve. Throw used tissues in a lined trash can, and immediately wash your hands with soap and water for at least 20 seconds or use an alcohol-based hand rub.  Wash your [facility] your hands often and thoroughly with soap and water for at least 20 seconds. You can use an alcohol-based hand sanitizer if soap and water are not available and if your hands are not visibly dirty. Avoid touching your eyes, nose, and mouth with unwashed hands.   Prevention Steps for Caregivers and Household Members of Individuals Confirmed to have, or Being Evaluated for, COVID-19 Infection Being Cared for in the Home  If you live with, or provide care at home for, a person confirmed to have, or being evaluated for, COVID-19 infection please follow these guidelines to prevent infection:  Follow healthcare providers instructions Make sure that you understand and can help the patient follow any healthcare provider instructions for all care.  Provide for the patients basic needs You should help the patient with basic needs in the home and provide support for getting groceries, prescriptions, and other personal needs.  Monitor the patients symptoms If they are getting sicker, call his or  her medical provider and tell them that the patient has, or is being evaluated for, COVID-19 infection. This will help the healthcare providers office take steps to keep other people from getting infected. Ask the healthcare provider to call the local or state health  department.  Limit the number of people who have contact with the patient  If possible, have only one caregiver for the patient.  Other household members should stay in another home or place of residence. If this is not possible, they should stay  in another room, or be separated from the patient as much as possible. Use a separate bathroom, if available.  Restrict visitors who do not have an essential need to be in the home.  Keep older adults, very young children, and other sick people away from the patient Keep older adults, very young children, and those who have compromised immune systems or chronic health conditions away from the patient. This includes people with chronic heart, lung, or kidney conditions, diabetes, and cancer.  Ensure good ventilation Make sure that shared spaces in the home have good air flow, such as from an air conditioner or an opened window, weather permitting.  Wash your hands often  Wash your hands often and thoroughly with soap and water for at least 20 seconds. You can use an alcohol based hand sanitizer if soap and water are not available and if your hands are not visibly dirty.  Avoid touching your eyes, nose, and mouth with unwashed hands.  Use disposable paper towels to dry your hands. If not available, use dedicated cloth towels and replace them when they become wet.  Wear a facemask and gloves  Wear a disposable facemask at all times in the room and gloves when you touch or have contact with the patients blood, body fluids, and/or secretions or excretions, such as sweat, saliva, sputum, nasal mucus, vomit, urine, or feces.  Ensure the mask fits over your nose and mouth tightly, and do not touch it during use.  Throw out disposable facemasks and gloves after using them. Do not reuse.  Wash your hands immediately after removing your facemask and gloves.  If your personal clothing becomes contaminated, carefully remove clothing and launder. Wash  your hands after handling contaminated clothing.  Place all used disposable facemasks, gloves, and other waste in a lined container before disposing them with other household waste.  Remove gloves and wash your hands immediately after handling these items.  Do not share dishes, glasses, or other household items with the patient  Avoid sharing household items. You should not share dishes, drinking glasses, cups, eating utensils, towels, bedding, or other items with a patient who is confirmed to have, or being evaluated for, COVID-19 infection.  After the person uses these items, you should wash them thoroughly with soap and water.  Wash laundry thoroughly  Immediately remove and wash clothes or bedding that have blood, body fluids, and/or secretions or excretions, such as sweat, saliva, sputum, nasal mucus, vomit, urine, or feces, on them.  Wear gloves when handling laundry from the patient.  Read and follow directions on labels of laundry or clothing items and detergent. In general, wash and dry with the warmest temperatures recommended on the label.  Clean all areas the individual has used often  Clean all touchable surfaces, such as counters, tabletops, doorknobs, bathroom fixtures, toilets, phones, keyboards, tablets, and bedside tables, every day. Also, clean any surfaces that may have blood, body fluids, and/or secretions or excretions on them.  Wear gloves when cleaning surfaces the patient has come in contact with.  Use a diluted bleach solution (e.g., dilute bleach with 1 part bleach and 10 parts water) or a household disinfectant with a label that says EPA-registered for coronaviruses. To make a bleach solution at home, add 1 tablespoon of bleach to 1 quart (4 cups) of water. For a larger supply, add  cup of bleach to 1 gallon (16 cups) of water.  Read labels of cleaning products and follow recommendations provided on product labels. Labels contain instructions for safe and  effective use of the cleaning product including precautions you should take when applying the product, such as wearing gloves or eye protection and making sure you have good ventilation during use of the product.  Remove gloves and wash hands immediately after cleaning.  Monitor yourself for signs and symptoms of illness Caregivers and household members are considered close contacts, should monitor their health, and will be asked to limit movement outside of the home to the extent possible. Follow the monitoring steps for close contacts listed on the symptom monitoring form.   ? If you have additional questions, contact your local health department or call the epidemiologist on call at [phone] (available 24/7). ? This guidance is subject to change. For the most up-to-date guidance from CDC, please refer to their website: YouBlogs.pl  COVID-19 COVID-19 is a respiratory infection that is caused by a virus called severe acute respiratory syndrome coronavirus 2 (SARS-CoV-2). The disease is also known as coronavirus disease or novel coronavirus. In some people, the virus may not cause any symptoms. In others, it may cause a serious infection. The infection can get worse quickly and can lead to complications, such as: Pneumonia, or infection of the lungs. Acute respiratory distress syndrome or ARDS. This is fluid build-up in the lungs. Acute respiratory failure. This is a condition in which there is not enough oxygen passing from the lungs to the body. Sepsis or septic shock. This is a serious bodily reaction to an infection. Blood clotting problems. Secondary infections due to bacteria or fungus. The virus that causes COVID-19 is contagious. This means that it can spread from person to person through droplets from coughs and sneezes (respiratory secretions). What are the causes? This illness is caused by a virus. You may catch  the virus by: Breathing in droplets from an infected person's cough or sneeze. Touching something, like a table or a doorknob, that was exposed to the virus (contaminated) and then touching your mouth, nose, or eyes. What increases the risk? Risk for infection You are more likely to be infected with this virus if you: Live in or travel to an area with a COVID-19 outbreak. Come in contact with a sick person who recently traveled to an area with a COVID-19 outbreak. Provide care for or live with a person who is infected with COVID-19. Risk for serious illness You are more likely to become seriously ill from the virus if you: Are 34 years of age or older. Have a long-term disease that lowers your body's ability to fight infection (immunocompromised). Live in a nursing home or long-term care facility. Have a long-term (chronic) disease such as: Chronic lung disease, including chronic obstructive pulmonary disease or asthma Heart disease. Diabetes. Chronic kidney disease. Liver disease. Are obese. What are the signs or symptoms? Symptoms of this condition can range from mild to severe. Symptoms may appear any time from 2 to 14 days after being exposed to the virus. They include:  A fever. A cough. Difficulty breathing. Chills. Muscle pains. A sore throat. Loss of taste or smell. Some people may also have stomach problems, such as nausea, vomiting, or diarrhea. Other people may not have any symptoms of COVID-19. How is this diagnosed? This condition may be diagnosed based on: Your signs and symptoms, especially if: You live in an area with a COVID-19 outbreak. You recently traveled to or from an area where the virus is common. You provide care for or live with a person who was diagnosed with COVID-19. A physical exam. Lab tests, which may include: A nasal swab to take a sample of fluid from your nose. A throat swab to take a sample of fluid from your throat. A sample of mucus from  your lungs (sputum). Blood tests. Imaging tests, which may include, X-rays, CT scan, or ultrasound. How is this treated? At present, there is no medicine to treat COVID-19. Medicines that treat other diseases are being used on a trial basis to see if they are effective against COVID-19. Your health care provider will talk with you about ways to treat your symptoms. For most people, the infection is mild and can be managed at home with rest, fluids, and over-the-counter medicines. Treatment for a serious infection usually takes places in a hospital intensive care unit (ICU). It may include one or more of the following treatments. These treatments are given until your symptoms improve. Receiving fluids and medicines through an IV. Supplemental oxygen. Extra oxygen is given through a tube in the nose, a face mask, or a hood. Positioning you to lie on your stomach (prone position). This makes it easier for oxygen to get into the lungs. Continuous positive airway pressure (CPAP) or bi-level positive airway pressure (BPAP) machine. This treatment uses mild air pressure to keep the airways open. A tube that is connected to a motor delivers oxygen to the body. Ventilator. This treatment moves air into and out of the lungs by using a tube that is placed in your windpipe. Tracheostomy. This is a procedure to create a hole in the neck so that a breathing tube can be inserted. Extracorporeal membrane oxygenation (ECMO). This procedure gives the lungs a chance to recover by taking over the functions of the heart and lungs. It supplies oxygen to the body and removes carbon dioxide. Follow these instructions at home: Lifestyle If you are sick, stay home except to get medical care. Your health care provider will tell you how long to stay home. Call your health care provider before you go for medical care. Rest at home as told by your health care provider. Do not use any products that contain nicotine or tobacco,  such as cigarettes, e-cigarettes, and chewing tobacco. If you need help quitting, ask your health care provider. Return to your normal activities as told by your health care provider. Ask your health care provider what activities are safe for you. General instructions Take over-the-counter and prescription medicines only as told by your health care provider. Drink enough fluid to keep your urine pale yellow. Keep all follow-up visits as told by your health care provider. This is important. How is this prevented?  There is no vaccine to help prevent COVID-19 infection. However, there are steps you can take to protect yourself and others from this virus. To protect yourself:  Do not travel to areas where COVID-19 is a risk. The areas where COVID-19 is reported change often. To identify high-risk areas and travel restrictions, check the CDC travel  website: FatFares.com.br If you live in, or must travel to, an area where COVID-19 is a risk, take precautions to avoid infection. Stay away from people who are sick. Wash your hands often with soap and water for 20 seconds. If soap and water are not available, use an alcohol-based hand sanitizer. Avoid touching your mouth, face, eyes, or nose. Avoid going out in public, follow guidance from your state and local health authorities. If you must go out in public, wear a cloth face covering or face mask. Disinfect objects and surfaces that are frequently touched every day. This may include: Counters and tables. Doorknobs and light switches. Sinks and faucets. Electronics, such as phones, remote controls, keyboards, computers, and tablets. To protect others: If you have symptoms of COVID-19, take steps to prevent the virus from spreading to others. If you think you have a COVID-19 infection, contact your health care provider right away. Tell your health care team that you think you may have a COVID-19 infection. Stay home. Leave your house  only to seek medical care. Do not use public transport. Do not travel while you are sick. Wash your hands often with soap and water for 20 seconds. If soap and water are not available, use alcohol-based hand sanitizer. Stay away from other members of your household. Let healthy household members care for children and pets, if possible. If you have to care for children or pets, wash your hands often and wear a mask. If possible, stay in your own room, separate from others. Use a different bathroom. Make sure that all people in your household wash their hands well and often. Cough or sneeze into a tissue or your sleeve or elbow. Do not cough or sneeze into your hand or into the air. Wear a cloth face covering or face mask. Where to find more information Centers for Disease Control and Prevention: PurpleGadgets.be World Health Organization: https://www.castaneda.info/ Contact a health care provider if: You live in or have traveled to an area where COVID-19 is a risk and you have symptoms of the infection. You have had contact with someone who has COVID-19 and you have symptoms of the infection. Get help right away if: You have trouble breathing. You have pain or pressure in your chest. You have confusion. You have bluish lips and fingernails. You have difficulty waking from sleep. You have symptoms that get worse. These symptoms may represent a serious problem that is an emergency. Do not wait to see if the symptoms will go away. Get medical help right away. Call your local emergency services (911 in the [place] Do not drive yourself to the hospital. Let the emergency medical personnel know if you think you have COVID-19. Summary COVID-19 is a respiratory infection that is caused by a virus. It is also known as coronavirus disease or novel coronavirus. It can cause serious infections, such as pneumonia, acute respiratory distress syndrome, acute respiratory  failure, or sepsis. The virus that causes COVID-19 is contagious. This means that it can spread from person to person through droplets from coughs and sneezes. You are more likely to develop a serious illness if you are 19 years of age or older, have a weak immunity, live in a nursing home, or have chronic disease. There is no medicine to treat COVID-19. Your health care provider will talk with you about ways to treat your symptoms. Take steps to protect yourself and others from infection. Wash your hands often and disinfect objects and surfaces that are frequently touched every  day. Stay away from people who are sick and wear a mask if you are sick. This information is not intended to replace advice given to you by your health care provider. Make sure you discuss any questions you have with your health care provider. Document Released: 10/15/2018 Document Revised: 02/04/2019 Document Reviewed: 10/15/2018 Elsevier Patient Education  2020 [place]: How to Protect Yourself and Others Know how it spreads There is currently no vaccine to prevent coronavirus disease 2019 (COVID-19). The best way to prevent illness is to avoid being exposed to this virus. The virus is thought to spread mainly from person-to-person. Between people who are in close contact with one another (within about 6 feet). Through respiratory droplets produced when an infected person coughs, sneezes or talks. These droplets can land in the mouths or noses of people who are nearby or possibly be inhaled into the lungs. Some recent studies have suggested that COVID-19 may be spread by people who are not showing symptoms. Everyone should Clean your hands often Wash your hands often with soap and water for at least 20 seconds especially after you have been in a public place, or after blowing your nose, coughing, or sneezing. If soap and water are not readily available, use a hand sanitizer that contains at least 60%  alcohol. Cover all surfaces of your hands and rub them together until they feel dry. Avoid touching your eyes, nose, and mouth with unwashed hands. Avoid close contact Stay home if you are sick. Avoid close contact with people who are sick. Put distance between yourself and other people. Remember that some people without symptoms may be able to spread virus. This is especially important for people who are at higher risk of getting very GainPain.com.cy Cover your mouth and nose with a cloth face cover when around others You could spread COVID-19 to others even if you do not feel sick. Everyone should wear a cloth face cover when they have to go out in public, for example to the grocery store or to pick up other necessities. Cloth face coverings should not be placed on young children under age 49, anyone who has trouble breathing, or is unconscious, incapacitated or otherwise unable to remove the mask without assistance. The cloth face cover is meant to protect other people in case you are infected. Do NOT use a facemask meant for a Dietitian. Continue to keep about 6 feet between yourself and others. The cloth face cover is not a substitute for social distancing. Cover coughs and sneezes If you are in a private setting and do not have on your cloth face covering, remember to always cover your mouth and nose with a tissue when you cough or sneeze or use the inside of your elbow. Throw used tissues in the trash. Immediately wash your hands with soap and water for at least 20 seconds. If soap and water are not readily available, clean your hands with a hand sanitizer that contains at least 60% alcohol. Clean and disinfect Clean AND disinfect frequently touched surfaces daily. This includes tables, doorknobs, light switches, countertops, handles, desks, phones, keyboards, toilets, faucets, and sinks.  RackRewards.fr If surfaces are dirty, clean them: Use detergent or soap and water prior to disinfection. Then, use a household disinfectant. You can see a list of EPA-registered household disinfectants here. michellinders.com 01/26/2019 This information is not intended to replace advice given to you by your health care provider. Make sure you discuss any questions you have with your health  care provider. Document Released: 01/05/2019 Document Revised: 02/03/2019 Document Reviewed: 01/05/2019 Elsevier Patient Education  Gravity A cool mist vaporizer is a device that releases a cool mist into the air. If you have a cough or a cold, using a vaporizer may help relieve your symptoms. The mist adds moisture to the air, which may help thin your mucus and make it less sticky. When your mucus is thin and less sticky, it easier for you to breathe and to cough up secretions. Do not use a vaporizer if you are allergic to mold. Follow these instructions at home: Follow the instructions that come with the vaporizer. Do not use anything other than distilled water in the vaporizer. Do not run the vaporizer all of the time. Doing that can cause mold or bacteria to grow in the vaporizer. Clean the vaporizer after each time that you use it. Clean and dry the vaporizer well before storing it. Stop using the vaporizer if your breathing symptoms get worse. This information is not intended to replace advice given to you by your health care provider. Make sure you discuss any questions you have with your health care provider. Document Released: 06/06/2004 Document Revised: 09/26/2016 Document Reviewed: 12/09/2015 Elsevier Patient Education  2020 [employer].   COVID-19 Frequently Asked Questions COVID-19 (coronavirus disease) is an infection that is caused by a large family of viruses. Some viruses cause illness  in people and others cause illness in animals like camels, cats, and bats. In some cases, the viruses that cause illness in animals can spread to humans. Where did the coronavirus come from? In December 2019, [place] told the [facility]) of several cases of lung disease (human respiratory illness). These cases were linked to an open seafood and livestock market in the city of [place]. The link to the seafood and livestock market suggests that the virus may have spread from animals to humans. However, since that first outbreak in December, the virus has also been shown to spread from person to person. What is the name of the disease and the virus? Disease name Early on, this disease was called novel coronavirus. This is because scientists determined that the disease was caused by a new (novel) respiratory virus. The World Health Organization [facility]) has now named the disease COVID-19, or coronavirus disease. Virus name The virus that causes the disease is called severe acute respiratory syndrome coronavirus 2 (SARS-CoV-2). More information on disease and virus naming World Health Organization [facility]): www.who.int/emergencies/diseases/novel-coronavirus-2019/technical-guidance/naming-the-coronavirus-disease-(covid-2019)-and-the-virus-that-causes-it Who is at risk for complications from coronavirus disease? Some people may be at higher risk for complications from coronavirus disease. This includes older adults and people who have chronic diseases, such as heart disease, diabetes, and lung disease. If you are at higher risk for complications, take these extra precautions: Avoid close contact with people who are sick or have a fever or cough. Stay at least 3-6 ft (1-2 m) away from them, if possible. Wash your hands often with soap and water for at least 20 seconds. Avoid touching your face, mouth, nose, or eyes. Keep supplies on hand at home, such as food, medicine, and cleaning supplies. Stay  home as much as possible. Avoid social gatherings and travel. How does coronavirus disease spread? The virus that causes coronavirus disease spreads easily from person to person (is contagious). There are also cases of community-spread disease. This means the disease has spread to: People who have no known contact with other infected people. People who have  not traveled to areas where there are known cases. It appears to spread from one person to another through droplets from coughing or sneezing. Can I get the virus from touching surfaces or objects? There is still a lot that we do not know about the virus that causes coronavirus disease. Scientists are basing a lot of information on what they know about similar viruses, such as: Viruses cannot generally survive on surfaces for long. They need a human body (host) to survive. It is more likely that the virus is spread by close contact with people who are sick (direct contact), such as through: Shaking hands or hugging. Breathing in respiratory droplets that travel through the air. This can happen when an infected person coughs or sneezes on or near other people. It is less likely that the virus is spread when a person touches a surface or object that has the virus on it (indirect contact). The virus may be able to enter the body if the person touches a surface or object and then touches his or her face, eyes, nose, or mouth. Can a person spread the virus without having symptoms of the disease? It may be possible for the virus to spread before a person has symptoms of the disease, but this is most likely not the main way the virus is spreading. It is more likely for the virus to spread by being in close contact with people who are sick and breathing in the respiratory droplets of a sick person's cough or sneeze. What are the symptoms of coronavirus disease? Symptoms vary from person to person and can range from mild to severe. Symptoms may  include: Fever. Cough. Tiredness, weakness, or fatigue. Fast breathing or feeling short of breath. These symptoms can appear anywhere from 2 to 14 days after you have been exposed to the virus. If you develop symptoms, call your health care provider. People with severe symptoms may need hospital care. If I am exposed to the virus, how long does it take before symptoms start? Symptoms of coronavirus disease may appear anywhere from 2 to 14 days after a person has been exposed to the virus. If you develop symptoms, call your health care provider. Should I be tested for this virus? Your health care provider will decide whether to test you based on your symptoms, history of exposure, and your risk factors. How does a health care provider test for this virus? Health care providers will collect samples to send for testing. Samples may include: Taking a swab of fluid from the nose. Taking fluid from the lungs by having you cough up mucus (sputum) into a sterile cup. Taking a blood sample. Taking a stool or urine sample. Is there a treatment or vaccine for this virus? Currently, there is no vaccine to prevent coronavirus disease. Also, there are no medicines like antibiotics or antivirals to treat the virus. A person who becomes sick is given supportive care, which means rest and fluids. A person may also relieve his or her symptoms by using over-the-counter medicines that treat sneezing, coughing, and runny nose. These are the same medicines that a person takes for the common cold. If you develop symptoms, call your health care provider. People with severe symptoms may need hospital care. What can I do to protect myself and my family from this virus?     You can protect yourself and your family by taking the same actions that you would take to prevent the spread of other viruses. Take the following  actions: Wash your hands often with soap and water for at least 20 seconds. If soap and water are not  available, use alcohol-based hand sanitizer. Avoid touching your face, mouth, nose, or eyes. Cough or sneeze into a tissue, sleeve, or elbow. Do not cough or sneeze into your hand or the air. If you cough or sneeze into a tissue, throw it away immediately and wash your hands. Disinfect objects and surfaces that you frequently touch every day. Avoid close contact with people who are sick or have a fever or cough. Stay at least 3-6 ft (1-2 m) away from them, if possible. Stay home if you are sick, except to get medical care. Call your health care provider before you get medical care. Make sure your vaccines are up to date. Ask your health care provider what vaccines you need. What should I do if I need to travel? Follow travel recommendations from your local health authority, the CDC, and WHO. Travel information and advice Centers for Disease Control and Prevention (CDC): BodyEditor.hu World Health Organization [facility]): ThirdIncome.ca Know the risks and take action to protect your health You are at higher risk of getting coronavirus disease if you are traveling to areas with an outbreak or if you are exposed to travelers from areas with an outbreak. Wash your hands often and practice good hygiene to lower the risk of catching or spreading the virus. What should I do if I am sick? General instructions to stop the spread of infection Wash your hands often with soap and water for at least 20 seconds. If soap and water are not available, use alcohol-based hand sanitizer. Cough or sneeze into a tissue, sleeve, or elbow. Do not cough or sneeze into your hand or the air. If you cough or sneeze into a tissue, throw it away immediately and wash your hands. Stay home unless you must get medical care. Call your health care provider or local health authority before you get medical care. Avoid public areas. Do not  take public transportation, if possible. If you can, wear a mask if you must go out of the house or if you are in close contact with someone who is not sick. Keep your home clean Disinfect objects and surfaces that are frequently touched every day. This may include: Counters and tables. Doorknobs and light switches. Sinks and faucets. Electronics such as phones, remote controls, keyboards, computers, and tablets. Wash dishes in hot, soapy water or use a dishwasher. Air-dry your dishes. Wash laundry in hot water. Prevent infecting other household members Let healthy household members care for children and pets, if possible. If you have to care for children or pets, wash your hands often and wear a mask. Sleep in a different bedroom or bed, if possible. Do not share personal items, such as razors, toothbrushes, deodorant, combs, brushes, towels, and washcloths. Where to find more information Centers for Disease Control and Prevention (CDC) Information and news updates: https://www.butler-gonzalez.com/ World Health Organization [facility]) Information and news updates: MissExecutive.com.ee Coronavirus health topic: https://www.castaneda.info/ Questions and answers on COVID-19: OpportunityDebt.at Global tracker: who.sprinklr.com American Academy of Pediatrics (AAP) Information for families: www.healthychildren.org/English/health-issues/conditions/chest-lungs/Pages/2019-Novel-Coronavirus.aspx The coronavirus situation is changing rapidly. Check your local health authority website or the CDC and [facility] websites for updates and news. When should I contact a health care provider? Contact your health care provider if you have symptoms of an infection, such as fever or cough, and you: Have been near anyone who is known to have coronavirus disease. Have come into contact  with a person who is suspected to have coronavirus  disease. Have traveled outside of the country. When should I get emergency medical care? Get help right away by calling your local emergency services (911 in the [place] if you have: Trouble breathing. Pain or pressure in your chest. Confusion. Blue-tinged lips and fingernails. Difficulty waking from sleep. Symptoms that get worse. Let the emergency medical personnel know if you think you have coronavirus disease. Summary A new respiratory virus is spreading from person to person and causing COVID-19 (coronavirus disease). The virus that causes COVID-19 appears to spread easily. It spreads from one person to another through droplets from coughing or sneezing. Older adults and those with chronic diseases are at higher risk of disease. If you are at higher risk for complications, take extra precautions. There is currently no vaccine to prevent coronavirus disease. There are no medicines, such as antibiotics or antivirals, to treat the virus. You can protect yourself and your family by washing your hands often, avoiding touching your face, and covering your coughs and sneezes. This information is not intended to replace advice given to you by your health care provider. Make sure you discuss any questions you have with your health care provider. Document Released: 01/05/2019 Document Revised: 01/05/2019 Document Reviewed: 01/05/2019 Elsevier Patient Education  [place].   Confusion Confusion is the inability to think with the usual speed or clarity. People who are confused often describe their thinking as cloudy or unclear. Confusion can also include feeling disoriented. This means you are unaware of where you are or who you are. You may also not know the date or time. When confused, you may have difficulty remembering, paying attention, or making decisions. Some people also act aggressively when they are confused. In some cases, confusion may come on quickly. In other cases, it may  develop slowly over time. How quickly confusion comes on depends on the cause. Confusion may be caused by: Head injury (concussion). Seizures. Stroke. Fever. Brain tumor. Decrease in brain function due to a vascular or neurologic condition (dementia). Emotions, like rage or terror. Inability to know what is real and what is not (hallucinations). Infections, such as a urinary tract infection (UTI). Using too much alcohol, drugs, or medicines. Loss of fluid (dehydration) or an imbalance of salts in the body (electrolytes). Lack of sleep. Low blood sugar (diabetes). Low levels of oxygen. This comes from conditions such as chronic lung disorders. Side effects of medicines, or taking medicines that affect other medicines (drug interactions). Lack of certain nutrients, especially niacin, thiamine, vitamin C, or vitamin B. Sudden drop in body temperature (hypothermia). Change in routine, such as traveling or being hospitalized. Follow these instructions at home: Pay attention to your symptoms. Tell your health care provider about any changes or if you develop new symptoms. Follow these instructions to control or treat symptoms. Ask a family member or friend for help if needed. Medicines Take over-the-counter and prescription medicines only as told by your health care provider. Ask your health care provider about changing or stopping any medicines that may be causing your confusion. Avoid pain medicines or sleep medicines until you have fully recovered. Use a pillbox or an alarm to help you take the right medicines at the right time. Lifestyle  Eat a balanced diet that includes fruits and vegetables. Get enough sleep. For most adults, this is 7-9 hours each night. Do not drink alcohol. Do not become isolated. Spend time with other people and make plans for your  days. Do not drive until your health care provider says that it is safe to do so. Do not use any products that contain nicotine or  tobacco, such as cigarettes and e-cigarettes. If you need help quitting, ask your health care provider. Stop other activities that may increase your chances of getting hurt. These may include some work duties, sports activities, swimming, or bike riding. Ask your health care provider what activities are safe for you. What caregivers can do Find out if the person is confused. Ask the person to state his or her name, age, and the date. If the person is unsure or answers incorrectly, he or she may be confused. Always introduce yourself, no matter how well the person knows you. Remind the person of his or her location. Do this often. Place a calendar and clock near the person who is confused. Talk about current events and plans for the day. Keep the environment calm, quiet, and peaceful. Help the person do the things that he or she is unable to do. These include: Taking medicines. Keeping follow-up visits with his or her health care provider. Helping with household duties, including meal preparation. Running errands. Get help if you need it. There are several support groups for caregivers. If the person you are helping needs more support, consider day care, extended care programs, or a skilled nursing facility. The person's health care provider may be able to help evaluate these options. General instructions Monitor yourself for any conditions you may have. These may include: Checking your blood glucose levels, if you have diabetes. Watching your weight, if you are overweight. Monitoring your blood pressure, if you have hypertension. Monitoring your body temperature, if you have a fever. Keep all follow-up visits as told by your health care provider. This is important. Contact a health care provider if: Your symptoms get worse. Get help right away if you: Feel that you are not able to care for yourself. Develop severe headaches, repeated vomiting, seizures, blackouts, or slurred speech. Have  increasing confusion, weakness, numbness, restlessness, or personality changes. Develop a loss of balance, have marked dizziness, feel uncoordinated, or fall. Develop severe anxiety, or you have delusions or hallucinations. These symptoms may represent a serious problem that is an emergency. Do not wait to see if the symptoms will go away. Get medical help right away. Call your local emergency services (911 in the [place] Do not drive yourself to the hospital. Summary Confusion is the inability to think with the usual speed or clarity. People who are confused often describe their thinking as cloudy or unclear. Confusion can also include having difficulty remembering, paying attention, or making decisions. Confusion may come on quickly or develop slowly over time, depending on the cause. There are many different causes of confusion. Ask for help from family members or friends if you are unable to take care of yourself. This information is not intended to replace advice given to you by your health care provider. Make sure you discuss any questions you have with your health care provider. Document Released: 10/17/2004 Document Revised: 09/11/2017 Document Reviewed: 09/11/2017 Elsevier Patient Education  [place].   Cough, Adult A cough helps to clear your throat and lungs. A cough may be a sign of an illness or another medical condition. An acute cough may only last 2-3 weeks, while a chronic cough may last 8 or more weeks. Many things can cause a cough. They include: Germs (viruses or bacteria) that attack the airway. Breathing in things  that bother (irritate) your lungs. Allergies. Asthma. Mucus that runs down the back of your throat (postnasal drip). Smoking. Acid backing up from the stomach into the tube that moves food from the mouth to the stomach (gastroesophageal reflux). Some medicines. Lung problems. Other medical conditions, such as heart failure or a blood clot in the  lung (pulmonary embolism). Follow these instructions at home: Medicines Take over-the-counter and prescription medicines only as told by your doctor. Talk with your doctor before you take medicines that stop a cough (coughsuppressants). Lifestyle  Do not smoke, and try not to be around smoke. Do not use any products that contain nicotine or tobacco, such as cigarettes, e-cigarettes, and chewing tobacco. If you need help quitting, ask your doctor. Drink enough fluid to keep your pee (urine) pale yellow. Avoid caffeine. Do not drink alcohol if your doctor tells you not to drink. General instructions  Watch for any changes in your cough. Tell your doctor about them. Always cover your mouth when you cough. Stay away from things that make you cough, such as perfume, candles, campfire smoke, or cleaning products. If the air is dry, use a cool mist vaporizer or humidifier in your home. If your cough is worse at night, try using extra pillows to raise your head up higher while you sleep. Rest as needed. Keep all follow-up visits as told by your doctor. This is important. Contact a doctor if: You have new symptoms. You cough up pus. Your cough does not get better after 2-3 weeks, or your cough gets worse. Cough medicine does not help your cough and you are not sleeping well. You have pain that gets worse or pain that is not helped with medicine. You have a fever. You are losing weight and you do not know why. You have night sweats. Get help right away if: You cough up blood. You have trouble breathing. Your heartbeat is very fast. These symptoms may be an emergency. Do not wait to see if the symptoms will go away. Get medical help right away. Call your local emergency services (911 in the [place] Do not drive yourself to the hospital. Summary A cough helps to clear your throat and lungs. Many things can cause a cough. Take over-the-counter and prescription medicines only as told by your  doctor. Always cover your mouth when you cough. Contact a doctor if you have new symptoms or you have a cough that does not get better or gets worse. This information is not intended to replace advice given to you by your health care provider. Make sure you discuss any questions you have with your health care provider. Document Released: 05/23/2011 Document Revised: 09/28/2018 Document Reviewed: 09/28/2018 Elsevier Patient Education  [place].

## 2019-08-28 NOTE — TOC Transition Note (Signed)
Transition of Care [facility]) - CM/SW Discharge Note   Patient Details  Name: Paul Terrell MRN: [id number] Date of Birth: Feb 04, 1931  [facility] CM/SW Contact:  Alberteen Sam, LCSW Phone Number: 08/28/2019, 8:48 AM   Clinical Narrative:     Patient will DC to: [place] Anticipated DC date: 08/28/2019 Family notified: [name] and [place] [id number]  Per MD patient ready for DC to [facility] . RN, patient, patient's family, and facility notified of DC. Discharge Summary sent to facility. RN given number for report  [phone] Room 120  . DC packet on chart. Ambulance transport requested for patient.  CSW signing off.  [place], [place]   Final next level of care: Skilled Nursing Facility Barriers to Discharge: No Barriers Identified   Patient Goals and CMS Choice   CMS Medicare.gov Compare Post Acute Care list provided to:: Patient Represenative (must comment)(spouse Everlene Farrier) Choice offered to / list presented to : Spouse  Discharge Placement PASRR number recieved: 08/22/19            Patient chooses bed at: Other - please specify in the comment section below:([facility]) Patient to be transferred to facility by: [place] Name of family member notified: Everlene Farrier and [name] Patient and family notified of of transfer: 08/28/19  Discharge Plan and Services In-house Referral: Clinical Social Work Discharge Planning Services: CM Consult Post Acute Care Choice: [facility]          DME Arranged: N/A DME Agency: NA       HH Arranged: NA HH Agency: NA        Social Determinants of Health (SDOH) Interventions     Readmission Risk Interventions No flowsheet data found.

## 2019-08-28 NOTE — Discharge Summary (Signed)
Physician Discharge Summary  Paul Terrell [id number] DOB: 05-16-1931 DOA: 08/15/2019  PCP: Tonia Ghent, MD  Admit date: 08/15/2019 Discharge date: 08/28/2019  Admitted From: Home Disposition:  SNF  Recommendations for Outpatient Follow-up:  1. Follow up with PCP in 1-2 weeks 2. Please obtain BMP/CBC in one week 3.   Home Health:No Equipment/Devices:None  Discharge Condition:Stable CODE STATUS:DNR Diet recommendation:  Dysphagia 2  Brief/Interim Summary: 85 y.o. male past medical history of diabetes mellitus type 2, essential hypertension, CAD status post PCI, BPH who presents from home on 08/15/2019 with several days of decreased oral intake, dry cough generalized weakness lethargy and difficulty walking, he tested positive for SARS-CoV-2 on 08/14/2019.  He relates his symptoms progressively got worse on the day of admission he fell out of bed with no loss of consciousness on arrival to the ED he was found to be tachycardic tachypneic with a fever of 105 mildly elevated high sensitive troponin to repeat SARS-CoV-2 was positive chest x-ray showed bilateral infiltrates.  CT scan of the head and cervical spine showed no acute findings.  Discharge Diagnoses:  Principal Problem:   Sepsis due to COVID-19 [facility]) Active Problems:   Diabetes mellitus type II   Hyperlipidemia   CAD (coronary artery disease)   BPH (benign prostatic hyperplasia)   Anemia   Hypertension   Pneumonia due to COVID-19 virus   GERD (gastroesophageal reflux disease)   Thrombocytopenia (HCC)   Elevated liver enzymes   Acute metabolic encephalopathy   Acute respiratory failure with hypoxia (HCC)   Confusion   Poor fluid intake  Acute respiratory failure with hypoxia due to pneumonia in the setting of COVID-19/sepsis due to COVID-19: On admission he required high flow nasal cannula to keep saturations greater than 94%. He was started on IV remdesivir for which she completed 5-day course, he was also  started on IV steroids for which he completed a 10-day course. His inflammatory markers improved he was hard to restart eating but 48 hours before discharge his oral intake picked up illness encephalopathy resolved.  Acute metabolic encephalopathy/confusional state: He was not eating or drinking initially, imperative care was consulted to speak with family as he was not awake enough to eat or drink for several days. His IV fluids was stopped in 48 hours before discharge she was eating more than 50% of his food able to talk and carry on a conversation. His episodes of confusion he was treated with IV Haldol and Seroquel as needed and which have now resolved.  Dysphagia: Evaluated by speech recommended dysphagia 2 diet patient is still high risk of aspiration and decompensation.  Goals of care/palliative care encounter: The patient is currently a DNR, fluids were stopped.  Palliative care will continue to work with family as an outpatient at facility, for goals of care.  Transaminitis: Likely due to COVID-19 viral infection.  Essential hypertension: Continue current regimen no changes made.  CAD status post PCI: Continue beta-blockers and statin.  Hyperlipidemia: Continue statin.  BPH: Continue finasteride and Flomax.  Normocytic anemia: Noted no signs of overt bleeding.  Discharge Instructions  Discharge Instructions    Diet - low sodium heart healthy   Complete by: As directed    Increase activity slowly   Complete by: As directed      Allergies as of 08/28/2019      Reactions   Nsaids Other (See Comments)   H/o anemia   Citrus Hives   Zocor [simvastatin] Other (See Comments)   Myalgia  Medication List    TAKE these medications   acetaminophen 500 MG tablet Commonly known as: TYLENOL Take 500 mg by mouth every 6 (six) hours as needed (for pain or headaches).   aspirin 81 MG tablet Take 81 mg by mouth at bedtime.   ferrous sulfate 325 (65 FE) MG  tablet Take 1 tablet (325 mg total) by mouth daily with breakfast.   finasteride 5 MG tablet Commonly known as: PROSCAR TAKE 1 TABLET (5 MG TOTAL) BY MOUTH DAILY. What changed: See the new instructions.   glucose blood test strip Commonly known as: ONE TOUCH ULTRA TEST TEST BLOOD SUGAR ONCE DAILY. DX: 250.00   glyBURIDE 5 MG tablet Commonly known as: DIABETA TAKE 2 TABLETS (10 MG TOTAL) DAILY WITH BREAKFAST. What changed: See the new instructions.   lisinopril 2.5 MG tablet Commonly known as: ZESTRIL TAKE 1 TABLET EVERY DAY   metFORMIN 1000 MG tablet Commonly known as: GLUCOPHAGE TAKE 1 TABLET TWICE DAILY What changed:   how much to take  when to take this   metoprolol succinate 50 MG 24 hr tablet Commonly known as: TOPROL-XL TAKE 1 TABLET EVERY DAY   multivitamin tablet Take 1 tablet by mouth daily.   onetouch ultrasoft lancets Test once daily Dx 250.00   pantoprazole 40 MG tablet Commonly known as: PROTONIX Take 1 tablet (40 mg total) by mouth daily.   pravastatin 20 MG tablet Commonly known as: PRAVACHOL Take 1 tablet (20 mg total) by mouth at bedtime.   tamsulosin 0.4 MG Caps capsule Commonly known as: FLOMAX Take 1 capsule (0.4 mg total) by mouth at bedtime.   vitamin B-12 250 MCG tablet Commonly known as: CYANOCOBALAMIN Take 250 mcg by mouth 2 (two) times daily.   Vitamin D-3 125 MCG (5000 UT) Tabs Take 5,000 Units by mouth daily.       Allergies  Allergen Reactions  . Nsaids Other (See Comments)    H/o anemia  . Citrus Hives  . Zocor [Simvastatin] Other (See Comments)    Myalgia     Consultations:  None   Procedures/Studies: Dg Chest 2 View  Result Date: 08/15/2019 CLINICAL DATA:  Altered mental status. EXAM: CHEST - 2 VIEW COMPARISON:  03/10/2009 FINDINGS: The cardio pericardial silhouette is enlarged. Low lung volumes. Patchy peripheral airspace opacity noted in both lung bases without pleural effusion. The visualized bony  structures of the thorax are intact. Degenerative changes noted right shoulder. IMPRESSION: Patchy peripheral airspace opacity in both lung bases. Features suggest multifocal pneumonia and atypical/viral etiology should be considered. Electronically Signed   By: Misty Stanley M.D.   On: 08/15/2019 11:00   Ct Head Wo Contrast  Result Date: 08/18/2019 CLINICAL DATA:  85 year old male with presumed fall. EXAM: CT HEAD WITHOUT CONTRAST CT CERVICAL SPINE WITHOUT CONTRAST TECHNIQUE: Multidetector CT imaging of the head and cervical spine was performed following the standard protocol without intravenous contrast. Multiplanar CT image reconstructions of the cervical spine were also generated. COMPARISON:  None. FINDINGS: CT HEAD FINDINGS Brain: There is mild age-related atrophy and moderate chronic microvascular ischemic changes. There is no acute intracranial hemorrhage. No mass effect or midline shift. No extra-axial fluid collection. Vascular: No hyperdense vessel or unexpected calcification. Skull: Normal. Negative for fracture or focal lesion. Sinuses/Orbits: Mild mucoperiosteal thickening of left frontal sinus and several ethmoid air cells. No air-fluid level. The remainder of the visualized paranasal sinuses and mastoid air cells are clear. Other: None CT CERVICAL SPINE FINDINGS Alignment: No acute subluxation. Grade 1  C4-C5 anterolisthesis. Skull base and vertebrae: No acute fracture Soft tissues and spinal canal: No prevertebral fluid or swelling. No visible canal hematoma. Disc levels: Multilevel degenerative changes with disc space narrowing and osteophyte. Multilevel facet arthropathy. Upper chest: Negative. Other: Mild carotid bulb calcified plaques. IMPRESSION: 1. No acute intracranial hemorrhage. Age-related atrophy and chronic microvascular ischemic changes. 2. No acute/traumatic cervical spine pathology. Multilevel degenerative changes. Electronically Signed   By: Anner Crete M.D.   On: 08/18/2019  08:19   Ct Head Wo Contrast  Result Date: 08/15/2019 CLINICAL DATA:  Headache. Patient fell out of bed this morning. Pain to the center of the chest. Also with altered mental status. EXAM: CT HEAD WITHOUT CONTRAST CT CERVICAL SPINE WITHOUT CONTRAST TECHNIQUE: Multidetector CT imaging of the head and cervical spine was performed following the standard protocol without intravenous contrast. Multiplanar CT image reconstructions of the cervical spine were also generated. Exam is degraded by patient motion. COMPARISON:  None. FINDINGS: CT HEAD FINDINGS Brain: No evidence of acute infarction, hemorrhage, hydrocephalus, extra-axial collection or mass lesion/mass effect. There is ventricular and sulcal enlargement reflecting mild to moderate atrophy. White matter hypoattenuation is noted bilaterally consistent with moderate chronic microvascular ischemic change. Vascular: No hyperdense vessel or unexpected calcification. Skull: Normal. Negative for fracture or focal lesion. Sinuses/Orbits: Globes and orbits are unremarkable. Sinuses are clear. Other: None. CT CERVICAL SPINE FINDINGS Alignment: Slight anterolisthesis of C4 on C5, degenerative in origin. No other spondylolisthesis. Skull base and vertebrae: No acute fracture. No primary bone lesion or focal pathologic process. Soft tissues and spinal canal: No prevertebral fluid or swelling. No visible canal hematoma. Disc levels: Marked loss of disc height at C5-C6, C6-C7 and C7-T1 with spondylotic disc bulging and endplate spurring. No convincing disc herniation. Facet degenerative changes also noted, greatest on the left at C3-C4 and C4-C5. Upper chest: Ground-glass type opacity left upper lobe, incompletely imaged on this exam. Right lung apex is clear. Other: None. IMPRESSION: HEAD CT 1. No acute intracranial abnormalities. 2. Atrophy and chronic microvascular ischemic change. CERVICAL CT 1. No fracture or acute skeletal abnormality. 2. Ground-glass opacity partly  imaged in the left upper lobe. This could reflect pneumonia, and raises the possibility of COVID-19 infection. Electronically Signed   By: Lajean Manes M.D.   On: 08/15/2019 18:08   Ct Cervical Spine Wo Contrast  Result Date: 08/18/2019 CLINICAL DATA:  85 year old male with presumed fall. EXAM: CT HEAD WITHOUT CONTRAST CT CERVICAL SPINE WITHOUT CONTRAST TECHNIQUE: Multidetector CT imaging of the head and cervical spine was performed following the standard protocol without intravenous contrast. Multiplanar CT image reconstructions of the cervical spine were also generated. COMPARISON:  None. FINDINGS: CT HEAD FINDINGS Brain: There is mild age-related atrophy and moderate chronic microvascular ischemic changes. There is no acute intracranial hemorrhage. No mass effect or midline shift. No extra-axial fluid collection. Vascular: No hyperdense vessel or unexpected calcification. Skull: Normal. Negative for fracture or focal lesion. Sinuses/Orbits: Mild mucoperiosteal thickening of left frontal sinus and several ethmoid air cells. No air-fluid level. The remainder of the visualized paranasal sinuses and mastoid air cells are clear. Other: None CT CERVICAL SPINE FINDINGS Alignment: No acute subluxation. Grade 1 C4-C5 anterolisthesis. Skull base and vertebrae: No acute fracture Soft tissues and spinal canal: No prevertebral fluid or swelling. No visible canal hematoma. Disc levels: Multilevel degenerative changes with disc space narrowing and osteophyte. Multilevel facet arthropathy. Upper chest: Negative. Other: Mild carotid bulb calcified plaques. IMPRESSION: 1. No acute intracranial hemorrhage. Age-related atrophy and  chronic microvascular ischemic changes. 2. No acute/traumatic cervical spine pathology. Multilevel degenerative changes. Electronically Signed   By: Anner Crete M.D.   On: 08/18/2019 08:19   Ct Cervical Spine Wo Contrast  Result Date: 08/15/2019 CLINICAL DATA:  Headache. Patient fell out of  bed this morning. Pain to the center of the chest. Also with altered mental status. EXAM: CT HEAD WITHOUT CONTRAST CT CERVICAL SPINE WITHOUT CONTRAST TECHNIQUE: Multidetector CT imaging of the head and cervical spine was performed following the standard protocol without intravenous contrast. Multiplanar CT image reconstructions of the cervical spine were also generated. Exam is degraded by patient motion. COMPARISON:  None. FINDINGS: CT HEAD FINDINGS Brain: No evidence of acute infarction, hemorrhage, hydrocephalus, extra-axial collection or mass lesion/mass effect. There is ventricular and sulcal enlargement reflecting mild to moderate atrophy. White matter hypoattenuation is noted bilaterally consistent with moderate chronic microvascular ischemic change. Vascular: No hyperdense vessel or unexpected calcification. Skull: Normal. Negative for fracture or focal lesion. Sinuses/Orbits: Globes and orbits are unremarkable. Sinuses are clear. Other: None. CT CERVICAL SPINE FINDINGS Alignment: Slight anterolisthesis of C4 on C5, degenerative in origin. No other spondylolisthesis. Skull base and vertebrae: No acute fracture. No primary bone lesion or focal pathologic process. Soft tissues and spinal canal: No prevertebral fluid or swelling. No visible canal hematoma. Disc levels: Marked loss of disc height at C5-C6, C6-C7 and C7-T1 with spondylotic disc bulging and endplate spurring. No convincing disc herniation. Facet degenerative changes also noted, greatest on the left at C3-C4 and C4-C5. Upper chest: Ground-glass type opacity left upper lobe, incompletely imaged on this exam. Right lung apex is clear. Other: None. IMPRESSION: HEAD CT 1. No acute intracranial abnormalities. 2. Atrophy and chronic microvascular ischemic change. CERVICAL CT 1. No fracture or acute skeletal abnormality. 2. Ground-glass opacity partly imaged in the left upper lobe. This could reflect pneumonia, and raises the possibility of COVID-19  infection. Electronically Signed   By: Lajean Manes M.D.   On: 08/15/2019 18:08   Mr Brain Wo Contrast  Result Date: 08/16/2019 CLINICAL DATA:  Encephalopathy. COVID-19 infection. EXAM: MRI HEAD WITHOUT CONTRAST TECHNIQUE: Multiplanar, multiecho pulse sequences of the brain and surrounding structures were obtained without intravenous contrast. COMPARISON:  None. FINDINGS: Brain: Diffusion-weighted images demonstrate no acute or subacute infarction. Hemorrhage or mass lesion present. Moderate generalized atrophy and confluent white matter disease is present bilaterally. Subcortical T2 hyperintensities are noted as well. The ventricles are proportionate to the degree of atrophy. No significant extraaxial fluid collection is present. White matter changes extend into the brainstem. Cerebellum is unremarkable. Vascular: Flow is present in the major intracranial arteries. Skull and upper cervical spine: The craniocervical junction is normal. Upper cervical spine is within normal limits. Marrow signal is unremarkable. Sinuses/Orbits: The paranasal sinuses and mastoid air cells are clear. Bilateral lens replacements are noted. Globes and orbits are otherwise unremarkable. IMPRESSION: 1. No acute or focal abnormality to explain the patient's symptoms. 2. Moderate generalized atrophy and confluent white matter disease. This likely reflects the sequela of chronic microvascular ischemia. Electronically Signed   By: San Morelle M.D.   On: 08/16/2019 14:43   Dg Chest Port 1 View  Result Date: 08/16/2019 CLINICAL DATA:  Sepsis. COVID-19 positive EXAM: PORTABLE CHEST 1 VIEW COMPARISON:  08/15/2019 FINDINGS: Cardiomediastinal silhouette is mildly enlarged, unchanged. Persistent patchy bibasilar airspace opacities, left greater than right, not significantly changed from prior. No pleural effusion. No pneumothorax. IMPRESSION: No significant change in patchy bibasilar airspace opacities, left greater than right.  Electronically Signed   By: Davina Poke M.D.   On: 08/16/2019 11:09     Subjective: He relates he is hungry, has no shortness of breath no complaints today. Discharge Exam: Vitals:   08/28/19 0436 08/28/19 0733  BP:  (!) 153/70  Pulse:  (!) 55  Resp:  16  Temp: 98.1 F (36.7 C) 97.6 F (36.4 C)  SpO2:  98%   Vitals:   08/28/19 0050 08/28/19 0435 08/28/19 0436 08/28/19 0733  BP: (!) 124/54 (!) 155/66  (!) 153/70  Pulse:    (!) 55  Resp:    16  Temp: (!) 97.4 F (36.3 C)  98.1 F (36.7 C) 97.6 F (36.4 C)  TempSrc: Oral  Oral Oral  SpO2:    98%  Weight:      Height:        General: Pt is alert, awake, not in acute distress Cardiovascular: RRR, S1/S2 +, no rubs, no gallops Respiratory: CTA bilaterally, no wheezing, no rhonchi Abdominal: Soft, NT, ND, bowel sounds + Extremities: no edema, no cyanosis    The results of significant diagnostics from this hospitalization (including imaging, microbiology, ancillary and laboratory) are listed below for reference.     Microbiology: No results found for this or any previous visit (from the past 240 hour(s)).   Labs: BNP (last 3 results) Recent Labs    08/15/19 2044  BNP [id number]   Basic Metabolic Panel: Recent Labs  Lab 08/22/19 0036 08/23/19 0225 08/24/19 0307 08/25/19 0005  NA 139 137 138 137  K 3.4* 3.9 3.8 4.3  CL 105 100 102 100  CO2 25 27 26 24   GLUCOSE 162* 234* 161* 277*  BUN 16 17 14 18   CREATININE 0.77 0.75 0.66 0.70  CALCIUM 8.6* 8.5* 8.6* 8.7*   Liver Function Tests: Recent Labs  Lab 08/22/19 0036 08/23/19 0225 08/24/19 0307 08/25/19 0005  AST 58* 40 34 29  ALT 71* 60* 49* 45*  ALKPHOS 68 64 62 66  BILITOT 1.1 1.4* 1.1 0.9  PROT 5.9* 6.1* 5.4* 5.7*  ALBUMIN 2.7* 2.8* 2.4* 2.6*   No results for input(s): LIPASE, AMYLASE in the last 168 hours. No results for input(s): AMMONIA in the last 168 hours. CBC: Recent Labs  Lab 08/23/19 0225 08/26/19 0004  WBC 7.4 8.2  HGB 13.9 12.4*   HCT 42.8 38.4*  MCV 90.9 91.6  PLT 311 209   Cardiac Enzymes: No results for input(s): CKTOTAL, CKMB, CKMBINDEX, TROPONINI in the last 168 hours. BNP: Invalid input(s): POCBNP CBG: Recent Labs  Lab 08/27/19 1122 08/27/19 1607 08/27/19 2052 08/28/19 0054 08/28/19 0730  GLUCAP 239* 133* 150* 160* 161*   D-Dimer No results for input(s): DDIMER in the last 72 hours. Hgb A1c No results for input(s): HGBA1C in the last 72 hours. Lipid Profile No results for input(s): CHOL, HDL, LDLCALC, TRIG, CHOLHDL, LDLDIRECT in the last 72 hours. Thyroid function studies No results for input(s): TSH, T4TOTAL, T3FREE, THYROIDAB in the last 72 hours.  Invalid input(s): FREET3 Anemia work up No results for input(s): VITAMINB12, FOLATE, FERRITIN, TIBC, IRON, RETICCTPCT in the last 72 hours. Urinalysis    Component Value Date/Time   COLORURINE AMBER (A) 08/15/2019 1609   APPEARANCEUR CLOUDY (A) 08/15/2019 1609   LABSPEC 1.024 08/15/2019 1609   PHURINE 5.0 08/15/2019 1609   GLUCOSEU 50 (A) 08/15/2019 1609   HGBUR SMALL (A) 08/15/2019 1609   BILIRUBINUR NEGATIVE 08/15/2019 1609   KETONESUR 5 (A) 08/15/2019 1609   PROTEINUR 100 (A)  08/15/2019 1609   NITRITE NEGATIVE 08/15/2019 1609   LEUKOCYTESUR NEGATIVE 08/15/2019 1609   Sepsis Labs Invalid input(s): PROCALCITONIN,  WBC,  LACTICIDVEN Microbiology No results found for this or any previous visit (from the past 240 hour(s)).   Time coordinating discharge: Over 40 minutes  SIGNED:   Charlynne Cousins, MD  [facility] 08/28/2019, 8:08 AM Pager   If 7PM-7AM, please contact night-coverage [email or web address] Password [email or web address]

## 2019-08-28 NOTE — Progress Notes (Signed)
Report called to Lorne Skeens, RN at [facility]

## 2019-08-28 NOTE — Plan of Care (Signed)

## 2019-08-28 NOTE — TOC Progression Note (Signed)
Transition of Care [facility]) - Progression Note    Patient Details  Name: RHEN LORDAN MRN: [id number] Date of Birth: 11/24/1930  Transition of Care [facility]) CM/SW [place], [place] Phone Number: [phone] 08/28/2019, 8:35 AM  Clinical Narrative:     CSW attempted to call patient son [name], lvm. CSW called patient's spouse Everlene Farrier, she is agreeable to bed offer of [place]. CSW informed her of discharge today and will update her when ambulance is called for transport.   [name] called back and reports in agreement with dc plan, all questions and concerns addressed at this time.   Expected Discharge Plan: [place] Barriers to Discharge: Continued Medical Work up, SNF Pending bed offer  Expected Discharge Plan and Services Expected Discharge Plan: [place] In-house Referral: Clinical Social Work Discharge Planning Services: CM Consult Post Acute Care Choice: [place] arrangements for the past 2 months: Single Family Home Expected Discharge Date: 08/28/19               DME Arranged: N/A DME Agency: NA       HH Arranged: NA HH Agency: NA         Social Determinants of Health (SDOH) Interventions    Readmission Risk Interventions No flowsheet data found.

## 2019-09-02 DIAGNOSIS — I10 Essential (primary) hypertension: Secondary | ICD-10-CM | POA: Diagnosis not present

## 2019-09-02 DIAGNOSIS — R5381 Other malaise: Secondary | ICD-10-CM | POA: Diagnosis not present

## 2019-09-02 DIAGNOSIS — G9340 Encephalopathy, unspecified: Secondary | ICD-10-CM | POA: Diagnosis not present

## 2019-09-02 DIAGNOSIS — U071 COVID-19: Secondary | ICD-10-CM | POA: Diagnosis not present

## 2019-09-22 DIAGNOSIS — N182 Chronic kidney disease, stage 2 (mild): Secondary | ICD-10-CM | POA: Diagnosis not present

## 2019-09-22 DIAGNOSIS — E1122 Type 2 diabetes mellitus with diabetic chronic kidney disease: Secondary | ICD-10-CM | POA: Diagnosis not present

## 2019-09-22 DIAGNOSIS — Z79899 Other long term (current) drug therapy: Secondary | ICD-10-CM | POA: Diagnosis not present

## 2019-09-27 DIAGNOSIS — E1169 Type 2 diabetes mellitus with other specified complication: Secondary | ICD-10-CM | POA: Diagnosis not present

## 2019-09-27 DIAGNOSIS — N182 Chronic kidney disease, stage 2 (mild): Secondary | ICD-10-CM | POA: Diagnosis not present

## 2019-09-27 DIAGNOSIS — I1310 Hypertensive heart and chronic kidney disease without heart failure, with stage 1 through stage 4 chronic kidney disease, or unspecified chronic kidney disease: Secondary | ICD-10-CM | POA: Diagnosis not present

## 2019-09-27 DIAGNOSIS — G9340 Encephalopathy, unspecified: Secondary | ICD-10-CM | POA: Diagnosis not present

## 2019-09-29 ENCOUNTER — Telehealth: Payer: Self-pay

## 2019-09-29 DIAGNOSIS — E785 Hyperlipidemia, unspecified: Secondary | ICD-10-CM | POA: Diagnosis not present

## 2019-09-29 DIAGNOSIS — G9341 Metabolic encephalopathy: Secondary | ICD-10-CM | POA: Diagnosis not present

## 2019-09-29 DIAGNOSIS — J9601 Acute respiratory failure with hypoxia: Secondary | ICD-10-CM | POA: Diagnosis not present

## 2019-09-29 DIAGNOSIS — I10 Essential (primary) hypertension: Secondary | ICD-10-CM | POA: Diagnosis not present

## 2019-09-29 DIAGNOSIS — D696 Thrombocytopenia, unspecified: Secondary | ICD-10-CM | POA: Diagnosis not present

## 2019-09-29 DIAGNOSIS — H9190 Unspecified hearing loss, unspecified ear: Secondary | ICD-10-CM | POA: Diagnosis not present

## 2019-09-29 DIAGNOSIS — K219 Gastro-esophageal reflux disease without esophagitis: Secondary | ICD-10-CM | POA: Diagnosis not present

## 2019-09-29 DIAGNOSIS — U071 COVID-19: Secondary | ICD-10-CM | POA: Diagnosis not present

## 2019-09-29 DIAGNOSIS — N400 Enlarged prostate without lower urinary tract symptoms: Secondary | ICD-10-CM | POA: Diagnosis not present

## 2019-09-29 DIAGNOSIS — Z87891 Personal history of nicotine dependence: Secondary | ICD-10-CM | POA: Diagnosis not present

## 2019-09-29 DIAGNOSIS — Z95818 Presence of other cardiac implants and grafts: Secondary | ICD-10-CM | POA: Diagnosis not present

## 2019-09-29 DIAGNOSIS — Z85828 Personal history of other malignant neoplasm of skin: Secondary | ICD-10-CM | POA: Diagnosis not present

## 2019-09-29 DIAGNOSIS — Z7984 Long term (current) use of oral hypoglycemic drugs: Secondary | ICD-10-CM | POA: Diagnosis not present

## 2019-09-29 DIAGNOSIS — Z7982 Long term (current) use of aspirin: Secondary | ICD-10-CM | POA: Diagnosis not present

## 2019-09-29 DIAGNOSIS — J1282 Pneumonia due to coronavirus disease 2019: Secondary | ICD-10-CM | POA: Diagnosis not present

## 2019-09-29 DIAGNOSIS — E119 Type 2 diabetes mellitus without complications: Secondary | ICD-10-CM | POA: Diagnosis not present

## 2019-09-29 DIAGNOSIS — R1310 Dysphagia, unspecified: Secondary | ICD-10-CM | POA: Diagnosis not present

## 2019-09-29 DIAGNOSIS — I2510 Atherosclerotic heart disease of native coronary artery without angina pectoris: Secondary | ICD-10-CM | POA: Diagnosis not present

## 2019-09-29 DIAGNOSIS — D649 Anemia, unspecified: Secondary | ICD-10-CM | POA: Diagnosis not present

## 2019-09-29 NOTE — Telephone Encounter (Signed)
Is the patient going to be able to follow-up here (or can we do video follow-up)?  I am always glad to see this patient and I will be happy to follow along/approve these orders assuming I will still be able to have follow-up with the patient.  Thanks.

## 2019-09-29 NOTE — Telephone Encounter (Signed)
Amy nurse with [facility] left v/m requesting verbal orders for [employer] nursing 1 x a wk for however long insurance will allow for med mgt, hypertension and diabetes mgt. Amy also requesting verbal orders for evaluation by OT,PT, speech therapist and bath aide. Amy also wants to verify that Dr [name] will sign [place] orders.

## 2019-09-30 DIAGNOSIS — J1282 Pneumonia due to coronavirus disease 2019: Secondary | ICD-10-CM | POA: Diagnosis not present

## 2019-09-30 DIAGNOSIS — U071 COVID-19: Secondary | ICD-10-CM | POA: Diagnosis not present

## 2019-09-30 DIAGNOSIS — I2510 Atherosclerotic heart disease of native coronary artery without angina pectoris: Secondary | ICD-10-CM | POA: Diagnosis not present

## 2019-09-30 DIAGNOSIS — G9341 Metabolic encephalopathy: Secondary | ICD-10-CM | POA: Diagnosis not present

## 2019-09-30 DIAGNOSIS — E119 Type 2 diabetes mellitus without complications: Secondary | ICD-10-CM | POA: Diagnosis not present

## 2019-09-30 DIAGNOSIS — J9601 Acute respiratory failure with hypoxia: Secondary | ICD-10-CM | POA: Diagnosis not present

## 2019-09-30 NOTE — Telephone Encounter (Signed)
Noted. Thanks.

## 2019-09-30 NOTE — Telephone Encounter (Signed)
Amy advised and patient has an appointment scheduled next Thursday.

## 2019-10-01 ENCOUNTER — Telehealth: Payer: Self-pay

## 2019-10-01 NOTE — Telephone Encounter (Signed)
Leroy Kennedy, Home Health physical therapist with [facility], left message asking for verbal orders for home health P.T. for the patient: 1 week x 1, 2 weeks x 6, and 1 week x 2. CB is [phone], can leave detailed message on her voicemail if she does not answer with feedback.

## 2019-10-01 NOTE — Telephone Encounter (Signed)
Please give the order.  Thanks.   

## 2019-10-01 NOTE — Telephone Encounter (Signed)
Left verbal order on voicemail as instructed. Okay per Dr. [name].

## 2019-10-05 ENCOUNTER — Other Ambulatory Visit: Payer: Self-pay

## 2019-10-05 DIAGNOSIS — J1282 Pneumonia due to coronavirus disease 2019: Secondary | ICD-10-CM | POA: Diagnosis not present

## 2019-10-05 DIAGNOSIS — E119 Type 2 diabetes mellitus without complications: Secondary | ICD-10-CM | POA: Diagnosis not present

## 2019-10-05 DIAGNOSIS — U071 COVID-19: Secondary | ICD-10-CM | POA: Diagnosis not present

## 2019-10-05 DIAGNOSIS — J9601 Acute respiratory failure with hypoxia: Secondary | ICD-10-CM | POA: Diagnosis not present

## 2019-10-05 DIAGNOSIS — I2510 Atherosclerotic heart disease of native coronary artery without angina pectoris: Secondary | ICD-10-CM | POA: Diagnosis not present

## 2019-10-05 DIAGNOSIS — G9341 Metabolic encephalopathy: Secondary | ICD-10-CM | POA: Diagnosis not present

## 2019-10-05 MED ORDER — ONETOUCH ULTRA BLUE VI STRP: ORAL_STRIP | 1 refills | Status: DC

## 2019-10-05 NOTE — Telephone Encounter (Signed)
Agree with all of that (cutting back on sweets and salt).  He has f/u pending.  If he is feeling worse in the meantime, then please let me know.  Thanks.

## 2019-10-05 NOTE — Telephone Encounter (Signed)
Amy nurse with [facility] left v/m; that this morning pt BS was 276 after eating breakfast (pt s wife told Amy pt had been eating a lot of sweets). Pt seemed fine and no symptoms. pts test strips have expired and request refill of test strips to [facility]. Also Amy noted that both of pts lower legs are swollen 1+ edema to almost 2+ edema, feet are puffy and could not see ankle bones. Amy said pt had eaten a can of soup and extra salt on 10/04/19. Amy reviewed education of foods with salt and using extra salt with pt.

## 2019-10-05 NOTE — Telephone Encounter (Signed)
Called Pt No answer, left a VM to call office.

## 2019-10-06 ENCOUNTER — Other Ambulatory Visit: Payer: Self-pay | Admitting: Family Medicine

## 2019-10-06 NOTE — Telephone Encounter (Signed)
[place] Night - Client Nonclinical Telephone Record AccessNurse Client [facility] Night - Client Client Site [facility] Physician Renford Dills - MD Contact Type Call Who Is Calling Patient / Member / Family / Caregiver Caller Name [name] Phone Number [phone] Call Type Message Only Information Provided Reason for Call Returning a Call from the Office Initial [name] states that his father has an appointment on Thursday and the office asked the caller to return a call to them prior to the appointment. Additional Comment Caller states that his father Paul Terrell has an appointment Thursday and he was returning a call to the office. Please call back. Disp. Time Disposition Final User 10/05/2019 5:05:57 PM General Information Provided Yes Wynema Birch Call Closed By: Wynema Birch Transaction Date/Time: 10/05/2019 5:02:40 PM (ET

## 2019-10-06 NOTE — Telephone Encounter (Signed)
[name], patient's son advised of everything and will keep appointment on 10/07/2019

## 2019-10-07 ENCOUNTER — Ambulatory Visit (INDEPENDENT_AMBULATORY_CARE_PROVIDER_SITE_OTHER)
Admission: RE | Admit: 2019-10-07 | Discharge: 2019-10-07 | Disposition: A | Discharge status: Home / Self Care | Payer: Medicare Other | Source: Ambulatory Visit | Attending: Family Medicine | Admitting: Family Medicine

## 2019-10-07 ENCOUNTER — Telehealth: Payer: Self-pay | Admitting: Family Medicine

## 2019-10-07 ENCOUNTER — Ambulatory Visit (INDEPENDENT_AMBULATORY_CARE_PROVIDER_SITE_OTHER): Payer: Medicare Other | Admitting: Family Medicine

## 2019-10-07 ENCOUNTER — Encounter: Payer: Self-pay | Admitting: Family Medicine

## 2019-10-07 ENCOUNTER — Telehealth: Payer: Self-pay

## 2019-10-07 ENCOUNTER — Other Ambulatory Visit: Payer: Self-pay

## 2019-10-07 VITALS — BP 120/50 | HR 66 | Temp 96.9°F | Ht 64.0 in | Wt 154.9 lb

## 2019-10-07 DIAGNOSIS — E119 Type 2 diabetes mellitus without complications: Secondary | ICD-10-CM | POA: Diagnosis not present

## 2019-10-07 DIAGNOSIS — R9389 Abnormal findings on diagnostic imaging of other specified body structures: Secondary | ICD-10-CM

## 2019-10-07 DIAGNOSIS — J9601 Acute respiratory failure with hypoxia: Secondary | ICD-10-CM

## 2019-10-07 DIAGNOSIS — I10 Essential (primary) hypertension: Secondary | ICD-10-CM

## 2019-10-07 DIAGNOSIS — D649 Anemia, unspecified: Secondary | ICD-10-CM | POA: Diagnosis not present

## 2019-10-07 DIAGNOSIS — G9341 Metabolic encephalopathy: Secondary | ICD-10-CM

## 2019-10-07 DIAGNOSIS — R918 Other nonspecific abnormal finding of lung field: Secondary | ICD-10-CM | POA: Diagnosis not present

## 2019-10-07 LAB — CBC WITH DIFFERENTIAL/PLATELET
Basophils Absolute: 0.1 10*3/uL (ref 0.0–0.1)
Basophils Relative: 0.9 % (ref 0.0–3.0)
Eosinophils Absolute: 0.2 10*3/uL (ref 0.0–0.7)
Eosinophils Relative: 2.9 % (ref 0.0–5.0)
HCT: 35 % — ABNORMAL LOW (ref 39.0–52.0)
Hemoglobin: 11.8 g/dL — ABNORMAL LOW (ref 13.0–17.0)
Lymphocytes Relative: 29.3 % (ref 12.0–46.0)
Lymphs Abs: 1.7 10*3/uL (ref 0.7–4.0)
MCHC: 33.6 g/dL (ref 30.0–36.0)
MCV: 94.8 fl (ref 78.0–100.0)
Monocytes Absolute: 0.6 10*3/uL (ref 0.1–1.0)
Monocytes Relative: 11.3 % (ref 3.0–12.0)
Neutro Abs: 3.2 10*3/uL (ref 1.4–7.7)
Neutrophils Relative %: 55.6 % (ref 43.0–77.0)
Platelets: 169 10*3/uL (ref 150.0–400.0)
RBC: 3.69 Mil/uL — ABNORMAL LOW (ref 4.22–5.81)
RDW: 17.7 % — ABNORMAL HIGH (ref 11.5–15.5)
WBC: 5.7 10*3/uL (ref 4.0–10.5)

## 2019-10-07 LAB — COMPREHENSIVE METABOLIC PANEL
ALT: 19 U/L (ref 0–53)
AST: 21 U/L (ref 0–37)
Albumin: 3.5 g/dL (ref 3.5–5.2)
Alkaline Phosphatase: 65 U/L (ref 39–117)
BUN: 12 mg/dL (ref 6–23)
CO2: 31 mEq/L (ref 19–32)
Calcium: 8.8 mg/dL (ref 8.4–10.5)
Chloride: 100 mEq/L (ref 96–112)
Creatinine, Ser: 0.82 mg/dL (ref 0.40–1.50)
GFR: 88.98 mL/min (ref >60.00)
Glucose, Bld: 245 mg/dL — ABNORMAL HIGH (ref 70–99)
Potassium: 4.7 mEq/L (ref 3.5–5.1)
Sodium: 137 mEq/L (ref 135–145)
Total Bilirubin: 0.4 mg/dL (ref 0.2–1.2)
Total Protein: 5.8 g/dL — ABNORMAL LOW (ref 6.0–8.3)

## 2019-10-07 LAB — IRON: Iron: 54 ug/dL (ref 42–165)

## 2019-10-07 LAB — HEMOGLOBIN A1C: Hgb A1c MFr Bld: 7.1 % — ABNORMAL HIGH (ref 4.6–6.5)

## 2019-10-07 MED ORDER — FINASTERIDE 5 MG PO TABS: ORAL_TABLET | ORAL | Status: DC

## 2019-10-07 MED ORDER — METOPROLOL SUCCINATE ER 50 MG PO TB24: 50.0000 mg | ORAL_TABLET | Freq: Every day | ORAL | Status: DC

## 2019-10-07 MED ORDER — BLOOD GLUCOSE METER KIT: PACK | 6 refills | Status: AC

## 2019-10-07 MED ORDER — GLYBURIDE 5 MG PO TABS: ORAL_TABLET | ORAL | Status: DC

## 2019-10-07 MED ORDER — METFORMIN HCL 1000 MG PO TABS: 1000.0000 mg | ORAL_TABLET | Freq: Two times a day (BID) | ORAL | Status: DC

## 2019-10-07 NOTE — Patient Instructions (Addendum)
Let me know which meter is covered and I can send that in.    Go to the lab on the way out.  We'll contact you with your lab and xray report.  Stop lisinopril for now and see if the lightheadedness gets better.  If you have to, then stop the metoprolol also.  Update me as needed.  Take care.  Glad to see you.

## 2019-10-07 NOTE — Telephone Encounter (Signed)
Patient's wife called She wanted to let you know that the Pravastin is the correct medication   Patient was advised to call back once he got home and knew the correct med

## 2019-10-07 NOTE — Telephone Encounter (Signed)
Received fax from [employer] stating that insurance will cover Accu check instead of One touch. RX for all supplies sent in.

## 2019-10-07 NOTE — Progress Notes (Signed)
This visit occurred during the SARS-CoV-2 public health emergency.  Safety protocols were in place, including screening questions prior to the visit, additional usage of staff PPE, and extensive cleaning of exam room while observing appropriate contact time as indicated for disinfecting solutions.  Here today with his son.  COVID f/u.  Inpatient course discussed with patient.  Out of SNF now.  Not SOB.  No CP.  He clearly improved from his previous baseline.  He previously had altered mental status at the worst of his illness.  He is lightheaded on standing.  Lower BP noted.  No falls.  At home with family help.  Has cane/walker for home use.  Has [place] set.  He has some BLE edema, with some inc in salt in the meantime.    Sugar has been elevated in the meantime.  220 this AM.  Had been elevated recently.  He got a new tester in the meantime.  Usually 175-220, occ higher.    PMH and SH reviewed  ROS: Per HPI unless specifically indicated in ROS section   Meds, vitals, and allergies reviewed.   GEN: nad, alert and oriented HEENT: ncat NECK: supple w/o LA CV: rrr. PULM: ctab, no inc wob ABD: soft, +bs EXT: Trace BLE edema SKIN: no acute rash

## 2019-10-08 NOTE — Telephone Encounter (Signed)
Thanks    Med list updated

## 2019-10-09 DIAGNOSIS — J1282 Pneumonia due to coronavirus disease 2019: Secondary | ICD-10-CM | POA: Diagnosis not present

## 2019-10-09 DIAGNOSIS — G9341 Metabolic encephalopathy: Secondary | ICD-10-CM | POA: Diagnosis not present

## 2019-10-09 DIAGNOSIS — I2510 Atherosclerotic heart disease of native coronary artery without angina pectoris: Secondary | ICD-10-CM | POA: Diagnosis not present

## 2019-10-09 DIAGNOSIS — E119 Type 2 diabetes mellitus without complications: Secondary | ICD-10-CM | POA: Diagnosis not present

## 2019-10-09 DIAGNOSIS — J9601 Acute respiratory failure with hypoxia: Secondary | ICD-10-CM | POA: Diagnosis not present

## 2019-10-09 DIAGNOSIS — U071 COVID-19: Secondary | ICD-10-CM | POA: Diagnosis not present

## 2019-10-10 ENCOUNTER — Other Ambulatory Visit: Payer: Self-pay | Admitting: Family Medicine

## 2019-10-10 DIAGNOSIS — R9389 Abnormal findings on diagnostic imaging of other specified body structures: Secondary | ICD-10-CM

## 2019-10-10 NOTE — Assessment & Plan Note (Addendum)
Likely overtreated.  Stop lisinopril in the meantime.  If still lightheaded can hold metoprolol.  They can update me as needed.  Routine cautions discussed with patient.  See notes on labs. At least 30 minutes were devoted to patient care in this encounter (this can potentially include time spent reviewing the patient's file/history, interviewing and examining the patient, counseling/reviewing plan with patient, ordering referrals, ordering tests, reviewing relevant laboratory or x-ray data, and documenting the encounter).

## 2019-10-10 NOTE — Assessment & Plan Note (Signed)
Discussed.  This was related to his previous illness and this is resolved in the meantime.

## 2019-10-10 NOTE — Assessment & Plan Note (Signed)
Related to previous Covid infection.  Resolved.  Reasonable to recheck chest x-ray today.  See notes on imaging.

## 2019-10-10 NOTE — Assessment & Plan Note (Signed)
See notes on labs.  His son can check to see which meter is covered and they will let me know and we can send that in.  No change in meds at this point.

## 2019-10-11 DIAGNOSIS — J9601 Acute respiratory failure with hypoxia: Secondary | ICD-10-CM | POA: Diagnosis not present

## 2019-10-11 DIAGNOSIS — G9341 Metabolic encephalopathy: Secondary | ICD-10-CM | POA: Diagnosis not present

## 2019-10-11 DIAGNOSIS — E119 Type 2 diabetes mellitus without complications: Secondary | ICD-10-CM | POA: Diagnosis not present

## 2019-10-11 DIAGNOSIS — U071 COVID-19: Secondary | ICD-10-CM | POA: Diagnosis not present

## 2019-10-11 DIAGNOSIS — J1282 Pneumonia due to coronavirus disease 2019: Secondary | ICD-10-CM | POA: Diagnosis not present

## 2019-10-11 DIAGNOSIS — I2510 Atherosclerotic heart disease of native coronary artery without angina pectoris: Secondary | ICD-10-CM | POA: Diagnosis not present

## 2019-10-12 ENCOUNTER — Telehealth: Payer: Self-pay

## 2019-10-12 DIAGNOSIS — I2510 Atherosclerotic heart disease of native coronary artery without angina pectoris: Secondary | ICD-10-CM | POA: Diagnosis not present

## 2019-10-12 DIAGNOSIS — J1282 Pneumonia due to coronavirus disease 2019: Secondary | ICD-10-CM | POA: Diagnosis not present

## 2019-10-12 DIAGNOSIS — J9601 Acute respiratory failure with hypoxia: Secondary | ICD-10-CM | POA: Diagnosis not present

## 2019-10-12 DIAGNOSIS — U071 COVID-19: Secondary | ICD-10-CM | POA: Diagnosis not present

## 2019-10-12 DIAGNOSIS — E119 Type 2 diabetes mellitus without complications: Secondary | ICD-10-CM | POA: Diagnosis not present

## 2019-10-12 DIAGNOSIS — G9341 Metabolic encephalopathy: Secondary | ICD-10-CM | POA: Diagnosis not present

## 2019-10-12 NOTE — Telephone Encounter (Signed)
Spoke with Amy and informed her of Dr. Josefine Class message. She states the reasoning for the message was just to keep Dr. [name] in the loop. She states she has not heard back from pt's wife when his updated BS reading. She will reach out and then follow up if needed

## 2019-10-12 NOTE — Telephone Encounter (Signed)
Amy nurse with [facility] left v/m that Amy was with pt this AM. Pt saw Dr [name] and did not put lisinopril in med box.pt is presently taking Pravastatin 20 mg at hs. Pt cannot remember if took med this morning. BS was 331 after eating cereal and toast at 8 AM. Amy is on the fence to give extra meds if pt may have already taken meds. Amy will call before lunch and have pts BS taken. Pt vitals are good; BP 138/60 P 68. Pt is Asymptomatic and seems to be doing well. Amy will cb with updates on pt and wanted Dr [name] to be aware of how pt doing.

## 2019-10-12 NOTE — Telephone Encounter (Signed)
He was going to stay off lisinopril since he was previously a little lightheaded.  That blood pressure is reasonable.  I would stay off lisinopril for now.    I am not surprised he had blood sugar elevation after cereal and toast.  I would not change his medication yet since his A1c was still controlled but if he is having persistently elevated sugars then please let me know.  Thanks.

## 2019-10-14 ENCOUNTER — Other Ambulatory Visit: Payer: Self-pay | Admitting: Family Medicine

## 2019-10-15 DIAGNOSIS — J1282 Pneumonia due to coronavirus disease 2019: Secondary | ICD-10-CM | POA: Diagnosis not present

## 2019-10-15 DIAGNOSIS — G9341 Metabolic encephalopathy: Secondary | ICD-10-CM | POA: Diagnosis not present

## 2019-10-15 DIAGNOSIS — J9601 Acute respiratory failure with hypoxia: Secondary | ICD-10-CM | POA: Diagnosis not present

## 2019-10-15 DIAGNOSIS — I2510 Atherosclerotic heart disease of native coronary artery without angina pectoris: Secondary | ICD-10-CM | POA: Diagnosis not present

## 2019-10-15 DIAGNOSIS — E119 Type 2 diabetes mellitus without complications: Secondary | ICD-10-CM | POA: Diagnosis not present

## 2019-10-15 DIAGNOSIS — U071 COVID-19: Secondary | ICD-10-CM | POA: Diagnosis not present

## 2019-10-19 ENCOUNTER — Ambulatory Visit
Admission: RE | Admit: 2019-10-19 | Discharge: 2019-10-19 | Disposition: A | Discharge status: Home / Self Care | Payer: Medicare Other | Source: Ambulatory Visit | Attending: Family Medicine | Admitting: Family Medicine

## 2019-10-19 DIAGNOSIS — I2510 Atherosclerotic heart disease of native coronary artery without angina pectoris: Secondary | ICD-10-CM | POA: Diagnosis not present

## 2019-10-19 DIAGNOSIS — R9389 Abnormal findings on diagnostic imaging of other specified body structures: Secondary | ICD-10-CM

## 2019-10-19 DIAGNOSIS — J9601 Acute respiratory failure with hypoxia: Secondary | ICD-10-CM | POA: Diagnosis not present

## 2019-10-19 DIAGNOSIS — G9341 Metabolic encephalopathy: Secondary | ICD-10-CM | POA: Diagnosis not present

## 2019-10-19 DIAGNOSIS — E119 Type 2 diabetes mellitus without complications: Secondary | ICD-10-CM | POA: Diagnosis not present

## 2019-10-19 DIAGNOSIS — J1282 Pneumonia due to coronavirus disease 2019: Secondary | ICD-10-CM | POA: Diagnosis not present

## 2019-10-19 DIAGNOSIS — U071 COVID-19: Secondary | ICD-10-CM | POA: Diagnosis not present

## 2019-10-19 DIAGNOSIS — R918 Other nonspecific abnormal finding of lung field: Secondary | ICD-10-CM | POA: Diagnosis not present

## 2019-10-20 ENCOUNTER — Encounter: Payer: Self-pay | Admitting: Family Medicine

## 2019-10-20 DIAGNOSIS — I719 Aortic aneurysm of unspecified site, without rupture: Secondary | ICD-10-CM | POA: Insufficient documentation

## 2019-10-21 DIAGNOSIS — J9601 Acute respiratory failure with hypoxia: Secondary | ICD-10-CM | POA: Diagnosis not present

## 2019-10-21 DIAGNOSIS — G9341 Metabolic encephalopathy: Secondary | ICD-10-CM | POA: Diagnosis not present

## 2019-10-21 DIAGNOSIS — E119 Type 2 diabetes mellitus without complications: Secondary | ICD-10-CM | POA: Diagnosis not present

## 2019-10-21 DIAGNOSIS — J1282 Pneumonia due to coronavirus disease 2019: Secondary | ICD-10-CM | POA: Diagnosis not present

## 2019-10-21 DIAGNOSIS — I2510 Atherosclerotic heart disease of native coronary artery without angina pectoris: Secondary | ICD-10-CM | POA: Diagnosis not present

## 2019-10-21 DIAGNOSIS — U071 COVID-19: Secondary | ICD-10-CM | POA: Diagnosis not present

## 2019-10-26 DIAGNOSIS — J9601 Acute respiratory failure with hypoxia: Secondary | ICD-10-CM | POA: Diagnosis not present

## 2019-10-26 DIAGNOSIS — U071 COVID-19: Secondary | ICD-10-CM | POA: Diagnosis not present

## 2019-10-26 DIAGNOSIS — I2510 Atherosclerotic heart disease of native coronary artery without angina pectoris: Secondary | ICD-10-CM | POA: Diagnosis not present

## 2019-10-26 DIAGNOSIS — E119 Type 2 diabetes mellitus without complications: Secondary | ICD-10-CM | POA: Diagnosis not present

## 2019-10-26 DIAGNOSIS — G9341 Metabolic encephalopathy: Secondary | ICD-10-CM | POA: Diagnosis not present

## 2019-10-26 DIAGNOSIS — J1282 Pneumonia due to coronavirus disease 2019: Secondary | ICD-10-CM | POA: Diagnosis not present

## 2019-10-28 DIAGNOSIS — I2510 Atherosclerotic heart disease of native coronary artery without angina pectoris: Secondary | ICD-10-CM | POA: Diagnosis not present

## 2019-10-28 DIAGNOSIS — U071 COVID-19: Secondary | ICD-10-CM | POA: Diagnosis not present

## 2019-10-28 DIAGNOSIS — G9341 Metabolic encephalopathy: Secondary | ICD-10-CM | POA: Diagnosis not present

## 2019-10-28 DIAGNOSIS — E119 Type 2 diabetes mellitus without complications: Secondary | ICD-10-CM | POA: Diagnosis not present

## 2019-10-28 DIAGNOSIS — J1282 Pneumonia due to coronavirus disease 2019: Secondary | ICD-10-CM | POA: Diagnosis not present

## 2019-10-28 DIAGNOSIS — J9601 Acute respiratory failure with hypoxia: Secondary | ICD-10-CM | POA: Diagnosis not present

## 2019-10-29 DIAGNOSIS — Z87891 Personal history of nicotine dependence: Secondary | ICD-10-CM | POA: Diagnosis not present

## 2019-10-29 DIAGNOSIS — J1282 Pneumonia due to coronavirus disease 2019: Secondary | ICD-10-CM | POA: Diagnosis not present

## 2019-10-29 DIAGNOSIS — D649 Anemia, unspecified: Secondary | ICD-10-CM | POA: Diagnosis not present

## 2019-10-29 DIAGNOSIS — H9190 Unspecified hearing loss, unspecified ear: Secondary | ICD-10-CM | POA: Diagnosis not present

## 2019-10-29 DIAGNOSIS — I2510 Atherosclerotic heart disease of native coronary artery without angina pectoris: Secondary | ICD-10-CM | POA: Diagnosis not present

## 2019-10-29 DIAGNOSIS — Z95818 Presence of other cardiac implants and grafts: Secondary | ICD-10-CM | POA: Diagnosis not present

## 2019-10-29 DIAGNOSIS — N400 Enlarged prostate without lower urinary tract symptoms: Secondary | ICD-10-CM | POA: Diagnosis not present

## 2019-10-29 DIAGNOSIS — G9341 Metabolic encephalopathy: Secondary | ICD-10-CM | POA: Diagnosis not present

## 2019-10-29 DIAGNOSIS — J9601 Acute respiratory failure with hypoxia: Secondary | ICD-10-CM | POA: Diagnosis not present

## 2019-10-29 DIAGNOSIS — R1310 Dysphagia, unspecified: Secondary | ICD-10-CM | POA: Diagnosis not present

## 2019-10-29 DIAGNOSIS — Z7982 Long term (current) use of aspirin: Secondary | ICD-10-CM | POA: Diagnosis not present

## 2019-10-29 DIAGNOSIS — Z85828 Personal history of other malignant neoplasm of skin: Secondary | ICD-10-CM | POA: Diagnosis not present

## 2019-10-29 DIAGNOSIS — E785 Hyperlipidemia, unspecified: Secondary | ICD-10-CM | POA: Diagnosis not present

## 2019-10-29 DIAGNOSIS — Z7984 Long term (current) use of oral hypoglycemic drugs: Secondary | ICD-10-CM | POA: Diagnosis not present

## 2019-10-29 DIAGNOSIS — U071 COVID-19: Secondary | ICD-10-CM | POA: Diagnosis not present

## 2019-10-29 DIAGNOSIS — I10 Essential (primary) hypertension: Secondary | ICD-10-CM | POA: Diagnosis not present

## 2019-10-29 DIAGNOSIS — K219 Gastro-esophageal reflux disease without esophagitis: Secondary | ICD-10-CM | POA: Diagnosis not present

## 2019-10-29 DIAGNOSIS — D696 Thrombocytopenia, unspecified: Secondary | ICD-10-CM | POA: Diagnosis not present

## 2019-10-29 DIAGNOSIS — E119 Type 2 diabetes mellitus without complications: Secondary | ICD-10-CM | POA: Diagnosis not present

## 2019-10-31 ENCOUNTER — Other Ambulatory Visit: Payer: Self-pay | Admitting: Family Medicine

## 2019-10-31 DIAGNOSIS — E119 Type 2 diabetes mellitus without complications: Secondary | ICD-10-CM

## 2019-10-31 DIAGNOSIS — D649 Anemia, unspecified: Secondary | ICD-10-CM

## 2019-11-01 ENCOUNTER — Telehealth: Payer: Self-pay | Admitting: Family Medicine

## 2019-11-01 NOTE — Telephone Encounter (Signed)
Spoke with [name] (son).

## 2019-11-01 NOTE — Telephone Encounter (Signed)
Patient's Son Jovita Gamma called He stated he received a call from you. I did not see anything in the patient's chart unless looked over. Please advise

## 2019-11-02 ENCOUNTER — Telehealth (INDEPENDENT_AMBULATORY_CARE_PROVIDER_SITE_OTHER): Payer: Medicare Other | Admitting: Cardiology

## 2019-11-02 ENCOUNTER — Encounter: Payer: Self-pay | Admitting: Cardiology

## 2019-11-02 ENCOUNTER — Other Ambulatory Visit: Payer: Self-pay

## 2019-11-02 VITALS — HR 65 | Ht 64.0 in | Wt 157.0 lb

## 2019-11-02 DIAGNOSIS — W19XXXD Unspecified fall, subsequent encounter: Secondary | ICD-10-CM

## 2019-11-02 DIAGNOSIS — U071 COVID-19: Secondary | ICD-10-CM | POA: Diagnosis not present

## 2019-11-02 DIAGNOSIS — I25118 Atherosclerotic heart disease of native coronary artery with other forms of angina pectoris: Secondary | ICD-10-CM | POA: Diagnosis not present

## 2019-11-02 DIAGNOSIS — E119 Type 2 diabetes mellitus without complications: Secondary | ICD-10-CM | POA: Diagnosis not present

## 2019-11-02 DIAGNOSIS — I712 Thoracic aortic aneurysm, without rupture, unspecified: Secondary | ICD-10-CM

## 2019-11-02 DIAGNOSIS — G9341 Metabolic encephalopathy: Secondary | ICD-10-CM | POA: Diagnosis not present

## 2019-11-02 DIAGNOSIS — I119 Hypertensive heart disease without heart failure: Secondary | ICD-10-CM | POA: Diagnosis not present

## 2019-11-02 DIAGNOSIS — Z9181 History of falling: Secondary | ICD-10-CM

## 2019-11-02 DIAGNOSIS — I2510 Atherosclerotic heart disease of native coronary artery without angina pectoris: Secondary | ICD-10-CM | POA: Diagnosis not present

## 2019-11-02 DIAGNOSIS — E782 Mixed hyperlipidemia: Secondary | ICD-10-CM | POA: Diagnosis not present

## 2019-11-02 DIAGNOSIS — J9601 Acute respiratory failure with hypoxia: Secondary | ICD-10-CM | POA: Diagnosis not present

## 2019-11-02 DIAGNOSIS — J1282 Pneumonia due to coronavirus disease 2019: Secondary | ICD-10-CM | POA: Diagnosis not present

## 2019-11-02 NOTE — Progress Notes (Signed)
Virtual Visit via Video Note   This visit type was conducted due to national recommendations for restrictions regarding the COVID-19 Pandemic (e.g. social distancing) in an effort to limit this patient's exposure and mitigate transmission in our community.  Due to his co-morbid illnesses, this patient is at least at moderate risk for complications without adequate follow up.  This format is felt to be most appropriate for this patient at this time.  All issues noted in this document were discussed and addressed.  A limited physical exam was performed with this format.  Please refer to the patient's chart for his consent to telehealth for [facility].   Date:  11/02/2019   ID:  Paul Terrell, DOB 15-Apr-1931, MRN [id number]  Patient Location: Home Provider Location: Office  PCP:  Tonia Ghent, MD  Cardiologist: Dr. Bettina Gavia Electrophysiologist:  None   Evaluation Performed:  Follow-Up Visit  Chief Complaint: CAD  History of Present Illness:    Paul Terrell is a 86 y.o. male with coronary artery disease with PCI , type 2 diabetes with neuropathy hypertension and hyperlipidemia.    The patient does not have symptoms concerning for COVID-19 infection (fever, chills, cough, or new shortness of breath).  He had COVID-19 was admitted to the hospital November.  He has skilled nursing return home steadily improving but has had a few falls and now uses a walker all the time.  He complains of orthostatic lightheadedness he takes an alpha-blocker for BPH and I asked his son to purchase a digital blood pressure cuff and check his father sitting and standing several times a week and contact us if he has symptomatic hypotension.  He has mild edema at the end of the day and is wearing support hose and it is improving since his hospitalization.  No shortness of breath orthopnea chest pain palpitation or syncope.  Son promised me to look at a CTA that he had performed 10/19/2019 showing residual  groundglass opacities from COVID-19 throughout the lungs small pericardial effusion and enlargement of the ascending aorta 44 mm.  Dependently reviewed the images  Past Medical History:  Diagnosis Date  . Arthritis   . Basal cell carcinoma of scalp 12/16/07   Reexcision (Dr. Merril Abbe. Teressa Senter)  . BPH (benign prostatic hyperplasia)   . CAD (coronary artery disease) 11/01   stent placed  . Cataract   . Diabetes mellitus type II 11/01  . GERD (gastroesophageal reflux disease)    occasionally  . Hearing aid worn   . HOH (hard of hearing)   . Hyperlipemia 11/01  . Hypertension 11/01  . Ruptured disc, cervical    Neck C7 (Dr. Pearlie Oyster)   Past Surgical History:  Procedure Laterality Date  . BASAL CELL CARCINOMA EXCISION    . BRONCHIAL BRUSHINGS  05/16/2018   Procedure: ESOPHAGEAL BRUSHINGS;  Surgeon: Irving Copas., MD;  Location: [place];  Service: Gastroenterology;;  . Lillard Anes  02/2000   Dr. Janus Molder - Right  . CERVICAL FUSION    . COLONOSCOPY     2012  . COLONOSCOPY N/A 05/17/2018   Procedure: COLONOSCOPY;  Surgeon: Mansouraty, Telford Nab., MD;  Location: [place];  Service: Gastroenterology;  Laterality: N/A;  . ESOPHAGOGASTRODUODENOSCOPY (EGD) WITH PROPOFOL N/A 05/16/2018   Procedure: ESOPHAGOGASTRODUODENOSCOPY (EGD) WITH PROPOFOL ;  Surgeon: Rush Landmark Telford Nab., MD;  Location: [place];  Service: Gastroenterology;  Laterality: N/A;  . HOT HEMOSTASIS N/A 05/16/2018   Procedure: HOT HEMOSTASIS (ARGON PLASMA COAGULATION/BICAP);  Surgeon: Irving Copas.,  MD;  Location: [place];  Service: Gastroenterology;  Laterality: N/A;  . KNEE ARTHROSCOPY  09/1999   Right  . LACERATION REPAIR  09/1999   Right Hand (Dr. Fredna Dow)  . LITHOTRIPSY  1991   Dr. Elyse Jarvis  . POLYPECTOMY  05/17/2018   Procedure: POLYPECTOMY;  Surgeon: Irving Copas., MD;  Location: [place];  Service: Gastroenterology;;  . Cherre Robins CUFF REPAIR  02/05   Left      Current Meds  Medication Sig  . acetaminophen (TYLENOL) 500 MG tablet Take 500 mg by mouth every 6 (six) hours as needed (for pain or headaches).  Marland Kitchen aspirin 81 MG tablet Take 81 mg by mouth at bedtime.   . blood glucose meter kit and supplies Dispense Accu-chek meter. Check sugar once daily. DX E11.9  . Cholecalciferol (VITAMIN D-3) 5000 UNITS TABS Take 5,000 Units by mouth daily.   . ferrous sulfate 325 (65 FE) MG tablet Take 1 tablet (325 mg total) by mouth daily with breakfast.  . finasteride (PROSCAR) 5 MG tablet TAKE 1 TABLET (5 MG TOTAL) BY MOUTH DAILY.  Marland Kitchen glucose blood (ONE TOUCH ULTRA TEST) test strip TEST BLOOD SUGAR ONCE DAILY. DX: 250.00  . glyBURIDE (DIABETA) 5 MG tablet TAKE 2 TABLETS (10 MG TOTAL) DAILY WITH BREAKFAST.  Marland Kitchen Lancets (ONETOUCH ULTRASOFT) lancets Test once daily Dx 250.00   . magnesium oxide (MAG-OX) 400 MG tablet   . metFORMIN (GLUCOPHAGE) 1000 MG tablet Take 1 tablet (1,000 mg total) by mouth 2 (two) times daily.  . metoprolol succinate (TOPROL-XL) 50 MG 24 hr tablet Take 1 tablet (50 mg total) by mouth daily. Take with or immediately following a meal.  . mirtazapine (REMERON) 7.5 MG tablet   . Multiple Vitamin (MULTIVITAMIN) tablet Take 1 tablet by mouth daily.    . pantoprazole (PROTONIX) 40 MG tablet Take 1 tablet (40 mg total) by mouth daily.  . pravastatin (PRAVACHOL) 20 MG tablet TAKE 1 TABLET (20 MG TOTAL) BY MOUTH AT BEDTIME.  . tamsulosin (FLOMAX) 0.4 MG CAPS capsule TAKE 1 CAPSULE (0.4 MG TOTAL) BY MOUTH AT BEDTIME.  . vitamin B-12 (CYANOCOBALAMIN) 250 MCG tablet Take 250 mcg by mouth 2 (two) times daily.      Allergies:   Nsaids, Citrus, and Zocor [simvastatin]   Social History   Tobacco Use  . Smoking status: Former Smoker    Packs/day: 1.00    Years: 25.00    Pack years: 25.00    Types: Cigarettes    Quit date: 01/09/1976    Years since quitting: 43.8  . Smokeless tobacco: Current User    Types: Chew  . Tobacco comment: quit smoking  about 25 years ago  Substance Use Topics  . Alcohol use: No  . Drug use: No     Family Hx: The patient's family history includes Heart disease in his brother, brother, father, and mother; Hyperlipidemia in his brother; Hypertension in his brother, father, mother, and sister; Stroke in his mother.  ROS:   Please see the history of present illness.     All other systems reviewed and are negative.   Prior CV studies:   The following studies were reviewed today:    Labs/Other Tests and Data Reviewed:    EKG:  An ECG dated 08/17/2019 Hospital was personally reviewed today and demonstrated:  Sinus rhythm nonspecific ST and T wave changes  Recent Labs:  Reviewed his proBNP level is low renal function normal and mildly anemic 08/15/2019: B Natriuretic Peptide 83.1; TSH 0.966  08/19/2019: Magnesium 1.8 10/07/2019: ALT 19; BUN 12; Creatinine, Ser 0.82; Hemoglobin 11.8; Platelets 169.0; Potassium 4.7; Sodium 137   Recent Lipid Panel Lab Results  Component Value Date/Time   CHOL 128 08/17/2018 11:42 AM   TRIG 134.0 08/17/2018 11:42 AM   HDL 41.90 08/17/2018 11:42 AM   CHOLHDL 3 08/17/2018 11:42 AM   LDLCALC 59 08/17/2018 11:42 AM    Wt Readings from Last 3 Encounters:  11/02/19 157 lb (71.2 kg)  10/07/19 154 lb 14.4 oz (70.3 kg)  08/18/19 153 lb 7 oz (69.6 kg)     Objective:    Vital Signs:  Pulse 65   Ht 5' 4"  (1.626 m)   Wt 157 lb (71.2 kg)   SpO2 95%   BMI 26.95 kg/m    VITAL SIGNS:  reviewed GEN:  no acute distress EYES:  sclerae anicteric, EOMI - Extraocular Movements Intact RESPIRATORY:  normal respiratory effort, symmetric expansion CARDIOVASCULAR:  no peripheral edema SKIN:  no rash, lesions or ulcers. MUSCULOSKELETAL:  no obvious deformities. NEURO:  alert and oriented x 3, no obvious focal deficit PSYCH:  normal affect 1+ lower extremity edema ASSESSMENT & PLAN:    1. CAD stable continue current medical treatment no refills needed today is having no  angina on current treatment New York Heart Association class I and I do not think his metoprolol is playing any role with falls and orthostatic symptoms 2. Thoracic aortic aneurysm stable we will plan to reimage at the time of his next office visit no indication to consider intervention not uncommon in his age group and typically does not progress from require surgery Hypertension stable I did asked the son to check orthostatic blood pressures with his falls and lightheadedness and taking an alpha-blocker for BPH Hyperlipidemia stable continue statin [place] predominantly related to deconditioning from COVID-19 but also need to check orthostatic blood pressures COVID-19 Education: The signs and symptoms of COVID-19 were discussed with the patient and how to seek care for testing (follow up with PCP or arrange E-visit).  The importance of social distancing was discussed today.  Time:   Today, I have spent 16 minutes with the patient with telehealth technology discussing the above problems.     Medication Adjustments/Labs and Tests Ordered: Current medicines are reviewed at length with the patient today.  Concerns regarding medicines are outlined above.   Tests Ordered: No orders of the defined types were placed in this encounter.   Medication Changes: No orders of the defined types were placed in this encounter.   Follow Up:  In Person in 6 month(s)  Signed, Shirlee More, MD  11/02/2019 10:02 AM    [place]

## 2019-11-02 NOTE — Patient Instructions (Signed)
Medication Instructions:  Your physician recommends that you continue on your current medications as directed. Please refer to the Current Medication list given to you today.  *If you need a refill on your cardiac medications before your next appointment, please call your pharmacy*  Lab Work: None ordered   If you have labs (blood work) drawn today and your tests are completely normal, you will receive your results only by: Marland Kitchen MyChart Message (if you have MyChart) OR . A paper copy in the mail If you have any lab test that is abnormal or we need to change your treatment, we will call you to review the results.  Testing/Procedures: None ordered   Follow-Up: At [facility], you and your health needs are our priority.  As part of our continuing mission to provide you with exceptional heart care, we have created designated Provider Care Teams.  These Care Teams include your primary Cardiologist (physician) and Advanced Practice Providers (APPs -  Physician Assistants and Nurse Practitioners) who all work together to provide you with the care you need, when you need it.  Your next appointment:   6 month(s)  The format for your next appointment:   In Person  Provider:   Shirlee More, MD  Other Instructions None

## 2019-11-03 DIAGNOSIS — E785 Hyperlipidemia, unspecified: Secondary | ICD-10-CM | POA: Diagnosis not present

## 2019-11-03 DIAGNOSIS — H9190 Unspecified hearing loss, unspecified ear: Secondary | ICD-10-CM | POA: Diagnosis not present

## 2019-11-03 DIAGNOSIS — Z7982 Long term (current) use of aspirin: Secondary | ICD-10-CM

## 2019-11-03 DIAGNOSIS — I2510 Atherosclerotic heart disease of native coronary artery without angina pectoris: Secondary | ICD-10-CM

## 2019-11-03 DIAGNOSIS — E119 Type 2 diabetes mellitus without complications: Secondary | ICD-10-CM

## 2019-11-03 DIAGNOSIS — G9341 Metabolic encephalopathy: Secondary | ICD-10-CM | POA: Diagnosis not present

## 2019-11-03 DIAGNOSIS — U071 COVID-19: Secondary | ICD-10-CM | POA: Diagnosis not present

## 2019-11-03 DIAGNOSIS — Z87891 Personal history of nicotine dependence: Secondary | ICD-10-CM

## 2019-11-03 DIAGNOSIS — D649 Anemia, unspecified: Secondary | ICD-10-CM | POA: Diagnosis not present

## 2019-11-03 DIAGNOSIS — R1310 Dysphagia, unspecified: Secondary | ICD-10-CM

## 2019-11-03 DIAGNOSIS — K219 Gastro-esophageal reflux disease without esophagitis: Secondary | ICD-10-CM | POA: Diagnosis not present

## 2019-11-03 DIAGNOSIS — Z85828 Personal history of other malignant neoplasm of skin: Secondary | ICD-10-CM

## 2019-11-03 DIAGNOSIS — J9601 Acute respiratory failure with hypoxia: Secondary | ICD-10-CM | POA: Diagnosis not present

## 2019-11-03 DIAGNOSIS — D696 Thrombocytopenia, unspecified: Secondary | ICD-10-CM

## 2019-11-03 DIAGNOSIS — I10 Essential (primary) hypertension: Secondary | ICD-10-CM | POA: Diagnosis not present

## 2019-11-03 DIAGNOSIS — N400 Enlarged prostate without lower urinary tract symptoms: Secondary | ICD-10-CM | POA: Diagnosis not present

## 2019-11-03 DIAGNOSIS — J1282 Pneumonia due to coronavirus disease 2019: Secondary | ICD-10-CM | POA: Diagnosis not present

## 2019-11-03 DIAGNOSIS — Z95818 Presence of other cardiac implants and grafts: Secondary | ICD-10-CM

## 2019-11-03 DIAGNOSIS — Z7984 Long term (current) use of oral hypoglycemic drugs: Secondary | ICD-10-CM

## 2019-11-04 ENCOUNTER — Other Ambulatory Visit: Payer: Medicare Other

## 2019-11-04 DIAGNOSIS — J1282 Pneumonia due to coronavirus disease 2019: Secondary | ICD-10-CM | POA: Diagnosis not present

## 2019-11-04 DIAGNOSIS — J9601 Acute respiratory failure with hypoxia: Secondary | ICD-10-CM | POA: Diagnosis not present

## 2019-11-04 DIAGNOSIS — U071 COVID-19: Secondary | ICD-10-CM | POA: Diagnosis not present

## 2019-11-04 DIAGNOSIS — G9341 Metabolic encephalopathy: Secondary | ICD-10-CM | POA: Diagnosis not present

## 2019-11-04 DIAGNOSIS — E119 Type 2 diabetes mellitus without complications: Secondary | ICD-10-CM | POA: Diagnosis not present

## 2019-11-04 DIAGNOSIS — I2510 Atherosclerotic heart disease of native coronary artery without angina pectoris: Secondary | ICD-10-CM | POA: Diagnosis not present

## 2019-11-09 ENCOUNTER — Ambulatory Visit: Payer: Medicare Other | Admitting: Family Medicine

## 2019-11-09 ENCOUNTER — Telehealth: Payer: Self-pay

## 2019-11-09 DIAGNOSIS — E119 Type 2 diabetes mellitus without complications: Secondary | ICD-10-CM | POA: Diagnosis not present

## 2019-11-09 DIAGNOSIS — U071 COVID-19: Secondary | ICD-10-CM | POA: Diagnosis not present

## 2019-11-09 DIAGNOSIS — J1282 Pneumonia due to coronavirus disease 2019: Secondary | ICD-10-CM | POA: Diagnosis not present

## 2019-11-09 DIAGNOSIS — G9341 Metabolic encephalopathy: Secondary | ICD-10-CM | POA: Diagnosis not present

## 2019-11-09 DIAGNOSIS — I2510 Atherosclerotic heart disease of native coronary artery without angina pectoris: Secondary | ICD-10-CM | POA: Diagnosis not present

## 2019-11-09 DIAGNOSIS — J9601 Acute respiratory failure with hypoxia: Secondary | ICD-10-CM | POA: Diagnosis not present

## 2019-11-09 NOTE — Telephone Encounter (Signed)
I would cut back on the sweets at night and see about getting repeat BP measurement on a different cuff.  Please update me at that point and we'll go from there.  Thanks.

## 2019-11-09 NOTE — Telephone Encounter (Signed)
Amy Angrave nurse with [facility] left v/m with update on FBS and BP. pts may have been getting up during the night and eating cookies or other sweets. FBS has been 159                          225                          274                          185    168    189    210    238 FBS 11/09/19   242   BP has been ranging 120/62 -170/70 after Lisinopril was stopped. Today BP 150/60. Amy is questioning the accuracy of the BP cuff and may try to get pt a different cuff for pt in the home. Other than the above everything looks great.

## 2019-11-09 NOTE — Telephone Encounter (Signed)
Son advised

## 2019-11-12 DIAGNOSIS — Z961 Presence of intraocular lens: Secondary | ICD-10-CM | POA: Diagnosis not present

## 2019-11-12 DIAGNOSIS — J9601 Acute respiratory failure with hypoxia: Secondary | ICD-10-CM | POA: Diagnosis not present

## 2019-11-12 DIAGNOSIS — G9341 Metabolic encephalopathy: Secondary | ICD-10-CM | POA: Diagnosis not present

## 2019-11-12 DIAGNOSIS — I2510 Atherosclerotic heart disease of native coronary artery without angina pectoris: Secondary | ICD-10-CM | POA: Diagnosis not present

## 2019-11-12 DIAGNOSIS — H353131 Nonexudative age-related macular degeneration, bilateral, early dry stage: Secondary | ICD-10-CM | POA: Diagnosis not present

## 2019-11-12 DIAGNOSIS — U071 COVID-19: Secondary | ICD-10-CM | POA: Diagnosis not present

## 2019-11-12 DIAGNOSIS — H52203 Unspecified astigmatism, bilateral: Secondary | ICD-10-CM | POA: Diagnosis not present

## 2019-11-12 DIAGNOSIS — E119 Type 2 diabetes mellitus without complications: Secondary | ICD-10-CM | POA: Diagnosis not present

## 2019-11-12 DIAGNOSIS — J1282 Pneumonia due to coronavirus disease 2019: Secondary | ICD-10-CM | POA: Diagnosis not present

## 2019-11-12 LAB — HM DIABETES EYE EXAM

## 2019-11-15 ENCOUNTER — Other Ambulatory Visit: Payer: Self-pay | Admitting: Family Medicine

## 2019-11-16 DIAGNOSIS — U071 COVID-19: Secondary | ICD-10-CM | POA: Diagnosis not present

## 2019-11-16 DIAGNOSIS — E119 Type 2 diabetes mellitus without complications: Secondary | ICD-10-CM | POA: Diagnosis not present

## 2019-11-16 DIAGNOSIS — J9601 Acute respiratory failure with hypoxia: Secondary | ICD-10-CM | POA: Diagnosis not present

## 2019-11-16 DIAGNOSIS — G9341 Metabolic encephalopathy: Secondary | ICD-10-CM | POA: Diagnosis not present

## 2019-11-16 DIAGNOSIS — J1282 Pneumonia due to coronavirus disease 2019: Secondary | ICD-10-CM | POA: Diagnosis not present

## 2019-11-16 DIAGNOSIS — I2510 Atherosclerotic heart disease of native coronary artery without angina pectoris: Secondary | ICD-10-CM | POA: Diagnosis not present

## 2019-11-18 DIAGNOSIS — G9341 Metabolic encephalopathy: Secondary | ICD-10-CM | POA: Diagnosis not present

## 2019-11-18 DIAGNOSIS — E119 Type 2 diabetes mellitus without complications: Secondary | ICD-10-CM | POA: Diagnosis not present

## 2019-11-18 DIAGNOSIS — J9601 Acute respiratory failure with hypoxia: Secondary | ICD-10-CM | POA: Diagnosis not present

## 2019-11-18 DIAGNOSIS — I2510 Atherosclerotic heart disease of native coronary artery without angina pectoris: Secondary | ICD-10-CM | POA: Diagnosis not present

## 2019-11-18 DIAGNOSIS — U071 COVID-19: Secondary | ICD-10-CM | POA: Diagnosis not present

## 2019-11-18 DIAGNOSIS — J1282 Pneumonia due to coronavirus disease 2019: Secondary | ICD-10-CM | POA: Diagnosis not present

## 2019-11-23 DIAGNOSIS — J1282 Pneumonia due to coronavirus disease 2019: Secondary | ICD-10-CM | POA: Diagnosis not present

## 2019-11-23 DIAGNOSIS — I2510 Atherosclerotic heart disease of native coronary artery without angina pectoris: Secondary | ICD-10-CM | POA: Diagnosis not present

## 2019-11-23 DIAGNOSIS — J9601 Acute respiratory failure with hypoxia: Secondary | ICD-10-CM | POA: Diagnosis not present

## 2019-11-23 DIAGNOSIS — G9341 Metabolic encephalopathy: Secondary | ICD-10-CM | POA: Diagnosis not present

## 2019-11-23 DIAGNOSIS — E119 Type 2 diabetes mellitus without complications: Secondary | ICD-10-CM | POA: Diagnosis not present

## 2019-11-23 DIAGNOSIS — U071 COVID-19: Secondary | ICD-10-CM | POA: Diagnosis not present

## 2019-11-23 NOTE — Telephone Encounter (Signed)
I am hesitant to change his BP meds with his DBP in the 70s and SBP occ down in the 130s.  I would expect some variation, esp with SBP, but unless he has consistent elevations I wouldn't change his meds.    I need extra information about his sugar readings before we make any changes.   Should be okay to take melatonin 5mg  at night.  Thanks.

## 2019-11-23 NOTE — Telephone Encounter (Signed)
Spoke with Amy with [facility] and advised.  Wife will record daily BP and BS readings and will bring them in at the next OV scheduled in late March.

## 2019-11-23 NOTE — Telephone Encounter (Signed)
Amy, Nurse with [facility] called back today to give update on patient  She stated that when she checked the patient's BP today it was 180/75  Amy stated that the wife checked the BP on the 25th and it was 138/75.  Amy checked the accuracy of the BP machine the wife used and stated it does match up with the BP that the nurse gets.  Amy also stated that the patient had a therapist in the home on the 23rd and the BP was 168/70.    Patient's blood sugars are still running high.    Amy stated that the patient is also having trouble sleeping at night and she would like to know if it would be okay for the patient to take OTC melatonin to help    Amy's Call back # [phone]

## 2019-11-24 NOTE — Telephone Encounter (Signed)
Noted. Thanks.

## 2019-11-25 ENCOUNTER — Telehealth: Payer: Self-pay | Admitting: Family Medicine

## 2019-11-25 DIAGNOSIS — E119 Type 2 diabetes mellitus without complications: Secondary | ICD-10-CM | POA: Diagnosis not present

## 2019-11-25 DIAGNOSIS — U071 COVID-19: Secondary | ICD-10-CM | POA: Diagnosis not present

## 2019-11-25 DIAGNOSIS — J1282 Pneumonia due to coronavirus disease 2019: Secondary | ICD-10-CM | POA: Diagnosis not present

## 2019-11-25 DIAGNOSIS — J9601 Acute respiratory failure with hypoxia: Secondary | ICD-10-CM | POA: Diagnosis not present

## 2019-11-25 DIAGNOSIS — G9341 Metabolic encephalopathy: Secondary | ICD-10-CM | POA: Diagnosis not present

## 2019-11-25 DIAGNOSIS — I2510 Atherosclerotic heart disease of native coronary artery without angina pectoris: Secondary | ICD-10-CM | POA: Diagnosis not present

## 2019-11-25 NOTE — Telephone Encounter (Signed)
Left detailed message on voicemail.  

## 2019-11-25 NOTE — Telephone Encounter (Signed)
Paul Terrell with [facility] requesting VO for recert of home PT Frequency will be for 1x week for 4 weeks and 1 x every other week for 4 weeks.   Requesting a call back for VO, can leave detailed message.   Please advise, thanks.

## 2019-11-28 DIAGNOSIS — D649 Anemia, unspecified: Secondary | ICD-10-CM | POA: Diagnosis not present

## 2019-11-28 DIAGNOSIS — H9190 Unspecified hearing loss, unspecified ear: Secondary | ICD-10-CM | POA: Diagnosis not present

## 2019-11-28 DIAGNOSIS — K219 Gastro-esophageal reflux disease without esophagitis: Secondary | ICD-10-CM | POA: Diagnosis not present

## 2019-11-28 DIAGNOSIS — I10 Essential (primary) hypertension: Secondary | ICD-10-CM | POA: Diagnosis not present

## 2019-11-28 DIAGNOSIS — J1282 Pneumonia due to coronavirus disease 2019: Secondary | ICD-10-CM | POA: Diagnosis not present

## 2019-11-28 DIAGNOSIS — Z7984 Long term (current) use of oral hypoglycemic drugs: Secondary | ICD-10-CM | POA: Diagnosis not present

## 2019-11-28 DIAGNOSIS — Z7982 Long term (current) use of aspirin: Secondary | ICD-10-CM | POA: Diagnosis not present

## 2019-11-28 DIAGNOSIS — E785 Hyperlipidemia, unspecified: Secondary | ICD-10-CM | POA: Diagnosis not present

## 2019-11-28 DIAGNOSIS — I2510 Atherosclerotic heart disease of native coronary artery without angina pectoris: Secondary | ICD-10-CM | POA: Diagnosis not present

## 2019-11-28 DIAGNOSIS — Z7901 Long term (current) use of anticoagulants: Secondary | ICD-10-CM | POA: Diagnosis not present

## 2019-11-28 DIAGNOSIS — U071 COVID-19: Secondary | ICD-10-CM | POA: Diagnosis not present

## 2019-11-28 DIAGNOSIS — Z9181 History of falling: Secondary | ICD-10-CM | POA: Diagnosis not present

## 2019-11-28 DIAGNOSIS — E119 Type 2 diabetes mellitus without complications: Secondary | ICD-10-CM | POA: Diagnosis not present

## 2019-11-28 DIAGNOSIS — D696 Thrombocytopenia, unspecified: Secondary | ICD-10-CM | POA: Diagnosis not present

## 2019-12-02 DIAGNOSIS — I10 Essential (primary) hypertension: Secondary | ICD-10-CM | POA: Diagnosis not present

## 2019-12-02 DIAGNOSIS — E119 Type 2 diabetes mellitus without complications: Secondary | ICD-10-CM | POA: Diagnosis not present

## 2019-12-02 DIAGNOSIS — I2510 Atherosclerotic heart disease of native coronary artery without angina pectoris: Secondary | ICD-10-CM | POA: Diagnosis not present

## 2019-12-02 DIAGNOSIS — J1282 Pneumonia due to coronavirus disease 2019: Secondary | ICD-10-CM | POA: Diagnosis not present

## 2019-12-02 DIAGNOSIS — E785 Hyperlipidemia, unspecified: Secondary | ICD-10-CM | POA: Diagnosis not present

## 2019-12-02 DIAGNOSIS — U071 COVID-19: Secondary | ICD-10-CM | POA: Diagnosis not present

## 2019-12-06 DIAGNOSIS — L298 Other pruritus: Secondary | ICD-10-CM | POA: Diagnosis not present

## 2019-12-06 DIAGNOSIS — L821 Other seborrheic keratosis: Secondary | ICD-10-CM | POA: Diagnosis not present

## 2019-12-09 DIAGNOSIS — I10 Essential (primary) hypertension: Secondary | ICD-10-CM | POA: Diagnosis not present

## 2019-12-09 DIAGNOSIS — U071 COVID-19: Secondary | ICD-10-CM | POA: Diagnosis not present

## 2019-12-09 DIAGNOSIS — E119 Type 2 diabetes mellitus without complications: Secondary | ICD-10-CM | POA: Diagnosis not present

## 2019-12-09 DIAGNOSIS — E785 Hyperlipidemia, unspecified: Secondary | ICD-10-CM | POA: Diagnosis not present

## 2019-12-09 DIAGNOSIS — I2510 Atherosclerotic heart disease of native coronary artery without angina pectoris: Secondary | ICD-10-CM | POA: Diagnosis not present

## 2019-12-09 DIAGNOSIS — J1282 Pneumonia due to coronavirus disease 2019: Secondary | ICD-10-CM | POA: Diagnosis not present

## 2019-12-14 ENCOUNTER — Other Ambulatory Visit (INDEPENDENT_AMBULATORY_CARE_PROVIDER_SITE_OTHER): Payer: Medicare Other

## 2019-12-14 ENCOUNTER — Other Ambulatory Visit: Payer: Self-pay

## 2019-12-14 DIAGNOSIS — D649 Anemia, unspecified: Secondary | ICD-10-CM

## 2019-12-14 DIAGNOSIS — E119 Type 2 diabetes mellitus without complications: Secondary | ICD-10-CM | POA: Diagnosis not present

## 2019-12-14 LAB — CBC WITH DIFFERENTIAL/PLATELET
Basophils Absolute: 0.1 10*3/uL (ref 0.0–0.1)
Basophils Relative: 0.8 % (ref 0.0–3.0)
Eosinophils Absolute: 0.2 10*3/uL (ref 0.0–0.7)
Eosinophils Relative: 2.6 % (ref 0.0–5.0)
HCT: 35.4 % — ABNORMAL LOW (ref 39.0–52.0)
Hemoglobin: 11.9 g/dL — ABNORMAL LOW (ref 13.0–17.0)
Lymphocytes Relative: 40.2 % (ref 12.0–46.0)
Lymphs Abs: 2.6 10*3/uL (ref 0.7–4.0)
MCHC: 33.7 g/dL (ref 30.0–36.0)
MCV: 87.9 fl (ref 78.0–100.0)
Monocytes Absolute: 0.7 10*3/uL (ref 0.1–1.0)
Monocytes Relative: 10.7 % (ref 3.0–12.0)
Neutro Abs: 2.9 10*3/uL (ref 1.4–7.7)
Neutrophils Relative %: 45.7 % (ref 43.0–77.0)
Platelets: 192 10*3/uL (ref 150.0–400.0)
RBC: 4.03 Mil/uL — ABNORMAL LOW (ref 4.22–5.81)
RDW: 14.1 % (ref 11.5–15.5)
WBC: 6.4 10*3/uL (ref 4.0–10.5)

## 2019-12-14 LAB — LIPID PANEL
Cholesterol: 156 mg/dL (ref 0–200)
HDL: 44.5 mg/dL (ref >39.00)
LDL Cholesterol: 86 mg/dL (ref 0–99)
NonHDL: 111.59
Total CHOL/HDL Ratio: 4
Triglycerides: 130 mg/dL (ref 0.0–149.0)
VLDL: 26 mg/dL (ref 0.0–40.0)

## 2019-12-14 LAB — COMPREHENSIVE METABOLIC PANEL
ALT: 22 U/L (ref 0–53)
AST: 23 U/L (ref 0–37)
Albumin: 4.1 g/dL (ref 3.5–5.2)
Alkaline Phosphatase: 58 U/L (ref 39–117)
BUN: 13 mg/dL (ref 6–23)
CO2: 31 mEq/L (ref 19–32)
Calcium: 9.3 mg/dL (ref 8.4–10.5)
Chloride: 101 mEq/L (ref 96–112)
Creatinine, Ser: 0.95 mg/dL (ref 0.40–1.50)
GFR: 75.05 mL/min (ref >60.00)
Glucose, Bld: 176 mg/dL — ABNORMAL HIGH (ref 70–99)
Potassium: 4.4 mEq/L (ref 3.5–5.1)
Sodium: 136 mEq/L (ref 135–145)
Total Bilirubin: 0.6 mg/dL (ref 0.2–1.2)
Total Protein: 6.3 g/dL (ref 6.0–8.3)

## 2019-12-14 LAB — IRON: Iron: 54 ug/dL (ref 42–165)

## 2019-12-14 LAB — HEMOGLOBIN A1C: Hgb A1c MFr Bld: 7.4 % — ABNORMAL HIGH (ref 4.6–6.5)

## 2019-12-16 ENCOUNTER — Other Ambulatory Visit: Payer: Self-pay | Admitting: Family Medicine

## 2019-12-16 DIAGNOSIS — E119 Type 2 diabetes mellitus without complications: Secondary | ICD-10-CM | POA: Diagnosis not present

## 2019-12-16 DIAGNOSIS — I2510 Atherosclerotic heart disease of native coronary artery without angina pectoris: Secondary | ICD-10-CM | POA: Diagnosis not present

## 2019-12-16 DIAGNOSIS — U071 COVID-19: Secondary | ICD-10-CM | POA: Diagnosis not present

## 2019-12-16 DIAGNOSIS — I10 Essential (primary) hypertension: Secondary | ICD-10-CM | POA: Diagnosis not present

## 2019-12-16 DIAGNOSIS — E785 Hyperlipidemia, unspecified: Secondary | ICD-10-CM | POA: Diagnosis not present

## 2019-12-16 DIAGNOSIS — J1282 Pneumonia due to coronavirus disease 2019: Secondary | ICD-10-CM | POA: Diagnosis not present

## 2019-12-21 ENCOUNTER — Other Ambulatory Visit: Payer: Self-pay

## 2019-12-21 ENCOUNTER — Ambulatory Visit (INDEPENDENT_AMBULATORY_CARE_PROVIDER_SITE_OTHER): Payer: Medicare Other | Admitting: Family Medicine

## 2019-12-21 ENCOUNTER — Encounter: Payer: Self-pay | Admitting: Family Medicine

## 2019-12-21 VITALS — BP 126/60 | HR 72 | Temp 97.2°F | Ht 64.0 in | Wt 169.0 lb

## 2019-12-21 DIAGNOSIS — I10 Essential (primary) hypertension: Secondary | ICD-10-CM

## 2019-12-21 DIAGNOSIS — E782 Mixed hyperlipidemia: Secondary | ICD-10-CM

## 2019-12-21 DIAGNOSIS — D649 Anemia, unspecified: Secondary | ICD-10-CM

## 2019-12-21 DIAGNOSIS — N400 Enlarged prostate without lower urinary tract symptoms: Secondary | ICD-10-CM

## 2019-12-21 DIAGNOSIS — E119 Type 2 diabetes mellitus without complications: Secondary | ICD-10-CM | POA: Diagnosis not present

## 2019-12-21 DIAGNOSIS — I25118 Atherosclerotic heart disease of native coronary artery with other forms of angina pectoris: Secondary | ICD-10-CM | POA: Diagnosis not present

## 2019-12-21 DIAGNOSIS — J1282 Pneumonia due to coronavirus disease 2019: Secondary | ICD-10-CM | POA: Diagnosis not present

## 2019-12-21 DIAGNOSIS — R2681 Unsteadiness on feet: Secondary | ICD-10-CM

## 2019-12-21 DIAGNOSIS — U071 COVID-19: Secondary | ICD-10-CM | POA: Diagnosis not present

## 2019-12-21 DIAGNOSIS — I2510 Atherosclerotic heart disease of native coronary artery without angina pectoris: Secondary | ICD-10-CM | POA: Diagnosis not present

## 2019-12-21 DIAGNOSIS — E785 Hyperlipidemia, unspecified: Secondary | ICD-10-CM | POA: Diagnosis not present

## 2019-12-21 MED ORDER — PANTOPRAZOLE SODIUM 40 MG PO TBEC: 40.0000 mg | DELAYED_RELEASE_TABLET | Freq: Every day | ORAL | 3 refills | Status: DC

## 2019-12-21 NOTE — Progress Notes (Signed)
This visit occurred during the SARS-CoV-2 public health emergency.  Safety protocols were in place, including screening questions prior to the visit, additional usage of staff PPE, and extensive cleaning of exam room while observing appropriate contact time as indicated for disinfecting solutions.  Here today with his son, this is usual.  Diabetes:  Using medications without difficulties:he has had some GI upset, still on metformin and glyburide.  Less GI upset in the last few days.  pepto bismol helped.   Hypoglycemic episodes:no Hyperglycemic episodes: see below.   Feet problems: some occ tingling in the feet, episodically.   Blood Sugars averaging: ~200 recently.   eye exam within last year:  Done about 2 weeks ago per patient report.   A1c reasonable.  D/w pt about cutting back on ice cream, diet discussed.  BPH. Still on finasteride and flomax.  Some nocturia.  Noted more in the last 6-8 months.  x2 per night.  Not especially bothersome.  He has cut back on fluids prior to bed.  Stream is slower then prev with incomplete voiding/starting and stopping.  Hypertension:    Using medication without problems or lightheadedness:  Chest pain with exertion: no Edema:some BLE edema noted most days.  He treid compression stockings but had trouble getting them on.   Short of breath: with sig exertion.  He can still push a wheelbarrow around the yard.   Labs d/w pt. Still on statin.  Still on metoprolol.  He is frustrated with his overall functional capacity, some of which would be age expected/appropriate.  He is still doing PT at home.  This is reasonable.  Discussed.  Elevated Cholesterol: Using medications without problems: yes Muscle aches: no  Diet compliance: encouraged.   Exercise: encouraged as tolerated.   Still on statin.  H/o IDA, still on iron. HGB 11.9.  No blood seen in stool.  Compliant with iron.  Routine vaccines d/w pt.    PMH and SH reviewed  Meds, vitals, and  allergies reviewed.   ROS: Per HPI unless specifically indicated in ROS section   GEN: nad, alert and oriented HEENT: ncat NECK: supple w/o LA CV: rrr. PULM: ctab, no inc wob ABD: soft, +bs EXT: Trace  BLE edema SKIN: no acute rash  Diabetic foot exam: Normal inspection No skin breakdown No calluses  Normal DP pulses Normal sensation to light touch and monofilament Nails normal

## 2019-12-21 NOTE — Patient Instructions (Addendum)
Stop magnesium then cut the metformin back to 1 tab a day if you still have GI upset.  If not better, then let me know.   Cut out the salt and let me know if the swelling doesn't get better.  Either way, update me next week.  Plan on recheck in about 4 months with labs prior to the visit if possible.  Take care.  Glad to see you.

## 2019-12-22 NOTE — Assessment & Plan Note (Signed)
A1c reasonable.  D/w pt about cutting back on ice cream, diet discussed.  He can cut back on magnesium and see if his GI upset improves.  If he continues to have GI upset that he can cut back on Metformin and then update me as needed.  See after visit summary.  Recheck periodically.  He agrees with plan.

## 2019-12-22 NOTE — Assessment & Plan Note (Signed)
Still on iron.  Hemoglobin reasonable at 11.9.  No blood seen in stool.  Would continue iron and we can recheck periodically.  He agrees.

## 2019-12-22 NOTE — Assessment & Plan Note (Signed)
We talked about limiting fluids prior to bed.  He is not at the point of wanting to pursue other interventions at this point.  Would continue as is for now.  He agrees.

## 2019-12-22 NOTE — Assessment & Plan Note (Signed)
Continue pravastatin labs discussed with patient.  He agrees with plan.

## 2019-12-22 NOTE — Assessment & Plan Note (Signed)
Before we change his medications I would like him to cut back on salt and see if his lower extremity edema improves.  No change in meds otherwise at this point.  He agrees with plan.  He will update me as needed.

## 2019-12-22 NOTE — Assessment & Plan Note (Signed)
He is walking with a cane.  Routine cautions discussed with patient.  I want him to continue with PT at home.  He agrees.

## 2019-12-28 DIAGNOSIS — E785 Hyperlipidemia, unspecified: Secondary | ICD-10-CM | POA: Diagnosis not present

## 2019-12-28 DIAGNOSIS — I2510 Atherosclerotic heart disease of native coronary artery without angina pectoris: Secondary | ICD-10-CM | POA: Diagnosis not present

## 2019-12-28 DIAGNOSIS — D649 Anemia, unspecified: Secondary | ICD-10-CM | POA: Diagnosis not present

## 2019-12-28 DIAGNOSIS — Z7982 Long term (current) use of aspirin: Secondary | ICD-10-CM | POA: Diagnosis not present

## 2019-12-28 DIAGNOSIS — Z7901 Long term (current) use of anticoagulants: Secondary | ICD-10-CM | POA: Diagnosis not present

## 2019-12-28 DIAGNOSIS — J1282 Pneumonia due to coronavirus disease 2019: Secondary | ICD-10-CM | POA: Diagnosis not present

## 2019-12-28 DIAGNOSIS — Z7984 Long term (current) use of oral hypoglycemic drugs: Secondary | ICD-10-CM | POA: Diagnosis not present

## 2019-12-28 DIAGNOSIS — Z9181 History of falling: Secondary | ICD-10-CM | POA: Diagnosis not present

## 2019-12-28 DIAGNOSIS — I10 Essential (primary) hypertension: Secondary | ICD-10-CM | POA: Diagnosis not present

## 2019-12-28 DIAGNOSIS — K219 Gastro-esophageal reflux disease without esophagitis: Secondary | ICD-10-CM | POA: Diagnosis not present

## 2019-12-28 DIAGNOSIS — H9190 Unspecified hearing loss, unspecified ear: Secondary | ICD-10-CM | POA: Diagnosis not present

## 2019-12-28 DIAGNOSIS — U071 COVID-19: Secondary | ICD-10-CM | POA: Diagnosis not present

## 2019-12-28 DIAGNOSIS — E119 Type 2 diabetes mellitus without complications: Secondary | ICD-10-CM | POA: Diagnosis not present

## 2019-12-28 DIAGNOSIS — D696 Thrombocytopenia, unspecified: Secondary | ICD-10-CM | POA: Diagnosis not present

## 2020-01-03 ENCOUNTER — Encounter: Payer: Self-pay | Admitting: Family Medicine

## 2020-01-03 NOTE — Progress Notes (Signed)
[facility]/thx dmf

## 2020-01-09 DIAGNOSIS — D649 Anemia, unspecified: Secondary | ICD-10-CM

## 2020-01-09 DIAGNOSIS — I2510 Atherosclerotic heart disease of native coronary artery without angina pectoris: Secondary | ICD-10-CM | POA: Diagnosis not present

## 2020-01-09 DIAGNOSIS — D696 Thrombocytopenia, unspecified: Secondary | ICD-10-CM | POA: Diagnosis not present

## 2020-01-09 DIAGNOSIS — I10 Essential (primary) hypertension: Secondary | ICD-10-CM

## 2020-01-09 DIAGNOSIS — K219 Gastro-esophageal reflux disease without esophagitis: Secondary | ICD-10-CM | POA: Diagnosis not present

## 2020-01-09 DIAGNOSIS — E119 Type 2 diabetes mellitus without complications: Secondary | ICD-10-CM | POA: Diagnosis not present

## 2020-01-09 DIAGNOSIS — Z7982 Long term (current) use of aspirin: Secondary | ICD-10-CM

## 2020-01-09 DIAGNOSIS — H9190 Unspecified hearing loss, unspecified ear: Secondary | ICD-10-CM

## 2020-01-09 DIAGNOSIS — Z7984 Long term (current) use of oral hypoglycemic drugs: Secondary | ICD-10-CM | POA: Diagnosis not present

## 2020-01-09 DIAGNOSIS — J1282 Pneumonia due to coronavirus disease 2019: Secondary | ICD-10-CM | POA: Diagnosis not present

## 2020-01-09 DIAGNOSIS — U071 COVID-19: Secondary | ICD-10-CM | POA: Diagnosis not present

## 2020-01-09 DIAGNOSIS — Z9181 History of falling: Secondary | ICD-10-CM

## 2020-01-09 DIAGNOSIS — E785 Hyperlipidemia, unspecified: Secondary | ICD-10-CM

## 2020-01-09 DIAGNOSIS — Z7901 Long term (current) use of anticoagulants: Secondary | ICD-10-CM | POA: Diagnosis not present

## 2020-01-12 ENCOUNTER — Other Ambulatory Visit: Payer: Self-pay | Admitting: Family Medicine

## 2020-01-14 DIAGNOSIS — U071 COVID-19: Secondary | ICD-10-CM | POA: Diagnosis not present

## 2020-01-14 DIAGNOSIS — I2510 Atherosclerotic heart disease of native coronary artery without angina pectoris: Secondary | ICD-10-CM | POA: Diagnosis not present

## 2020-01-14 DIAGNOSIS — E119 Type 2 diabetes mellitus without complications: Secondary | ICD-10-CM | POA: Diagnosis not present

## 2020-01-14 DIAGNOSIS — I10 Essential (primary) hypertension: Secondary | ICD-10-CM | POA: Diagnosis not present

## 2020-01-14 DIAGNOSIS — E785 Hyperlipidemia, unspecified: Secondary | ICD-10-CM | POA: Diagnosis not present

## 2020-01-14 DIAGNOSIS — J1282 Pneumonia due to coronavirus disease 2019: Secondary | ICD-10-CM | POA: Diagnosis not present

## 2020-01-17 ENCOUNTER — Emergency Department (HOSPITAL_COMMUNITY)
Admission: EM | Admit: 2020-01-17 | Discharge: 2020-01-18 | Disposition: A | Discharge status: Home / Self Care | Payer: Medicare Other | Attending: Emergency Medicine | Admitting: Emergency Medicine

## 2020-01-17 ENCOUNTER — Encounter (HOSPITAL_COMMUNITY): Payer: Self-pay | Admitting: Emergency Medicine

## 2020-01-17 ENCOUNTER — Other Ambulatory Visit: Payer: Self-pay

## 2020-01-17 ENCOUNTER — Emergency Department (HOSPITAL_COMMUNITY): Payer: Medicare Other

## 2020-01-17 DIAGNOSIS — I10 Essential (primary) hypertension: Secondary | ICD-10-CM | POA: Diagnosis not present

## 2020-01-17 DIAGNOSIS — Z8616 Personal history of covid-19: Secondary | ICD-10-CM | POA: Insufficient documentation

## 2020-01-17 DIAGNOSIS — U071 COVID-19: Secondary | ICD-10-CM | POA: Diagnosis not present

## 2020-01-17 DIAGNOSIS — R42 Dizziness and giddiness: Secondary | ICD-10-CM

## 2020-01-17 DIAGNOSIS — R079 Chest pain, unspecified: Secondary | ICD-10-CM | POA: Diagnosis not present

## 2020-01-17 DIAGNOSIS — Z7982 Long term (current) use of aspirin: Secondary | ICD-10-CM | POA: Diagnosis not present

## 2020-01-17 DIAGNOSIS — R0902 Hypoxemia: Secondary | ICD-10-CM | POA: Diagnosis not present

## 2020-01-17 DIAGNOSIS — Z79899 Other long term (current) drug therapy: Secondary | ICD-10-CM | POA: Insufficient documentation

## 2020-01-17 DIAGNOSIS — E119 Type 2 diabetes mellitus without complications: Secondary | ICD-10-CM | POA: Diagnosis not present

## 2020-01-17 DIAGNOSIS — Z87891 Personal history of nicotine dependence: Secondary | ICD-10-CM | POA: Diagnosis not present

## 2020-01-17 DIAGNOSIS — Z85828 Personal history of other malignant neoplasm of skin: Secondary | ICD-10-CM | POA: Insufficient documentation

## 2020-01-17 DIAGNOSIS — R0602 Shortness of breath: Secondary | ICD-10-CM | POA: Diagnosis not present

## 2020-01-17 DIAGNOSIS — R0789 Other chest pain: Secondary | ICD-10-CM | POA: Diagnosis not present

## 2020-01-17 DIAGNOSIS — I2510 Atherosclerotic heart disease of native coronary artery without angina pectoris: Secondary | ICD-10-CM | POA: Insufficient documentation

## 2020-01-17 DIAGNOSIS — Z7984 Long term (current) use of oral hypoglycemic drugs: Secondary | ICD-10-CM | POA: Diagnosis not present

## 2020-01-17 LAB — CBC
HCT: 36.5 % — ABNORMAL LOW (ref 39.0–52.0)
Hemoglobin: 11.7 g/dL — ABNORMAL LOW (ref 13.0–17.0)
MCH: 28.1 pg (ref 26.0–34.0)
MCHC: 32.1 g/dL (ref 30.0–36.0)
MCV: 87.7 fL (ref 80.0–100.0)
Platelets: 185 10*3/uL (ref 150–400)
RBC: 4.16 MIL/uL — ABNORMAL LOW (ref 4.22–5.81)
RDW: 13.7 % (ref 11.5–15.5)
WBC: 7.5 10*3/uL (ref 4.0–10.5)
nRBC: 0 % (ref 0.0–0.2)

## 2020-01-17 LAB — BASIC METABOLIC PANEL
Anion gap: 9 (ref 5–15)
BUN: 15 mg/dL (ref 8–23)
CO2: 27 mmol/L (ref 22–32)
Calcium: 9.3 mg/dL (ref 8.9–10.3)
Chloride: 99 mmol/L (ref 98–111)
Creatinine, Ser: 1.04 mg/dL (ref 0.61–1.24)
GFR calc Af Amer: >60 mL/min (ref >60)
GFR calc non Af Amer: >60 mL/min (ref >60)
Glucose, Bld: 205 mg/dL — ABNORMAL HIGH (ref 70–99)
Potassium: 3.8 mmol/L (ref 3.5–5.1)
Sodium: 135 mmol/L (ref 135–145)

## 2020-01-17 LAB — TROPONIN I (HIGH SENSITIVITY): Troponin I (High Sensitivity): 7 ng/L (ref <18)

## 2020-01-17 MED ORDER — SODIUM CHLORIDE 0.9% FLUSH: 3.0000 mL | Freq: Once | INTRAVENOUS | Status: DC

## 2020-01-17 NOTE — ED Triage Notes (Signed)
Patient arrives to ED with complaints of substernal intermittent chest pain x3 days. Patient states that starting today hes also became dizzy. Patient takes his BP at home which has been high over the past week. Patient took 324 ASA and is currently pain free.

## 2020-01-18 ENCOUNTER — Emergency Department (HOSPITAL_COMMUNITY): Payer: Medicare Other

## 2020-01-18 ENCOUNTER — Telehealth: Payer: Self-pay

## 2020-01-18 DIAGNOSIS — R42 Dizziness and giddiness: Secondary | ICD-10-CM | POA: Diagnosis not present

## 2020-01-18 DIAGNOSIS — I10 Essential (primary) hypertension: Secondary | ICD-10-CM | POA: Diagnosis not present

## 2020-01-18 LAB — TROPONIN I (HIGH SENSITIVITY): Troponin I (High Sensitivity): 10 ng/L (ref <18)

## 2020-01-18 MED ORDER — METFORMIN HCL 500 MG PO TABS
1000.0000 mg | ORAL_TABLET | Freq: Every day | ORAL | Status: DC
  Filled 2020-01-18: qty 2

## 2020-01-18 MED ORDER — GLYBURIDE 5 MG PO TABS
10.0000 mg | ORAL_TABLET | Freq: Every day | ORAL | Status: DC
  Filled 2020-01-18: qty 2

## 2020-01-18 MED ORDER — METOPROLOL SUCCINATE ER 25 MG PO TB24
50.0000 mg | ORAL_TABLET | Freq: Once | ORAL | Status: DC
  Filled 2020-01-18: qty 2

## 2020-01-18 NOTE — Discharge Instructions (Signed)
No evidence of stroke today on the MRI.  Labs did not show any sign of heart attack.  His blood pressure has remained high here so it will be important to call Dr. Josefine Class office as he may need to be on an additional blood pressure medication.  If he starts having severe chest pain, shortness of breath, change in behavior or other concerns you can return to the emergency room.

## 2020-01-18 NOTE — Telephone Encounter (Signed)
[place] Night - Client TELEPHONE ADVICE RECORD [facility] Patient Name: Paul Terrell Gender: Male DOB: Jan 19, 1931 Age: 86 Y 88 M 14 D Return Phone Number: [phone] (Primary), [phone] (Secondary) Address: [place] [address] Client [place] Night - Client Client Site [facility] Physician Renford Dills - MD Contact Type Call Who Is Calling Patient / Member / Family / Caregiver Call Type Triage / Clinical Caller Name Cameron Sprang Relationship To Patient Daughter Return Phone Number [phone] (Primary) Chief Complaint Blood Pressure High Reason for Call Symptomatic / Request for [place] says her father was experiencing dizziness, current bp is 182/90, oxygen 98, blood sugar 256 Translation No Nurse Assessment Nurse: Allean Found, RN, Estill Bamberg Date/Time (Eastern Time): 01/17/2020 5:07:14 PM Confirm and document reason for call. If symptomatic, describe symptoms. ---Caller states father is lightheaded and dizzy, B/P 190/104 currently. BS 256 - just now. Has the patient had close contact with a person known or suspected to have the novel coronavirus illness OR traveled / lives in area with major community spread (including international travel) in the last 14 days from the onset of symptoms? * If Asymptomatic, screen for exposure and travel within the last 14 days. ---No Does the patient have any new or worsening symptoms? ---Yes Will a triage be completed? ---Yes Related visit to physician within the last 2 weeks? ---No Does the PT have any chronic conditions? (i.e. diabetes, asthma, this includes High risk factors for pregnancy, etc.) ---Yes List chronic conditions. ---DM, HTN, Cardiac issues, High cholesterol Is this a behavioral health or substance abuse call? ---No Guidelines Guideline Title Affirmed Question Affirmed Notes Nurse Date/Time  (Eastern Time) Blood Pressure - High [id number] Systolic BP >= 0000000 OR Diastolic >= [id number] AND [id number] cardiac or neurologic symptoms (e.g., chest pain, difficulty breathing, unsteady gait, blurred vision) York, RN, Estill Bamberg 01/17/2020 5:09:42 PM PLEASE NOTE: All timestamps contained within this report are represented as [place] Standard Time. CONFIDENTIALTY NOTICE: This fax transmission is intended only for the addressee. It contains information that is legally privileged, confidential or otherwise protected from use or disclosure. If you are not the intended recipient, you are strictly prohibited from reviewing, disclosing, copying using or disseminating any of this information or taking any action in reliance on or regarding this information. If you have received this fax in error, please notify us immediately by telephone so that we can arrange for its return to [place]. Phone: [phone], Toll-Free: [phone], Fax: [phone] Page: 2 of 2 Call Id: [id number] [place]. Time Eilene Ghazi Time) Disposition Final User 01/17/2020 5:11:17 PM Go to ED Now Yes York, RN, Shelly Coss Disagree/Comply Comply Caller Understands Yes PreDisposition InappropriateToAsk Care Advice Given Per Guideline GO TO ED NOW: * You need to be seen in the Emergency Department. * Go to the ED at ___________ [place] now. Drive carefully. NOTE TO TRIAGER - DRIVING: * Another adult should drive. * If immediate transportation is not available via car or taxi, then the patient should be instructed to call EMS-911. CALL EMS 911 IF: * Patient passes out, starts acting confused or becomes too weak to stand. * You become worse. CARE ADVICE given per High Blood Pressure (Adult) guideline. Comments User: Darnelle Catalan, RN Date/Time Eilene Ghazi Time): 01/17/2020 5:12:04 PM At the fire station now and they may choose to transport him. Referrals [facility] - ED

## 2020-01-18 NOTE — Telephone Encounter (Signed)
Noted. Labs (cycled cardiac enzymes) and CXR so far unrevealing, pending MRI r/o stroke.

## 2020-01-18 NOTE — ED Provider Notes (Addendum)
[facility] EMERGENCY DEPARTMENT Provider Note   CSN: [id number] Arrival date & time: 01/17/20  1820     History Chief Complaint  Patient presents with  . Chest Pain    Paul Terrell is a 86 y.o. male.  Patient is an 86 year old male with a history of CAD, diabetes, hypertension, hyperlipidemia, AAA, severe course of Covid that caused extensive chronic lung problems as well as intense physical therapy due to poor gait who is presenting today with complaints of dizziness.  Patient's son is with him and also provides history.  Patient reports for the last 3 or 4 days he has been having dizziness.  He describes it as feeling off balance and seems to be most significant when he goes from sitting to standing or if he is looking up or looking down.  Patient does report he was picking up sticks in the yard 3 days ago and he was looking down to get a stick and lost his balance due to feeling dizzy and fell onto his left side.  He denies hitting his head at that time and did not seek medical care.  He denies feeling dizzy while sitting in the bed and denies feeling short of breath.  However he states he does have some shortness of breath with activity and occasional chest tightness.  He denies any chest tightness at this time.  Also over the last 3 or 4 days patient's blood pressure has also been significantly elevated compared to his baseline.  Patient's son states that both he and his sister were out of town this weekend and they are not sure if he was taking his blood pressure medication appropriately.  Also he recently had decreased his Metformin to once a day due to abdominal cramping and for the last week his blood sugar has been more elevated in the 200s.  Patient denies any abdominal pain, nausea, vomiting.  No new swelling in his legs.  He has been eating and drinking normally.  He denies headache or neck pain.  The history is provided by the patient and a relative.  Chest  Pain      Past Medical History:  Diagnosis Date  . Arthritis   . Basal cell carcinoma of scalp 12/16/07   Reexcision (Dr. Merril Abbe. Teressa Senter)  . BPH (benign prostatic hyperplasia)   . CAD (coronary artery disease) 11/01   stent placed  . Cataract   . Diabetes mellitus type II 11/01  . GERD (gastroesophageal reflux disease)    occasionally  . Hearing aid worn   . HOH (hard of hearing)   . Hyperlipemia 11/01  . Hypertension 11/01  . Ruptured disc, cervical    Neck C7 (Dr. Pearlie Oyster)    Patient Active Problem List   Diagnosis Date Noted  . Aortic aneurysm ([place]) 10/20/2019  . Acute respiratory failure with hypoxia ([place]) 08/25/2019  . Acute metabolic encephalopathy 93/57/0177  . GERD (gastroesophageal reflux disease)   . Thrombocytopenia ([place])   . Elevated liver enzymes   . Abnormal gait 11/22/2018  . Anemia 05/14/2018  . Hypertension 05/14/2018  . Unsteadiness on feet 11/05/2017  . Spinal stenosis of lumbar region 11/08/2015  . Advance care planning 11/04/2014  . Leg pain 01/30/2014  . Medicare annual wellness visit, subsequent 11/01/2013  . BPH (benign prostatic hyperplasia)   . Diabetes mellitus, type II ([place])   . Hyperlipidemia   . Hypertensive heart disease   . CAD (coronary artery disease)     Past Surgical  History:  Procedure Laterality Date  . BASAL CELL CARCINOMA EXCISION    . BRONCHIAL BRUSHINGS  05/16/2018   Procedure: ESOPHAGEAL BRUSHINGS;  Surgeon: Irving Copas., MD;  Location: [facility];  Service: Gastroenterology;;  . Lillard Anes  02/2000   Dr. Janus Molder - Right  . CERVICAL FUSION    . COLONOSCOPY     2012  . COLONOSCOPY N/A 05/17/2018   Procedure: COLONOSCOPY;  Surgeon: Mansouraty, Telford Nab., MD;  Location: [facility];  Service: Gastroenterology;  Laterality: N/A;  . ESOPHAGOGASTRODUODENOSCOPY (EGD) WITH PROPOFOL N/A 05/16/2018   Procedure: ESOPHAGOGASTRODUODENOSCOPY (EGD) WITH PROPOFOL ;  Surgeon: Rush Landmark Telford Nab., MD;   Location: [facility];  Service: Gastroenterology;  Laterality: N/A;  . HOT HEMOSTASIS N/A 05/16/2018   Procedure: HOT HEMOSTASIS (ARGON PLASMA COAGULATION/BICAP);  Surgeon: Irving Copas., MD;  Location: [facility];  Service: Gastroenterology;  Laterality: N/A;  . KNEE ARTHROSCOPY  09/1999   Right  . LACERATION REPAIR  09/1999   Right Hand (Dr. Fredna Dow)  . LITHOTRIPSY  1991   Dr. Elyse Jarvis  . POLYPECTOMY  05/17/2018   Procedure: POLYPECTOMY;  Surgeon: Mansouraty, Telford Nab., MD;  Location: [facility];  Service: Gastroenterology;;  . Cherre Robins CUFF REPAIR  02/05   Left       Family History  Problem Relation Age of Onset  . Stroke Mother        Lived 2 years  . Heart disease Mother        CAD, Angioplasty X 2  . Hypertension Mother   . Heart disease Father        MI  . Hypertension Father   . Hypertension Sister   . Hypertension Brother   . Heart disease Brother        MI  . Hyperlipidemia Brother   . Heart disease Brother        MI    Social History   Tobacco Use  . Smoking status: Former Smoker    Packs/day: 1.00    Years: 25.00    Pack years: 25.00    Types: Cigarettes    Quit date: 01/09/1976    Years since quitting: 44.0  . Smokeless tobacco: Former Systems developer    Types: Chew  . Tobacco comment: quit smoking about 25 years ago  Substance Use Topics  . Alcohol use: No  . Drug use: No    Home Medications Prior to Admission medications   Medication Sig Start Date End Date Taking? Authorizing Provider  acetaminophen (TYLENOL) 500 MG tablet Take 500 mg by mouth every 6 (six) hours as needed (for pain or headaches).   Yes [provider]  aspirin 81 MG tablet Take 81 mg by mouth at bedtime.    Yes [provider]  Cholecalciferol (VITAMIN D-3) 5000 UNITS TABS Take 5,000 Units by mouth daily.    Yes [provider]  ferrous sulfate 325 (65 FE) MG tablet Take 1 tablet (325 mg total) by mouth daily with breakfast. 11/19/18  Yes Tonia Ghent, MD  finasteride (PROSCAR) 5 MG tablet TAKE 1 TABLET (5 MG TOTAL) BY MOUTH DAILY. Patient taking differently: Take 5 mg by mouth daily.  11/15/19  Yes Tonia Ghent, MD  glyBURIDE (DIABETA) 5 MG tablet TAKE 2 TABLETS (10 MG TOTAL) DAILY WITH BREAKFAST. Patient taking differently: 10 mg daily with breakfast.  01/13/20  Yes Tonia Ghent, MD  Melatonin 5 MG TABS Take 5 mg by mouth at bedtime as needed.   Yes [provider]  metFORMIN (GLUCOPHAGE) 1000 MG tablet TAKE 1 TABLET TWICE DAILY Patient taking differently: 1,000 mg in the morning and at bedtime.  12/16/19  Yes Tonia Ghent, MD  metoprolol succinate (TOPROL-XL) 50 MG 24 hr tablet TAKE 1 TABLET EVERY DAY Patient taking differently: Take 50 mg by mouth daily.  01/13/20  Yes Tonia Ghent, MD  mirtazapine (REMERON) 7.5 MG tablet Take 7.5 mg by mouth at bedtime.  09/27/19  Yes [provider]  Multiple Vitamin (MULTIVITAMIN) tablet Take 1 tablet by mouth daily.     Yes [provider]  pantoprazole (PROTONIX) 40 MG tablet Take 1 tablet (40 mg total) by mouth daily. 12/21/19  Yes Tonia Ghent, MD  pravastatin (PRAVACHOL) 20 MG tablet TAKE 1 TABLET (20 MG TOTAL) BY MOUTH AT BEDTIME. 10/06/19  Yes Tonia Ghent, MD  tamsulosin (FLOMAX) 0.4 MG CAPS capsule TAKE 1 CAPSULE (0.4 MG TOTAL) BY MOUTH AT BEDTIME. 10/06/19  Yes Tonia Ghent, MD  vitamin B-12 (CYANOCOBALAMIN) 250 MCG tablet Take 250 mcg by mouth 2 (two) times daily.    Yes [provider]  blood glucose meter kit and supplies Dispense Accu-chek meter. Check sugar once daily. DX E11.9 10/07/19   Tonia Ghent, MD  glucose blood (ONE TOUCH ULTRA TEST) test strip TEST BLOOD SUGAR ONCE DAILY. DX: 250.00 10/05/19   Tonia Ghent, MD  Lancets [facility] ULTRASOFT) lancets Test once daily Dx 250.00     [provider]    Allergies    Nsaids, Citrus, and Zocor [simvastatin]  Review of Systems   Review of Systems   Cardiovascular: Positive for chest pain.  All other systems reviewed and are negative.   Physical Exam Updated Vital Signs BP (!) 197/73 (BP Location: Left Arm)   Pulse (!) 55   Temp 98.4 F (36.9 C) (Oral)   Resp 17   SpO2 97%   Physical Exam Vitals and nursing note reviewed.  Constitutional:      General: He is not in acute distress.    Appearance: Normal appearance. He is well-developed and normal weight.  HENT:     Head: Normocephalic and atraumatic.     Right Ear: Tympanic membrane normal.     Left Ear: Tympanic membrane normal.     Nose: Nose normal.     Mouth/Throat:     Mouth: Mucous membranes are moist.  Eyes:     General: No visual field deficit.    Conjunctiva/sclera: Conjunctivae normal.     Pupils: Pupils are equal, round, and reactive to light.  Cardiovascular:     Rate and Rhythm: Normal rate and regular rhythm.     Pulses: Normal pulses.     Heart sounds: No murmur.  Pulmonary:     Effort: Pulmonary effort is normal. No respiratory distress.     Breath sounds: Normal breath sounds. No wheezing or rales.  Abdominal:     General: There is no distension.     Palpations: Abdomen is soft.     Tenderness: There is no abdominal tenderness. There is no guarding or rebound.  Musculoskeletal:        General: No tenderness. Normal range of motion.     Cervical back: Normal range of motion and neck supple.     Left lower leg: No edema.  Skin:    General: Skin is warm and dry.     Findings: No erythema or rash.  Neurological:     Mental Status: He is alert and  oriented to person, place, and time.     Cranial Nerves: No cranial nerve deficit, dysarthria or facial asymmetry.     Sensory: Sensation is intact.     Motor: No weakness or pronator drift.     Coordination: Coordination is intact. Finger-Nose-Finger Test and Heel to [facility] Test normal.     Comments: No noted nystagmus  Psychiatric:        Mood and Affect: Mood normal.        Behavior: Behavior  normal.        Thought Content: Thought content normal.     ED Results / Procedures / Treatments   Labs (all labs ordered are listed, but only abnormal results are displayed) Labs Reviewed  BASIC METABOLIC PANEL - Abnormal; Notable for the following components:      Result Value   Glucose, Bld 205 (*)    All other components within normal limits  CBC - Abnormal; Notable for the following components:   RBC 4.16 (*)    Hemoglobin 11.7 (*)    HCT 36.5 (*)    All other components within normal limits  TROPONIN I (HIGH SENSITIVITY)  TROPONIN I (HIGH SENSITIVITY)    EKG EKG Interpretation  Date/Time:  Monday January 17 2020 18:22:29 EDT Ventricular Rate:  59 PR Interval:  266 QRS Duration: 96 QT Interval:  402 QTC Calculation: 397 R Axis:   -3 Text Interpretation: Sinus bradycardia with 1st degree A-V block new ST & T wave abnormality, consider lateral ischemia Confirmed by Blanchie Dessert [phone]) on 01/18/2020 8:08:34 AM   Radiology DG Chest 2 View  Result Date: 01/17/2020 CLINICAL DATA:  Chest pain and shortness of breath EXAM: CHEST - 2 VIEW COMPARISON:  10/07/2019 FINDINGS: Cardiac shadow is enlarged. Tortuosity of the thoracic aorta is noted. Lungs are well aerated bilaterally. Mild bibasilar atelectatic changes are seen. Previously seen ground-glass density in the left base has resolved in the interval. No bony abnormality is seen. Mild degenerative changes of the thoracic spine are seen. IMPRESSION: Bibasilar atelectasis. Resolution of previously seen ground-glass density in the left base. Electronically Signed   By: Inez Catalina M.D.   On: 01/17/2020 19:22   MR BRAIN WO CONTRAST  Result Date: 01/18/2020 CLINICAL DATA:  Vertigo EXAM: MRI HEAD WITHOUT CONTRAST TECHNIQUE: Multiplanar, multiecho pulse sequences of the brain and surrounding structures were obtained without intravenous contrast. COMPARISON:  None. FINDINGS: Motion artifact is present. Brain: There is no acute  infarction or intracranial hemorrhage. There is no intracranial mass, mass effect, or edema. There is no hydrocephalus or extra-axial fluid collection. Patchy and confluent areas of T2 hyperintensity in the supratentorial and pontine white matter nonspecific but probably reflect moderate chronic microvascular ischemic changes. Prominence of the ventricles and sulci reflects generalized parenchymal volume loss. Appearance is similar to the prior study. Vascular: Major vessel flow voids at the skull base are preserved. Skull and upper cervical spine: Normal marrow signal is preserved. Sinuses/Orbits: Trace mucosal thickening.  Orbits are unremarkable. Other: Sella is unremarkable.  Trace mastoid fluid opacification. IMPRESSION: No acute infarction, hemorrhage, or mass. Stable chronic findings detailed above. Electronically Signed   By: Macy Mis M.D.   On: 01/18/2020 11:11    Procedures Procedures (including critical care time)  Medications Ordered in ED Medications  sodium chloride flush (NS) 0.9 % injection 3 mL (has no administration in time range)  glyBURIDE (DIABETA) tablet 10 mg (has no administration in time range)  metoprolol succinate (TOPROL-XL) 24 hr tablet 50 mg (  50 mg Oral Not Given 01/18/20 0914)  metFORMIN (GLUCOPHAGE) tablet 1,000 mg (has no administration in time range)    ED Course  I have reviewed the triage vital signs and the nursing notes.  Pertinent labs & imaging results that were available during my care of the patient were reviewed by me and considered in my medical decision making (see chart for details).    MDM Rules/Calculators/A&P                      Elderly male presenting today with vague symptoms of dizziness, hypertension and occasional chest discomfort.  Patient had a severe course of Covid at the beginning of December and son reports that he had to relearn to walk again and has chronic scar tissue in his lungs.  In the last 4 days patient has experienced  symptoms of dizziness which he describes are present when he goes from sitting to standing or when he is looking up or looking down.  This even caused a fall 1 time 3 days ago but he denies significant injury.  Patient denies any chest pain or shortness of breath at this time but states when he exerts himself he will feel short of breath and have occasional chest tightness.  Patient's EKG today showed some more prominent ST depression anteriorly but otherwise no other findings.  Chest x-ray shows resolved Covid, troponin was 8 and 10 BMP with hyperglycemia of 200 but otherwise normal and CBC is unchanged.  On exam patient has no localizing findings but is noted to have a blood pressure of 197/73 and it has been in the 200s as well.  Given patient's vague description of his dizziness concern for possible stroke however dizziness could also be a result of his elevated blood pressure.  There is some concerned that maybe he was not taking his blood pressure medication appropriately because his son and daughter were out of town over the weekend.  Given negative delta troponin symptoms ongoing for multiple days and vague story lower suspicion that this is ACS, PE or dissection.  Patient is otherwise very well-appearing.  Will do MRI to rule out stroke. 11:54 AM Patient's MRI is negative for acute stroke.  Despite patient's home medication of metoprolol here he remains hypertensive.  Discussed findings with his son and encouraged them to call his PCPs office today for further treatment of his high blood pressure.  Suspect this is the cause of why he is having intermittent dizziness.  Patient at this time is well-appearing and has no complaints.  Feel that he is stable for discharge but was given return precautions.  MDM Number of Diagnoses or Management Options Dizziness: new, needed workup Hypertension, unspecified type: established, worsening   Amount and/or Complexity of Data Reviewed Clinical lab tests:  ordered and reviewed Tests in the radiology section of CPT: ordered and reviewed Tests in the medicine section of CPT: ordered and reviewed Decide to obtain previous medical records or to obtain history from someone other than the patient: yes Obtain history from someone other than the patient: yes Review and summarize past medical records: yes Discuss the patient with other providers: no Independent visualization of images, tracings, or specimens: yes  Risk of Complications, Morbidity, and/or Mortality Presenting problems: moderate Diagnostic procedures: low Management options: low  Patient Progress Patient progress: stable    Final Clinical Impression(s) / ED Diagnoses Final diagnoses:  Hypertension, unspecified type  Dizziness    Rx / DC Orders ED Discharge  Orders    None       Blanchie Dessert, MD 01/18/20 1155    Blanchie Dessert, MD 01/18/20 1156

## 2020-01-18 NOTE — Telephone Encounter (Signed)
Per chart review tab pt went to [facility] ED on 01/17/20; per switchboard at [facility] pt is still in [facility].

## 2020-01-19 NOTE — Telephone Encounter (Signed)
MRI reassuring. Discharged home.  plz call pt or son for update - how are blood pressures? I'd like them to keep log over next few days and call us Friday with readings - may need to add second BP med if staying elevated. He should be currently on metoprolol XL 50mg  daily.

## 2020-01-19 NOTE — Telephone Encounter (Signed)
Spoke with pt's son, [name] (on dpr), asking how pt's BP is doing.  States readings are coming down.  Confirms pt is taking metoprolol XL daily.  I relayed Dr. Synthia Innocent message.  Verbalizes understanding and will call back on 01/21/20.  FYI to Dr. Darnell Level.

## 2020-01-19 NOTE — Telephone Encounter (Signed)
Spoke with Amy (former [facility] nurse), she has been keeping tabs on him since he was discharged from her care in Feb 2021 -- she has been helping the wife fill the medication box and helping to check his BP.   She wanted to make our office aware that he was seen in the ED - I advised that we are aware and that Dr Danise Mina is actually following his BP this week.   Current BP check today is 170/70 HR 64 regular BS running 190-320s range  Pt feels okay but is having some dizziness today.   Currently taking Metoprolol 50mg  XL daily.  Wanting to know if maybe need to add Lisinopril 2.5mg  back into regimen -- this was d/c'd 10/07/19 d/t increased dizziness.   Amy states that she Will call back on Friday with more BP readings -- she going to drop back in to check his BP again and help fill his pill box.   She did say that if there is another medication added that he will once again qualify for [facility] and a referral can be placed for [facility] services to be started back up.   She states that we can give her a call back with any recommendations Amy [phone] - she said right now she is just considered a Caregiver

## 2020-01-19 NOTE — Telephone Encounter (Signed)
Noted.  May very well need to add lisinopril back on. Will await BP readings on Friday.

## 2020-01-19 NOTE — Telephone Encounter (Signed)
Cameron Sprang, Patient's daughter is returning your call .

## 2020-01-19 NOTE — Telephone Encounter (Addendum)
Lvm asking pt/son, [name] to call back.  Need to relay Dr. Synthia Innocent message.

## 2020-01-21 NOTE — Addendum Note (Signed)
Addended by: Ria Bush on: 01/21/2020 05:13 PM   Modules accepted: Orders

## 2020-01-21 NOTE — Telephone Encounter (Signed)
Amy called back with BP readings. Patient is feeling pretty good today.   4/29  BP 180/70 HR 66  4/30 BP 180/72 HR 55 BS 314   Pt had been having Diarrhea -- Dr [name] stopped his Magnesium and Iron,  Metformin dose cut in 1/2 to see if helped with Diarrhea. Amy states that he went a while without diarrhea but has started back now even with medication doses. Amy states that she has been doing his pill box since January and he has not been on Magnesium so this must have been an old Rx that was never taken off the med list and Dr [name] was not aware. Pt did stop Iron when advised 3/30 and also cut Metformin dose.   Amy feels that he needs an appt for medication management asap. Amy has constructed up to date list of his medications he currently taking that he can bring with him. Patient is already scheduled for an appt 01/28/20 at 8:15am Requesting a referral to [facility] with [facility] as previously mentioned in 4/28 note below.  Will send to Dr Darnell Level with update of BP readings

## 2020-01-21 NOTE — Telephone Encounter (Signed)
Spoke with Amy. Gave verbal order to restart lisinopril 2.5mg  daily. Accurate automatic cuff at home. HH has had him for 4 months. PT just discharged him. He will come see PCP next Friday - to consider restarting HH at that time.   Currently taking metformin 500mg  in am. Not taking magnesium or iron at this time.

## 2020-01-23 NOTE — Telephone Encounter (Signed)
If he still has diarrhea in spite of taking 500mg  metformin in the meantime, then okay to hold until the upcoming OV.  Thanks.

## 2020-01-25 ENCOUNTER — Telehealth: Payer: Self-pay

## 2020-01-25 NOTE — Telephone Encounter (Signed)
[place] Night - Client Nonclinical Telephone Record [facility] Client [place] Night - Client Client Site [facility] Physician Renford Dills - MD Contact Type Call Who Is Calling Patient / Member / Family / Caregiver Caller Name Denym Middaugh Caller Phone Number [phone] Call Type Message Only Information Provided Reason for Call Returning a Call from the Office Initial Comment caller states someone called about his father Kilian Hewitson Additional Comment office hours provided Disp. Time Disposition Final User 01/24/2020 5:43:31 PM General Information Provided Yes Birdena Jubilee Call Closed By: Birdena Jubilee Transaction Date/Time: 01/24/2020 5:41:19 PM (ET

## 2020-01-25 NOTE — Telephone Encounter (Signed)
I am unsure who called. It may have been for the upcoming OV 01-28-20. Several people have been helping with messages but I do not see anything stating someone called.

## 2020-01-26 DIAGNOSIS — H04123 Dry eye syndrome of bilateral lacrimal glands: Secondary | ICD-10-CM | POA: Diagnosis not present

## 2020-01-26 NOTE — Telephone Encounter (Signed)
[name], patient's son, advised

## 2020-01-28 ENCOUNTER — Ambulatory Visit: Payer: Medicare Other | Admitting: Family Medicine

## 2020-02-08 ENCOUNTER — Ambulatory Visit: Payer: Medicare Other | Admitting: Family Medicine

## 2020-02-21 ENCOUNTER — Other Ambulatory Visit: Payer: Self-pay | Admitting: Family Medicine

## 2020-02-25 ENCOUNTER — Encounter: Payer: Self-pay | Admitting: Family Medicine

## 2020-02-25 ENCOUNTER — Other Ambulatory Visit: Payer: Self-pay

## 2020-02-25 ENCOUNTER — Ambulatory Visit (INDEPENDENT_AMBULATORY_CARE_PROVIDER_SITE_OTHER): Payer: Medicare Other | Admitting: Family Medicine

## 2020-02-25 ENCOUNTER — Telehealth: Payer: Self-pay

## 2020-02-25 DIAGNOSIS — E119 Type 2 diabetes mellitus without complications: Secondary | ICD-10-CM

## 2020-02-25 DIAGNOSIS — I10 Essential (primary) hypertension: Secondary | ICD-10-CM | POA: Diagnosis not present

## 2020-02-25 DIAGNOSIS — I25118 Atherosclerotic heart disease of native coronary artery with other forms of angina pectoris: Secondary | ICD-10-CM

## 2020-02-25 MED ORDER — LISINOPRIL 2.5 MG PO TABS: 5.0000 mg | ORAL_TABLET | Freq: Every day | ORAL | 1 refills | Status: DC

## 2020-02-25 MED ORDER — SITAGLIPTIN PHOSPHATE 50 MG PO TABS: 25.0000 mg | ORAL_TABLET | Freq: Every day | ORAL | 1 refills | Status: DC

## 2020-02-25 NOTE — Telephone Encounter (Signed)
Amy advised that we will work on a PA or see if there is an acceptable alternative but this will take a few days.

## 2020-02-25 NOTE — Progress Notes (Signed)
This visit occurred during the SARS-CoV-2 public health emergency.  Safety protocols were in place, including screening questions prior to the visit, additional usage of staff PPE, and extensive cleaning of exam room while observing appropriate contact time as indicated for disinfecting solutions.  ER f/u re: HTN.    "I feel pretty good."  He has to get up at night to urinate but recently fell out of the bed and his back is sore now.  This was 12 days ago, but getting better in the meantime.  This was after the ER eval.  Lower midline back pain, "near the tailbone."  No radiation into the legs.  Tylenol helps.    Elevated SBP today but better than at ER.  Dizzy sensation resolved.  MRI neg.  No acute infarction, hemorrhage, or mass. Stable chronic findings, discussed with patient and son.  BP usually ~160/90 at home.  No exertional CP.    Off metformin.  GI sx better off metformin.  Sugar 200-300, higher off metformin. No low sugars.  Weight stable.    Meds, vitals, and allergies reviewed.   ROS: Per HPI unless specifically indicated in ROS section   GEN: nad, alert and oriented, HOH HEENT: ncat NECK: supple w/o LA CV: rrr.  PULM: ctab, no inc wob ABD: soft, +bs EXT: no edema SKIN: well perfused.  Lower midline back ttp

## 2020-02-25 NOTE — Telephone Encounter (Signed)
Amy who is a nurse with [employer] is helping pts wife fix pts med boxes as a friendly gesture. Pt is not on HH at this time. Pt stopping metformin as instructed but cost of Januvia monthly is $492.00 and pt cannot afford that med. Amy request cb with what med to substitute or PA or what to do. Pt is continuing to take Glyburide 5 mg taking 2 tabs po at breakfast. Please advise. [facility].

## 2020-02-25 NOTE — Patient Instructions (Addendum)
Stay off the metformin for now.    Increase lisinopril to 5mg  a day (2 of the 2.5mg  tabs at the same time).  After 1 week, if BP is still >150/>90, then increase to 7.5mg  (3 tabs).   Add on [place] for your sugar.  Start with 1/2 tab a day.  If AM sugar still >150 after 1 week, then increase to 1 tab a day.   Update me as needed.   Plan on recheck in about 3 months.  Labs at the visit or ahead of time if possible.  Take care.  Glad to see you.

## 2020-02-25 NOTE — Telephone Encounter (Signed)
Can you start the PA to either get this approved or find out what alternatives are available?  Thanks.

## 2020-02-27 NOTE — Assessment & Plan Note (Signed)
Discussed options Increase lisinopril to 5mg  a day (2 of the 2.5mg  tabs at the same time).  After 1 week, if BP is still >150/>90, then increase to 7.5mg  (3 tabs).  Update me as needed.

## 2020-02-27 NOTE — Assessment & Plan Note (Signed)
Discussed options. Stay off Metformin for now. Add on januvia.  Start with 1/2 tab a day.  If AM sugar still >150 after 1 week, then increase to 1 tab a day.   Update me as needed.   Plan on recheck in about 3 months.  Labs at the visit or ahead of time if possible.

## 2020-02-29 NOTE — Telephone Encounter (Signed)
Attempted to start PA but patient must have a Rx card that is different than the [facility] card that we have on file here in the office.  Son was contacted to see if he could get that information to [place].

## 2020-03-02 NOTE — Telephone Encounter (Signed)
Son says his cards were lost at his last hospital stay and he will need to call [facility] and get the card replaced.  Son will get a copy of the card to [place] when it arrives.

## 2020-03-03 ENCOUNTER — Telehealth: Payer: Self-pay

## 2020-03-03 NOTE — Telephone Encounter (Signed)
Amy advised and left message on voicemail of son [name]).  Amy now says his blood sugar is 320 and he hasn't had anything to eat since lunch time.  Amy questions if he could maybe go back on the Metformin until something can be worked out with the insurance for [employer]?

## 2020-03-03 NOTE — Telephone Encounter (Signed)
Amy nurse with [facility] left v/m that pt BP was 170/70 pt takes lisinopril 5mg  in PM. Amy wants to know what to do. I tried calling Amy for more info about how pt was feeling and if rechecked BP. No answer.

## 2020-03-03 NOTE — Telephone Encounter (Signed)
We talked about this at the last OV.  He was going to increase lisinopril to 5mg  a day (2 of the 2.5mg  tabs at the same time).  At this point since SBP is still >150, I would increase to 7.5mg  (3 tabs).  Have him update me about his BP in about 1 week, sooner if needed.  Thanks.

## 2020-03-05 NOTE — Telephone Encounter (Signed)
He can retry 1 Metformin a day to see if he can tolerate that without GI upset.  If he has return of GI upset then I would stop the medication in the meantime.  What is the expected timeframe on approval for Januvia?

## 2020-03-06 NOTE — Telephone Encounter (Signed)
Spoke with son, [name], who says that the patient had been checking his sugars with 2 different meters that they had at the house for a while and was getting really high readings.  Son says he bought a new meter and the readings that they have gotten over the past few days have been much more reasonable.  Son asks if perhaps it would make sense to get some more readings this week and then determine if insulin is needed.  Son is certainly open to doing insulin but doesn't want the patient to get low readings and pass out.

## 2020-03-06 NOTE — Telephone Encounter (Signed)
Spoke with Amy, [facility] nurse, who says the family tried 1/2 tab of Metformin over the weekend and he started back immediately with GI issues.  I called [employer] who says the medication does not require a PA but perhaps a tier exception.  Upon completing the tier exeption over the phone, I was advised that since there isn't a brand name drug for this condition in a lower cost-sharing tier, exception is not permitted. (PPW in In Box).  I printed out an appeals form and placed in the In Box as well.  Amy with HH was advised.  Amy asks if perhaps they could look at Insulin as an alternative.  Pharnacist says that other meds such as Onglyza, etc will be in the same tier status as Januvia.

## 2020-03-06 NOTE — Telephone Encounter (Signed)
Given all of this, the best option may be to start a once daily insulin (assuming patient is willing to try that).  I doubt we'll be able to get [place] approved.    If we start him on insulin, we may be able to eventually stop glyburide.  If he is willing to start once daily insulin, then please let me know so I can send the rx.  We can set him up for insulin teaching with me.    Thanks.

## 2020-03-07 NOTE — Telephone Encounter (Signed)
Son advised and will report readings later this week.

## 2020-03-07 NOTE — Telephone Encounter (Signed)
If the readings are a lot lower on the new meter then I would get a few more readings and then have him update [place].  Thanks.

## 2020-03-07 NOTE — Telephone Encounter (Signed)
Phoned [employer] for PA which was not needed for this medication.  Applied for a tier exception which was denied.  Appeals ppw was printed and partially completed (except for MD part) but [facility] nurse suggested that perhaps insulin would be a good alternative.  Patient's son already takes insulin and would be patient's helper if this is decided.  In speaking with the son, he states that his Dad's glucometer is old and he purchased a new meter and seems to be getting more reasonable readings.  Dr. [name] agreed that we would get a collection of readings from the new meter prior to starting the insulin regimen.  Son will submit these readings later in the week.

## 2020-03-28 ENCOUNTER — Telehealth: Payer: Self-pay

## 2020-03-28 NOTE — Telephone Encounter (Signed)
Patient's contacted the office and states that he wanted to speak with Lugene regarding patient's blood sugar. I advised I was a CMA, and I could help him - and asked if there was anything I could help with, but he stated he wanted to speak with Lugene, as she knows what needs to be relayed back to Dr. [name].Marland Kitchen

## 2020-03-28 NOTE — Telephone Encounter (Signed)
Agreed.  Is patient willing to start insulin?  And did he increase the [place] to 50mg  a day?  Please let me know.  Thanks.

## 2020-03-28 NOTE — Telephone Encounter (Signed)
I think all of this was documented previously. Patient was unable to afford the Januvia.  I tried to do a PA but it was still denied.  Patient is willing to do insulin and the son can help him with it so no teaching session is needed.

## 2020-03-28 NOTE — Telephone Encounter (Signed)
Paul Terrell (son) called to say that patient's FBS seems to continually be going up.  It is occasionally ~ 190 but mostly is in the 200's, sometimes nearer to 300.  Son wonders if perhaps we should look at  Insulin as an alternative to the expensive medication.  Also, Paul Terrell says his Dad's BP seems to be getting some better, ~ 160/72.

## 2020-03-30 MED ORDER — PEN NEEDLES 31G X 5 MM MISC: 3 refills | Status: DC

## 2020-03-30 MED ORDER — LANTUS SOLOSTAR 100 UNIT/ML ~~LOC~~ SOPN: 5.0000 [IU] | PEN_INJECTOR | Freq: Every day | SUBCUTANEOUS | 99 refills | Status: DC

## 2020-03-30 NOTE — Telephone Encounter (Signed)
Would start with Lantus 5 units a day.  Would give the injection after suppertime.  After a few days if his blood sugar is still above 150 in the mornings, then I would add 1 unit/day.  I would keep adding 1 unit/day until his a.m. sugar is between 100 and 150.  When his sugar is between 100 and 150, then I would keep giving that dose of insulin.  I would like him to update me about 1 week after he starts insulin, sooner if needed.  Prescription sent.  Thanks.

## 2020-03-30 NOTE — Addendum Note (Signed)
Addended by: Tonia Ghent on: 03/30/2020 11:25 PM   Modules accepted: Orders

## 2020-03-31 NOTE — Telephone Encounter (Signed)
Pt son [name]) contacted and informed of PCP note below.   [name] stated understanding of the dosing instructions and agrees to call us with an update after using the Lantus for a week.   I will keep an eye of any communication regarding need for Prior Auth and start if I see one come through today.

## 2020-04-14 ENCOUNTER — Other Ambulatory Visit (INDEPENDENT_AMBULATORY_CARE_PROVIDER_SITE_OTHER): Payer: Medicare Other

## 2020-04-14 ENCOUNTER — Other Ambulatory Visit: Payer: Self-pay

## 2020-04-14 DIAGNOSIS — D649 Anemia, unspecified: Secondary | ICD-10-CM

## 2020-04-14 DIAGNOSIS — E119 Type 2 diabetes mellitus without complications: Secondary | ICD-10-CM

## 2020-04-14 LAB — CBC WITH DIFFERENTIAL/PLATELET
Basophils Absolute: 0.1 10*3/uL (ref 0.0–0.1)
Basophils Relative: 1 % (ref 0.0–3.0)
Eosinophils Absolute: 0.2 10*3/uL (ref 0.0–0.7)
Eosinophils Relative: 2.8 % (ref 0.0–5.0)
HCT: 39.6 % (ref 39.0–52.0)
Hemoglobin: 13.6 g/dL (ref 13.0–17.0)
Lymphocytes Relative: 36.4 % (ref 12.0–46.0)
Lymphs Abs: 2.6 10*3/uL (ref 0.7–4.0)
MCHC: 34.3 g/dL (ref 30.0–36.0)
MCV: 87.7 fl (ref 78.0–100.0)
Monocytes Absolute: 0.5 10*3/uL (ref 0.1–1.0)
Monocytes Relative: 7.4 % (ref 3.0–12.0)
Neutro Abs: 3.7 10*3/uL (ref 1.4–7.7)
Neutrophils Relative %: 52.4 % (ref 43.0–77.0)
Platelets: 160 10*3/uL (ref 150.0–400.0)
RBC: 4.51 Mil/uL (ref 4.22–5.81)
RDW: 18.2 % — ABNORMAL HIGH (ref 11.5–15.5)
WBC: 7 10*3/uL (ref 4.0–10.5)

## 2020-04-14 LAB — BASIC METABOLIC PANEL
BUN: 13 mg/dL (ref 6–23)
CO2: 28 mEq/L (ref 19–32)
Calcium: 9.4 mg/dL (ref 8.4–10.5)
Chloride: 99 mEq/L (ref 96–112)
Creatinine, Ser: 1.15 mg/dL (ref 0.40–1.50)
GFR: 60.15 mL/min (ref >60.00)
Glucose, Bld: 329 mg/dL — ABNORMAL HIGH (ref 70–99)
Potassium: 4.3 mEq/L (ref 3.5–5.1)
Sodium: 133 mEq/L — ABNORMAL LOW (ref 135–145)

## 2020-04-14 LAB — IRON: Iron: 149 ug/dL (ref 42–165)

## 2020-04-14 LAB — HEMOGLOBIN A1C: Hgb A1c MFr Bld: 11 % — ABNORMAL HIGH (ref 4.6–6.5)

## 2020-04-21 ENCOUNTER — Encounter: Payer: Self-pay | Admitting: Family Medicine

## 2020-04-21 ENCOUNTER — Ambulatory Visit (INDEPENDENT_AMBULATORY_CARE_PROVIDER_SITE_OTHER): Payer: Medicare Other | Admitting: Family Medicine

## 2020-04-21 ENCOUNTER — Other Ambulatory Visit: Payer: Self-pay

## 2020-04-21 VITALS — BP 182/66 | HR 70 | Temp 96.5°F | Ht 64.0 in | Wt 164.4 lb

## 2020-04-21 DIAGNOSIS — R079 Chest pain, unspecified: Secondary | ICD-10-CM | POA: Diagnosis not present

## 2020-04-21 DIAGNOSIS — M545 Low back pain, unspecified: Secondary | ICD-10-CM

## 2020-04-21 DIAGNOSIS — I10 Essential (primary) hypertension: Secondary | ICD-10-CM | POA: Diagnosis not present

## 2020-04-21 DIAGNOSIS — D649 Anemia, unspecified: Secondary | ICD-10-CM

## 2020-04-21 DIAGNOSIS — E119 Type 2 diabetes mellitus without complications: Secondary | ICD-10-CM | POA: Diagnosis not present

## 2020-04-21 DIAGNOSIS — I25118 Atherosclerotic heart disease of native coronary artery with other forms of angina pectoris: Secondary | ICD-10-CM

## 2020-04-21 MED ORDER — LISINOPRIL 2.5 MG PO TABS
10.0000 mg | ORAL_TABLET | Freq: Every day | ORAL | Status: DC
Start: 2020-04-21 — End: 2020-08-14

## 2020-04-21 MED ORDER — GLUCOSE BLOOD VI STRP: ORAL_STRIP | 12 refills | Status: DC

## 2020-04-21 NOTE — Progress Notes (Signed)
This visit occurred during the SARS-CoV-2 public health emergency.  Safety protocols were in place, including screening questions prior to the visit, additional usage of staff PPE, and extensive cleaning of exam room while observing appropriate contact time as indicated for disinfecting solutions.  Diabetes:  Using medications without difficulties: yes Hypoglycemic episodes: no Hyperglycemic episodes: no sx  Feet problems: some swelling.   Blood Sugars averaging: see below.   Insulin 5 units once a day.  Still on glyburide.  Sugar 200s usually.   A1c d/w pt.    Needs verio test strips.  Rx sent.    History of anemia noted.  Iron and HGB are improved.  Labs discussed with patient.  BP elevation noted at OV.   ~160-180s/70s.  He has exertional chest pain episodically.  Going on for the last month.  He attributed some of this to anxiety related to [place] CAD.  He has h/o CAD noted.  Has been taking lisinopril for his blood pressure, 7.5 mg a day.  He had fallen recently, cautions d/w pt.  L lower back ttp.  Was coming the basement door, tripped over a tool box.  Starting having pain two days later.  Tylenol helps some.  No LOC.    Meds, vitals, and allergies reviewed.  ROS: Per HPI unless specifically indicated in ROS section   GEN: nad, alert and oriented HEENT: ncat NECK: supple w/o LA CV: rrr. PULM: ctab, no inc wob ABD: soft, +bs EXT: no edema SKIN: no acute rash Left lower back minimally tender to palpation but not tender to palpation in the midline.  Able to bear weight.  Diabetic foot exam: Normal inspection No skin breakdown No calluses  1+ DP pulses Dec sensation to light touch and monofilament Nails normal

## 2020-04-21 NOTE — Patient Instructions (Addendum)
If your blood sugar is still above 150 in the mornings, then I would add 1 unit/day.  I would keep adding 1 unit/day until his a.m. sugar is between 100 and 150.   Stop iron for now.   Plan on recheck in 3 months with labs ahead of time.   We'll update cardiology in the meantime.  If any more chest pain then go to the ER.    Tylenol for back pain as needed.   Increase lisinopril to 10mg  a day.  Update me as needed.    Take care.  Glad to see you.

## 2020-04-23 DIAGNOSIS — M545 Low back pain, unspecified: Secondary | ICD-10-CM | POA: Insufficient documentation

## 2020-04-23 NOTE — Assessment & Plan Note (Signed)
Discussed gradually increasing insulin dose, if morning sugars above 150.  See after visit summary.  Recheck periodically.  He agrees.

## 2020-04-23 NOTE — Assessment & Plan Note (Signed)
Okay to hold iron for now.  Recent labs discussed with patient.  We can recheck later on.  He agrees.

## 2020-04-23 NOTE — Assessment & Plan Note (Signed)
Can use Tylenol as needed.  No need for imaging at this point.  He is not having midline back pain.  Cautions discussed with patient.

## 2020-04-23 NOTE — Assessment & Plan Note (Signed)
I want patient to increase lisinopril to 10 mg a day, family aware.  They can update me about his blood pressure in a few days.

## 2020-04-23 NOTE — Assessment & Plan Note (Addendum)
With history of exertional chest pain in the last month.  Discussed limiting exertion for now, ER cautions given.  To emergency room if recurrent chest pain.  No chest pain now.  EKG without acute changes, discussed with patient at office visit.  Continue aspirin and statin and lisinopril for now.  Refer to cardiology.

## 2020-04-24 ENCOUNTER — Telehealth: Payer: Self-pay | Admitting: Cardiology

## 2020-04-24 NOTE — Telephone Encounter (Signed)
No objections

## 2020-04-24 NOTE — Telephone Encounter (Signed)
New Message:     Pt would like to switch from Dr Bettina Gavia to Dr Loletha Grayer, because [facility] is closer for pt.

## 2020-04-24 NOTE — Telephone Encounter (Signed)
Okay with me 

## 2020-04-27 ENCOUNTER — Encounter: Payer: Self-pay | Admitting: Cardiovascular Disease

## 2020-04-27 ENCOUNTER — Other Ambulatory Visit: Payer: Self-pay

## 2020-04-27 ENCOUNTER — Ambulatory Visit (INDEPENDENT_AMBULATORY_CARE_PROVIDER_SITE_OTHER): Payer: Medicare Other | Admitting: Cardiovascular Disease

## 2020-04-27 VITALS — BP 172/63 | HR 54 | Ht 68.0 in | Wt 168.4 lb

## 2020-04-27 DIAGNOSIS — E119 Type 2 diabetes mellitus without complications: Secondary | ICD-10-CM

## 2020-04-27 DIAGNOSIS — I712 Thoracic aortic aneurysm, without rupture, unspecified: Secondary | ICD-10-CM

## 2020-04-27 DIAGNOSIS — I10 Essential (primary) hypertension: Secondary | ICD-10-CM

## 2020-04-27 DIAGNOSIS — E7800 Pure hypercholesterolemia, unspecified: Secondary | ICD-10-CM

## 2020-04-27 DIAGNOSIS — I25118 Atherosclerotic heart disease of native coronary artery with other forms of angina pectoris: Secondary | ICD-10-CM | POA: Diagnosis not present

## 2020-04-27 MED ORDER — HYDROCHLOROTHIAZIDE 12.5 MG PO CAPS
12.5000 mg | ORAL_CAPSULE | Freq: Every day | ORAL | 3 refills | Status: DC
Start: 2020-04-27 — End: 2020-10-26

## 2020-04-27 NOTE — Patient Instructions (Addendum)
Medication Instructions:  START the Hydrochlorothiazide 12.5 mg once daily-DO NOT START IT UNTIL AFTER YOU HAVE HAD THE CT OF THE AORTA.  *If you need a refill on your cardiac medications before your next appointment, please call your pharmacy*   Lab Work: None ordered If you have labs (blood work) drawn today and your tests are completely normal, you will receive your results only by: Marland Kitchen MyChart Message (if you have MyChart) OR . A paper copy in the mail If you have any lab test that is abnormal or we need to change your treatment, we will call you to review the results.   Testing/Procedures: Dr. Sallyanne Kuster has ordered a CT of the Aorta Non-Cardiac CT Angiography (CTA), is a special type of CT scan that uses a computer to produce multi-dimensional views of major blood vessels throughout the body. In CT angiography, a contrast material is injected through an IV to help visualize the blood vessels  Follow-Up: At [facility], you and your health needs are our priority.  As part of our continuing mission to provide you with exceptional heart care, we have created designated Provider Care Teams.  These Care Teams include your primary Cardiologist (physician) and Advanced Practice Providers (APPs -  Physician Assistants and Nurse Practitioners) who all work together to provide you with the care you need, when you need it.  We recommend signing up for the patient portal called "MyChart".  Sign up information is provided on this After Visit Summary.  MyChart is used to connect with patients for Virtual Visits (Telemedicine).  Patients are able to view lab/test results, encounter notes, upcoming appointments, etc.  Non-urgent messages can be sent to your provider as well.   To learn more about what you can do with MyChart, go to [email or web address].    Your next appointment:   12 month(s)  The format for your next appointment:   In Person  Provider:   You may see Sanda Klein, MD or one  of the following Advanced Practice Providers on your designated Care Team:    Almyra Deforest, PA-C  Fabian Sharp, PA-C or   Roby Lofts, [place]

## 2020-04-27 NOTE — Progress Notes (Signed)
Cardiology Office Note:    Date:  04/29/2020   ID:  Paul Terrell, DOB 12/09/1930, MRN [id number]  PCP:  Tonia Ghent, MD  [place] Cardiologist:  No primary care provider on file.  [facility] Electrophysiologist:  None   Referring MD: Tonia Ghent, MD   Chief Complaint  Patient presents with  . Chest Pain    History of Present Illness:    Paul Terrell is a 86 y.o. male with a hx of CAD (RCA stent 2001), HTN, HLP. DM type 2, ascending aortic aneurysm (4.4 cm January 2021), BPH, COVID-19 pneumonia last winter, previously a patient of Dr. Wynonia Lawman.  Cath Results 2001:  normal Left main, 30% stenosis distal LAD, 40% stenosis mid LAD, 90% stenosis proximal Diag 1, 90% stenosis mid RCA (stented), no significant disease CFX;  Nuclear study on 12/13/2010, normal perfusion, EF 73%.  He is very hard of hearing and his daughter helps with the review of systems.  He was seen in the emergency room with complaints of chest pain and dizziness April 26, the cardiac work-up was benign and MRI did not show stroke.  By far his dominant complaint is lower back pain due to lumbar spine degenerative disease.He has occasional chest discomfort, often exertional, but it resolves quickly. He denies orthopnea and PND, but has NYHA class 2 exertional dyspnea. He denies edema, claudication or neurological complaints.  Past Medical History:  Diagnosis Date  . Arthritis   . Basal cell carcinoma of scalp 12/16/07   Reexcision (Dr. Merril Abbe. Teressa Senter)  . BPH (benign prostatic hyperplasia)   . CAD (coronary artery disease) 11/01   stent placed  . Cataract   . Diabetes mellitus type II 11/01  . GERD (gastroesophageal reflux disease)    occasionally  . Hearing aid worn   . HOH (hard of hearing)   . Hyperlipemia 11/01  . Hypertension 11/01  . Ruptured disc, cervical    Neck C7 (Dr. Pearlie Oyster)    Past Surgical History:  Procedure Laterality Date  . BASAL CELL CARCINOMA EXCISION    .  BRONCHIAL BRUSHINGS  05/16/2018   Procedure: ESOPHAGEAL BRUSHINGS;  Surgeon: Irving Copas., MD;  Location: [facility];  Service: Gastroenterology;;  . Lillard Anes  02/2000   Dr. Janus Molder - Right  . CERVICAL FUSION    . COLONOSCOPY     2012  . COLONOSCOPY N/A 05/17/2018   Procedure: COLONOSCOPY;  Surgeon: Mansouraty, Telford Nab., MD;  Location: [facility];  Service: Gastroenterology;  Laterality: N/A;  . ESOPHAGOGASTRODUODENOSCOPY (EGD) WITH PROPOFOL N/A 05/16/2018   Procedure: ESOPHAGOGASTRODUODENOSCOPY (EGD) WITH PROPOFOL ;  Surgeon: Rush Landmark Telford Nab., MD;  Location: [facility];  Service: Gastroenterology;  Laterality: N/A;  . HOT HEMOSTASIS N/A 05/16/2018   Procedure: HOT HEMOSTASIS (ARGON PLASMA COAGULATION/BICAP);  Surgeon: Irving Copas., MD;  Location: [facility];  Service: Gastroenterology;  Laterality: N/A;  . KNEE ARTHROSCOPY  09/1999   Right  . LACERATION REPAIR  09/1999   Right Hand (Dr. Fredna Dow)  . LITHOTRIPSY  1991   Dr. Elyse Jarvis  . POLYPECTOMY  05/17/2018   Procedure: POLYPECTOMY;  Surgeon: Irving Copas., MD;  Location: [facility];  Service: Gastroenterology;;  . Cherre Robins CUFF REPAIR  02/05   Left    Current Medications: Current Meds  Medication Sig  . acetaminophen (TYLENOL) 500 MG tablet Take 500 mg by mouth every 6 (six) hours as needed (for pain or headaches).  Marland Kitchen aspirin 81 MG tablet Take 81 mg by mouth at bedtime.   Marland Kitchen  blood glucose meter kit and supplies Dispense Accu-chek meter. Check sugar once daily. DX E11.9  . Cholecalciferol (VITAMIN D-3) 5000 UNITS TABS Take 5,000 Units by mouth daily.   . ferrous sulfate 325 (65 FE) MG tablet Take 1 tablet (325 mg total) by mouth daily with breakfast.  . finasteride (PROSCAR) 5 MG tablet TAKE 1 TABLET (5 MG TOTAL) BY MOUTH DAILY.  Marland Kitchen glucose blood test strip Use up to 3 times a day.  Insulin treated DM2.  For Verio meter  . glyBURIDE (DIABETA) 5 MG tablet TAKE 2 TABLETS (10 MG TOTAL)  DAILY WITH BREAKFAST.  Marland Kitchen insulin glargine (LANTUS SOLOSTAR) 100 UNIT/ML Solostar Pen Inject 5-15 Units into the skin daily.  . Insulin Pen Needle (PEN NEEDLES) 31G X 5 MM MISC Use daily with insulin pen.  . Lancets (ONETOUCH ULTRASOFT) lancets Test once daily Dx 250.00   . lisinopril (ZESTRIL) 2.5 MG tablet Take 4 tablets (10 mg total) by mouth daily.  . Melatonin 5 MG TABS Take 5 mg by mouth at bedtime as needed.  . metoprolol succinate (TOPROL-XL) 50 MG 24 hr tablet TAKE 1 TABLET EVERY DAY  . mirtazapine (REMERON) 7.5 MG tablet Take 7.5 mg by mouth at bedtime.   . Multiple Vitamin (MULTIVITAMIN) tablet Take 1 tablet by mouth daily.    . pantoprazole (PROTONIX) 40 MG tablet Take 1 tablet (40 mg total) by mouth daily.  . pravastatin (PRAVACHOL) 20 MG tablet TAKE 1 TABLET (20 MG TOTAL) BY MOUTH AT BEDTIME.  . tamsulosin (FLOMAX) 0.4 MG CAPS capsule TAKE 1 CAPSULE (0.4 MG TOTAL) BY MOUTH AT BEDTIME.  . vitamin B-12 (CYANOCOBALAMIN) 250 MCG tablet Take 250 mcg by mouth 2 (two) times daily.      Allergies:   Nsaids, Citrus, Metformin and related, and Zocor [simvastatin]   Social History   Socioeconomic History  . Marital status: Married    Spouse name: Not on file  . Number of children: 2  . Years of education: Not on file  . Highest education level: Not on file  Occupational History  . Occupation: Carpentry  Tobacco Use  . Smoking status: Former Smoker    Packs/day: 1.00    Years: 25.00    Pack years: 25.00    Types: Cigarettes    Quit date: 01/09/1976    Years since quitting: 44.3  . Smokeless tobacco: Former Systems developer    Types: Chew  . Tobacco comment: quit smoking about 25 years ago  Vaping Use  . Vaping Use: Never used  Substance and Sexual Activity  . Alcohol use: No  . Drug use: No  . Sexual activity: Never  Other Topics Concern  . Not on file  Social History Narrative   Married 1955   2 kids   Retired from Therapist, art   Social Determinants of Adult nurse Strain:   . Difficulty of Paying Living Expenses:   Food Insecurity:   . Worried About Charity fundraiser in the Last Year:   . Arboriculturist in the Last Year:   Transportation Needs:   . Film/video editor (Medical):   Marland Kitchen Lack of Transportation (Non-Medical):   Physical Activity:   . Days of Exercise per Week:   . Minutes of Exercise per Session:   Stress:   . Feeling of Stress :   Social Connections:   . Frequency of Communication with Friends and Family:   . Frequency of Social Gatherings with Friends and Family:   .  Attends Religious Services:   . Active Member of Clubs or Organizations:   . Attends Archivist Meetings:   Marland Kitchen Marital Status:      Family History: The patient's family history includes Heart disease in his brother, brother, father, and mother; Hyperlipidemia in his brother; Hypertension in his brother, father, mother, and sister; Stroke in his mother.  ROS:   Please see the history of present illness.    All other systems reviewed and are negative.  EKGs/Labs/Other Studies Reviewed:    The following studies were reviewed today: CT chest Jan 2021, MRI brain April 2021, cath report 2001, nuclear stress test 2012  EKG:  EKG is not ordered today.  The ekg ordered 04/23/2020 demonstrates NSR, 1st deg AV block, leftward axis, T wave inversion V5-V6  Recent Labs: 08/15/2019: B Natriuretic Peptide 83.1; TSH 0.966 08/19/2019: Magnesium 1.8 12/14/2019: ALT 22 04/14/2020: BUN 13; Creatinine, Ser 1.15; Hemoglobin 13.6; Platelets 160.0; Potassium 4.3; Sodium 133  Recent Lipid Panel    Component Value Date/Time   CHOL 156 12/14/2019 0825   TRIG 130.0 12/14/2019 0825   HDL 44.50 12/14/2019 0825   CHOLHDL 4 12/14/2019 0825   VLDL 26.0 12/14/2019 0825   LDLCALC 86 12/14/2019 0825    Physical Exam:    VS:  BP (!) 172/63   Pulse (!) 54   Ht 5' 8"  (1.727 m)   Wt 168 lb 6.4 oz (76.4 kg)   SpO2 98%   BMI 25.61 kg/m     Wt  Readings from Last 3 Encounters:  04/27/20 168 lb 6.4 oz (76.4 kg)  04/21/20 164 lb 7 oz (74.6 kg)  02/25/20 167 lb 8 oz (76 kg)     GEN: Elderly frail. Well nourished, well developed in no acute distress HEENT: Normal NECK: No JVD; No carotid bruits LYMPHATICS: No lymphadenopathy CARDIAC: RRR, no murmurs, rubs, gallops RESPIRATORY:  Clear to auscultation without rales, wheezing or rhonchi  ABDOMEN: Soft, non-tender, non-distended MUSCULOSKELETAL:  No edema; No deformity . Walks with slow gait bent at the waist SKIN: Warm and dry NEUROLOGIC:  Alert and oriented x 3 PSYCHIATRIC:  Normal affect   ASSESSMENT:    1. Coronary artery disease of native artery of native heart with stable angina pectoris ([facility])   2. Thoracic aortic aneurysm without rupture ([place])   3. Essential hypertension   4. Hypercholesterolemia   5. Type 2 diabetes mellitus without complication, unspecified whether long term insulin use (HCC)    PLAN:    In order of problems listed above:  1. CAD: not sure if his chest pain is angina, but if so, it seems to have a pattern of stable angina. First goal is better BP control. HR precludes more beta blocker. Add HCTZ after his chest CT Angio. On ASA, statin. 2. AscAo aneurysm: recheck CT angio for progression. No audible murmur of AI, but note wide pulse pressure. 3. HTN: persistently and markedly elevated. Add HCTZ, send BP via mychart 2 weeks later. 4. HLP: goal LDL<70. On pravastatin, very low dose. 5. DM2: acceptable A1c at 7.4%.   Medication Adjustments/Labs and Tests Ordered: Current medicines are reviewed at length with the patient today.  Concerns regarding medicines are outlined above.  Orders Placed This Encounter  Procedures  . CT ANGIO CHEST AORTA W/CM & OR WO/CM   Meds ordered this encounter  Medications  . hydrochlorothiazide (MICROZIDE) 12.5 MG capsule    Sig: Take 1 capsule (12.5 mg total) by mouth daily.    Dispense:  90 capsule    Refill:  3     Patient Instructions  Medication Instructions:  START the Hydrochlorothiazide 12.5 mg once daily-DO NOT START IT UNTIL AFTER YOU HAVE HAD THE CT OF THE AORTA.  *If you need a refill on your cardiac medications before your next appointment, please call your pharmacy*   Lab Work: None ordered If you have labs (blood work) drawn today and your tests are completely normal, you will receive your results only by: Marland Kitchen MyChart Message (if you have MyChart) OR . A paper copy in the mail If you have any lab test that is abnormal or we need to change your treatment, we will call you to review the results.   Testing/Procedures: Dr. Sallyanne Kuster has ordered a CT of the Aorta Non-Cardiac CT Angiography (CTA), is a special type of CT scan that uses a computer to produce multi-dimensional views of major blood vessels throughout the body. In CT angiography, a contrast material is injected through an IV to help visualize the blood vessels  Follow-Up: At [facility], you and your health needs are our priority.  As part of our continuing mission to provide you with exceptional heart care, we have created designated Provider Care Teams.  These Care Teams include your primary Cardiologist (physician) and Advanced Practice Providers (APPs -  Physician Assistants and Nurse Practitioners) who all work together to provide you with the care you need, when you need it.  We recommend signing up for the patient portal called "MyChart".  Sign up information is provided on this After Visit Summary.  MyChart is used to connect with patients for Virtual Visits (Telemedicine).  Patients are able to view lab/test results, encounter notes, upcoming appointments, etc.  Non-urgent messages can be sent to your provider as well.   To learn more about what you can do with MyChart, go to [email or web address].    Your next appointment:   12 month(s)  The format for your next appointment:   In Person  Provider:   You may  see Sanda Klein, MD or one of the following Advanced Practice Providers on your designated Care Team:    Almyra Deforest, PA-C  Fabian Sharp, [place] or   Roby Lofts, PA-C      Signed, Sanda Klein, MD  04/29/2020 5:56 PM    [place]

## 2020-04-28 ENCOUNTER — Telehealth: Payer: Self-pay | Admitting: Cardiovascular Disease

## 2020-04-28 NOTE — Telephone Encounter (Signed)
Left message for patient regarding appointment for CTA chest aorta ordered by Dr. Karle Starch Friday 05/05/20 at 8:30 am at [facility] time is 8:15 am 1st floor admissions office----Liquids only 4 hours prior to study----will mail information to patient and requested he call with any questions or concerns.

## 2020-04-29 ENCOUNTER — Encounter: Payer: Self-pay | Admitting: Cardiovascular Disease

## 2020-05-05 ENCOUNTER — Encounter (HOSPITAL_COMMUNITY): Payer: Self-pay

## 2020-05-05 ENCOUNTER — Ambulatory Visit (HOSPITAL_COMMUNITY)
Admission: RE | Admit: 2020-05-05 | Discharge: 2020-05-05 | Disposition: A | Discharge status: Home / Self Care | Payer: Medicare Other | Source: Ambulatory Visit | Attending: Cardiovascular Disease | Admitting: Cardiovascular Disease

## 2020-05-05 ENCOUNTER — Other Ambulatory Visit: Payer: Self-pay

## 2020-05-05 DIAGNOSIS — I700 Atherosclerosis of aorta: Secondary | ICD-10-CM | POA: Diagnosis not present

## 2020-05-05 DIAGNOSIS — I712 Thoracic aortic aneurysm, without rupture, unspecified: Secondary | ICD-10-CM

## 2020-05-05 DIAGNOSIS — J9811 Atelectasis: Secondary | ICD-10-CM | POA: Diagnosis not present

## 2020-05-05 DIAGNOSIS — M4856XA Collapsed vertebra, not elsewhere classified, lumbar region, initial encounter for fracture: Secondary | ICD-10-CM | POA: Diagnosis not present

## 2020-05-05 MED ORDER — SODIUM CHLORIDE (PF) 0.9 % IJ SOLN
INTRAMUSCULAR | Status: AC
  Filled 2020-05-05: qty 50

## 2020-05-05 MED ORDER — IOHEXOL 350 MG/ML SOLN
75.0000 mL | Freq: Once | INTRAVENOUS | Status: AC | PRN
  Administered 2020-05-05: 75 mL via INTRAVENOUS

## 2020-05-08 ENCOUNTER — Encounter: Payer: Self-pay | Admitting: Family Medicine

## 2020-05-08 DIAGNOSIS — S32010A Wedge compression fracture of first lumbar vertebra, initial encounter for closed fracture: Secondary | ICD-10-CM | POA: Insufficient documentation

## 2020-05-31 ENCOUNTER — Telehealth: Payer: Self-pay | Admitting: Family Medicine

## 2020-05-31 NOTE — Telephone Encounter (Signed)
Do we have any availability here at [facility] tomorrow with me or anyone else?  Does [employer] have any availability?  If so, then would schedule tomorrow.    I don't know if it is reasonable to wait until Friday, so I would by default advise to be seen in the meantime.

## 2020-05-31 NOTE — Telephone Encounter (Signed)
Contacted pt's son and advised apt for tomorrow at 1 is available. [name] will bring pt tomorrow. [name] reports he is with pt now and he is doing somewhat better. Advised apt has been changed to tomorrow. Advised ER precautions. [name] verbalized understanding.

## 2020-05-31 NOTE — Telephone Encounter (Signed)
I got your note and no one has any available appts or work in for 06/01/20. Bridgett said that [facility] does not have available appts on 06/01/20. I spoke with Leafy Ro RN and she could not suggest anything else so I am not sure since family does not want to go to UC what to do. Do you want to add them to your schedule tomorrow even though I don't see an available appt to add on to.

## 2020-05-31 NOTE — Telephone Encounter (Signed)
Pt's son called and said he spoke with Access Nurse and they told him his father needs an appointment within 24 hours. I let him know your availability and he scheduled an appointment for Friday at 12:30. He said his father has a cut on his feet and is having swelling in both feet and ankles. I asked Rollene Fare and she told me patient needs to go to Urgent Care to be seen sooner. Pt's son said he has had bad experiences with UC and thinks father could wait until Friday for appointment. He said even going to ED he has had to wait 14 hours. Is patient ok to wait until Friday for an appointment?

## 2020-05-31 NOTE — Telephone Encounter (Signed)
[facility] [facility] - Client TELEPHONE ADVICE RECORD AccessNurse Patient Name: Paul Terrell Gender: Male DOB: 1931/01/11 Age: 86 Y 1 M 27 D Return Phone Number: [phone] (Primary), [phone] (Secondary) Address[place] [address] Client [facility] Day - Client Client Site [place] Physician Renford Dills - MD Contact Type Call Who Is Calling Patient / Member / Family / Caregiver Call Type Triage / Clinical Caller Name Magic Mohler Relationship To Patient Son Return Phone Number [phone] (Primary) Chief Complaint Puncture Wound / Stab Wound Reason for Call Symptomatic / Request for [place] states that his father's feet and ankles are swelling. He states that his father also has a sore at the end of one of his toes from where he wounded his toe with clippers. Translation No Nurse Assessment Nurse: Alinda Money, RN, Sarah Date/Time (Eastern Time): 05/31/2020 2:55:40 PM Confirm and document reason for call. If symptomatic, describe symptoms. ---Caller states father's feet and ankles have been swelling for the last week. States he also tried to cut his toenails and cut his toe on the end and looks like an open wound. Has the patient had close contact with a person known or suspected to have the novel coronavirus illness OR traveled / lives in area with major community spread (including international travel) in the last 14 days from the onset of symptoms? * If Asymptomatic, screen for exposure and travel within the last 14 days. ---No Does the patient have any new or worsening symptoms? ---Yes Will a triage be completed? ---Yes Related visit to physician within the last 2 weeks? ---No Does the PT have any chronic conditions? (i.e. diabetes, asthma, this includes High risk factors for pregnancy, etc.) ---Yes List chronic conditions. ---DM2, HTN, on  diuretics. Is this a behavioral health or substance abuse call? ---No Guidelines Guideline Title Affirmed Question Affirmed Notes Nurse Date/Time Eilene Ghazi Time) Leg Swelling and Edema Looks like a boil, infected sore, deep ulcer or other infected rash (spreading redness, pus) Goins, RN, Judson Roch 05/31/2020 2:57:24 PM PLEASE NOTE: All timestamps contained within this report are represented as [place] Standard Time. CONFIDENTIALTY NOTICE: This fax transmission is intended only for the addressee. It contains information that is legally privileged, confidential or otherwise protected from use or disclosure. If you are not the intended recipient, you are strictly prohibited from reviewing, disclosing, copying using or disseminating any of this information or taking any action in reliance on or regarding this information. If you have received this fax in error, please notify us immediately by telephone so that we can arrange for its return to [place]. Phone: [phone], Toll-Free: [phone], Fax: [phone] Page: 2 of 2 Call Id: [id number] [place]. Time Eilene Ghazi Time) Disposition Final User 05/31/2020 2:59:11 PM See PCP within 24 Hours Yes Goins, RN, [place] Disagree/Comply Comply Caller Understands Yes PreDisposition Call Doctor Care Advice Given Per Guideline SEE PCP WITHIN 24 HOURS: * IF OFFICE WILL BE OPEN: You need to be examined within the next 24 hours. Call your doctor (or NP/PA) when the office opens and make an appointment. ANTIBIOTIC OINTMENT - INFECTED AREA: * Put a small amount of antibiotic ointment on the infected area 3 times per day. * You can get this over-the-counter (OTC) at a drugstore. CALL BACK IF: * You become worse CARE ADVICE given per Leg Swelling and Edema (Adult) guideline. Referrals REFERRED TO PCP OFFICE

## 2020-05-31 NOTE — Telephone Encounter (Signed)
Agree. Thanks

## 2020-05-31 NOTE — Telephone Encounter (Addendum)
If they can't get to UC in the meantime, then okay with me to add on Thursday at 1pm on my schedule, assuming we can get help with rooming the patient.  Thanks.

## 2020-06-01 ENCOUNTER — Encounter: Payer: Self-pay | Admitting: Family Medicine

## 2020-06-01 ENCOUNTER — Other Ambulatory Visit: Payer: Self-pay

## 2020-06-01 ENCOUNTER — Ambulatory Visit (INDEPENDENT_AMBULATORY_CARE_PROVIDER_SITE_OTHER): Payer: Medicare Other | Admitting: Family Medicine

## 2020-06-01 VITALS — BP 144/54 | HR 77 | Temp 96.9°F | Ht 68.0 in | Wt 173.6 lb

## 2020-06-01 DIAGNOSIS — S32010D Wedge compression fracture of first lumbar vertebra, subsequent encounter for fracture with routine healing: Secondary | ICD-10-CM

## 2020-06-01 DIAGNOSIS — M8008XA Age-related osteoporosis with current pathological fracture, vertebra(e), initial encounter for fracture: Secondary | ICD-10-CM | POA: Diagnosis not present

## 2020-06-01 DIAGNOSIS — I25118 Atherosclerotic heart disease of native coronary artery with other forms of angina pectoris: Secondary | ICD-10-CM | POA: Diagnosis not present

## 2020-06-01 DIAGNOSIS — M899 Disorder of bone, unspecified: Secondary | ICD-10-CM | POA: Diagnosis not present

## 2020-06-01 DIAGNOSIS — D649 Anemia, unspecified: Secondary | ICD-10-CM | POA: Diagnosis not present

## 2020-06-01 DIAGNOSIS — E119 Type 2 diabetes mellitus without complications: Secondary | ICD-10-CM | POA: Diagnosis not present

## 2020-06-01 LAB — CBC WITH DIFFERENTIAL/PLATELET
Basophils Absolute: 0 10*3/uL (ref 0.0–0.1)
Basophils Relative: 0.6 % (ref 0.0–3.0)
Eosinophils Absolute: 0.1 10*3/uL (ref 0.0–0.7)
Eosinophils Relative: 1.7 % (ref 0.0–5.0)
HCT: 39.4 % (ref 39.0–52.0)
Hemoglobin: 13.4 g/dL (ref 13.0–17.0)
Lymphocytes Relative: 24.3 % (ref 12.0–46.0)
Lymphs Abs: 1.9 10*3/uL (ref 0.7–4.0)
MCHC: 34.1 g/dL (ref 30.0–36.0)
MCV: 93 fl (ref 78.0–100.0)
Monocytes Absolute: 0.7 10*3/uL (ref 0.1–1.0)
Monocytes Relative: 9.7 % (ref 3.0–12.0)
Neutro Abs: 4.9 10*3/uL (ref 1.4–7.7)
Neutrophils Relative %: 63.7 % (ref 43.0–77.0)
Platelets: 170 10*3/uL (ref 150.0–400.0)
RBC: 4.24 Mil/uL (ref 4.22–5.81)
RDW: 15.9 % — ABNORMAL HIGH (ref 11.5–15.5)
WBC: 7.6 10*3/uL (ref 4.0–10.5)

## 2020-06-01 LAB — IRON: Iron: 86 ug/dL (ref 42–165)

## 2020-06-01 LAB — VITAMIN D 25 HYDROXY (VIT D DEFICIENCY, FRACTURES): VITD: 63.6 ng/mL (ref 30.00–100.00)

## 2020-06-01 MED ORDER — LANTUS SOLOSTAR 100 UNIT/ML ~~LOC~~ SOPN: 25.0000 [IU] | PEN_INJECTOR | Freq: Every day | SUBCUTANEOUS | Status: DC

## 2020-06-01 NOTE — Patient Instructions (Addendum)
Go to the lab on the way out.   If you have mychart we'll likely use that to update you.     Keep the hydrocolloid dressing on your toe.  Update me as needed.    Take tylenol for the back pain.    If the swelling is consistently worse then let me know.    Take care.  Glad to see you.

## 2020-06-01 NOTE — Progress Notes (Signed)
This visit occurred during the SARS-CoV-2 public health emergency.  Safety protocols were in place, including screening questions prior to the visit, additional usage of staff PPE, and extensive cleaning of exam room while observing appropriate contact time as indicated for disinfecting solutions.  L1st nail trimmed but he nicked the distal toe.  This was 3-4 days ago.    Noted some foot swelling, variable from day to day.    His wife is at [place] [facility] with the plan to return home in about 1 week per family report.  Discussed.  L1 superior endplate compression fracture is new since the prior CT but remains age indeterminate.  He had fallen back this summer, after mowing the yard.  He still has lower back pain.  Sitting down helps.  Pain on standing.  No leg pain.  We talked about options.  No sugars above 200 recently.  Now on 25 units insulin.  Sugars are clearly better.  Still on iron.    History of anemia.  Follow-up labs pending.  See notes on labs.  Meds, vitals, and allergies reviewed.   ROS: Per HPI unless specifically indicated in ROS section   nad ncat rrr ctab abd soft, not ttp 1.5 x 0.5cm superficial but macerated lesion on the left first distal toe..  Had been covered with plastic bandaid.  No spreading erythema.  No discharge. No BLE edema.

## 2020-06-02 ENCOUNTER — Ambulatory Visit: Payer: Medicare Other | Admitting: Family Medicine

## 2020-06-04 NOTE — Assessment & Plan Note (Signed)
Reasonable to check vitamin D before we do anything else at this point.  See notes on labs.  He agrees.

## 2020-06-04 NOTE — Assessment & Plan Note (Signed)
No change in meds at this point.  Continue insulin 25 units as is for now.  Discussed foot care.  Area covered with hydrocolloid bandage that should take care of the issue with maceration.  Does not appear infected.  Intact capillary refill.  They will update me as needed.

## 2020-06-04 NOTE — Assessment & Plan Note (Signed)
History of anemia.  Follow-up labs pending.  See notes on labs.

## 2020-07-05 ENCOUNTER — Other Ambulatory Visit: Payer: Self-pay | Admitting: Family Medicine

## 2020-07-05 DIAGNOSIS — E119 Type 2 diabetes mellitus without complications: Secondary | ICD-10-CM

## 2020-07-05 DIAGNOSIS — D649 Anemia, unspecified: Secondary | ICD-10-CM

## 2020-07-19 ENCOUNTER — Other Ambulatory Visit: Payer: Medicare Other

## 2020-07-24 ENCOUNTER — Other Ambulatory Visit: Payer: Self-pay

## 2020-07-24 ENCOUNTER — Encounter: Payer: Self-pay | Admitting: Family Medicine

## 2020-07-24 ENCOUNTER — Ambulatory Visit (INDEPENDENT_AMBULATORY_CARE_PROVIDER_SITE_OTHER): Payer: Medicare Other | Admitting: Family Medicine

## 2020-07-24 DIAGNOSIS — E119 Type 2 diabetes mellitus without complications: Secondary | ICD-10-CM | POA: Diagnosis not present

## 2020-07-24 DIAGNOSIS — D649 Anemia, unspecified: Secondary | ICD-10-CM | POA: Diagnosis not present

## 2020-07-24 DIAGNOSIS — I25118 Atherosclerotic heart disease of native coronary artery with other forms of angina pectoris: Secondary | ICD-10-CM

## 2020-07-24 MED ORDER — LANTUS SOLOSTAR 100 UNIT/ML ~~LOC~~ SOPN: 30.0000 [IU] | PEN_INJECTOR | Freq: Every day | SUBCUTANEOUS | Status: DC

## 2020-07-24 NOTE — Patient Instructions (Signed)
Go to the lab on the way out.   If you have mychart we'll likely use that to update you.    Take care.  Glad to see you. Likely will need recheck in 3 months.

## 2020-07-24 NOTE — Progress Notes (Signed)
This visit occurred during the SARS-CoV-2 public health emergency.  Safety protocols were in place, including screening questions prior to the visit, additional usage of staff PPE, and extensive cleaning of exam room while observing appropriate contact time as indicated for disinfecting solutions.  Diabetes:  Using medications without difficulties: yes, still on glyburide and insulin at baseline Hypoglycemic episodes: no Hyperglycemic episodes: see below.   Feet problems: some BLE edema in the feet.  Some mild tingling in the feet.   Blood Sugars averaging: he had some elevated readings that were clearly lower on recheck, with possible old strips contributing to false elevation.   eye exam within last year: yes A1c pending.   H/o anemia with labs pending.  Still on iron.    Meds, vitals, and allergies reviewed.  ROS: Per HPI unless specifically indicated in ROS section   GEN: nad, alert and oriented HEENT: ncat NECK: supple w/o LA CV: rrr. PULM: ctab, no inc wob ABD: soft, +bs EXT: trace BLE edema SKIN: well perfused.

## 2020-07-25 ENCOUNTER — Other Ambulatory Visit: Payer: Self-pay | Admitting: Family Medicine

## 2020-07-25 DIAGNOSIS — Z23 Encounter for immunization: Secondary | ICD-10-CM | POA: Diagnosis not present

## 2020-07-25 LAB — BASIC METABOLIC PANEL
BUN: 17 mg/dL (ref 6–23)
CO2: 27 mEq/L (ref 19–32)
Calcium: 9.1 mg/dL (ref 8.4–10.5)
Chloride: 100 mEq/L (ref 96–112)
Creatinine, Ser: 1.38 mg/dL (ref 0.40–1.50)
GFR: 46.04 mL/min — ABNORMAL LOW (ref >60.00)
Glucose, Bld: 253 mg/dL — ABNORMAL HIGH (ref 70–99)
Potassium: 4.6 mEq/L (ref 3.5–5.1)
Sodium: 134 mEq/L — ABNORMAL LOW (ref 135–145)

## 2020-07-25 LAB — CBC WITH DIFFERENTIAL/PLATELET
Basophils Absolute: 0.1 10*3/uL (ref 0.0–0.1)
Basophils Relative: 0.9 % (ref 0.0–3.0)
Eosinophils Absolute: 0.2 10*3/uL (ref 0.0–0.7)
Eosinophils Relative: 2.3 % (ref 0.0–5.0)
HCT: 39.5 % (ref 39.0–52.0)
Hemoglobin: 13.7 g/dL (ref 13.0–17.0)
Lymphocytes Relative: 27.9 % (ref 12.0–46.0)
Lymphs Abs: 2 10*3/uL (ref 0.7–4.0)
MCHC: 34.8 g/dL (ref 30.0–36.0)
MCV: 94.8 fl (ref 78.0–100.0)
Monocytes Absolute: 0.6 10*3/uL (ref 0.1–1.0)
Monocytes Relative: 8.8 % (ref 3.0–12.0)
Neutro Abs: 4.4 10*3/uL (ref 1.4–7.7)
Neutrophils Relative %: 60.1 % (ref 43.0–77.0)
Platelets: 181 10*3/uL (ref 150.0–400.0)
RBC: 4.16 Mil/uL — ABNORMAL LOW (ref 4.22–5.81)
RDW: 14.2 % (ref 11.5–15.5)
WBC: 7.3 10*3/uL (ref 4.0–10.5)

## 2020-07-25 LAB — HEMOGLOBIN A1C: Hgb A1c MFr Bld: 8.1 % — ABNORMAL HIGH (ref 4.6–6.5)

## 2020-07-25 LAB — IRON: Iron: 130 ug/dL (ref 42–165)

## 2020-07-26 ENCOUNTER — Other Ambulatory Visit: Payer: Self-pay | Admitting: Family Medicine

## 2020-07-26 DIAGNOSIS — E119 Type 2 diabetes mellitus without complications: Secondary | ICD-10-CM

## 2020-07-26 DIAGNOSIS — D649 Anemia, unspecified: Secondary | ICD-10-CM

## 2020-07-26 NOTE — Assessment & Plan Note (Signed)
No change in meds at this point.  See notes on labs.  Continue glyburide and insulin.  We can make follow-up plans once I see his labs.

## 2020-07-26 NOTE — Assessment & Plan Note (Signed)
No change in meds at this point.  See notes on labs.  Continue iron.  We can make follow-up plans once I see his labs.

## 2020-07-27 ENCOUNTER — Telehealth: Payer: Self-pay | Admitting: Family Medicine

## 2020-07-27 NOTE — Progress Notes (Signed)
  Chronic Care Management   Outreach Note  07/27/2020 Name: Paul Terrell MRN: [id number] DOB: 07-20-1931  Referred by: Tonia Ghent, MD Reason for referral : Chronic Care Management   An unsuccessful telephone outreach was attempted today. The patient was referred to the pharmacist for assistance with care management and care coordination.   Follow Up Plan:   [name]  Upstream Scheduler \

## 2020-08-08 ENCOUNTER — Telehealth: Payer: Self-pay | Admitting: *Deleted

## 2020-08-08 MED ORDER — LANTUS SOLOSTAR 100 UNIT/ML ~~LOC~~ SOPN
30.0000 [IU] | PEN_INJECTOR | Freq: Every day | SUBCUTANEOUS | 3 refills | Status: DC
Start: 2020-08-08 — End: 2020-08-14

## 2020-08-08 NOTE — Telephone Encounter (Signed)
Called and updated patients son that prescription was sent in.

## 2020-08-08 NOTE — Telephone Encounter (Signed)
Patient's son [name] left a voicemail stating that they tried to get a refill on his dad's lantus from [facility] and they said it was too soon. [name] stated that the lantus dose they have on hand is a lower dose than what he is on. Patient's son stated that sending a new script with the correct dose should clear this up. Patent's son stated that his dad use to be on 5-15 units.  [name] requested that this be sent in today. Bronson Curb if patient does not get the mail order in before he gets ready to run out to call the office and a script can be sent to a local pharmacy to last him so he will not run out.

## 2020-08-08 NOTE — Telephone Encounter (Signed)
Sent. Thanks. Please let him know.

## 2020-08-14 ENCOUNTER — Other Ambulatory Visit: Payer: Self-pay | Admitting: Family Medicine

## 2020-08-14 MED ORDER — LANTUS SOLOSTAR 100 UNIT/ML ~~LOC~~ SOPN
30.0000 [IU] | PEN_INJECTOR | Freq: Every day | SUBCUTANEOUS | 0 refills | Status: DC
Start: 2020-08-14 — End: 2020-12-08

## 2020-08-14 MED ORDER — MIRTAZAPINE 7.5 MG PO TABS
7.5000 mg | ORAL_TABLET | Freq: Every day | ORAL | 1 refills | Status: DC
Start: 2020-08-14 — End: 2021-03-12

## 2020-08-14 NOTE — Addendum Note (Signed)
Addended by: Helene Shoe on: 08/14/2020 02:32 PM   Modules accepted: Orders

## 2020-08-14 NOTE — Telephone Encounter (Signed)
Pharmacy requests refill on: Lisinopril 2.5 mg  LAST REFILL: 04/21/2020 LAST OV: 07/24/2020 NEXT OV: 11/03/2020 PHARMACY: [facility]   Pharmacy requests refill on: Finasteride 5 mg  LAST REFILL: 02/02/2020 LAST OV: 07/24/2020 NEXT OV: 11/03/2020 PHARMACY: [facility]

## 2020-08-14 NOTE — Telephone Encounter (Signed)
If not sleeping well, then I would try mirtazapine at night.  I sent the prescription.  Let me know if this is not tolerated or effective.  Thanks.

## 2020-08-14 NOTE — Addendum Note (Signed)
Addended by: Tonia Ghent on: 08/14/2020 11:32 PM   Modules accepted: Orders

## 2020-08-14 NOTE — Telephone Encounter (Addendum)
[name] (DPR signed) left v/m problem with shipment of lantus from mail order and request 1 pen of lantus to [facility]. [name] also has another question about a med. I spoke with [name] and request small amt of lantus to [facility] until gets med from [facility]. [name] was filling pts med boxes at home and does not see mirtazapine 7.5 mg bottle. Pt was filling boxes off med list and does not see mirtazapine in pts med box; do not see where Dr [name] has filled before. [name] said if Dr [name] thinks pt should be on med to send rx to [facility] mail order pharmacy. [name] said pt does not sleep well at night; pt is up and down all night. I spoke with Heidi at [facility] and they cannot break a box and pt will get 5 pens of lantus; Heidi said to add note under dr comments will need mail order override.  I sent lantus pens as instructed to [facility] and [name] voiced understanding. [name] will wait for cb to see if pt should be on the mirtazapine.

## 2020-08-15 DIAGNOSIS — E119 Type 2 diabetes mellitus without complications: Secondary | ICD-10-CM | POA: Diagnosis not present

## 2020-08-15 DIAGNOSIS — H353131 Nonexudative age-related macular degeneration, bilateral, early dry stage: Secondary | ICD-10-CM | POA: Diagnosis not present

## 2020-08-15 DIAGNOSIS — H52203 Unspecified astigmatism, bilateral: Secondary | ICD-10-CM | POA: Diagnosis not present

## 2020-08-15 LAB — HM DIABETES EYE EXAM

## 2020-08-16 NOTE — Telephone Encounter (Signed)
Called and spoke with patients son and updated him of Dr. Josefine Class recommendations. Patients son verbalized understanding.

## 2020-08-19 ENCOUNTER — Other Ambulatory Visit: Payer: Self-pay | Admitting: Family Medicine

## 2020-09-19 ENCOUNTER — Other Ambulatory Visit: Payer: Self-pay | Admitting: Family Medicine

## 2020-09-26 ENCOUNTER — Telehealth: Payer: Self-pay | Admitting: *Deleted

## 2020-09-26 NOTE — Telephone Encounter (Signed)
Amy nurse with [facility] left a voicemail stating that the patient needs the new dose of Lisinopril sent to his mail order pharmacy. Amy stated that patient is now on Lisinopril 10 mg so a 90 supply of that needs sent in. Please let her know that the script has been sent in. Pharmacy [facility]

## 2020-09-27 NOTE — Telephone Encounter (Signed)
Please verify the lisinopril dose with the patient/family.  We have him listed as 2.5 mg a day.  Please let me know.  Thanks.

## 2020-09-29 NOTE — Telephone Encounter (Signed)
Pt son called back and stated that he taking 4 2.5mg  of lisinopril at night

## 2020-09-29 NOTE — Telephone Encounter (Signed)
FYI.... Dr [name]

## 2020-09-29 NOTE — Telephone Encounter (Signed)
Noted. Thanks.  Will await update.   

## 2020-09-29 NOTE — Telephone Encounter (Signed)
FYI....  Spoke with son and he stated that he will contact us with the correct dosage amt as he was not home at this time.  Leamon Arnt

## 2020-10-02 MED ORDER — LISINOPRIL 10 MG PO TABS
10.0000 mg | ORAL_TABLET | Freq: Every day | ORAL | 3 refills | Status: DC
Start: 2020-10-02 — End: 2020-10-17

## 2020-10-02 NOTE — Telephone Encounter (Signed)
Called and updated patients son of change. Patients son verbalized understanding.

## 2020-10-02 NOTE — Addendum Note (Signed)
Addended by: Tonia Ghent on: 10/02/2020 02:43 PM   Modules accepted: Orders

## 2020-10-02 NOTE — Telephone Encounter (Signed)
Rx sent for 10 mg tablets.  Notify patient/family.  He will only take 1 tablet to equal 10 mg, instead of 4 of the 2.5 mg tabs.

## 2020-10-03 ENCOUNTER — Ambulatory Visit (INDEPENDENT_AMBULATORY_CARE_PROVIDER_SITE_OTHER): Payer: Medicare Other | Admitting: Primary Care

## 2020-10-03 ENCOUNTER — Other Ambulatory Visit: Payer: Self-pay

## 2020-10-03 ENCOUNTER — Ambulatory Visit (INDEPENDENT_AMBULATORY_CARE_PROVIDER_SITE_OTHER)
Admission: RE | Admit: 2020-10-03 | Discharge: 2020-10-03 | Disposition: A | Discharge status: Home / Self Care | Payer: Medicare Other | Source: Ambulatory Visit | Attending: Primary Care | Admitting: Primary Care

## 2020-10-03 ENCOUNTER — Encounter: Payer: Self-pay | Admitting: Primary Care

## 2020-10-03 VITALS — BP 140/68 | HR 72 | Temp 97.7°F | Ht 68.0 in | Wt 184.0 lb

## 2020-10-03 DIAGNOSIS — M79671 Pain in right foot: Secondary | ICD-10-CM | POA: Diagnosis not present

## 2020-10-03 DIAGNOSIS — M19071 Primary osteoarthritis, right ankle and foot: Secondary | ICD-10-CM | POA: Diagnosis not present

## 2020-10-03 DIAGNOSIS — Z9889 Other specified postprocedural states: Secondary | ICD-10-CM | POA: Diagnosis not present

## 2020-10-03 LAB — BASIC METABOLIC PANEL
BUN: 22 mg/dL (ref 6–23)
CO2: 28 mEq/L (ref 19–32)
Calcium: 9.2 mg/dL (ref 8.4–10.5)
Chloride: 101 mEq/L (ref 96–112)
Creatinine, Ser: 1.37 mg/dL (ref 0.40–1.50)
GFR: 46.38 mL/min — ABNORMAL LOW (ref >60.00)
Glucose, Bld: 397 mg/dL — ABNORMAL HIGH (ref 70–99)
Potassium: 5.1 mEq/L (ref 3.5–5.1)
Sodium: 133 mEq/L — ABNORMAL LOW (ref 135–145)

## 2020-10-03 LAB — CBC WITH DIFFERENTIAL/PLATELET
Basophils Absolute: 0.1 10*3/uL (ref 0.0–0.1)
Basophils Relative: 1.1 % (ref 0.0–3.0)
Eosinophils Absolute: 0.3 10*3/uL (ref 0.0–0.7)
Eosinophils Relative: 4.5 % (ref 0.0–5.0)
HCT: 41.7 % (ref 39.0–52.0)
Hemoglobin: 14.4 g/dL (ref 13.0–17.0)
Lymphocytes Relative: 32.5 % (ref 12.0–46.0)
Lymphs Abs: 2.2 10*3/uL (ref 0.7–4.0)
MCHC: 34.7 g/dL (ref 30.0–36.0)
MCV: 97.2 fl (ref 78.0–100.0)
Monocytes Absolute: 0.8 10*3/uL (ref 0.1–1.0)
Monocytes Relative: 11.8 % (ref 3.0–12.0)
Neutro Abs: 3.4 10*3/uL (ref 1.4–7.7)
Neutrophils Relative %: 50.1 % (ref 43.0–77.0)
Platelets: 155 10*3/uL (ref 150.0–400.0)
RBC: 4.29 Mil/uL (ref 4.22–5.81)
RDW: 13.5 % (ref 11.5–15.5)
WBC: 6.8 10*3/uL (ref 4.0–10.5)

## 2020-10-03 LAB — URIC ACID: Uric Acid, Serum: 5.5 mg/dL (ref 4.0–7.8)

## 2020-10-03 MED ORDER — SULFAMETHOXAZOLE-TRIMETHOPRIM 800-160 MG PO TABS: 1.0000 | ORAL_TABLET | Freq: Two times a day (BID) | ORAL | 0 refills | Status: DC

## 2020-10-03 NOTE — Patient Instructions (Signed)
Start Bactrim DS (sulfamethoxazole/trimethoprim) tablets for infection. Take 1 tablet by mouth twice daily for 7 days.  Stop by the lab and xray prior to leaving today. I will notify you of your results once received.   It was a pleasure meeting you!

## 2020-10-03 NOTE — Progress Notes (Signed)
Subjective:    Patient ID: Paul Terrell, male    DOB: November 18, 1930, 87 y.o.   MRN: [id number]  HPI  This visit occurred during the SARS-CoV-2 public health emergency.  Safety protocols were in place, including screening questions prior to the visit, additional usage of staff PPE, and extensive cleaning of exam room while observing appropriate contact time as indicated for disinfecting solutions.   Paul Terrell is a 87 year old male patient of Dr. [name] with a history of CAD, hypertension, acute respiratory failure with hypoxia, type 2 diabetes, BPH, lower extremity pain, thrombocytopenia, abnormal gait, back pain who presents today with a chief complaint of foot pain.  His pain is located to the right medial at first metatarsal joint. A sore has developed to the site. History of bunion surgery to the exact spot of pain 20+ years ago, he's had no problem to that site until two weeks ago. The sore is bothersome when putting on shoes on and with walking. He has minimal pain with rest.  He denies fevers, trauma/injury, fevers, history of gout. A1C of 8.1 in November 2021. Glucose readings at home have been in the 100-120 range recently.   Review of Systems  Constitutional: Negative for fever.  Skin: Positive for color change and wound.       Past Medical History:  Diagnosis Date  . Arthritis   . Basal cell carcinoma of scalp 12/16/07   Reexcision (Dr. Merril Abbe. Teressa Senter)  . BPH (benign prostatic hyperplasia)   . CAD (coronary artery disease) 11/01   stent placed  . Cataract   . Diabetes mellitus type II 11/01  . GERD (gastroesophageal reflux disease)    occasionally  . Hearing aid worn   . HOH (hard of hearing)   . Hyperlipemia 11/01  . Hypertension 11/01  . Ruptured disc, cervical    Neck C7 (Dr. Pearlie Oyster)     Social History   Socioeconomic History  . Marital status: Married    Spouse name: Not on file  . Number of children: 2  . Years of education: Not on file  . Highest  education level: Not on file  Occupational History  . Occupation: Carpentry  Tobacco Use  . Smoking status: Former Smoker    Packs/day: 1.00    Years: 25.00    Pack years: 25.00    Types: Cigarettes    Quit date: 01/09/1976    Years since quitting: 44.7  . Smokeless tobacco: Former Systems developer    Types: Chew  . Tobacco comment: quit smoking about 25 years ago  Vaping Use  . Vaping Use: Never used  Substance and Sexual Activity  . Alcohol use: No  . Drug use: No  . Sexual activity: Never  Other Topics Concern  . Not on file  Social History Narrative   Married 1955   2 kids   Retired from Therapist, art   Social Determinants of Radio broadcast assistant Strain: Not on file  Food Insecurity: Not on file  Transportation Needs: Not on file  Physical Activity: Not on file  Stress: Not on file  Social Connections: Not on file  Intimate Partner Violence: Not on file    Past Surgical History:  Procedure Laterality Date  . BASAL CELL CARCINOMA EXCISION    . BRONCHIAL BRUSHINGS  05/16/2018   Procedure: ESOPHAGEAL BRUSHINGS;  Surgeon: Irving Copas., MD;  Location: [place];  Service: Gastroenterology;;  . Lillard Anes  02/2000   Dr. Janus Molder - Right  .  CERVICAL FUSION    . COLONOSCOPY     2012  . COLONOSCOPY N/A 05/17/2018   Procedure: COLONOSCOPY;  Surgeon: Mansouraty, Telford Nab., MD;  Location: [place];  Service: Gastroenterology;  Laterality: N/A;  . ESOPHAGOGASTRODUODENOSCOPY (EGD) WITH PROPOFOL N/A 05/16/2018   Procedure: ESOPHAGOGASTRODUODENOSCOPY (EGD) WITH PROPOFOL ;  Surgeon: Rush Landmark Telford Nab., MD;  Location: [place];  Service: Gastroenterology;  Laterality: N/A;  . HOT HEMOSTASIS N/A 05/16/2018   Procedure: HOT HEMOSTASIS (ARGON PLASMA COAGULATION/BICAP);  Surgeon: Irving Copas., MD;  Location: [place];  Service: Gastroenterology;  Laterality: N/A;  . KNEE ARTHROSCOPY  09/1999   Right  . LACERATION REPAIR  09/1999    Right Hand (Dr. Fredna Dow)  . LITHOTRIPSY  1991   Dr. Elyse Jarvis  . POLYPECTOMY  05/17/2018   Procedure: POLYPECTOMY;  Surgeon: Mansouraty, Telford Nab., MD;  Location: [place];  Service: Gastroenterology;;  . Cherre Robins CUFF REPAIR  02/05   Left    Family History  Problem Relation Age of Onset  . Stroke Mother        Lived 2 years  . Heart disease Mother        CAD, Angioplasty X 2  . Hypertension Mother   . Heart disease Father        MI  . Hypertension Father   . Hypertension Sister   . Hypertension Brother   . Heart disease Brother        MI  . Hyperlipidemia Brother   . Heart disease Brother        MI    Allergies  Allergen Reactions  . Nsaids Other (See Comments)    H/o anemia  . Citrus Hives  . Metformin And Related     GI upset.    . Zocor [Simvastatin] Other (See Comments)    Myalgia     Current Outpatient Medications on File Prior to Visit  Medication Sig Dispense Refill  . acetaminophen (TYLENOL) 500 MG tablet Take 500 mg by mouth every 6 (six) hours as needed (for pain or headaches).    Marland Kitchen aspirin 81 MG tablet Take 81 mg by mouth at bedtime.    . blood glucose meter kit and supplies Dispense Accu-chek meter. Check sugar once daily. DX E11.9 1 each 6  . Cholecalciferol (VITAMIN D-3) 5000 UNITS TABS Take 5,000 Units by mouth daily.     . ferrous sulfate 325 (65 FE) MG tablet Take 1 tablet (325 mg total) by mouth daily with breakfast.    . finasteride (PROSCAR) 5 MG tablet TAKE 1 TABLET (5 MG TOTAL) BY MOUTH DAILY. 90 tablet 1  . glucose blood test strip Use up to 3 times a day.  Insulin treated DM2.  For Verio meter 100 each 12  . glyBURIDE (DIABETA) 5 MG tablet TAKE 2 TABLETS (10 MG TOTAL) DAILY WITH BREAKFAST. 180 tablet 1  . insulin glargine (LANTUS SOLOSTAR) 100 UNIT/ML Solostar Pen Inject 30 Units into the skin daily. Dx E11.9 15 mL 0  . Insulin Pen Needle (PEN NEEDLES) 31G X 5 MM MISC Use daily with insulin pen. 100 each 3  . Lancets (ONETOUCH ULTRASOFT)  lancets Test once daily Dx 250.00    . lisinopril (ZESTRIL) 10 MG tablet Take 1 tablet (10 mg total) by mouth daily. 90 tablet 3  . Melatonin 5 MG TABS Take 5 mg by mouth at bedtime as needed.    . metoprolol succinate (TOPROL-XL) 50 MG 24 hr tablet TAKE 1 TABLET EVERY DAY 90 tablet 1  .  mirtazapine (REMERON) 7.5 MG tablet Take 1 tablet (7.5 mg total) by mouth at bedtime. 90 tablet 1  . Multiple Vitamin (MULTIVITAMIN) tablet Take 1 tablet by mouth daily.    . pantoprazole (PROTONIX) 40 MG tablet Take 1 tablet (40 mg total) by mouth daily. 90 tablet 3  . pravastatin (PRAVACHOL) 20 MG tablet TAKE 1 TABLET AT BEDTIME 90 tablet 3  . tamsulosin (FLOMAX) 0.4 MG CAPS capsule TAKE 1 CAPSULE (0.4 MG TOTAL) BY MOUTH AT BEDTIME. 90 capsule 3  . vitamin B-12 (CYANOCOBALAMIN) 250 MCG tablet Take 250 mcg by mouth 2 (two) times daily.    . hydrochlorothiazide (MICROZIDE) 12.5 MG capsule Take 1 capsule (12.5 mg total) by mouth daily. 90 capsule 3   No current facility-administered medications on file prior to visit.    BP 140/68   Pulse 72   Temp 97.7 F (36.5 C) (Temporal)   Ht 5' 8"  (1.727 m)   Wt 184 lb (83.5 kg)   SpO2 98%   BMI 27.98 kg/m    Objective:   Physical Exam Skin:    General: Skin is warm and dry.     Findings: Erythema present.     Comments: Mild erythema with warmth to right first metatarsal joint. Callous present, closed. Tender with palpation. Ambulates with limp.  Neurological:     Mental Status: He is alert.            Assessment & Plan:

## 2020-10-03 NOTE — Assessment & Plan Note (Signed)
Acute x 2 weeks. Callous, erythema, warmth to site of pain.  Checking xray of the foot today. Labs pending including CBC, uric acid, BMP.  Will cover for suspected infection. Rx for Bactrim DS sent to pharmacy.   Discussed the importance of checking shoes for comfort and callous prevention. Discussed wearing shoes at all times given diabetes.   Await results.

## 2020-10-17 ENCOUNTER — Other Ambulatory Visit: Payer: Self-pay

## 2020-10-17 ENCOUNTER — Telehealth: Payer: Self-pay

## 2020-10-17 MED ORDER — LISINOPRIL 10 MG PO TABS
10.0000 mg | ORAL_TABLET | Freq: Every day | ORAL | 3 refills | Status: DC
Start: 2020-10-17 — End: 2020-11-21

## 2020-10-17 MED ORDER — GLUCOSE BLOOD VI STRP
ORAL_STRIP | 12 refills | Status: DC
Start: 2020-10-17 — End: 2021-10-25

## 2020-10-17 NOTE — Telephone Encounter (Signed)
Error see med refill request.

## 2020-10-17 NOTE — Telephone Encounter (Addendum)
[name] (DPR signed) said pt has changed ins co and needs the verio meter test strips and lisinopril 10 mg taking one daily # 90 sent to [facility]. Pt last seen by Dr [name] on 07/24/20 and pt has CPX scheduled for 11/03/20. Per protocol refilled verio test strips # 100 x 12 and lisinopril 10 mg # 90 x 3 to [facility]. [name] voiced understanding and appreciative.

## 2020-10-18 ENCOUNTER — Telehealth: Payer: Self-pay | Admitting: Family Medicine

## 2020-10-18 NOTE — Chronic Care Management (AMB) (Signed)
  Chronic Care Management   Note  10/18/2020 Name: Paul Terrell MRN: [id number] DOB: November 28, 1930  [name] is a 87 y.o. year old male who is a primary care patient of Tonia Ghent, MD. I reached out to [name] by phone today in response to a referral sent by Paul Terrell's PCP, Tonia Ghent, MD.   Paul Terrell was given information about Chronic Care Management services today including:  1. CCM service includes personalized support from designated clinical staff supervised by his physician, including individualized plan of care and coordination with other care providers 2. 24/7 contact phone numbers for assistance for urgent and routine care needs. 3. Service will only be billed when office clinical staff spend 20 minutes or more in a month to coordinate care. 4. Only one practitioner may furnish and bill the service in a calendar month. 5. The patient may stop CCM services at any time (effective at the end of the month) by phone call to the office staff.   Paul Terrell verbally agreed to assistance and services provided by embedded care coordination/care management team today.  Follow up plan:   [place]

## 2020-10-19 ENCOUNTER — Ambulatory Visit (INDEPENDENT_AMBULATORY_CARE_PROVIDER_SITE_OTHER): Payer: Medicare Other

## 2020-10-19 ENCOUNTER — Ambulatory Visit
Admission: EM | Admit: 2020-10-19 | Discharge: 2020-10-19 | Disposition: A | Discharge status: Home / Self Care | Payer: Medicare Other | Attending: Urgent Care | Admitting: Urgent Care

## 2020-10-19 ENCOUNTER — Other Ambulatory Visit: Payer: Self-pay

## 2020-10-19 ENCOUNTER — Telehealth: Payer: Self-pay | Admitting: *Deleted

## 2020-10-19 ENCOUNTER — Encounter: Payer: Self-pay | Admitting: Emergency Medicine

## 2020-10-19 DIAGNOSIS — M79671 Pain in right foot: Secondary | ICD-10-CM | POA: Diagnosis not present

## 2020-10-19 DIAGNOSIS — S91301A Unspecified open wound, right foot, initial encounter: Secondary | ICD-10-CM | POA: Diagnosis not present

## 2020-10-19 DIAGNOSIS — Z9889 Other specified postprocedural states: Secondary | ICD-10-CM | POA: Diagnosis not present

## 2020-10-19 DIAGNOSIS — M19071 Primary osteoarthritis, right ankle and foot: Secondary | ICD-10-CM | POA: Diagnosis not present

## 2020-10-19 DIAGNOSIS — R2241 Localized swelling, mass and lump, right lower limb: Secondary | ICD-10-CM

## 2020-10-19 NOTE — ED Triage Notes (Signed)
Pt presents today with right foot pain x 1 month, xrayed on 10/03/20 and was neg for fracture, continued pain/redness.

## 2020-10-19 NOTE — Discharge Instructions (Addendum)
Please make sure you schedule an appointment with [facility] for a consultation regarding his persistent foot pain.  In the meantime, do not use any nonsteroidal anti-inflammatories (NSAIDs) like ibuprofen, Motrin, naproxen, Aleve, etc. which are all available over-the-counter.  Please just use Tylenol at a dose of 500mg -650mg  once every 6 hours as needed for your foot pain, aches, pains, fevers.

## 2020-10-19 NOTE — Telephone Encounter (Signed)
Son called back and we sent him to access nurse line

## 2020-10-19 NOTE — ED Provider Notes (Signed)
[place]   MRN: [id number] DOB: 1931/02/08  Subjective:   Paul Terrell is a 87 y.o. male presenting for 1 month history of persistent right foot pain, swelling.  Patient has been evaluated for this and had imaging, blood work done.  He had normal CBC, negative uric acid.  X-ray did not show any fracture or osteomyelitis.  Presents today because they are concerned that it is still bothering him, has redness and some swelling about the medial aspect of his right foot.  Has a remote history of having a bunion surgery of the same area.  He is a severely uncontrolled diabetic.  Still has sensation of his foot.  No trauma, falls.  No current facility-administered medications for this encounter.  Current Outpatient Medications:  .  acetaminophen (TYLENOL) 500 MG tablet, Take 500 mg by mouth every 6 (six) hours as needed (for pain or headaches)., Disp: , Rfl:  .  aspirin 81 MG tablet, Take 81 mg by mouth at bedtime., Disp: , Rfl:  .  blood glucose meter kit and supplies, Dispense Accu-chek meter. Check sugar once daily. DX E11.9, Disp: 1 each, Rfl: 6 .  Cholecalciferol (VITAMIN D-3) 5000 UNITS TABS, Take 5,000 Units by mouth daily. , Disp: , Rfl:  .  ferrous sulfate 325 (65 FE) MG tablet, Take 1 tablet (325 mg total) by mouth daily with breakfast., Disp: , Rfl:  .  finasteride (PROSCAR) 5 MG tablet, TAKE 1 TABLET (5 MG TOTAL) BY MOUTH DAILY., Disp: 90 tablet, Rfl: 1 .  glucose blood test strip, Use up to 3 times a day.  Insulin treated DM2.  For Verio meter, Disp: 100 each, Rfl: 12 .  glyBURIDE (DIABETA) 5 MG tablet, TAKE 2 TABLETS (10 MG TOTAL) DAILY WITH BREAKFAST., Disp: 180 tablet, Rfl: 1 .  hydrochlorothiazide (MICROZIDE) 12.5 MG capsule, Take 1 capsule (12.5 mg total) by mouth daily., Disp: 90 capsule, Rfl: 3 .  insulin glargine (LANTUS SOLOSTAR) 100 UNIT/ML Solostar Pen, Inject 30 Units into the skin daily. Dx E11.9, Disp: 15 mL, Rfl: 0 .  Insulin Pen Needle (PEN NEEDLES) 31G X  5 MM MISC, Use daily with insulin pen., Disp: 100 each, Rfl: 3 .  Lancets (ONETOUCH ULTRASOFT) lancets, Test once daily Dx 250.00, Disp: , Rfl:  .  lisinopril (ZESTRIL) 10 MG tablet, Take 1 tablet (10 mg total) by mouth daily., Disp: 90 tablet, Rfl: 3 .  Melatonin 5 MG TABS, Take 5 mg by mouth at bedtime as needed., Disp: , Rfl:  .  metoprolol succinate (TOPROL-XL) 50 MG 24 hr tablet, TAKE 1 TABLET EVERY DAY, Disp: 90 tablet, Rfl: 1 .  mirtazapine (REMERON) 7.5 MG tablet, Take 1 tablet (7.5 mg total) by mouth at bedtime., Disp: 90 tablet, Rfl: 1 .  Multiple Vitamin (MULTIVITAMIN) tablet, Take 1 tablet by mouth daily., Disp: , Rfl:  .  pantoprazole (PROTONIX) 40 MG tablet, Take 1 tablet (40 mg total) by mouth daily., Disp: 90 tablet, Rfl: 3 .  pravastatin (PRAVACHOL) 20 MG tablet, TAKE 1 TABLET AT BEDTIME, Disp: 90 tablet, Rfl: 3 .  sulfamethoxazole-trimethoprim (BACTRIM DS) 800-160 MG tablet, Take 1 tablet by mouth 2 (two) times daily. For infection., Disp: 14 tablet, Rfl: 0 .  tamsulosin (FLOMAX) 0.4 MG CAPS capsule, TAKE 1 CAPSULE (0.4 MG TOTAL) BY MOUTH AT BEDTIME., Disp: 90 capsule, Rfl: 3 .  vitamin B-12 (CYANOCOBALAMIN) 250 MCG tablet, Take 250 mcg by mouth 2 (two) times daily., Disp: , Rfl:    Allergies  Allergen Reactions  . Nsaids Other (See Comments)    H/o anemia  . Citrus Hives  . Metformin And Related     GI upset.    . Zocor [Simvastatin] Other (See Comments)    Myalgia     Past Medical History:  Diagnosis Date  . Arthritis   . Basal cell carcinoma of scalp 12/16/07   Reexcision (Dr. Merril Abbe. Teressa Senter)  . BPH (benign prostatic hyperplasia)   . CAD (coronary artery disease) 11/01   stent placed  . Cataract   . Diabetes mellitus type II 11/01  . GERD (gastroesophageal reflux disease)    occasionally  . Hearing aid worn   . HOH (hard of hearing)   . Hyperlipemia 11/01  . Hypertension 11/01  . Ruptured disc, cervical    Neck C7 (Dr. Pearlie Oyster)     Past Surgical  History:  Procedure Laterality Date  . BASAL CELL CARCINOMA EXCISION    . BRONCHIAL BRUSHINGS  05/16/2018   Procedure: ESOPHAGEAL BRUSHINGS;  Surgeon: Irving Copas., MD;  Location: [place];  Service: Gastroenterology;;  . Lillard Anes  02/2000   Dr. Janus Molder - Right  . CERVICAL FUSION    . COLONOSCOPY     2012  . COLONOSCOPY N/A 05/17/2018   Procedure: COLONOSCOPY;  Surgeon: Mansouraty, Telford Nab., MD;  Location: [place];  Service: Gastroenterology;  Laterality: N/A;  . ESOPHAGOGASTRODUODENOSCOPY (EGD) WITH PROPOFOL N/A 05/16/2018   Procedure: ESOPHAGOGASTRODUODENOSCOPY (EGD) WITH PROPOFOL ;  Surgeon: Rush Landmark Telford Nab., MD;  Location: [place];  Service: Gastroenterology;  Laterality: N/A;  . HOT HEMOSTASIS N/A 05/16/2018   Procedure: HOT HEMOSTASIS (ARGON PLASMA COAGULATION/BICAP);  Surgeon: Irving Copas., MD;  Location: [place];  Service: Gastroenterology;  Laterality: N/A;  . KNEE ARTHROSCOPY  09/1999   Right  . LACERATION REPAIR  09/1999   Right Hand (Dr. Fredna Dow)  . LITHOTRIPSY  1991   Dr. Elyse Jarvis  . POLYPECTOMY  05/17/2018   Procedure: POLYPECTOMY;  Surgeon: Mansouraty, Telford Nab., MD;  Location: [place];  Service: Gastroenterology;;  . Cherre Robins CUFF REPAIR  02/05   Left    Family History  Problem Relation Age of Onset  . Stroke Mother        Lived 2 years  . Heart disease Mother        CAD, Angioplasty X 2  . Hypertension Mother   . Heart disease Father        MI  . Hypertension Father   . Hypertension Sister   . Hypertension Brother   . Heart disease Brother        MI  . Hyperlipidemia Brother   . Heart disease Brother        MI    Social History   Tobacco Use  . Smoking status: Former Smoker    Packs/day: 1.00    Years: 25.00    Pack years: 25.00    Types: Cigarettes    Quit date: 01/09/1976    Years since quitting: 44.8  . Smokeless tobacco: Former Systems developer    Types: Chew  . Tobacco comment: quit smoking  about 25 years ago  Vaping Use  . Vaping Use: Never used  Substance Use Topics  . Alcohol use: No  . Drug use: No    ROS   Objective:   Vitals: BP (!) 153/86 (BP Location: Left Arm)   Pulse 81   Temp (!) 97.5 F (36.4 C) (Oral)   Resp 18   SpO2 95%   Physical Exam  Constitutional:      General: He is not in acute distress.    Appearance: Normal appearance. He is well-developed and normal weight. He is not ill-appearing, toxic-appearing or diaphoretic.  HENT:     Head: Normocephalic and atraumatic.     Right Ear: External ear normal.     Left Ear: External ear normal.     Nose: Nose normal.     Mouth/Throat:     Pharynx: Oropharynx is clear.  Eyes:     General: No scleral icterus.       Right eye: No discharge.        Left eye: No discharge.     Extraocular Movements: Extraocular movements intact.     Pupils: Pupils are equal, round, and reactive to light.  Cardiovascular:     Rate and Rhythm: Normal rate.  Pulmonary:     Effort: Pulmonary effort is normal.  Musculoskeletal:     Cervical back: Normal range of motion.  Neurological:     Mental Status: He is alert and oriented to person, place, and time.  Psychiatric:        Mood and Affect: Mood normal.        Behavior: Behavior normal.        Thought Content: Thought content normal.        Judgment: Judgment normal.     DG Foot Complete Right  Result Date: 10/19/2020 CLINICAL DATA:  Right foot pain, wound medial aspect of 1st MTP joint. Diabetic. EXAM: RIGHT FOOT COMPLETE - 3+ VIEW COMPARISON:  10/03/2020 FINDINGS: Postoperative changes are seen in the 1st metatarsal. Mild degenerative changes at the 1st MTP joint. No acute bony abnormality. Specifically, no fracture, subluxation, or dislocation. No bone destruction to suggest osteomyelitis. Vascular calcifications noted. IMPRESSION: No acute bony abnormality. Electronically Signed   By: Rolm Baptise M.D.   On: 10/19/2020 18:54     Assessment and Plan :    PDMP not reviewed this encounter.  1. Right foot pain   2. Localized swelling of right foot     Recommend use of Tylenol.  Patient will need a referral to podiatry for further work-up and management, diabetic foot care. Counseled patient on potential for adverse effects with medications prescribed/recommended today, ER and return-to-clinic precautions discussed, patient verbalized understanding.    Jaynee Eagles, [place] 10/19/20 1858

## 2020-10-19 NOTE — Telephone Encounter (Signed)
Pt's son [name] left VM at Triage. Lynden Oxford pt's foot on 10/03/20, son said it never improved and abx didn't help and now his toes are red, and his whole foot is swollen and his son is sure it's infected and didn't know what to do. I called son back and no answer so left VM requesting son to call call the office back. Also called other #s on file and no answer on any.  Will route to PCP and Anda Kraft, who eval pt

## 2020-10-20 NOTE — Telephone Encounter (Signed)
[place] [facility] - Client TELEPHONE ADVICE RECORD AccessNurse Patient Name: Paul Terrell Gender: Male DOB: 1931-06-29 Age: 87 Y 65 M 15 D Return Phone Number: [phone] (Primary), [phone] (Secondary) Address[place] [address] Client [place] [facility] - Client Client Site [place] Physician Renford Dills - MD Contact Type Call Who Is Calling Patient / Member / Family / Caregiver Call Type Triage / Clinical Caller Name Paul Terrell Relationship To Patient Son Return Phone Number [phone] (Primary) Chief Complaint Feet swelling Reason for Call Symptomatic / Request for [place] states the pt's foot is swollen and looks infected . He was seen and given antibiotics on 10/03/2020. They did not work and his foot is red and toes and foot are swollen [facility] Translation No Nurse Assessment Nurse: Velta Addison, RN, [place] Date/Time (Eastern Time): 10/19/2020 5:02:31 PM Confirm and document reason for call. If symptomatic, describe symptoms. ---Caller states the pt's foot is swollen and looks infected . He was seen and given antibiotics on 10/03/2020. They did not work and his foot is red and toes and foot are swollen. Caller states the sore on the side of his foot is larger. Denies drainage at this time. Does the patient have any new or worsening symptoms? ---Yes Will a triage be completed? ---Yes Related visit to physician within the last 2 weeks? ---Yes Does the PT have any chronic conditions? (i.e. diabetes, asthma, this includes High risk factors for pregnancy, etc.) ---Yes List chronic conditions. ---DM, HTN, GERD Is this a behavioral health or substance abuse call? ---No Guidelines Guideline Title Affirmed Question Affirmed Notes Nurse Date/Time (Eastern Time) Diabetes - Foot Problems and  Questions Patient sounds very sick or weak to the triager Velta Addison, RN, [place] 10/19/2020 5:04:59 PM Disp. Time Eilene Ghazi Time) Disposition Final User 10/19/2020 5:10:51 PM Go to ED Now (or PCP triage) Yes Par[place], RN, Crystal PLEASE NOTE: All timestamps contained within this report are represented as [place] Standard Time. CONFIDENTIALTY NOTICE: This fax transmission is intended only for the addressee. It contains information that is legally privileged, confidential or otherwise protected from use or disclosure. If you are not the intended recipient, you are strictly prohibited from reviewing, disclosing, copying using or disseminating any of this information or taking any action in reliance on or regarding this information. If you have received this fax in error, please notify us immediately by telephone so that we can arrange for its return to [place]. Phone: [phone], Toll-Free: [phone], Fax: [phone] Page: 2 of 2 Call Id: [id number] [place] Disagree/Comply Comply Caller Understands Yes PreDisposition Call Doctor Care Advice Given Per Guideline GO TO ED NOW (OR PCP TRIAGE): CARE ADVICE given per Diabetes - Foot Problems and Questions (Adult) guideline. * IF NO PCP (PRIMARY CARE PROVIDER) SECOND-LEVEL TRIAGE: You need to be seen within the next hour. Go to the ED/ UCC at _____________ [place] as soon as you can. BRING MEDICINES: ANOTHER ADULT SHOULD DRIVE: Referrals GO TO FACILITY OTHER - SPECIFY

## 2020-10-20 NOTE — Telephone Encounter (Signed)
Seen in urgent care and podiatry referral was placed.  I will await podiatry note.  Thanks.

## 2020-10-20 NOTE — Telephone Encounter (Signed)
Per chart review tab pt seen at [facility] on 10/19/20. And below this access nurse note Dr [name] is already aware and has made a notation.

## 2020-10-22 NOTE — Telephone Encounter (Signed)
Noted  

## 2020-10-24 ENCOUNTER — Telehealth: Payer: Self-pay | Admitting: Family Medicine

## 2020-10-24 NOTE — Telephone Encounter (Signed)
Called to reschedule his CPE due Dr. [name] will not be here

## 2020-10-26 ENCOUNTER — Telehealth: Payer: Self-pay | Admitting: Cardiovascular Disease

## 2020-10-26 MED ORDER — HYDROCHLOROTHIAZIDE 12.5 MG PO CAPS: 12.5000 mg | ORAL_CAPSULE | Freq: Every day | ORAL | 2 refills | Status: DC

## 2020-10-26 NOTE — Telephone Encounter (Signed)
*  STAT* If patient is at the pharmacy, call can be transferred to refill team.   1. Which medications need to be refilled? (please list name of each medication and dose if known)  hydrochlorothiazide (MICROZIDE) 12.5 MG capsule   2. Which pharmacy/location (including street and city if local pharmacy) is medication to be sent to? [id number] [place] [place] - [address]. Do they need a 30 day or 90 day supply? 90  Patient is almost out of this medication. Please advise.

## 2020-10-26 NOTE — Telephone Encounter (Signed)
LVM letting patient know that a refill for his Hydrochlorothiazide has [facility] [address] [facility].

## 2020-10-27 ENCOUNTER — Other Ambulatory Visit: Payer: Medicare Other

## 2020-11-03 ENCOUNTER — Other Ambulatory Visit (INDEPENDENT_AMBULATORY_CARE_PROVIDER_SITE_OTHER): Payer: Medicare Other

## 2020-11-03 ENCOUNTER — Other Ambulatory Visit: Payer: Self-pay

## 2020-11-03 ENCOUNTER — Ambulatory Visit (INDEPENDENT_AMBULATORY_CARE_PROVIDER_SITE_OTHER): Payer: Medicare Other | Admitting: Podiatry

## 2020-11-03 ENCOUNTER — Encounter: Payer: Medicare Other | Admitting: Family Medicine

## 2020-11-03 DIAGNOSIS — M79675 Pain in left toe(s): Secondary | ICD-10-CM

## 2020-11-03 DIAGNOSIS — D649 Anemia, unspecified: Secondary | ICD-10-CM

## 2020-11-03 DIAGNOSIS — S90821A Blister (nonthermal), right foot, initial encounter: Secondary | ICD-10-CM | POA: Diagnosis not present

## 2020-11-03 DIAGNOSIS — B351 Tinea unguium: Secondary | ICD-10-CM | POA: Diagnosis not present

## 2020-11-03 DIAGNOSIS — M79674 Pain in right toe(s): Secondary | ICD-10-CM

## 2020-11-03 DIAGNOSIS — E119 Type 2 diabetes mellitus without complications: Secondary | ICD-10-CM | POA: Diagnosis not present

## 2020-11-03 LAB — CBC WITH DIFFERENTIAL/PLATELET
Basophils Absolute: 0 10*3/uL (ref 0.0–0.1)
Basophils Relative: 0.6 % (ref 0.0–3.0)
Eosinophils Absolute: 0.2 10*3/uL (ref 0.0–0.7)
Eosinophils Relative: 2.3 % (ref 0.0–5.0)
HCT: 42.4 % (ref 39.0–52.0)
Hemoglobin: 14.8 g/dL (ref 13.0–17.0)
Lymphocytes Relative: 36.6 % (ref 12.0–46.0)
Lymphs Abs: 2.6 10*3/uL (ref 0.7–4.0)
MCHC: 35 g/dL (ref 30.0–36.0)
MCV: 96 fl (ref 78.0–100.0)
Monocytes Absolute: 0.6 10*3/uL (ref 0.1–1.0)
Monocytes Relative: 8.5 % (ref 3.0–12.0)
Neutro Abs: 3.7 10*3/uL (ref 1.4–7.7)
Neutrophils Relative %: 52 % (ref 43.0–77.0)
Platelets: 163 10*3/uL (ref 150.0–400.0)
RBC: 4.42 Mil/uL (ref 4.22–5.81)
RDW: 14.1 % (ref 11.5–15.5)
WBC: 7.1 10*3/uL (ref 4.0–10.5)

## 2020-11-03 LAB — LIPID PANEL
Cholesterol: 150 mg/dL (ref 0–200)
HDL: 37.5 mg/dL — ABNORMAL LOW (ref >39.00)
LDL Cholesterol: 81 mg/dL (ref 0–99)
NonHDL: 112.88
Total CHOL/HDL Ratio: 4
Triglycerides: 161 mg/dL — ABNORMAL HIGH (ref 0.0–149.0)
VLDL: 32.2 mg/dL (ref 0.0–40.0)

## 2020-11-03 LAB — COMPREHENSIVE METABOLIC PANEL
ALT: 39 U/L (ref 0–53)
AST: 34 U/L (ref 0–37)
Albumin: 4.2 g/dL (ref 3.5–5.2)
Alkaline Phosphatase: 72 U/L (ref 39–117)
BUN: 18 mg/dL (ref 6–23)
CO2: 30 mEq/L (ref 19–32)
Calcium: 9.5 mg/dL (ref 8.4–10.5)
Chloride: 97 mEq/L (ref 96–112)
Creatinine, Ser: 1.36 mg/dL (ref 0.40–1.50)
GFR: 46.77 mL/min — ABNORMAL LOW (ref >60.00)
Glucose, Bld: 214 mg/dL — ABNORMAL HIGH (ref 70–99)
Potassium: 5.2 mEq/L — ABNORMAL HIGH (ref 3.5–5.1)
Sodium: 134 mEq/L — ABNORMAL LOW (ref 135–145)
Total Bilirubin: 1 mg/dL (ref 0.2–1.2)
Total Protein: 7 g/dL (ref 6.0–8.3)

## 2020-11-03 LAB — IRON: Iron: 108 ug/dL (ref 42–165)

## 2020-11-06 LAB — HEMOGLOBIN A1C: Hgb A1c MFr Bld: 9.4 % — ABNORMAL HIGH (ref 4.6–6.5)

## 2020-11-07 ENCOUNTER — Encounter: Payer: Self-pay | Admitting: Podiatry

## 2020-11-07 NOTE — Progress Notes (Signed)
  Subjective:  Patient ID: Paul Terrell, male    DOB: 05-19-1931,  MRN: [id number]  Chief Complaint  Patient presents with  . Callouses    Left foot on the side of the big toe pt has a painful place that has been progressively getting worse over the last 6 weeks.   87 y.o. male returns for the above complaint.  Patient presents with thickened elongated dystrophic toenails x10.  Patient states is painful to touch.  He is not able to debride himself.  He would like for me to debride the nails down.  His last A1c was 8.1 that was done in November.  He also has secondary complaint of right blister on the big toe.  Patient states mildly painful but just wanted to get it evaluated make sure that there is nothing going on.  He is a diabetic so he was worried he does not want to lose any toes.  Objective:  There were no vitals filed for this visit. Podiatric Exam: Vascular: dorsalis pedis and posterior tibial pulses are palpable bilateral. Capillary return is immediate. Temperature gradient is WNL. Skin turgor WNL  Sensorium: Normal Semmes Weinstein monofilament test. Normal tactile sensation bilaterally. Nail Exam: Pt has thick disfigured discolored nails with subungual debris noted bilateral entire nail hallux through fifth toenails.  Pain on palpation to the nails. Ulcer Exam: There is no evidence of ulcer or pre-ulcerative changes or infection. Orthopedic Exam: Muscle tone and strength are WNL. No limitations in general ROM. No crepitus or effusions noted. HAV  B/L.  Hammer toes 2-5  B/L. Skin: No Porokeratosis. No infection or ulcers.  Superficial blister without any concern for wound noted.  No clinical signs of infection noted no purulent drainage noted.   Assessment & Plan:   1. Friction blister of the foot, right, initial encounter   2. Pain due to onychomycosis of toenails of both feet     Patient was evaluated and treated and all questions answered.  Right hallux superficial blister -I  explained to the patient the etiology of blister and various treatment options were discussed.  I did discuss shoe gear modification with him in extensive detail he states understanding will work on that.  Given that there is no clinical signs of infection it appears to be pretty dried and well adhered.  I believe patient will benefit from just triple antibiotic and a Band-Aid application.  He states understanding and he will do so.  Onychomycosis with pain  -Nails palliatively debrided as below. -Educated on self-care  Procedure: Nail Debridement Rationale: pain  Type of Debridement: manual, sharp debridement. Instrumentation: Nail nipper, rotary burr. Number of Nails: 10  Procedures and Treatment: Consent by patient was obtained for treatment procedures. The patient understood the discussion of treatment and procedures well. All questions were answered thoroughly reviewed. Debridement of mycotic and hypertrophic toenails, 1 through 5 bilateral and clearing of subungual debris. No ulceration, no infection noted.  Return Visit-Office Procedure: Patient instructed to return to the office for a follow up visit 3 months for continued evaluation and treatment.  Boneta Lucks, DPM    No follow-ups on file.

## 2020-11-10 ENCOUNTER — Encounter: Payer: Medicare Other | Admitting: Family Medicine

## 2020-11-15 ENCOUNTER — Other Ambulatory Visit: Payer: Self-pay | Admitting: *Deleted

## 2020-11-15 MED ORDER — PANTOPRAZOLE SODIUM 40 MG PO TBEC: 40.0000 mg | DELAYED_RELEASE_TABLET | Freq: Every day | ORAL | 0 refills | Status: DC

## 2020-11-15 NOTE — Telephone Encounter (Addendum)
Patient's son called stating that his dad needs a refill on his Pantoprazole.  [name] requested that the script be sent to [place] [facility] per son's request.

## 2020-11-20 DIAGNOSIS — Z85828 Personal history of other malignant neoplasm of skin: Secondary | ICD-10-CM | POA: Diagnosis not present

## 2020-11-20 DIAGNOSIS — L905 Scar conditions and fibrosis of skin: Secondary | ICD-10-CM | POA: Diagnosis not present

## 2020-11-20 DIAGNOSIS — L821 Other seborrheic keratosis: Secondary | ICD-10-CM | POA: Diagnosis not present

## 2020-11-20 DIAGNOSIS — L814 Other melanin hyperpigmentation: Secondary | ICD-10-CM | POA: Diagnosis not present

## 2020-11-20 DIAGNOSIS — D229 Melanocytic nevi, unspecified: Secondary | ICD-10-CM | POA: Diagnosis not present

## 2020-11-20 DIAGNOSIS — L309 Dermatitis, unspecified: Secondary | ICD-10-CM | POA: Diagnosis not present

## 2020-11-20 DIAGNOSIS — L570 Actinic keratosis: Secondary | ICD-10-CM | POA: Diagnosis not present

## 2020-11-21 ENCOUNTER — Ambulatory Visit (INDEPENDENT_AMBULATORY_CARE_PROVIDER_SITE_OTHER): Payer: Medicare Other | Admitting: Family Medicine

## 2020-11-21 ENCOUNTER — Encounter: Payer: Self-pay | Admitting: Family Medicine

## 2020-11-21 ENCOUNTER — Other Ambulatory Visit: Payer: Self-pay

## 2020-11-21 VITALS — BP 112/58 | HR 73 | Temp 97.4°F | Ht 68.0 in | Wt 182.0 lb

## 2020-11-21 DIAGNOSIS — Z7189 Other specified counseling: Secondary | ICD-10-CM

## 2020-11-21 DIAGNOSIS — I10 Essential (primary) hypertension: Secondary | ICD-10-CM

## 2020-11-21 DIAGNOSIS — Z0000 Encounter for general adult medical examination without abnormal findings: Secondary | ICD-10-CM | POA: Diagnosis not present

## 2020-11-21 DIAGNOSIS — D649 Anemia, unspecified: Secondary | ICD-10-CM | POA: Diagnosis not present

## 2020-11-21 DIAGNOSIS — I25118 Atherosclerotic heart disease of native coronary artery with other forms of angina pectoris: Secondary | ICD-10-CM | POA: Diagnosis not present

## 2020-11-21 DIAGNOSIS — R413 Other amnesia: Secondary | ICD-10-CM | POA: Diagnosis not present

## 2020-11-21 DIAGNOSIS — E119 Type 2 diabetes mellitus without complications: Secondary | ICD-10-CM | POA: Diagnosis not present

## 2020-11-21 DIAGNOSIS — Z9181 History of falling: Secondary | ICD-10-CM | POA: Diagnosis not present

## 2020-11-21 NOTE — Progress Notes (Signed)
This visit occurred during the SARS-CoV-2 public health emergency.  Safety protocols were in place, including screening questions prior to the visit, additional usage of staff PPE, and extensive cleaning of exam room while observing appropriate contact time as indicated for disinfecting solutions.  I have personally reviewed the Medicare Annual Wellness questionnaire and have noted 1. The patient's medical and social history 2. Their use of alcohol, tobacco or illicit drugs 3. Their current medications and supplements 4. The patient's functional ability including ADL's, fall risks, home safety risks and hearing or visual             impairment. 5. Diet and physical activities 6. Evidence for depression or mood disorders  The patients weight, height, BMI have been recorded in the chart and visual acuity is per eye clinic.  I have made referrals, counseling and provided education to the patient based review of the above and I have provided the pt with a written personalized care plan for preventive services.  Provider list updated- see scanned forms.  Routine anticipatory guidance given to patient.  See health maintenance. The possibility exists that previously documented standard health maintenance information may have been brought forward from a previous encounter into this note.  If needed, that same information has been updated to reflect the current situation based on today's encounter.    Flu up-to-date Shingles discussed with patient PNA up-to-date Tetanus up-to-date COVID vaccine up-to-date. Colon cancer screening deferred given his age. Prostate cancer screening deferred given his age. Advance directive-wife designated patient were incapacitated. Cognitive function addressed- see scanned forms- and if abnormal then additional documentation follows.   Diabetes:  Taking his meds but more snacking.  Had podiatry f/u.   A1c up, more sweets recently.  Son is helping with insulin.   Sugars been usually ~200.  No lows.    Cough noted, unclear if from ACE.  No sputum.  No fevers.  Drinking water helps.    H/o anemia.  HGB stable.  Labs discussed with patient.  He possibly has had some chest pain, unclear if with walking/exertion over the last few months.  No pain currently.  His memory is worse than it used to be and he was not consistent with details about this.  His son did not have any knowledge of chest pain prior to the office visit today.  He is clearly not having any symptoms at the visit.  He is not short of breath.  Memory changes.  Gradually noted in the last year.  He has an additional stressor with his wife's illness, she had a broken leg.  Family has noted more trouble with recall.  He was not oriented to the year this year.  He can only recall 1 out of 3 on short-term testing.  This is a change from his previous baseline.  Prev MRI discussed with patient: There is no acute infarction or intracranial hemorrhage. There is no intracranial mass, mass effect, or edema. There is no hydrocephalus or extra-axial fluid collection.  Patchy and confluent areas of T2 hyperintensity in the supratentorial and pontine white matter nonspecific but probably reflect moderate chronic microvascular ischemic changes. Prominence of the ventricles and sulci reflects generalized parenchymal volume loss. Appearance is similar to the prior study.  Walking with a cane.  He is fallen at home.  Fall cautions discussed with patient.  Given his age and his fall risk we talked about home health physical therapy.  Ordered.  PMH and SH reviewed  Meds, vitals, and  allergies reviewed.   ROS: Per HPI.  Unless specifically indicated otherwise in HPI, the patient denies:  General: fever. Eyes: acute vision changes ENT: sore throat Cardiovascular: chest pain Respiratory: SOB GI: vomiting GU: dysuria Musculoskeletal: acute back pain Derm: acute rash Neuro: acute motor  dysfunction Psych: worsening mood Endocrine: polydipsia Heme: bleeding Allergy: hayfever  GEN: nad, alert but not oriented HEENT: mucous membranes moist NECK: supple w/o LA CV: rrr. PULM: ctab, no inc wob ABD: soft, +bs EXT: no edema SKIN: no acute rash

## 2020-11-21 NOTE — Patient Instructions (Addendum)
Plan on recheck in about 2 weeks.    Cut back on the cookies.  Stop the lisinopril and see if the cough gets better in the next 2 weeks.   Add 1 unit of insulin daily until AM sugar is abound 150.    If you have more chest discomfort then let me know.

## 2020-11-22 DIAGNOSIS — R413 Other amnesia: Secondary | ICD-10-CM | POA: Insufficient documentation

## 2020-11-22 DIAGNOSIS — Z9181 History of falling: Secondary | ICD-10-CM | POA: Insufficient documentation

## 2020-11-22 NOTE — Assessment & Plan Note (Addendum)
I will update cardiology.  The patient does not give a specific history that is consistent.  He does have memory loss.  The son was not aware of the patient's complaint until today.  I think it makes sense to observe for now, given his other medical issues and especially since he is not having chest pain or shortness of breath currently.

## 2020-11-22 NOTE — Assessment & Plan Note (Signed)
We agreed to make only a few changes at this point, with stopping lisinopril and increasing his insulin.  I want to recheck his memory in about 2 weeks.  Son agrees.  He will come back for relatively short-term follow-up.  See above.

## 2020-11-22 NOTE — Assessment & Plan Note (Signed)
History of.  Fortunately hemoglobin is stable.  Continue iron as is for now.  We can recheck periodically.  He agrees to plan.

## 2020-11-22 NOTE — Assessment & Plan Note (Signed)
With history of fall at home.  Refer to PT.

## 2020-11-22 NOTE — Assessment & Plan Note (Signed)
Continue insulin along with glyburide but increase by 1 unit/day on insulin until a.m. sugars around 150.  Recheck in about 2 weeks.  Discussed with patient's son.  Son can help patient with insulin.

## 2020-11-22 NOTE — Assessment & Plan Note (Signed)
Flu up-to-date Shingles discussed with patient PNA up-to-date Tetanus up-to-date COVID vaccine up-to-date. Colon cancer screening deferred given his age. Prostate cancer screening deferred given his age. Advance directive-wife designated patient were incapacitated. Cognitive function addressed- see scanned forms- and if abnormal then additional documentation follows.

## 2020-11-22 NOTE — Assessment & Plan Note (Signed)
I do not suspect that hypotension is the only issue contributing to his memory but his blood pressure is relatively low.  He also may have a cough related to ACE inhibitor use.  Stop lisinopril for now and recheck in 2 weeks.  Rationale discussed with patient.  He agrees.

## 2020-11-22 NOTE — Assessment & Plan Note (Signed)
Advance directive-wife designated patient were incapacitated. 

## 2020-11-23 NOTE — Progress Notes (Signed)
Thanks for follow up. Glad to see his BP is much better. Unless the chest pain complaints become more consistent, I would not pursue at this time. He is not a great candidate for ischemic evaluation or treatment.

## 2020-11-25 ENCOUNTER — Telehealth: Payer: Self-pay | Admitting: Family Medicine

## 2020-11-25 NOTE — Telephone Encounter (Signed)
Editor: Sanda Klein, MD (Physician)    _  Thanks for follow up. Glad to see his BP is much better. Unless the chest pain complaints become more consistent, I would not pursue at this time. He is not a great candidate for ischemic evaluation or treatment    ==================  Can discuss with patient at follow-up.  I appreciate the help of all involved.

## 2020-11-30 ENCOUNTER — Telehealth: Payer: Self-pay

## 2020-11-30 NOTE — Chronic Care Management (AMB) (Signed)
Chronic Care Management Pharmacy Assistant   Name: Paul Terrell  MRN: [id number] DOB: May 11, 1931  Reason for Encounter: Initial Questions for 12/04/20 appointment   Conditions to be addressed/monitored: HTN, HLD and DMII   Hospital visits:  None in previous 6 months  Medications: Outpatient Encounter Medications as of 11/30/2020  Medication Sig   acetaminophen (TYLENOL) 500 MG tablet Take 500 mg by mouth every 6 (six) hours as needed (for pain or headaches).   aspirin 81 MG tablet Take 81 mg by mouth at bedtime.   blood glucose meter kit and supplies Dispense Accu-chek meter. Check sugar once daily. DX E11.9   Cholecalciferol (VITAMIN D-3) 5000 UNITS TABS Take 5,000 Units by mouth daily.    ferrous sulfate 325 (65 FE) MG tablet Take 1 tablet (325 mg total) by mouth daily with breakfast.   finasteride (PROSCAR) 5 MG tablet TAKE 1 TABLET (5 MG TOTAL) BY MOUTH DAILY.   glucose blood test strip Use up to 3 times a day.  Insulin treated DM2.  For Verio meter   glyBURIDE (DIABETA) 5 MG tablet TAKE 2 TABLETS (10 MG TOTAL) DAILY WITH BREAKFAST.   hydrochlorothiazide (MICROZIDE) 12.5 MG capsule Take 1 capsule (12.5 mg total) by mouth daily.   insulin glargine (LANTUS SOLOSTAR) 100 UNIT/ML Solostar Pen Inject 30 Units into the skin daily. Dx E11.9   Insulin Pen Needle (PEN NEEDLES) 31G X 5 MM MISC Use daily with insulin pen.   Lancets (ONETOUCH ULTRASOFT) lancets Test once daily Dx 250.00   Melatonin 5 MG TABS Take 5 mg by mouth at bedtime as needed.   metoprolol succinate (TOPROL-XL) 50 MG 24 hr tablet TAKE 1 TABLET EVERY DAY   mirtazapine (REMERON) 7.5 MG tablet Take 1 tablet (7.5 mg total) by mouth at bedtime.   Multiple Vitamin (MULTIVITAMIN) tablet Take 1 tablet by mouth daily.   pantoprazole (PROTONIX) 40 MG tablet Take 1 tablet (40 mg total) by mouth daily.   pravastatin (PRAVACHOL) 20 MG tablet TAKE 1 TABLET AT BEDTIME   tamsulosin (FLOMAX) 0.4 MG CAPS capsule  TAKE 1 CAPSULE (0.4 MG TOTAL) BY MOUTH AT BEDTIME.   vitamin B-12 (CYANOCOBALAMIN) 250 MCG tablet Take 250 mcg by mouth 2 (two) times daily.   No facility-administered encounter medications on file as of 11/30/2020.     Lab Results  Component Value Date/Time   HGBA1C 9.4 Repeated and verified X2. (H) 11/03/2020 08:30 AM   HGBA1C 8.1 (H) 07/24/2020 02:44 PM   MICROALBUR 6.4 (H) 11/06/2017 08:50 AM   MICROALBUR 6.2 (H) 08/05/2017 11:40 AM     BP Readings from Last 3 Encounters:  11/21/20 (!) 112/58  10/19/20 (!) 153/86  10/03/20 140/68    Number listed is son, Paul Terrell. He will be the contact for the appointment. States he will be at his fathers house for the appointment.    Have you seen any other providers since your last visit with PCP? No   Any changes in your medications or health? Yes o Dr. Damita Terrell discontinued lisinopril and increased his insulin by 1 unit/day on insulin until a.m. sugars around 150   Any side effects from any medications? Yes- Dr. Damita Terrell is holding lisinopril due to cough   Do you have an symptoms or problems not managed by your medications? No   Any concerns about your health right now? No   Has your provider asked that you check blood pressure, blood sugar, or follow special diet at home? Yes- checks blood sugar  Do you get any type of exercise on a regular basis? No    Can you think of a goal you would like to reach for your health? No   Do you have any problems getting your medications? No o Patient's [facility]  [id number] [place] [id number]: [phone]: [phone]  Is there anything that you would like to discuss during the appointment? No   Patient's son was reminded to have all Mr. GSainemedications, supplements and any blood glucose and blood pressure readings available for review with MDebbora Terrell  Pharm. D, at his telephone visit on 12/04/20 at 8:30 .    Star Rating Drugs:  Medication:  Last Fill: Day Supply Glyburide 5 mg 07/25/20 90  Pravastatin 20 mg 07/17/20 90  Follow-Up:  Pharmacist Review  MDebbora Terrell CPP notified  LMargaretmary Terrell CEast [phone] Total time spent for month: 30

## 2020-12-04 ENCOUNTER — Ambulatory Visit (INDEPENDENT_AMBULATORY_CARE_PROVIDER_SITE_OTHER): Payer: Medicare Other

## 2020-12-04 ENCOUNTER — Other Ambulatory Visit: Payer: Self-pay

## 2020-12-04 DIAGNOSIS — E782 Mixed hyperlipidemia: Secondary | ICD-10-CM

## 2020-12-04 DIAGNOSIS — E119 Type 2 diabetes mellitus without complications: Secondary | ICD-10-CM | POA: Diagnosis not present

## 2020-12-04 DIAGNOSIS — I119 Hypertensive heart disease without heart failure: Secondary | ICD-10-CM | POA: Diagnosis not present

## 2020-12-04 NOTE — Progress Notes (Addendum)
Chronic Care Management Pharmacy Note  12/13/2020 Name:  Paul Terrell MRN:  [id number] DOB:  May 07, 1931  Subjective: Paul Terrell is an 87 y.o. year old male who is a primary patient of Paul Terrell, Paul Rising, MD.  The CCM team was consulted for assistance with disease management and care coordination needs.    Engaged with patient by telephone for initial visit in response to provider referral for pharmacy case management and/or care coordination services. Spoke with Paul Terrell, patient's son for medication review.  CCM Consent 10/18/20  Consent to Services:  The patient was given the following information about Chronic Care Management services today, agreed to services, and gave verbal consent: 1. CCM service includes personalized support from designated clinical staff supervised by the primary care provider, including individualized plan of care and coordination with other care providers 2. 24/7 contact phone numbers for assistance for urgent and routine care needs. 3. Service will only be billed when office clinical staff spend 20 minutes or more in a month to coordinate care. 4. Only one practitioner may furnish and bill the service in a calendar month. 5.The patient may stop CCM services at any time (effective at the end of the month) by phone call to the office staff. 6. The patient will be responsible for cost sharing (co-pay) of up to 20% of the service fee (after annual deductible is met). Patient agreed to services and consent obtained.  Patient Care Team: Tonia Ghent, MD as PCP - General (Family Medicine) Jacolyn Reedy, MD as Consulting Physician (Cardiology) Luberta Mutter, MD as Consulting Physician (Ophthalmology) Druscilla Brownie, MD as Consulting Physician (Dermatology) Debbora Dus, [facility] as Pharmacist (Pharmacist)  Recent office visits: 11/21/20 - Hold lisinopril due to cough/low BP. Increase insulin by 1 unit/day until AM sugar < 150 07/24/20 - Anemia, Continue iron, labs  today.  Recent consult visits: 11/03/20 [facility] visits: 10/19/20 - Urgent care for right foot pain  Objective:  Lab Results  Component Value Date   CREATININE 1.36 11/03/2020   BUN 18 11/03/2020   GFR 46.77 (L) 11/03/2020   GFRNONAA >60 01/17/2020   GFRAA >60 01/17/2020   NA 134 (L) 11/03/2020   K 5.2 No hemolysis seen (H) 11/03/2020   CALCIUM 9.5 11/03/2020   CO2 30 11/03/2020    Lab Results  Component Value Date/Time   HGBA1C 9.4 Repeated and verified X2. (H) 11/03/2020 08:30 AM   HGBA1C 8.1 (H) 07/24/2020 02:44 PM   GFR 46.77 (L) 11/03/2020 08:30 AM   GFR 46.38 (L) 10/03/2020 09:25 AM   MICROALBUR 6.4 (H) 11/06/2017 08:50 AM   MICROALBUR 6.2 (H) 08/05/2017 11:40 AM    Last diabetic Eye exam:  Lab Results  Component Value Date/Time   HMDIABEYEEXA No Retinopathy 08/15/2020 04:36 PM    Last diabetic Foot exam:  04/21/20 PCP   Lab Results  Component Value Date   CHOL 150 11/03/2020   HDL 37.50 (L) 11/03/2020   LDLCALC 81 11/03/2020   TRIG 161.0 (H) 11/03/2020   CHOLHDL 4 11/03/2020    Hepatic Function Latest Ref Rng & Units 11/03/2020 12/14/2019 10/07/2019  Total Protein 6.0 - 8.3 g/dL 7.0 6.3 5.8(L)  Albumin 3.5 - 5.2 g/dL 4.2 4.1 3.5  AST 0 - 37 U/L 34 23 21  ALT 0 - 53 U/L 39 22 19  Alk Phosphatase 39 - 117 U/L 72 58 65  Total Bilirubin 0.2 - 1.2 mg/dL 1.0 0.6 0.4  Bilirubin, Direct 0.0 - 0.2  mg/dL - - -    Lab Results  Component Value Date/Time   TSH 0.966 08/15/2019 08:44 PM   TSH 1.81 01/07/2011 08:35 AM   TSH 1.63 12/27/2009 08:57 AM    CBC Latest Ref Rng & Units 11/03/2020 10/03/2020 07/24/2020  WBC 4.0 - 10.5 K/uL 7.1 6.8 7.3  Hemoglobin 13.0 - 17.0 g/dL 14.8 14.4 13.7  Hematocrit 39.0 - 52.0 % 42.4 41.7 39.5  Platelets 150.0 - 400.0 K/uL 163.0 155.0 181.0    Lab Results  Component Value Date/Time   VD25OH 63.60 06/01/2020 01:43 PM   VD25OH 37 12/27/2009 10:17 PM    Clinical ASCVD: Yes  The ASCVD Risk score Mikey Bussing DC Jr., et  al., 2013) failed to calculate for the following reasons:   The 2013 ASCVD risk score is only valid for ages 48 to 10    Depression screen PHQ 2/9 12/21/2019 10/07/2019 08/17/2018  Decreased Interest 0 0 0  Down, Depressed, Hopeless 0 1 0  PHQ - 2 Score 0 1 0  Altered sleeping - - -  Tired, decreased energy - - -  Change in appetite - - -  Feeling bad or failure about yourself  - - -  Trouble concentrating - - -  Moving slowly or fidgety/restless - - -  Suicidal thoughts - - -  PHQ-9 Score - - -  Difficult doing work/chores - Not difficult at all -  Some recent data might be hidden     Social History   Tobacco Use  Smoking Status Former Smoker  . Packs/day: 1.00  . Years: 25.00  . Pack years: 25.00  . Types: Cigarettes  . Quit date: 01/09/1976  . Years since quitting: 44.9  Smokeless Tobacco Former Systems developer  . Types: Chew  Tobacco Comment   quit smoking about 25 years ago   BP Readings from Last 3 Encounters:  12/08/20 134/70  11/21/20 (!) 112/58  10/19/20 (!) 153/86   Pulse Readings from Last 3 Encounters:  12/08/20 67  11/21/20 73  10/19/20 81   Wt Readings from Last 3 Encounters:  12/08/20 181 lb (82.1 kg)  11/21/20 182 lb (82.6 kg)  10/03/20 184 lb (83.5 kg)    Assessment/Interventions: Review of patient past medical history, allergies, medications, health status, including review of consultants reports, laboratory and other test data, was performed as part of comprehensive evaluation and provision of chronic care management services.   SDOH:  (Social Determinants of Health) assessments and interventions performed: Yes SDOH Interventions   Flowsheet Row Most Recent Value  SDOH Interventions   Financial Strain Interventions Intervention Not Indicated  [Medications affordable]      CCM Care Plan  Allergies  Allergen Reactions  . Nsaids Other (See Comments)    H/o anemia  . Citrus Hives  . Lisinopril     Presumed cause of cough  . Metformin And Related      GI upset.    . Zocor [Simvastatin] Other (See Comments)    Myalgia     Medications Reviewed Today    Reviewed by Debbora Dus, [facility] (Pharmacist) on 12/13/20 at 1333  Med List Status: <None>  Medication Order Taking? Sig Documenting Provider Last Dose Status Informant  acetaminophen (TYLENOL) 500 MG tablet [id number] Yes Take 500 mg by mouth every 6 (six) hours as needed (for pain or headaches). [provider] Taking Active Family Member  aspirin 81 MG tablet [id number] Yes Take 81 mg by mouth at bedtime. [provider] Taking Active Family Member  blood glucose meter kit and supplies [id number] Yes Dispense Accu-chek meter. Check sugar once daily. DX E11.9 Tonia Ghent, MD Taking Active Family Member  Cholecalciferol (VITAMIN D-3) 5000 UNITS TABS [id number] Yes Take 5,000 Units by mouth daily. breakfast [provider] Taking Active Family Member  ferrous sulfate 325 (65 FE) MG tablet [id number] Yes Take 1 tablet (325 mg total) by mouth daily with breakfast. Tonia Ghent, MD Taking Active Family Member  finasteride (PROSCAR) 5 MG tablet [id number] Yes TAKE 1 TABLET (5 MG TOTAL) BY MOUTH DAILY. Tonia Ghent, MD Taking Active   glucose blood test strip [id number] Yes Use up to 3 times a day.  Insulin treated DM2.  For Verio meter Tonia Ghent, MD Taking Active   glyBURIDE (DIABETA) 5 MG tablet [id number] Yes TAKE 2 TABLETS (10 MG TOTAL) DAILY WITH BREAKFAST. Tonia Ghent, MD Taking Active   hydrochlorothiazide (MICROZIDE) 12.5 MG capsule [id number] Yes Take 1 capsule (12.5 mg total) by mouth daily. Croitoru, Mihai, MD Taking Active Self  insulin glargine (LANTUS SOLOSTAR) 100 UNIT/ML Solostar Pen [id number] Yes Inject 40 Units into the skin daily. Dx E11.9 Tonia Ghent, MD Taking Active   Insulin Pen Needle (PEN NEEDLES) 31G X 5 MM MISC [id number] Yes Use daily with insulin pen. Tonia Ghent, MD Taking Active   Lancets [facility] ULTRASOFT) lancets  [id number] Yes Test once daily Dx 250.00 [provider] Taking Active Family Member  Melatonin 5 MG TABS [id number] Yes Take 5 mg by mouth at bedtime as needed. [provider] Taking Active Family Member  metoprolol succinate (TOPROL-XL) 50 MG 24 hr tablet [id number] Yes Take 1 tablet (50 mg total) by mouth daily. Take with or immediately following a meal. Tonia Ghent, MD Taking Active   mirtazapine (REMERON) 7.5 MG tablet [id number] Yes Take 1 tablet (7.5 mg total) by mouth at bedtime. Tonia Ghent, MD Taking Active   Multiple Vitamin (MULTIVITAMIN) tablet [id number] Yes Take 1 tablet by mouth daily. breakfast [provider] Taking Active Family Member  pantoprazole (PROTONIX) 40 MG tablet [id number] Yes Take 1 tablet (40 mg total) by mouth daily. Tonia Ghent, MD Taking Active Family Member  pravastatin (PRAVACHOL) 20 MG tablet [id number] Yes TAKE 1 TABLET AT BEDTIME Tonia Ghent, MD Taking Active   tamsulosin [facility]) 0.4 MG CAPS capsule [id number] Yes Take 1 capsule (0.4 mg total) by mouth at bedtime. Tonia Ghent, MD Taking Active   vitamin B-12 (CYANOCOBALAMIN) 250 MCG tablet [id number] Yes Take 250 mcg by mouth 2 (two) times daily. 1 in the morning and 1 at bedtime [provider] Taking Active Family Member          Patient Active Problem List   Diagnosis Date Noted  . Risk for falls 11/22/2020  . Memory change 11/22/2020  . Compression fracture of L1 lumbar vertebra ([place]) 05/08/2020  . Lower back pain 04/23/2020  . Aortic aneurysm ([place]) 10/20/2019  . GERD (gastroesophageal reflux disease)   . Thrombocytopenia ([place])   . Anemia 05/14/2018  . Hypertension 05/14/2018  . Unsteadiness on feet 11/05/2017  . Spinal stenosis of lumbar region 11/08/2015  . Advance care planning 11/04/2014  . Leg pain 01/30/2014  . Medicare annual wellness visit, subsequent 11/01/2013  . BPH (benign prostatic hyperplasia)   . Diabetes mellitus, type II  ([place])   . Hyperlipidemia   . Hypertensive heart disease   . CAD (coronary artery disease)     Immunization History  Administered  Date(s) Administered  . Fluad Quad(high Dose 65+) 05/28/2019  . Influenza Whole 07/02/2006, 07/31/2007, 06/28/2008, 06/22/2009  . Influenza,inj,Quad PF,6+ Mos 06/09/2015, 07/04/2016, 05/28/2018  . Influenza-Unspecified 07/19/2014, 07/07/2017, 07/22/2019, 06/23/2020  . PFIZER(Purple Top)SARS-COV-2 Vaccination 10/25/2019, 11/21/2019, 07/25/2020  . Pneumococcal Conjugate-13 06/09/2015  . Pneumococcal Polysaccharide-23 08/13/1999  . Td 08/02/2004  . Tdap 11/22/2014    Conditions to be addressed/monitored:  Hypertension, Hyperlipidemia, Diabetes, BPH and Insomnia  Care Plan : [place]  Updates made by Debbora Dus, [place] since 12/13/2020 12:00 AM    Problem: CHL AMB "PATIENT-SPECIFIC PROBLEM"     Long-Range Goal: Disease Management   Start Date: 12/04/2020  Priority: High  Note:   Current Barriers:  . Unable to achieve control of diabetes   . Medications not synchronized  Pharmacist Clinical Goal(s):  Marland Kitchen Over the next 90 days, patient will contact provider office for questions/concerns as evidenced notation of same in electronic health record through collaboration with PharmD and provider.   Interventions: . 1:1 collaboration with Tonia Ghent, MD regarding development and update of comprehensive plan of care as evidenced by provider attestation and co-signature . Inter-disciplinary care team collaboration (see longitudinal plan of care) . Comprehensive medication review performed; medication list updated in electronic medical record  Hypertension (BP goal < 150/90) -Not ideally controlled - consider BP goal <150/90 due to comorbidity/fall risk -Current treatment: . Hydrochlorothiazide 12.5 mg - 1 capsule daily at breakfast . Metoprolol succinate 50 mg - 1 tablet daily at breakfast -Medications previously tried: Lisinopril - on  HOLD due to cough -Current home readings:  148/73, 70  (today during call, arm cuff) -Current dietary habits: sitter cooks breakfast and lunch, son brings dinner; denies any sugary drinks; snacks on cookies, friends bring pies and treats by often  -Current exercise habits: minimal -Denies hypotensive/hypertensive symptoms -Denies any recent falls or dizziness. Reports history of falls. Walks with a cane. Does not drive. -Educated on Symptoms of hypotension and importance of maintaining adequate hydration; -Counseled to monitor BP at home a few days this week and bring to PCP visit -Recommended to continue current medication; Bring BP log to PCP visit.  Hyperlipidemia: (LDL goal < 70, CAD) -Not ideally controlled, on max tolerated statin - LDL 81 -Current treatment: . Pravastatin 20 mg - 1 tablet daily at bedtime  . Aspirin 81 mg - 1 tablet daily at bedtime  -Medications previously tried: Zocor - myalgia -Prefer to avoid additional treament considering age/limited benefit -Recommended to continue current medication  Diabetes (A1c goal <8%) -Uncontrolled - A1c 9.4% -Current medications: . Lantus - Inject 36 units at bedtime (titrating by 1 unit/day until fasting BG < 150 ) . Glyburide 5 mg - 2 tablets daily with breakfast -Medications previously tried: metformin (GI upset) -Current home glucose readings - checks every morning . fasting glucose: 163, 158, 128, 202, 173 . post prandial glucose: none . Denies any hypoglyemia  -Denies hypoglycemic/hyperglycemic symptoms -Educated on Prevention and management of hypoglycemic episodes; -Counseled to check feet daily and get yearly eye exams -Recommended to continue current medication; Continue to titrate Lantus by 1 unit/day until fasting BG < 150 per PCP instruction   Insomnia (Goal: Improve sleep) -Controlled  -Denies concerns today -Current treatment: . Mirtazapine 7.5 mg - 1 tablet daily at bedtime  . Melatonin 5 mg - 1 tablet  daily PRN -Medications previously tried/failed: none reported -BMI 27, weight is stable. Paul Terrell reports good appetite.  -Continue to monitor for risk/benefits. -Recommended to continue current medication  BPH (Goal: Control symptoms) -Controlled  -Denies concerns today -Current treatment  . Finasteride 5 mg - 1 tablet daily . Tamsulosin 0.4 mg - 1 capsule daily -Medications previously tried:none reported -Recommended to continue current medication  Patient Goals/Self-Care Activities . Over the next 90 days, patient will:  - take medications as prescribed check glucose daily, document, and provide at future appointments  -continue to titrate Lantus by 1 unit/day until fasting BG < 150 -check BP a few days this week and bring to PCP appointment   Follow Up Plan: Next PCP appointment scheduled for:  December 08, 2020   Medication Assistance: None required.  Patient affirms current coverage meets needs.     Patient's [facility] pharmacy [id number] [id number]: [phone]: [phone] Uses pill box? Yes - son and daughter help [facility] and fill pillbox weekly Son endorses 100% compliance  Star Rating Drugs: Medication:                Last Fill:         Day Supply Glyburide 5 mg           07/25/20            90        (PAST DUE) Pravastatin 20 mg       07/17/20          90 (PAST DUE)  We discussed: Benefits of medication synchronization, packaging and delivery as well as enhanced pharmacist oversight with [facility]. Patient decided to: Utilize [facility] for medication synchronization, packaging and delivery CGerald Stabswould like adherence packs. A 30 DS will be given for first delivery and then 90 DS going forward. They would like OTC items in adherence packaging. Inventory of current medications was reviewed to develop a sync plan.   Care  Plan and Follow Up Patient Decision:  Patient agrees to Care Plan and Follow-up.  Plan: The care management team will reach out to the patient again over the next 30 days.  MDebbora Dus PharmD Clinical Pharmacist [phone] Encounter details: CCM Time Spent      Value Time User   Time spent with patient (minutes)  120 12/13/2020  1:44 PM ADebbora Dus [facility]  Time spent performing Chart review  30 12/13/2020  1:44 PM [facility]  Total time (minutes)  150 12/13/2020  1:44 PM ADebbora Dus RPH     Moderate to High Complex Decision Making      Value Time User   Moderate to High complex decision making  Yes 12/13/2020  1:44 PM [facility]us [facility]    CCM Services: This encounter meets complex CCM services and moderate to high decision making.  Prior to outreach and patient consent for Chronic Care Management, I referred this patient for services after reviewing the nominated patient list or from a personal encounter with the patient.  I have personally reviewed this encounter including the documentation in this note and have collaborated with the care management provider regarding care management and care coordination activities to include development and update of the comprehensive care plan. I am certifying that I agree with the content of this note and encounter as supervising physician.  GElsie Stain

## 2020-12-06 NOTE — Chronic Care Management (AMB) (Signed)
Following appointment with Debbora Dus on 12/04/20 patient requested to onboard with [facility]. Medication count was completed at initial visit and onboarding form was completed. [facility]'s [facility] for delivery, med sync, and adherence packaging from [facility].  Follow-Up:  Coordination of Enhanced Pharmacy Services and Pharmacist Review  Debbora Dus, CPP notified  Margaretmary Dys, [place] [phone]  Total time spent for month: 49

## 2020-12-08 ENCOUNTER — Ambulatory Visit (INDEPENDENT_AMBULATORY_CARE_PROVIDER_SITE_OTHER): Payer: Medicare Other | Admitting: Family Medicine

## 2020-12-08 ENCOUNTER — Other Ambulatory Visit: Payer: Self-pay

## 2020-12-08 ENCOUNTER — Encounter: Payer: Self-pay | Admitting: Family Medicine

## 2020-12-08 DIAGNOSIS — R413 Other amnesia: Secondary | ICD-10-CM

## 2020-12-08 DIAGNOSIS — E119 Type 2 diabetes mellitus without complications: Secondary | ICD-10-CM | POA: Diagnosis not present

## 2020-12-08 MED ORDER — LANTUS SOLOSTAR 100 UNIT/ML ~~LOC~~ SOPN
40.0000 [IU] | PEN_INJECTOR | Freq: Every day | SUBCUTANEOUS | 0 refills | Status: DC
Start: 2020-12-08 — End: 2021-02-14

## 2020-12-08 NOTE — Patient Instructions (Addendum)
Goal sugar around 150, keep adding 1 unit a day if needed.    Recheck in about 3 months.  We can do labs at the visit.  We'll check your memory at that point.   Take care.  Glad to see you.

## 2020-12-08 NOTE — Progress Notes (Signed)
This visit occurred during the SARS-CoV-2 public health emergency.  Safety protocols were in place, including screening questions prior to the visit, additional usage of staff PPE, and extensive cleaning of exam room while observing appropriate contact time as indicated for disinfecting solutions.  He stopped lisinopril and the cough got better.  He feels better overall.  We discussed staying off lisinopril at this point.  He had been adding 1 unit of insulin daily until AM sugar is abound goal of 150.  He is now up to 40 units recently.  Sugar has been recently 150-200.    No chest discomfort in the meantime.    Memory follow up. He clearly feels better today.  Discussed memory today.  MMSE is 22 out of 30.  -2 each for orientation and attention, -3 for recall, -1 for pentagon copy.  Previous work-up discussed with patient and son.  He has support at home from family.  No red flag/dangerous events related to memory loss.  Meds, vitals, and allergies reviewed.   ROS: Per HPI unless specifically indicated in ROS section   GEN: nad, alert and oriented to place, person, month, but not the year. HEENT: NCAT NECK: supple w/o LA CV: rrr.  PULM: ctab, no inc wob ABD: soft, +bs EXT: no edema SKIN: Well-perfused.

## 2020-12-10 NOTE — Assessment & Plan Note (Signed)
Goal sugar around 150, keep adding 1 unit of insulin a day if needed.   His son can help him with his insulin if needed. Recheck in about 3 months.  We can do labs at the visit.  Would stay off lisinopril at this point since he feels better without cough since stopping it.

## 2020-12-10 NOTE — Assessment & Plan Note (Signed)
I was hopeful that his memory would be improved when his blood pressure was better off lisinopril.  I suspect he does have a primary memory issue.  We talked about referral to neurology, treatment with donepezil, or observation.  He wants to consider his options in the meantime and we can recheck him in 3 months.  We can recheck his memory at that point.  If patient and family notices significant changes in the meantime they can always update me.  We can reconsider treatment later on, meaning at that appointment.  He agrees with plan.  32 minutes were devoted to patient care in this encounter (this includes time spent reviewing the patient's file/history, interviewing and examining the patient, counseling/reviewing plan with patient).

## 2020-12-12 ENCOUNTER — Telehealth: Payer: Self-pay

## 2020-12-12 ENCOUNTER — Other Ambulatory Visit: Payer: Self-pay

## 2020-12-12 MED ORDER — TAMSULOSIN HCL 0.4 MG PO CAPS: 0.4000 mg | ORAL_CAPSULE | Freq: Every day | ORAL | 3 refills | Status: DC

## 2020-12-12 MED ORDER — METOPROLOL SUCCINATE ER 50 MG PO TB24: 50.0000 mg | ORAL_TABLET | Freq: Every day | ORAL | 1 refills | Status: DC

## 2020-12-12 NOTE — Telephone Encounter (Signed)
-----   Message from Debbora Dus, [facility] sent at 12/12/2020 10:02 AM EDT ----- Regarding: Refill Requesting refills on tamsulosin and metoprolol to [facility].  Thank you!  Debbora Dus, PharmD Clinical Pharmacist [place] [facility] [place] [phone]

## 2020-12-12 NOTE — Telephone Encounter (Signed)
Refills have been sent to [facility].

## 2020-12-13 NOTE — Patient Instructions (Signed)
Dear Paul Terrell,  It was a pleasure meeting you during our initial appointment on December 04, 2020. Below is a summary of the goals we discussed and components of chronic care management. Please contact me anytime with questions or concerns.   Visit Information  Patient Care Plan: CCM Pharmacy Care Plan    Problem Identified: CHL AMB "PATIENT-SPECIFIC PROBLEM"     Long-Range Goal: Disease Management   Start Date: 12/04/2020  Priority: High  Note:   Current Barriers:  . Unable to achieve control of diabetes   . Medications not synchronized  Pharmacist Clinical Goal(s):  Marland Kitchen Over the next 90 days, patient will contact provider office for questions/concerns as evidenced notation of same in electronic health record through collaboration with PharmD and provider.   Interventions: . 1:1 collaboration with Tonia Ghent, MD regarding development and update of comprehensive plan of care as evidenced by provider attestation and co-signature . Inter-disciplinary care team collaboration (see longitudinal plan of care) . Comprehensive medication review performed; medication list updated in electronic medical record  Hypertension (BP goal < 150/90) -Not ideally controlled - consider BP goal <150/90 due to comorbidity/fall risk -Current treatment: . Hydrochlorothiazide 12.5 mg - 1 capsule daily at breakfast . Metoprolol succinate 50 mg - 1 tablet daily at breakfast -Medications previously tried: Lisinopril - on HOLD due to cough -Current home readings:  148/73, 70  (today during call, arm cuff) -Current dietary habits: sitter cooks breakfast and lunch, son brings dinner; denies any sugary drinks; snacks on cookies, friends bring pies and treats by often  -Current exercise habits: minimal -Denies hypotensive/hypertensive symptoms -Denies any recent falls or dizziness. Reports history of falls. Walks with a cane. Does not drive. -Educated on Symptoms of hypotension and importance of maintaining  adequate hydration; -Counseled to monitor BP at home a few days this week and bring to PCP visit -Recommended to continue current medication; Bring BP log to PCP visit.  Hyperlipidemia: (LDL goal < 70, CAD) -Not ideally controlled, on max tolerated statin - LDL 81 -Current treatment: . Pravastatin 20 mg - 1 tablet daily at bedtime  . Aspirin 81 mg - 1 tablet daily at bedtime  -Medications previously tried: Zocor - myalgia -Prefer to avoid additional treament considering age/limited benefit -Recommended to continue current medication  Diabetes (A1c goal <8%) -Uncontrolled - A1c 9.4% -Current medications: . Lantus - Inject 36 units at bedtime (titrating by 1 unit/day until fasting BG < 150 ) . Glyburide 5 mg - 2 tablets daily with breakfast -Medications previously tried: metformin (GI upset) -Current home glucose readings - checks every morning . fasting glucose: 163, 158, 128, 202, 173 . post prandial glucose: none . Denies any hypoglyemia  -Denies hypoglycemic/hyperglycemic symptoms -Educated on Prevention and management of hypoglycemic episodes; -Counseled to check feet daily and get yearly eye exams -Recommended to continue current medication; Continue to titrate Lantus by 1 unit/day until fasting BG < 150 per PCP instruction   Insomnia (Goal: Improve sleep) -Controlled  -Denies concerns today -Current treatment: . Mirtazapine 7.5 mg - 1 tablet daily at bedtime  . Melatonin 5 mg - 1 tablet daily PRN -Medications previously tried/failed: none reported -BMI 27, weight is stable. [name] reports good appetite.  -Continue to monitor for risk/benefits. -Recommended to continue current medication  BPH (Goal: Control symptoms) -Controlled  -Denies concerns today -Current treatment  . Finasteride 5 mg - 1 tablet daily . Tamsulosin 0.4 mg - 1 capsule daily -Medications previously tried:none reported -Recommended to continue current  medication  Patient Goals/Self-Care  Activities . Over the next 90 days, patient will:  - take medications as prescribed check glucose daily, document, and provide at future appointments  -continue to titrate Lantus by 1 unit/day until fasting BG < 150 -check BP a few days this week and bring to PCP appointment   Follow Up Plan: Next PCP appointment scheduled for:  December 08, 2020   Medication Assistance: None required.  Patient affirms current coverage meets needs.     Mr. Fetty was given information about Chronic Care Management services today including:  1. CCM service includes personalized support from designated clinical staff supervised by his physician, including individualized plan of care and coordination with other care providers 2. 24/7 contact phone numbers for assistance for urgent and routine care needs. 3. Standard insurance, coinsurance, copays and deductibles apply for chronic care management only during months in which we provide at least 20 minutes of these services. Most insurances cover these services at 100%, however patients may be responsible for any copay, coinsurance and/or deductible if applicable. This service may help you avoid the need for more expensive face-to-face services. 4. Only one practitioner may furnish and bill the service in a calendar month. 5. The patient may stop CCM services at any time (effective at the end of the month) by phone call to the office staff.  Patient agreed to services and verbal consent obtained.   The patient verbalized understanding of instructions, educational materials, and care plan provided today and agreed to receive a mailed copy of patient instructions, educational materials, and care plan.  The pharmacy team will reach out to the patient again over the next 90 days.   Debbora Dus, PharmD Clinical Pharmacist [facility] at [facility] [phone]   Basics of Medicine Management Taking your medicines correctly is an important part of  managing or preventing medical problems. Make sure you know what disease or condition your medicine is treating, and how and when to take it. If you do not take your medicine correctly, it may not work well and may cause unpleasant side effects, including serious health problems. What should I do when I am taking medicines?  Read all the labels and inserts that come with your medicines. Review the information often.  Talk with your pharmacist if you get a refill and notice a change in the size, color, or shape of your medicines.  Know the potential side effects for each medicine that you take.  Try to get all your medicines from the same pharmacy. The pharmacist will have all your information and will understand how your medicines will affect each other (interact).  Tell your health care provider about all your medicines, including over-the-counter medicines, vitamins, and herbal or dietary supplements. He or she will make sure that nothing will interact with any of your prescribed medicines.   How can I take my medicines safely?  Take medicines only as told by your health care provider. ? Do not take more of your medicine than instructed. ? Do not take anyone else's medicines. ? Do not share your medicines with others. ? Do not stop taking your medicines unless your health care provider tells you to do so. ? You may need to avoid alcohol or certain foods or liquids when taking certain medicines. Follow your health care provider's instructions.  Do not split, mash, or chew your medicines unless your health care provider tells you to do so. Tell your health care provider if you have  trouble swallowing your medicines.  For liquid medicine, use the dosing container that was provided. How should I organize my medicines? Know your medicines  Know what each of your medicines looks like. This includes size, color, and shape. Tell your health care provider if you are having trouble recognizing all  the medicines that you are taking.  If you cannot tell your medicines apart because they look similar, keep them in original bottles.  If you cannot read the labels on the bottles, tell your pharmacist to put your medicines in containers with large print.  Review your medicines and your schedule with family members, a friend, or a caregiver. Use a pill organizer  Use a tool to organize your medicine schedule. Tools include a weekly pillbox, a written chart, a notebook, or a calendar.  Your tool should help you remember the following things about each medicine: ? The name of the medicine. ? The amount (dose) to take. ? The schedule. This is the day and time the medicine should be taken. ? The appearance. This includes color, shape, size, and stamp. ? How to take your medicines. This includes instructions to take them with food, without food, with fluids, or with other medicines.  Create reminders for taking your medicines. Use sticky notes, or alarms on your watch, mobile device, or phone calendar.  You may choose to use a more advanced management system. These systems have storage, alarms, and visual and audio prompts.  Some medicines can be taken on an "as-needed" basis. These include medicines for nausea or pain. If you take an as-needed medicine, write down the name and dose, as well as the date and time that you took it.   How should I plan for travel?  Take your pillbox, medicines, and organization system with you when traveling.  Have your medicines refilled before you travel. This will ensure that you do not run out of your medicines while you are away from home.  Always carry an updated list of your medicines with you. If there is an emergency, a first responder can quickly see what medicines you are taking.  Do not pack your medicines in checked luggage in case your luggage is lost or delayed.  If any of your medicines is considered a controlled substance, make sure you  bring a letter from your health care provider with you. How should I store and discard my medicines? For safe storage:  Store medicines in a cool, dry area away from light, or as directed by your health care provider. Do not store medicines in the bathroom. Heat and humidity will affect them.  Do not store your medicines with other chemicals, or with medicines for pets or other household members.  Keep medicines away from children and pets. Do not leave them on counters or bedside tables. Store them in high cabinets or on high shelves. For safe disposal:  Check expiration dates regularly. Do not take expired medicines. Discard medicines that are older than the expiration date.  Learn a safe way to dispose of your medicines. You may: ? Use a local government, hospital, or pharmacy medicine-take-back program. ? Mix the medicines with inedible substances, put them in a sealed bag or empty container, and throw them in the trash. What should I remember?  Tell your health care provider if you: ? Experience side effects. ? Have new symptoms. ? Have other concerns about taking your medicines.  Review your medicines regularly with your health care provider. Other medicines, diet, medical  conditions, weight changes, and daily habits can all affect how medicines work. Ask if you need to continue taking each medicine, and discuss how well each one is working.  Refill your medicines early to avoid running out of them.  In case of an accidental overdose, call your local [place] at [phone] or visit your local emergency department immediately. This is important. Summary  Taking your medicines correctly is an important part of managing or preventing medical problems.  You need to make sure that you understand what you are taking a medicine for, as well as how and when you need to take it.  Know your medicines and use a pill organizer to help you take your medicines  correctly.  In case of an accidental overdose, call your local [place] at [phone] or visit your local emergency department immediately. This is important. This information is not intended to replace advice given to you by your health care provider. Make sure you discuss any questions you have with your health care provider. Document Revised: 09/04/2017 Document Reviewed: 09/04/2017 Elsevier Patient Education  2021 [employer].

## 2020-12-19 ENCOUNTER — Telehealth: Payer: Self-pay

## 2020-12-19 MED ORDER — ONETOUCH ULTRASOFT LANCETS MISC: 3 refills | Status: DC

## 2020-12-19 NOTE — Telephone Encounter (Signed)
-----   Message from [place] sent at 12/19/2020 11:38 AM EDT ----- Regarding: refill Will you please send refill of lancets for patient to [facility].  Thank you, Margaretmary Dys

## 2021-01-15 IMAGING — CT CT ANGIO CHEST
2 of 6 series · 18 of 46 positions shown · IV contrast (omnipaque)
Comparison: CT chest 10/19/2019.

CLINICAL DATA: Thoracic aortic aneurysm on prior CT.

EXAM:
CT ANGIOGRAPHY CHEST WITH CONTRAST
TECHNIQUE: Multidetector CT imaging of the chest was performed using the
standard protocol during bolus administration of intravenous
contrast. Multiplanar CT image reconstructions and MIPs were
obtained to evaluate the vascular anatomy.
CONTRAST:  75 mL OMNIPAQUE IOHEXOL 350 MG/ML SOLN

[Series 4: axial arterial · axial · arterial · 0.86mm/px · z∈[+1299,+1567]mm · 15 of 154 slices shown]
[im 10/154  lung]
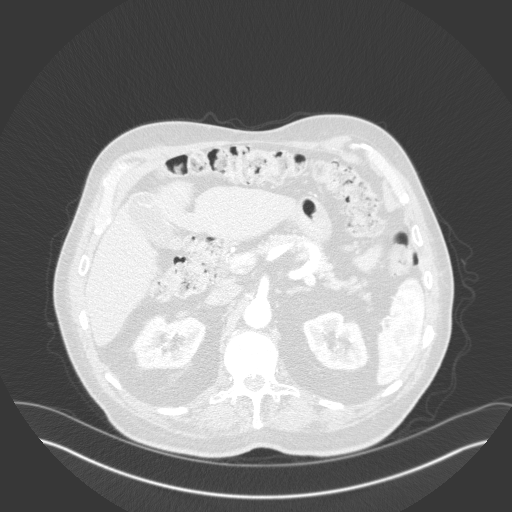
[im 20/154  soft-tissue]
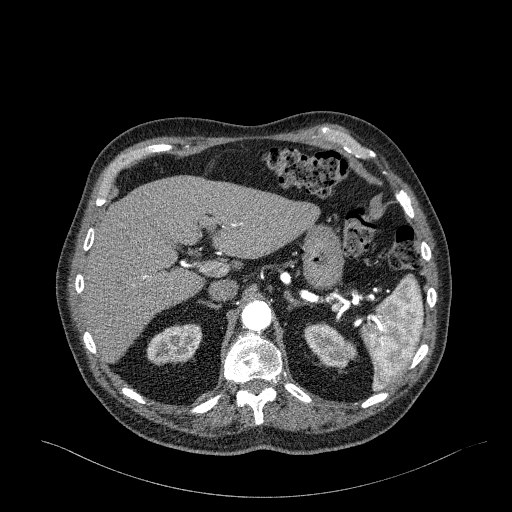
[im 29/154  lung]
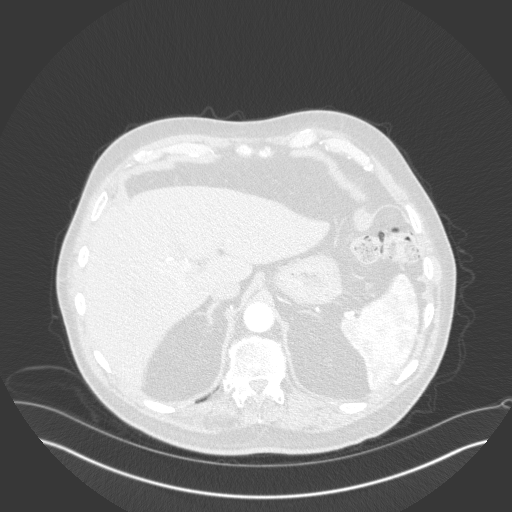
[im 39/154  soft-tissue]
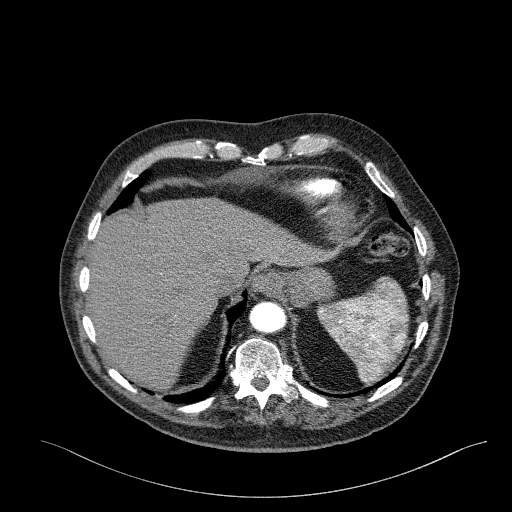
[im 48/154  lung]
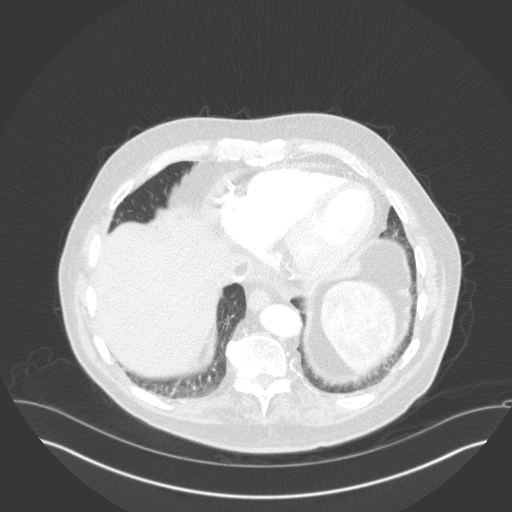
[im 58/154  soft-tissue]
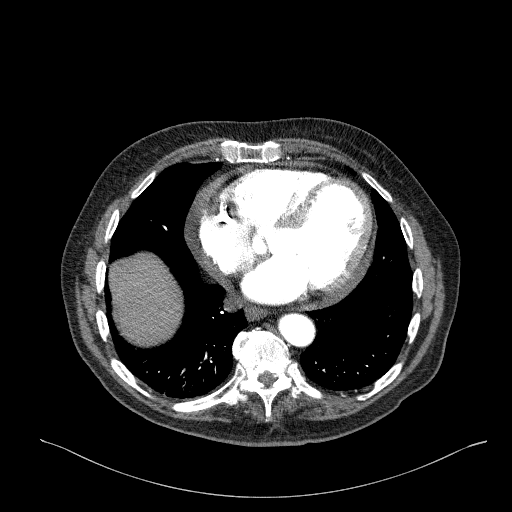
[im 67/154  lung]
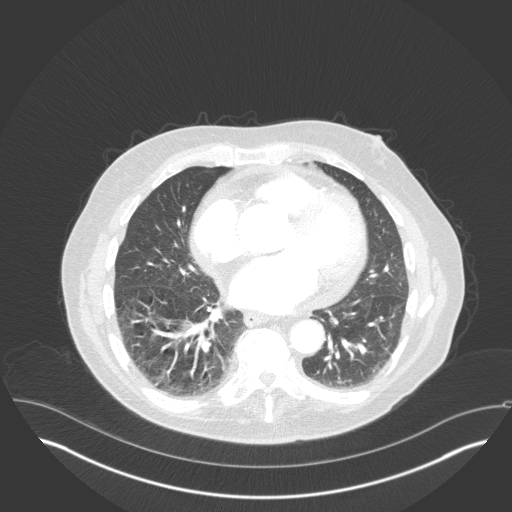
[im 77/154  soft-tissue]
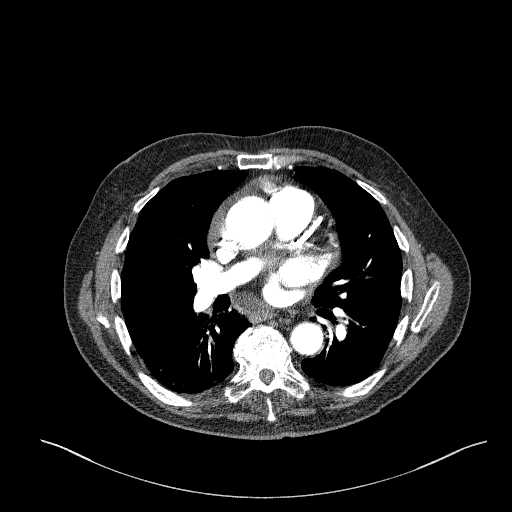
[im 87/154  lung]
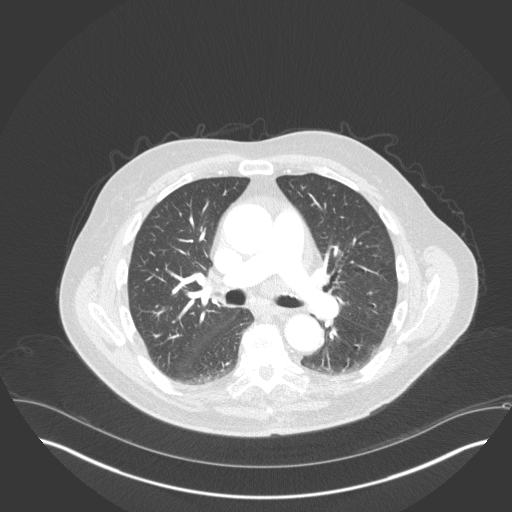
[im 96/154  soft-tissue]
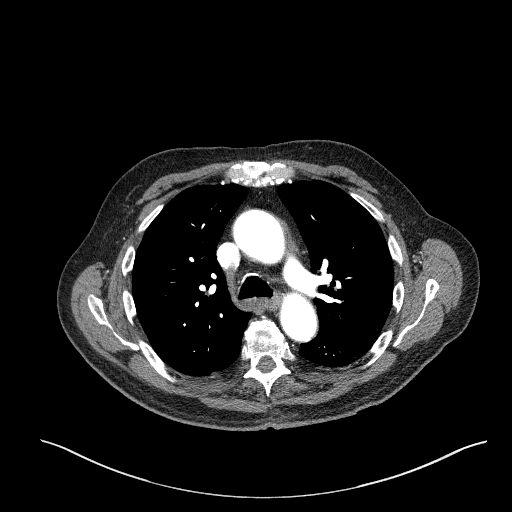
[im 106/154  lung]
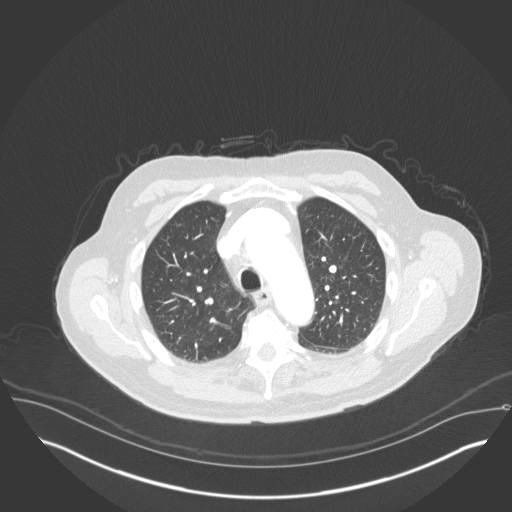
[im 115/154  soft-tissue]
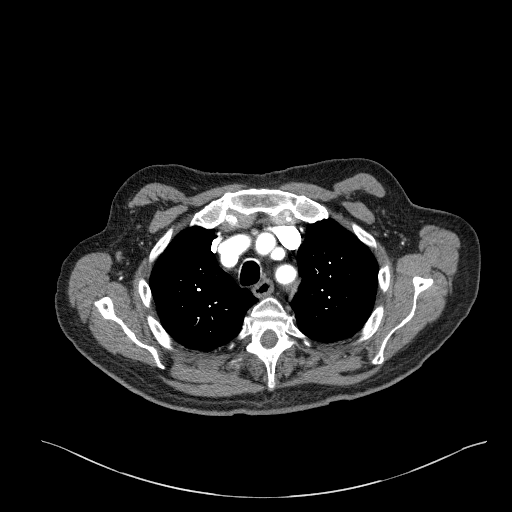
[im 125/154  lung]
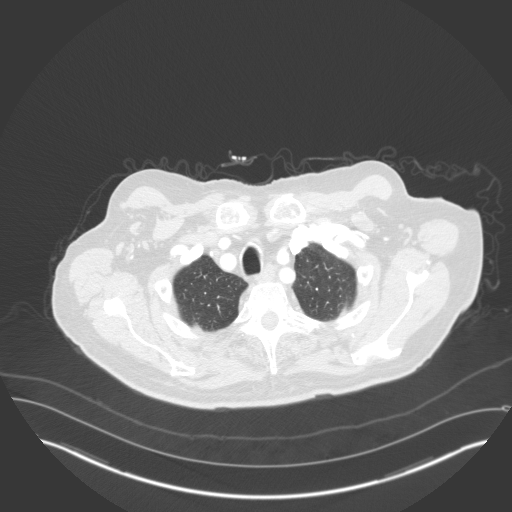
[im 134/154  soft-tissue]
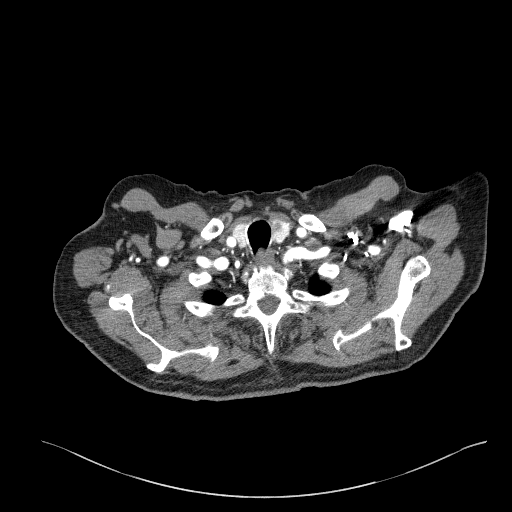
[im 144/154  lung]
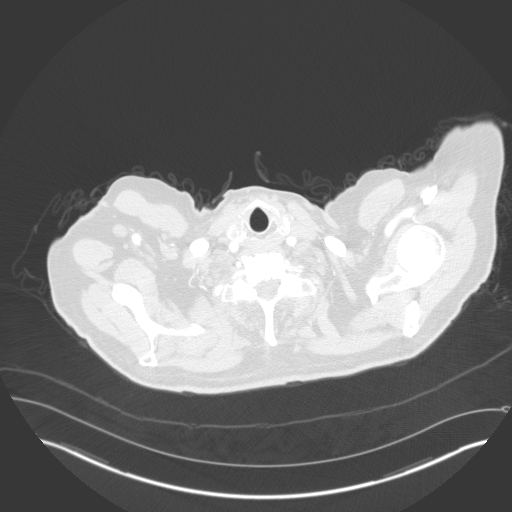

[Series 5: coronal · coronal · 0.62mm/px · 3 of 131 slices shown]
[im 33/131  soft-tissue]
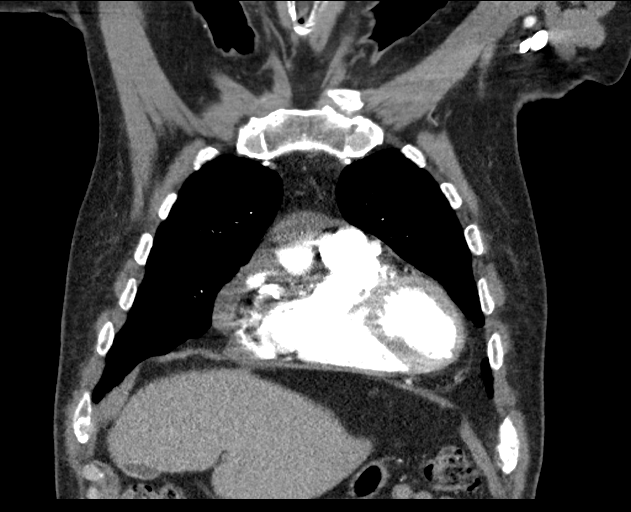
[im 66/131  soft-tissue]
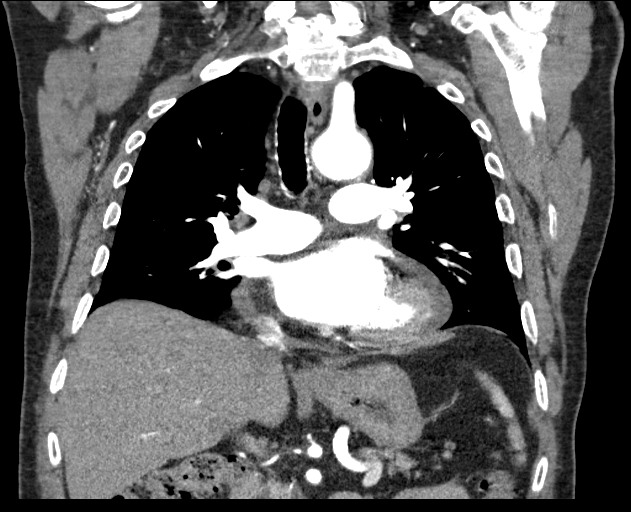
[im 98/131  soft-tissue]
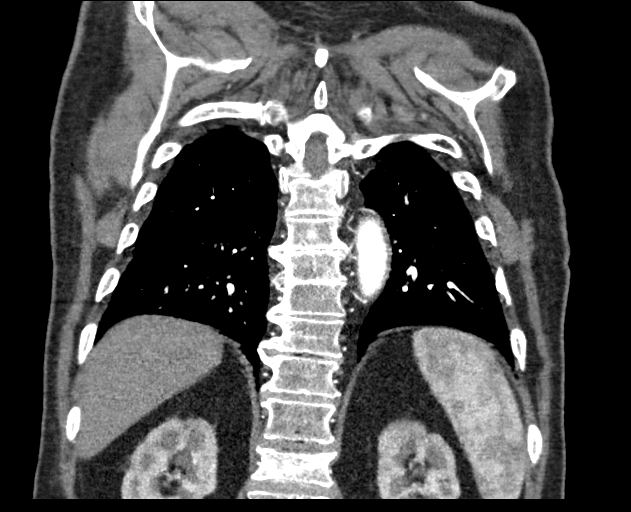

[18 of 46 positions shown; findings below may reference images not displayed]

FINDINGS: Cardiovascular: The ascending thoracic aorta measures up to 4.1 cm
in diameter, unchanged. There is cardiomegaly. Small to moderate
pericardial effusion is unchanged. Calcific aortic and coronary
atherosclerosis noted. No aortic dissection.

Mediastinum/Nodes: Single low attenuating lesions are seen in both
lobes of the thyroid measuring approximately 0.9 cm. Not clinically
significant; no follow-up imaging recommended (ref: [HOSPITAL]. [DATE]): 143-50).No lymphadenopathy. Esophagus
unremarkable.

Lungs/Pleura: Minimal dependent atelectasis. No pleural or
pericardial effusion.

Upper Abdomen: No acute or focal abnormality.

Musculoskeletal: The patient has a superior endplate compression
fracture of L1 which is new since the prior CT. Thoracic spondylosis
noted.

Review of the MIP images confirms the above findings.
IMPRESSION: No change in the appearance of the patient's small ascending
thoracic aortic aneurysm which measures 4.1 cm on today's study
Recommend annual imaging followup by CTA or MRA. This recommendation
follows 5979 ACCF/AHA/AATS/ACR/ASA/SCA/PARKE/DRSAGFREY/MAKIA/ABDIKARIM Guidelines
for the Diagnosis and Management of Patients with Thoracic Aortic
Disease. Circulation. 5979; 121: E266-e369. Aortic aneurysm NOS
(RHJV3-JCJ.L).

L1 superior endplate compression fracture is new since the prior CT
but remains age indeterminate.

No change in a small to moderate pericardial effusion.

Cardiomegaly.

Aortic Atherosclerosis (RHJV3-2T9.9).

## 2021-02-09 ENCOUNTER — Telehealth: Payer: Self-pay

## 2021-02-09 NOTE — Chronic Care Management (AMB) (Addendum)
  Chronic Care Management Pharmacy Assistant   Name: Paul Terrell  MRN: [id number] DOB: 04/12/1931   Reason for Encounter: Disease State DM   Recent office visits:  02/15/2021  PCP  No medication changes 12/08/2020  Dr.Graham Duncan PCP stopped Lisinopril due to cough follow up 3 months  Recent consult visits:  10/19/2020 urgent care for right foot pain   Hospital visits:  None in 6 months  Medications: Outpatient Encounter Medications as of 02/09/2021  Medication Sig   acetaminophen (TYLENOL) 500 MG tablet Take 500 mg by mouth every 6 (six) hours as needed (for pain or headaches).   aspirin 81 MG tablet Take 81 mg by mouth at bedtime.   blood glucose meter kit and supplies Dispense Accu-chek meter. Check sugar once daily. DX E11.9   Cholecalciferol (VITAMIN D-3) 5000 UNITS TABS Take 5,000 Units by mouth daily. breakfast   ferrous sulfate 325 (65 FE) MG tablet Take 1 tablet (325 mg total) by mouth daily with breakfast.   finasteride (PROSCAR) 5 MG tablet TAKE 1 TABLET (5 MG TOTAL) BY MOUTH DAILY.   glucose blood test strip Use up to 3 times a day.  Insulin treated DM2.  For Verio meter   glyBURIDE (DIABETA) 5 MG tablet TAKE 2 TABLETS (10 MG TOTAL) DAILY WITH BREAKFAST.   hydrochlorothiazide (MICROZIDE) 12.5 MG capsule Take 1 capsule (12.5 mg total) by mouth daily.   insulin glargine (LANTUS SOLOSTAR) 100 UNIT/ML Solostar Pen Inject 40 Units into the skin daily. Dx E11.9   Insulin Pen Needle (PEN NEEDLES) 31G X 5 MM MISC Use daily with insulin pen.   Lancets (ONETOUCH ULTRASOFT) lancets Test once daily Dx 250.00   Melatonin 5 MG TABS Take 5 mg by mouth at bedtime as needed.   metoprolol succinate (TOPROL-XL) 50 MG 24 hr tablet Take 1 tablet (50 mg total) by mouth daily. Take with or immediately following a meal.   mirtazapine (REMERON) 7.5 MG tablet Take 1 tablet (7.5 mg total) by mouth at bedtime.   Multiple Vitamin (MULTIVITAMIN) tablet Take 1 tablet by mouth daily. breakfast    pantoprazole (PROTONIX) 40 MG tablet Take 1 tablet (40 mg total) by mouth daily.   pravastatin (PRAVACHOL) 20 MG tablet TAKE 1 TABLET AT BEDTIME   tamsulosin (FLOMAX) 0.4 MG CAPS capsule Take 1 capsule (0.4 mg total) by mouth at bedtime.   vitamin B-12 (CYANOCOBALAMIN) 250 MCG tablet Take 250 mcg by mouth 2 (two) times daily. 1 in the morning and 1 at bedtime   No facility-administered encounter medications on file as of 02/09/2021.    Recent Office Vitals: BP Readings from Last 3 Encounters:  12/08/20 134/70  11/21/20 (!) 112/58  10/19/20 (!) 153/86   Pulse Readings from Last 3 Encounters:  12/08/20 67  11/21/20 73  10/19/20 81    Wt Readings from Last 3 Encounters:  12/08/20 181 lb (82.1 kg)  11/21/20 182 lb (82.6 kg)  10/03/20 184 lb (83.5 kg)     Kidney Function Lab Results  Component Value Date/Time   CREATININE 1.36 11/03/2020 08:30 AM   CREATININE 1.37 10/03/2020 09:25 AM   GFR 46.77 (L) 11/03/2020 08:30 AM   GFRNONAA >60 01/17/2020 06:26 PM   GFRAA >60 01/17/2020 06:26 PM    BMP Latest Ref Rng & Units 11/03/2020 10/03/2020 07/24/2020  Glucose 70 - 99 mg/dL 214(H) 397(H) 253(H)  BUN 6 - 23 mg/dL 18 22 17  Creatinine 0.40 - 1.50 mg/dL 1.36 1.37 1.38  Sodium 135 -   145 mEq/L 134(L) 133(L) 134(L)  Potassium 3.5 - 5.1 mEq/L 5.2 No hemolysis seen(H) 5.1 4.6  Chloride 96 - 112 mEq/L 97 101 100  CO2 19 - 32 mEq/L _0 Calcium 8.4 - 10.5 mg/dL 9.5 9.2 9.1    Recent Relevant Labs: Lab Results  Component Value Date/Time   HGBA1C 9.4 Repeated and verified X2. (H) 11/03/2020 08:30 AM   HGBA1C 8.1 (H) 07/24/2020 02:44 PM   MICROALBUR 6.4 (H) 11/06/2017 08:50 AM   MICROALBUR 6.2 (H) 08/05/2017 11:40 AM    Kidney Function Lab Results  Component Value Date/Time   CREATININE 1.36 11/03/2020 08:30 AM   CREATININE 1.37 10/03/2020 09:25 AM   GFR 46.77 (L) 11/03/2020 08:30 AM   GFRNONAA >60 01/17/2020 06:26 PM   GFRAA >60 01/17/2020 06:26 PM   Current  antihyperglycemic regimen:  Lantus - Inject 46 units at bedtime Glyburide 5 mg - 2 tablets daily with breakfast   Patient verbally confirms he is taking the above medications as directed. Yes per the son this is the current dose and saw PCP yesterday.  What recent interventions/DTPs have been made to improve glycemic control:  Goal sugar around 150, keep adding 1 unit of insulin a day if needed.   His son can help him with his insulin if needed.  Have there been any recent hospitalizations or ED visits since last visit with CPP? No  Patient denies hypoglycemic symptoms, including Pale, Sweaty, Shaky, Hungry, Nervous/irritable and Vision changes  Patient denies hyperglycemic symptoms, including blurry vision, excessive thirst, fatigue, polyuria and weakness  How often are you checking your blood sugar? once daily What are your blood sugars ranging?  Fasting: 01/29/21-109         01/30/21-181         01/31/21-257         02/01/21-239         02/02/21-92   02/03/21-208   02/04/21-154   02/05/21-451 (per the son, the patient did not take his lantus the night before and the patient ate a bowl of ice cream and per the son the patient did not feel bad with that reading)   02/06/21-122   02/07/21-125   02/08/21-123   02/09/21-142   02/10/21-166   02/09/21-202 Before meals: n/a After meals: n/a Bedtime: n/a  On insulin? Yes  During the week, how often does your blood glucose drop below 70? Never Are you checking your feet daily/regularly? Yes  Adherence Review: Is the patient currently on a STATIN medication? Yes Is the patient currently on ACE/ARB medication? No Does the patient have >5 day gap between last estimated fill dates? No  Star Rating Drugs:  Medication:  Last Fill: Day Supply Pravastatin 5m 01/16/21 30 Glyburide 536m            01/31/21 40  Follow-Up:  Pharmacist Review  MiDebbora DusCPP notified  VeAvel SensorCThe Heights Hospitallinical Pharmacy Assistant [phone] have  reviewed the care management and care coordination activities outlined in this encounter and I am certifying that I agree with the content of this note. No further action required.  MiDebbora DusPharmD Clinical Pharmacist [phone]

## 2021-02-11 ENCOUNTER — Other Ambulatory Visit: Payer: Self-pay | Admitting: Family Medicine

## 2021-02-14 ENCOUNTER — Telehealth: Payer: Self-pay

## 2021-02-14 MED ORDER — LANTUS SOLOSTAR 100 UNIT/ML ~~LOC~~ SOPN: 46.0000 [IU] | PEN_INJECTOR | Freq: Every day | SUBCUTANEOUS | 5 refills | Status: DC

## 2021-02-14 NOTE — Telephone Encounter (Signed)
-----   Message from [place] sent at 02/14/2021  9:09 AM EDT ----- Regarding: Refill Patient states he has been using 46 units of lantus. The last script [facility] received was for 30 units daily although I see one for 40 units looks like it was sent in March. Will you please send updated script reflecting 46 units of Lantus to [facility] if appropriate?   Thank you   Margaretmary Dys, [facility] Pharmacy Assistant [phone]

## 2021-02-14 NOTE — Chronic Care Management (AMB) (Signed)
Patient states he is using 46 units of Lantus instead of 30 units that [facility] has on file. Requested updated script from Dr. Josefine Class office [facility].

## 2021-02-14 NOTE — Telephone Encounter (Signed)
Updated rx for lanuts [facility].

## 2021-02-15 ENCOUNTER — Ambulatory Visit (INDEPENDENT_AMBULATORY_CARE_PROVIDER_SITE_OTHER): Payer: Medicare Other | Admitting: Family Medicine

## 2021-02-15 ENCOUNTER — Encounter: Payer: Self-pay | Admitting: Family Medicine

## 2021-02-15 ENCOUNTER — Other Ambulatory Visit: Payer: Self-pay

## 2021-02-15 DIAGNOSIS — H9190 Unspecified hearing loss, unspecified ear: Secondary | ICD-10-CM

## 2021-02-15 NOTE — Patient Instructions (Signed)
Take care.  Glad to see you. See if they can tune up your hearing aids.  Update me as needed.

## 2021-02-15 NOTE — Progress Notes (Signed)
This visit occurred during the SARS-CoV-2 public health emergency.  Safety protocols were in place, including screening questions prior to the visit, additional usage of staff PPE, and extensive cleaning of exam room while observing appropriate contact time as indicated for disinfecting solutions.  B hearing loss, worse in the last 3 weeks.  Has hearing aids, with recent adjustment w/o relief.  No fevers.  No chills.  No ear drainage.  Meds, vitals, and allergies reviewed.   ROS: Per HPI unless specifically indicated in ROS section   nad ncat R ear cerumen impaction.   None in L canal.   Pinna wnl B Irrigation with resolution R canal.  Hearing better in the meantime.  Tolerated irrigation without complication.

## 2021-02-19 DIAGNOSIS — H9190 Unspecified hearing loss, unspecified ear: Secondary | ICD-10-CM | POA: Insufficient documentation

## 2021-02-19 NOTE — Assessment & Plan Note (Addendum)
Noted at baseline but worse in the last few weeks.  Attributed to cerumen impaction.  Resolved with irrigation.  Improved in the meantime.  He will update me as needed.  He can restart using his hearing aids after his ear canal dryness.

## 2021-02-20 MED ORDER — LANTUS SOLOSTAR 100 UNIT/ML ~~LOC~~ SOPN: 46.0000 [IU] | PEN_INJECTOR | Freq: Every day | SUBCUTANEOUS | 5 refills | Status: DC

## 2021-02-20 NOTE — Telephone Encounter (Signed)
It seems this was processed as a "no print" order so it was not submitted to the pharmacy, it just updated the chart. Let me know if I am incorrect. Patient is out of medication if you could resubmit order to [facility] pharmacy.  Debbora Dus, PharmD Clinical Pharmacist [facility] at [facility] [phone]

## 2021-02-20 NOTE — Telephone Encounter (Signed)
Looks like rx was set to no print; this has been fixed and [facility] to [facility].

## 2021-02-20 NOTE — Addendum Note (Signed)
Addended by: Sherrilee Gilles B on: 02/20/2021 01:49 PM   Modules accepted: Orders

## 2021-03-02 ENCOUNTER — Other Ambulatory Visit: Payer: Self-pay

## 2021-03-02 ENCOUNTER — Telehealth: Payer: Self-pay

## 2021-03-02 ENCOUNTER — Ambulatory Visit: Payer: Medicare Other | Admitting: Podiatry

## 2021-03-02 DIAGNOSIS — S90821A Blister (nonthermal), right foot, initial encounter: Secondary | ICD-10-CM | POA: Diagnosis not present

## 2021-03-02 NOTE — Chronic Care Management (AMB) (Addendum)
Chronic Care Management Pharmacy Assistant   Name: Paul Terrell  MRN: [id number] DOB: 1931/09/15  Reason for Encounter: Medication Adherence and Delivery coordination   Recent office visits: 02/15/21 - Dr.Duncan PCP no medication changes  Recent consult visits:  None since last CCM contact  Hospital visits:  None in previous 6 months  Medications: Outpatient Encounter Medications as of 03/02/2021  Medication Sig   acetaminophen (TYLENOL) 500 MG tablet Take 500 mg by mouth every 6 (six) hours as needed (for pain or headaches).   aspirin 81 MG tablet Take 81 mg by mouth at bedtime.   blood glucose meter kit and supplies Dispense Accu-chek meter. Check sugar once daily. DX E11.9   Cholecalciferol (VITAMIN D-3) 5000 UNITS TABS Take 5,000 Units by mouth daily. breakfast   ferrous sulfate 325 (65 FE) MG tablet Take 1 tablet (325 mg total) by mouth daily with breakfast.   finasteride (PROSCAR) 5 MG tablet TAKE 1 TABLET (5 MG TOTAL) BY MOUTH DAILY.   glucose blood test strip Use up to 3 times a day.  Insulin treated DM2.  For Verio meter   glyBURIDE (DIABETA) 5 MG tablet TAKE 2 TABLETS (10 MG TOTAL) DAILY WITH BREAKFAST.   hydrochlorothiazide (MICROZIDE) 12.5 MG capsule Take 1 capsule (12.5 mg total) by mouth daily.   insulin glargine (LANTUS SOLOSTAR) 100 UNIT/ML Solostar Pen Inject 46 Units into the skin daily. Dx E11.9   Insulin Pen Needle (PEN NEEDLES) 31G X 5 MM MISC Use daily with insulin pen.   Lancets (ONETOUCH ULTRASOFT) lancets Test once daily Dx 250.00   Melatonin 5 MG TABS Take 5 mg by mouth at bedtime as needed.   metoprolol succinate (TOPROL-XL) 50 MG 24 hr tablet Take 1 tablet (50 mg total) by mouth daily. Take with or immediately following a meal.   mirtazapine (REMERON) 7.5 MG tablet Take 1 tablet (7.5 mg total) by mouth at bedtime.   Multiple Vitamin (MULTIVITAMIN) tablet Take 1 tablet by mouth daily. breakfast   pantoprazole (PROTONIX) 40 MG tablet TAKE 1 TABLET BY  MOUTH EVERY DAY   pravastatin (PRAVACHOL) 20 MG tablet TAKE 1 TABLET AT BEDTIME   tamsulosin (FLOMAX) 0.4 MG CAPS capsule Take 1 capsule (0.4 mg total) by mouth at bedtime.   vitamin B-12 (CYANOCOBALAMIN) 250 MCG tablet Take 250 mcg by mouth 2 (two) times daily. 1 in the morning and 1 at bedtime   No facility-administered encounter medications on file as of 03/02/2021.   BP Readings from Last 3 Encounters:  02/15/21 132/80  12/08/20 134/70  11/21/20 (!) 112/58    Lab Results  Component Value Date   HGBA1C 9.4 Repeated and verified X2. (H) 11/03/2020      No OVs, Consults, or hospital visits since last care coordination call / Pharmacist visit.  Patient obtains medications through Adherence Packaging  90 Days   Patient is due for next adherence delivery on: 03/13/2021  Spoke with patient on 03/02/2021  and reviewed medications and coordinated delivery.  This delivery to include: Adherence Packaging  90 Days  Packs: OTC aspirin 81 mg - 1 tablet daily at bedtime OTC ferrous sulfate 325 mg - 1 tablet every morning OTC Mens Multivitamin 50 + - 1 tablet every morning Glyburide 5 mg - 2 tablets daily with breakfast Pravastatin 1m 1 tablet at bedtime Tamsulosin 0.462m 1 tablet at bedtime Finasteride 32m34m1 tablet at bedtime Mirtazapine 7.32mg51mtablet at bedtime Metoprolol succinate ER 50 mg - 1 tablet at  breakfast HCTZ 12.20m - 1 tablet at breakfast  Other: Lancets Pen Needles Test Strips  Patient declined the following medications this month: Lantus - Inject 46 units at bedtime   Refills requested from PCP include:  None  Pt [facility]oprazole [facility] DS 02/14/21. Requested profile transfer to put in next pack.   Confirmed delivery date of 03/13/2021, advised patient that pharmacy will contact him the morning of delivery, spoke with son 03/02/21.  Follow-Up:  Coordination of Enhanced Pharmacy Services and Pharmacist Review  MiDebbora DusCPP  notified  VeAvel SensorCCAmity Gardensssistant [phone] have reviewed the care management and care coordination activities outlined in this encounter and I am certifying that I agree with the content of this note. No further action required.  MiDebbora DusPharmD Clinical Pharmacist [facility] at [phone]

## 2021-03-07 ENCOUNTER — Encounter: Payer: Self-pay | Admitting: Podiatry

## 2021-03-07 NOTE — Progress Notes (Signed)
  Subjective:  Patient ID: Paul Terrell, male    DOB: 08/07/1931,  MRN: [id number]  Chief Complaint  Patient presents with   Callouses    Right foot hallux    87 y.o. male returns for the above complaint.  Patient presents with a follow-up of right blister on the big toe.  Patient states is doing a lot better.  To be keeping covered.  It has resolved since then.  He denies any other acute complaints.  Objective:  There were no vitals filed for this visit. Podiatric Exam: Vascular: dorsalis pedis and posterior tibial pulses are palpable bilateral. Capillary return is immediate. Temperature gradient is WNL. Skin turgor WNL  Sensorium: Normal Semmes Weinstein monofilament test. Normal tactile sensation bilaterally. Nail Exam: Pt has thick disfigured discolored nails with subungual debris noted bilateral entire nail hallux through fifth toenails.  Pain on palpation to the nails. Ulcer Exam: There is no evidence of ulcer or pre-ulcerative changes or infection. Orthopedic Exam: Muscle tone and strength are WNL. No limitations in general ROM. No crepitus or effusions noted. HAV  B/L.  Hammer toes 2-5  B/L. Skin: No Porokeratosis. No infection or ulcers.  No further superficial blister without any concern for wound noted.  No clinical signs of infection noted no purulent drainage noted.   Assessment & Plan:   1. Friction blister of the foot, right, initial encounter      Patient was evaluated and treated and all questions answered.  Right hallux superficial blister -Clinically healed.  I have asked him to discontinue triple antibiotic at this time.  I have discussed shoe gear modification extensive detail he states understanding.  If any foot and ankle issues arise in future option, can see me.   Boneta Lucks, DPM    No follow-ups on file.

## 2021-03-09 ENCOUNTER — Ambulatory Visit (INDEPENDENT_AMBULATORY_CARE_PROVIDER_SITE_OTHER): Payer: Medicare Other | Admitting: Family Medicine

## 2021-03-09 ENCOUNTER — Encounter: Payer: Self-pay | Admitting: Family Medicine

## 2021-03-09 ENCOUNTER — Other Ambulatory Visit: Payer: Self-pay

## 2021-03-09 VITALS — BP 126/60 | HR 86 | Temp 98.0°F | Ht 68.0 in | Wt 181.0 lb

## 2021-03-09 DIAGNOSIS — E119 Type 2 diabetes mellitus without complications: Secondary | ICD-10-CM

## 2021-03-09 DIAGNOSIS — D649 Anemia, unspecified: Secondary | ICD-10-CM

## 2021-03-09 DIAGNOSIS — R413 Other amnesia: Secondary | ICD-10-CM

## 2021-03-09 LAB — BASIC METABOLIC PANEL
BUN: 21 mg/dL (ref 6–23)
CO2: 29 mEq/L (ref 19–32)
Calcium: 9.6 mg/dL (ref 8.4–10.5)
Chloride: 101 mEq/L (ref 96–112)
Creatinine, Ser: 1.35 mg/dL (ref 0.40–1.50)
GFR: 47.07 mL/min — ABNORMAL LOW (ref >60.00)
Glucose, Bld: 276 mg/dL — ABNORMAL HIGH (ref 70–99)
Potassium: 4.6 mEq/L (ref 3.5–5.1)
Sodium: 138 mEq/L (ref 135–145)

## 2021-03-09 LAB — VITAMIN B12: Vitamin B-12: >1550 pg/mL — ABNORMAL HIGH (ref 211–911)

## 2021-03-09 LAB — CBC WITH DIFFERENTIAL/PLATELET
Basophils Absolute: 0.1 10*3/uL (ref 0.0–0.1)
Basophils Relative: 1 % (ref 0.0–3.0)
Eosinophils Absolute: 0.2 10*3/uL (ref 0.0–0.7)
Eosinophils Relative: 2.6 % (ref 0.0–5.0)
HCT: 44.4 % (ref 39.0–52.0)
Hemoglobin: 15.4 g/dL (ref 13.0–17.0)
Lymphocytes Relative: 26.3 % (ref 12.0–46.0)
Lymphs Abs: 2.3 10*3/uL (ref 0.7–4.0)
MCHC: 34.6 g/dL (ref 30.0–36.0)
MCV: 96.7 fl (ref 78.0–100.0)
Monocytes Absolute: 0.7 10*3/uL (ref 0.1–1.0)
Monocytes Relative: 8.3 % (ref 3.0–12.0)
Neutro Abs: 5.4 10*3/uL (ref 1.4–7.7)
Neutrophils Relative %: 61.8 % (ref 43.0–77.0)
Platelets: 159 10*3/uL (ref 150.0–400.0)
RBC: 4.6 Mil/uL (ref 4.22–5.81)
RDW: 13.6 % (ref 11.5–15.5)
WBC: 8.7 10*3/uL (ref 4.0–10.5)

## 2021-03-09 LAB — HEMOGLOBIN A1C: Hgb A1c MFr Bld: 8.5 % — ABNORMAL HIGH (ref 4.6–6.5)

## 2021-03-09 LAB — TSH: TSH: 1.94 u[IU]/mL (ref 0.35–4.50)

## 2021-03-09 LAB — IRON: Iron: 155 ug/dL (ref 42–165)

## 2021-03-09 NOTE — Progress Notes (Signed)
This visit occurred during the SARS-CoV-2 public health emergency.  Safety protocols were in place, including screening questions prior to the visit, additional usage of staff PPE, and extensive cleaning of exam room while observing appropriate contact time as indicated for disinfecting solutions.  Diabetes:  Using medications without difficulties: yes Hypoglycemic episodes:no Hyperglycemic episodes:no Feet problems: prev blister resolved.   Blood Sugars averaging: up to 200s occ, usually lower.   eye exam within last year: yes  Tick bite.  Inferior to L axilla. It wasn't engorged.  No fevers.  No rash.  Only local irritation.    MMSE done at [place].  19/30.  See below.  Here today with son who is helping the patient at baseline.  He had hearing aid adjustment in the meantime.    Meds, vitals, and allergies reviewed.  ROS: Per HPI unless specifically indicated in ROS section   GEN: nad, alert and oriented to person but not the year. HEENT: ncat NECK: supple w/o LA CV: rrr. PULM: ctab, no inc wob ABD: soft, +bs EXT: no edema SKIN: no acute rash, only minor irritation inferior to left axilla where he reported having previous tick bite.  Advised patient/son to observe and report back as needed but this should not need intervention.  MMSE 19/30.    Diabetic foot exam: Normal inspection No skin breakdown No calluses  1+ DP pulses Dec sensation to light touch and monofilament Nails thickened.   32 minutes were devoted to patient care in this encounter (this includes time spent reviewing the patient's file/history, interviewing and examining the patient, counseling/reviewing plan with patient).

## 2021-03-09 NOTE — Patient Instructions (Addendum)
Go to the lab on the way out.   If you have mychart we'll likely use that to update you.    ?Take care.  Glad to see you. ?Don't change your meds for now.  ?

## 2021-03-11 NOTE — Assessment & Plan Note (Signed)
Continue glyburide and insulin.  See notes on labs.  We can set follow-up as previously as labs.

## 2021-03-11 NOTE — Assessment & Plan Note (Signed)
MMSE now 19/30.  Worse than previous.  This is in spite of cutting back on his blood pressure medications previously.  Check labs today for reversible causes.  MRI previously done with stable chronic findings, no new acute changes.

## 2021-03-12 ENCOUNTER — Telehealth: Payer: Self-pay

## 2021-03-12 MED ORDER — MIRTAZAPINE 7.5 MG PO TABS: 7.5000 mg | ORAL_TABLET | Freq: Every day | ORAL | 1 refills | Status: DC

## 2021-03-12 MED ORDER — FINASTERIDE 5 MG PO TABS: 5.0000 mg | ORAL_TABLET | Freq: Every day | ORAL | 1 refills | Status: DC

## 2021-03-12 MED ORDER — PEN NEEDLES 31G X 5 MM MISC: 3 refills | Status: DC

## 2021-03-12 NOTE — Telephone Encounter (Signed)
-----   Message from [place] sent at 03/12/2021 10:23 AM EDT ----- Regarding: Refill Refills needed for pen needles, mirtazapine, and finasteride. If appropriate, will you please send to [facility]?  Thank you,  Margaretmary Dys, [place] Pharmacy Assistant [phone]

## 2021-03-22 NOTE — Progress Notes (Addendum)
Patient's son called running low on Lantus, 2 days remaining. We requested a new prescription to reflect his current dose - 46 units daily. This has been received. Acute form uploaded for Lantus to be delivered tomorrow   Debbora Dus, CPP notified  Avel Sensor, [place] Assistant [phone]  I have reviewed the care management and care coordination activities outlined in this encounter and I am certifying that I agree with the content of this note. No further action required.  Debbora Dus, PharmD Clinical Pharmacist [facility] at [facility] [phone]

## 2021-05-08 ENCOUNTER — Telehealth: Payer: Self-pay

## 2021-05-08 NOTE — Chronic Care Management (AMB) (Addendum)
Chronic Care Management Pharmacy Assistant   Name: Paul Terrell  MRN: [id number] DOB: 06-Oct-1930  Reason for Encounter: General Adherence   Recent office visits:  03/13/21 - PCP - Lab Note - A1c improved to 8.5%. Dr. [name] recommended donepezil 5 mg. Patient's son wanted to discuss with family and call back. 03/09/21 - Dr.Duncan, PCP - Patient presented for follow up on anemia, diabetes and memory loss. Labs ordered. No medication changes.  Recent consult visits:  None since last CCM visit   Hospital visits:  None in previous 6 months  Medications: Outpatient Encounter Medications as of 05/08/2021  Medication Sig   acetaminophen (TYLENOL) 500 MG tablet Take 500 mg by mouth every 6 (six) hours as needed (for pain or headaches).   aspirin 81 MG tablet Take 81 mg by mouth at bedtime.   blood glucose meter kit and supplies Dispense Accu-chek meter. Check sugar once daily. DX E11.9   Cholecalciferol (VITAMIN D-3) 5000 UNITS TABS Take 5,000 Units by mouth daily. breakfast   ferrous sulfate 325 (65 FE) MG tablet Take 1 tablet (325 mg total) by mouth daily with breakfast.   finasteride (PROSCAR) 5 MG tablet Take 1 tablet (5 mg total) by mouth daily.   glucose blood test strip Use up to 3 times a day.  Insulin treated DM2.  For Verio meter   glyBURIDE (DIABETA) 5 MG tablet TAKE 2 TABLETS (10 MG TOTAL) DAILY WITH BREAKFAST.   hydrochlorothiazide (MICROZIDE) 12.5 MG capsule Take 1 capsule (12.5 mg total) by mouth daily.   insulin glargine (LANTUS SOLOSTAR) 100 UNIT/ML Solostar Pen Inject 46 Units into the skin daily. Dx E11.9   Insulin Pen Needle (PEN NEEDLES) 31G X 5 MM MISC Use daily with insulin pen.   Lancets (ONETOUCH ULTRASOFT) lancets Test once daily Dx 250.00   Melatonin 5 MG TABS Take 5 mg by mouth at bedtime as needed.   metoprolol succinate (TOPROL-XL) 50 MG 24 hr tablet Take 1 tablet (50 mg total) by mouth daily. Take with or immediately following a meal.   mirtazapine (REMERON)  7.5 MG tablet Take 1 tablet (7.5 mg total) by mouth at bedtime.   Multiple Vitamin (MULTIVITAMIN) tablet Take 1 tablet by mouth daily. breakfast   pantoprazole (PROTONIX) 40 MG tablet TAKE 1 TABLET BY MOUTH EVERY DAY   pravastatin (PRAVACHOL) 20 MG tablet TAKE 1 TABLET AT BEDTIME   tamsulosin (FLOMAX) 0.4 MG CAPS capsule Take 1 capsule (0.4 mg total) by mouth at bedtime.   vitamin B-12 (CYANOCOBALAMIN) 250 MCG tablet Take 250 mcg by mouth 2 (two) times daily. 1 in the morning and 1 at bedtime   No facility-administered encounter medications on file as of 05/08/2021.    [place] on 05/10/21 for general disease state and medication adherence call. Spoke with the son [name] who speaks on the patients behalf  Patient is not > 5 days past due for refill on the following medications per chart history:  [facility] Medications: Medication Name/mg Last Fill Days Supply Lantus    04/26/21  26 Pravastatin 51m  03/09/21 90 Glyburide 570m  03/09/21 90   What concerns do you have about your medications? The son ChGerald Stabstates no concerns on medications. The patient does not have any side effects from medications.  The patient denies side effects with his medications.   How often do you forget or accidentally miss a dose? Never  Do you use a pillbox? No the patient does packs and is  happy with how the medications are dispensed  Are you having any problems getting your medications from your pharmacy? No - [facility]   Has the cost of your medications been a concern? No  Since last visit with CPP, no interventions have been made.  The patient has not had an ED visit since last contact.   The patient reports the following problems with their health. The son [name] states Deja has become more irritable over the last month.  He denies concerns or questions for Debbora Dus, PharmD at this time.   Counseled patient on:  Doristine Devoid job taking medications, Importance of taking medication daily  without missed doses, and Access to CCM team for any cost, medication or pharmacy concerns.   Care Gaps: Last annual wellness visit: 11/21/20 If Diabetic:  Last eye exam / retinopathy screening:12/04/20 Last diabetic foot exam: 03/09/21  Upcoming visits: PCP appointment on 06/15/21  Debbora Dus, CPP notified  Avel Sensor, [place] Assistant [phone]  I have reviewed the care management and care coordination activities outlined in this encounter and I am certifying that I agree with the content of this note. Scheduled CCM visit for 06/2021.  Debbora Dus, PharmD Clinical Pharmacist [facility] at [facility] [phone]

## 2021-05-17 ENCOUNTER — Telehealth: Payer: Self-pay

## 2021-05-17 NOTE — Progress Notes (Addendum)
[facility] of [facility] to [facility] Fax [phone]  Debbora Dus, CPP notified  Avel Sensor, [place] Assistant [phone]

## 2021-05-28 ENCOUNTER — Other Ambulatory Visit: Payer: Self-pay | Admitting: Cardiovascular Disease

## 2021-05-30 ENCOUNTER — Other Ambulatory Visit: Payer: Self-pay | Admitting: Cardiovascular Disease

## 2021-05-31 ENCOUNTER — Telehealth: Payer: Self-pay

## 2021-05-31 NOTE — Chronic Care Management (AMB) (Addendum)
Chronic Care Management Pharmacy Assistant   Name: Paul Terrell  MRN: [id number] DOB: [birth year]   Reason for Encounter: Medication Adherence and Delivery Coordination   Recent office visits:  None since last CCM contact  Recent consult visits:  None since last CCM contact  Hospital visits:  None in previous 6 months  Medications: Outpatient Encounter Medications as of 05/31/2021  Medication Sig   acetaminophen (TYLENOL) 500 MG tablet Take 500 mg by mouth every 6 (six) hours as needed (for pain or headaches).   aspirin 81 MG tablet Take 81 mg by mouth at bedtime.   blood glucose meter kit and supplies Dispense Accu-chek meter. Check sugar once daily. DX E11.9   Cholecalciferol (VITAMIN D-3) 5000 UNITS TABS Take 5,000 Units by mouth daily. breakfast   ferrous sulfate 325 (65 FE) MG tablet Take 1 tablet (325 mg total) by mouth daily with breakfast.   finasteride (PROSCAR) 5 MG tablet Take 1 tablet (5 mg total) by mouth daily.   glucose blood test strip Use up to 3 times a day.  Insulin treated DM2.  For Verio meter   glyBURIDE (DIABETA) 5 MG tablet TAKE 2 TABLETS (10 MG TOTAL) DAILY WITH BREAKFAST.   hydrochlorothiazide (MICROZIDE) 12.5 MG capsule TAKE ONE CAPSULE BY MOUTH ONCE DAILY   insulin glargine (LANTUS SOLOSTAR) 100 UNIT/ML Solostar Pen Inject 46 Units into the skin daily. Dx E11.9   Insulin Pen Needle (PEN NEEDLES) 31G X 5 MM MISC Use daily with insulin pen.   Lancets (ONETOUCH ULTRASOFT) lancets Test once daily Dx 250.00   Melatonin 5 MG TABS Take 5 mg by mouth at bedtime as needed.   metoprolol succinate (TOPROL-XL) 50 MG 24 hr tablet Take 1 tablet (50 mg total) by mouth daily. Take with or immediately following a meal.   mirtazapine (REMERON) 7.5 MG tablet Take 1 tablet (7.5 mg total) by mouth at bedtime.   Multiple Vitamin (MULTIVITAMIN) tablet Take 1 tablet by mouth daily. breakfast   pantoprazole (PROTONIX) 40 MG tablet TAKE 1 TABLET BY MOUTH EVERY DAY    pravastatin (PRAVACHOL) 20 MG tablet TAKE 1 TABLET AT BEDTIME   tamsulosin (FLOMAX) 0.4 MG CAPS capsule Take 1 capsule (0.4 mg total) by mouth at bedtime.   vitamin B-12 (CYANOCOBALAMIN) 250 MCG tablet Take 250 mcg by mouth 2 (two) times daily. 1 in the morning and 1 at bedtime   No facility-administered encounter medications on file as of 05/31/2021.    BP Readings from Last 3 Encounters:  03/09/21 126/60  02/15/21 132/80  12/08/20 134/70    Lab Results  Component Value Date   HGBA1C 8.5 (H) 03/09/2021      No OVs, Consults, or hospital visits since last care coordination call / Pharmacist visit. No medication changes indicated   Last adherence delivery date:03/13/21      Patient is due for next adherence delivery on: 06/11/21  Spoke with patient on 06/04/21 reviewed medications and coordinated delivery. Spoke with [name] the son.  This delivery to include: Adherence Packaging  90 Days  Packs: OTC aspirin 81 mg - 1 tablet daily at bedtime OTC ferrous sulfate 325 mg - 1 tablet every morning OTC Mens Multivitamin 50 + - 1 tablet every morning Glyburide 5 mg - 2 tablets daily with breakfast Pravastatin 69m 1 tablet at bedtime Tamsulosin 0.4359m 1 tablet at bedtime Finasteride 59m22m1 tablet at bedtime Mirtazapine 7.59mg69mtablet at bedtime Metoprolol succinate ER 50 mg - 1 tablet at  breakfast HCTZ 12.81m - 1 tablet at breakfast Pantoprazole 462m- take 1 tablet at Breakfast   VIAL medications: Lantus inj solostar inject 46 units once daily One touch ultrasoft lancets Triamcinolone Acetonide cream 10% Pen Needles 31 X 5/16 Test Strips  Patient declined the following medications this month: None  Any concerns about your medications? No  How often do you forget or accidentally miss a dose? Never  Is patient in packaging Yes  If yes  What is the date on your next pill pack? N/A  Any concerns or issues with your packaging? No    Refills requested from providers include:  HCTZ, Glyburide  Confirmed delivery date of 06/04/21, advised patient that pharmacy will contact them the morning of delivery.  No readings available  MiDebbora DusCPP notified  VeAvel SensorCCSnoqualmiessistant [phone] have reviewed the care management and care coordination activities outlined in this encounter and I am certifying that I agree with the content of this note. No further action required.  MiDebbora DusPharmD Clinical Pharmacist [facility] at [phone]

## 2021-06-08 ENCOUNTER — Other Ambulatory Visit: Payer: Self-pay | Admitting: Family Medicine

## 2021-06-15 ENCOUNTER — Ambulatory Visit (INDEPENDENT_AMBULATORY_CARE_PROVIDER_SITE_OTHER): Payer: Medicare Other | Admitting: Family Medicine

## 2021-06-15 ENCOUNTER — Other Ambulatory Visit: Payer: Self-pay

## 2021-06-15 ENCOUNTER — Encounter: Payer: Self-pay | Admitting: Family Medicine

## 2021-06-15 VITALS — BP 124/80 | HR 63 | Temp 98.1°F | Ht 68.0 in | Wt 184.0 lb

## 2021-06-15 DIAGNOSIS — E119 Type 2 diabetes mellitus without complications: Secondary | ICD-10-CM

## 2021-06-15 DIAGNOSIS — R413 Other amnesia: Secondary | ICD-10-CM

## 2021-06-15 DIAGNOSIS — Z23 Encounter for immunization: Secondary | ICD-10-CM | POA: Diagnosis not present

## 2021-06-15 LAB — POCT GLYCOSYLATED HEMOGLOBIN (HGB A1C): Hemoglobin A1C: 7.8 % — AB (ref 4.0–5.6)

## 2021-06-15 NOTE — Progress Notes (Signed)
This visit occurred during the SARS-CoV-2 public health emergency.  Safety protocols were in place, including screening questions prior to the visit, additional usage of staff PPE, and extensive cleaning of exam room while observing appropriate contact time as indicated for disinfecting solutions.  Diabetes:  Using medications without difficulties: yes Hypoglycemic episodes: no Hyperglycemic episodes: no Feet problems: no tingling.  No ulceration.   Blood Sugars averaging: 100-120s. Occ higher.   eye exam within last year: yes A1c d/w pt, improved from prior.    HOH at baseline, still using hearing aids, d/w pt.  Here today with family.  His wife had memory troubles at baseline, with some some days better than others.    We talked about possible donepezil use.  Not on med yet. Pt and family haven't noted acute changes.  He has trouble with names but not with family or close acquaintances.  I asked him and family to consider med use.    Meds, vitals, and allergies reviewed.   ROS: Per HPI unless specifically indicated in ROS section   GEN: nad, alert and oriented to person but has difficulty with short-term recall and occasionally repeats himself. HEENT: ncat NECK: supple w/o LA CV: rrr. PULM: ctab, no inc wob ABD: soft, +bs EXT: no edema SKIN: Well-perfused.  30 minutes were devoted to patient care in this encounter (this includes time spent reviewing the patient's file/history, interviewing and examining the patient, counseling/reviewing plan with patient).

## 2021-06-15 NOTE — Patient Instructions (Addendum)
Consider donepezil use for memory.  Take care.  Glad to see you. Don't change your diabetes meds for now and plan on a yearly visit in around Feb of 2023.

## 2021-06-17 NOTE — Assessment & Plan Note (Signed)
A1c improved.  Continue glyburide and Lantus.  I do not want to induce hypoglycemia.  Given his improvement in A1c I think it makes sense not to change his medications and recheck his A1c later on.  See after visit summary.

## 2021-06-17 NOTE — Assessment & Plan Note (Signed)
We talked about possible donepezil use.  Not on med yet. Pt and family haven't noted acute changes.  He has trouble with names but not with family or close acquaintances.  I asked him and family to consider med use.

## 2021-06-26 ENCOUNTER — Telehealth: Payer: Self-pay

## 2021-06-26 NOTE — Chronic Care Management (AMB) (Signed)
    Chronic Care Management Pharmacy Assistant   Name: Paul Terrell  MRN: [id number] DOB: 23-Jan-1931   Reason for Encounter: Reminder Call   Conditions to be addressed/monitored: CAD, HTN, HLD, and DMII    Medications: Outpatient Encounter Medications as of 06/26/2021  Medication Sig   acetaminophen (TYLENOL) 500 MG tablet Take 500 mg by mouth every 6 (six) hours as needed (for pain or headaches).   aspirin 81 MG tablet Take 81 mg by mouth at bedtime.   blood glucose meter kit and supplies Dispense Accu-chek meter. Check sugar once daily. DX E11.9   Cholecalciferol (VITAMIN D-3) 5000 UNITS TABS Take 5,000 Units by mouth daily. breakfast   ferrous sulfate 325 (65 FE) MG tablet Take 1 tablet (325 mg total) by mouth daily with breakfast.   finasteride (PROSCAR) 5 MG tablet Take 1 tablet (5 mg total) by mouth daily.   glucose blood test strip Use up to 3 times a day.  Insulin treated DM2.  For Verio meter   glyBURIDE (DIABETA) 5 MG tablet TAKE TWO TABLETS BY MOUTH ONCE DAILY WITH BREAKFAST   hydrochlorothiazide (MICROZIDE) 12.5 MG capsule TAKE ONE CAPSULE BY MOUTH ONCE DAILY   insulin glargine (LANTUS SOLOSTAR) 100 UNIT/ML Solostar Pen Inject 46 Units into the skin daily. Dx E11.9   Insulin Pen Needle (PEN NEEDLES) 31G X 5 MM MISC Use daily with insulin pen.   Lancets (ONETOUCH ULTRASOFT) lancets Test once daily Dx 250.00   Melatonin 5 MG TABS Take 5 mg by mouth at bedtime as needed.   metoprolol succinate (TOPROL-XL) 50 MG 24 hr tablet Take 1 tablet (50 mg total) by mouth daily. Take with or immediately following a meal.   mirtazapine (REMERON) 7.5 MG tablet Take 1 tablet (7.5 mg total) by mouth at bedtime.   Multiple Vitamin (MULTIVITAMIN) tablet Take 1 tablet by mouth daily. breakfast   pantoprazole (PROTONIX) 40 MG tablet TAKE 1 TABLET BY MOUTH EVERY DAY   pravastatin (PRAVACHOL) 20 MG tablet TAKE 1 TABLET AT BEDTIME   tamsulosin (FLOMAX) 0.4 MG CAPS capsule Take 1 capsule (0.4 mg  total) by mouth at bedtime.   vitamin B-12 (CYANOCOBALAMIN) 250 MCG tablet Take 250 mcg by mouth 2 (two) times daily. 1 in the morning and 1 at bedtime   No facility-administered encounter medications on file as of 06/26/2021.   [name]  did not answer the telephone to remind him of his upcoming telephone visit with Debbora Dus on 07/02/21 at 9:00am. Patient was reminded to have all medications, supplements and any blood glucose and blood pressure readings available for review at appointment. Detailed message left on machine    [facility] Rating Drugs: Medication:  Last Fill: Day Supply Glyburide $RemoveBeforeDE'5mg'[id number]$   06/11/21 90 Lantus   06/11/21 26 Pravastatin $RemoveBefore'20mg'[id number]$  06/06/21 [place], CPP notified  Avel Sensor, [place] Assistant [phone]  Total time spent for month CPA: 10 min

## 2021-07-02 ENCOUNTER — Telehealth: Payer: Medicare Other

## 2021-07-02 ENCOUNTER — Telehealth: Payer: Self-pay

## 2021-07-02 DIAGNOSIS — R413 Other amnesia: Secondary | ICD-10-CM

## 2021-07-02 DIAGNOSIS — M899 Disorder of bone, unspecified: Secondary | ICD-10-CM

## 2021-07-02 DIAGNOSIS — D649 Anemia, unspecified: Secondary | ICD-10-CM

## 2021-07-02 DIAGNOSIS — E119 Type 2 diabetes mellitus without complications: Secondary | ICD-10-CM

## 2021-07-02 NOTE — Telephone Encounter (Signed)
Contacted patient's son [name] to check in. Reports patient is doing well. Reviewed medications. All meds in pill packs except - vitamin D, vitamin B12, and melatonin. He takes melatonin PRN. He is not taking vitamin D 5000 IU or vitamin B12. His last B12 was elevated so we will discontinue vitamin B12 (pt not taking). He last Vitamin D was within normal range (63 05/2020), on 5000 IU daily.  Son reports patient may have had some upset stomach with the D, so they stopped taking. Will stay off this for now (pt not taking). He will continue his daily multivitamin.  Reports his BG are running good. Fasting this week - 104, 144, 152. No hypoglycemia. BP on home checks this week, 153/67, 133/72, 160/76, 156/72. Denies any abnormal s/s or concerns.  Plan:  -Continue to follow home BP and BG logs -Recommend update vitamin D3 at next PCP visit -CCM follow up 6 months   Debbora Dus, PharmD Clinical Pharmacist [facility] [phone]

## 2021-07-03 NOTE — Telephone Encounter (Signed)
Noted.  Orders placed for f/u labs.  Thanks.

## 2021-08-21 DIAGNOSIS — Z961 Presence of intraocular lens: Secondary | ICD-10-CM | POA: Diagnosis not present

## 2021-08-21 DIAGNOSIS — H353132 Nonexudative age-related macular degeneration, bilateral, intermediate dry stage: Secondary | ICD-10-CM | POA: Diagnosis not present

## 2021-08-21 DIAGNOSIS — H52203 Unspecified astigmatism, bilateral: Secondary | ICD-10-CM | POA: Diagnosis not present

## 2021-08-21 DIAGNOSIS — E119 Type 2 diabetes mellitus without complications: Secondary | ICD-10-CM | POA: Diagnosis not present

## 2021-08-21 LAB — HM DIABETES EYE EXAM

## 2021-08-25 ENCOUNTER — Emergency Department (HOSPITAL_COMMUNITY)
Admission: EM | Admit: 2021-08-25 | Discharge: 2021-08-26 | Disposition: A | Discharge status: Home / Self Care | Payer: Medicare Other | Attending: Emergency Medicine | Admitting: Emergency Medicine

## 2021-08-25 ENCOUNTER — Other Ambulatory Visit: Payer: Self-pay

## 2021-08-25 ENCOUNTER — Ambulatory Visit
Admission: EM | Admit: 2021-08-25 | Discharge: 2021-08-25 | Disposition: A | Discharge status: Home / Self Care | Payer: Medicare Other

## 2021-08-25 ENCOUNTER — Emergency Department (HOSPITAL_COMMUNITY): Payer: Medicare Other

## 2021-08-25 ENCOUNTER — Encounter (HOSPITAL_COMMUNITY): Payer: Self-pay | Admitting: Emergency Medicine

## 2021-08-25 DIAGNOSIS — L03031 Cellulitis of right toe: Secondary | ICD-10-CM | POA: Diagnosis not present

## 2021-08-25 DIAGNOSIS — T148XXA Other injury of unspecified body region, initial encounter: Secondary | ICD-10-CM

## 2021-08-25 DIAGNOSIS — I2510 Atherosclerotic heart disease of native coronary artery without angina pectoris: Secondary | ICD-10-CM | POA: Insufficient documentation

## 2021-08-25 DIAGNOSIS — Z85828 Personal history of other malignant neoplasm of skin: Secondary | ICD-10-CM | POA: Insufficient documentation

## 2021-08-25 DIAGNOSIS — Z794 Long term (current) use of insulin: Secondary | ICD-10-CM | POA: Insufficient documentation

## 2021-08-25 DIAGNOSIS — Z7984 Long term (current) use of oral hypoglycemic drugs: Secondary | ICD-10-CM | POA: Diagnosis not present

## 2021-08-25 DIAGNOSIS — M7989 Other specified soft tissue disorders: Secondary | ICD-10-CM | POA: Diagnosis present

## 2021-08-25 DIAGNOSIS — Z79899 Other long term (current) drug therapy: Secondary | ICD-10-CM | POA: Diagnosis not present

## 2021-08-25 DIAGNOSIS — E119 Type 2 diabetes mellitus without complications: Secondary | ICD-10-CM | POA: Diagnosis not present

## 2021-08-25 DIAGNOSIS — Z87891 Personal history of nicotine dependence: Secondary | ICD-10-CM | POA: Insufficient documentation

## 2021-08-25 DIAGNOSIS — S91101A Unspecified open wound of right great toe without damage to nail, initial encounter: Secondary | ICD-10-CM | POA: Diagnosis not present

## 2021-08-25 DIAGNOSIS — M19071 Primary osteoarthritis, right ankle and foot: Secondary | ICD-10-CM | POA: Diagnosis not present

## 2021-08-25 DIAGNOSIS — I119 Hypertensive heart disease without heart failure: Secondary | ICD-10-CM | POA: Insufficient documentation

## 2021-08-25 DIAGNOSIS — Z7982 Long term (current) use of aspirin: Secondary | ICD-10-CM | POA: Insufficient documentation

## 2021-08-25 LAB — COMPREHENSIVE METABOLIC PANEL
ALT: 36 U/L (ref 0–44)
AST: 31 U/L (ref 15–41)
Albumin: 3.9 g/dL (ref 3.5–5.0)
Alkaline Phosphatase: 77 U/L (ref 38–126)
Anion gap: 8 (ref 5–15)
BUN: 15 mg/dL (ref 8–23)
CO2: 25 mmol/L (ref 22–32)
Calcium: 8.9 mg/dL (ref 8.9–10.3)
Chloride: 101 mmol/L (ref 98–111)
Creatinine, Ser: 1.21 mg/dL (ref 0.61–1.24)
GFR, Estimated: 58 mL/min — ABNORMAL LOW (ref >60)
Glucose, Bld: 282 mg/dL — ABNORMAL HIGH (ref 70–99)
Potassium: 3.6 mmol/L (ref 3.5–5.1)
Sodium: 134 mmol/L — ABNORMAL LOW (ref 135–145)
Total Bilirubin: 0.9 mg/dL (ref 0.3–1.2)
Total Protein: 6.8 g/dL (ref 6.5–8.1)

## 2021-08-25 LAB — CBC WITH DIFFERENTIAL/PLATELET
Abs Immature Granulocytes: 0.03 10*3/uL (ref 0.00–0.07)
Basophils Absolute: 0.1 10*3/uL (ref 0.0–0.1)
Basophils Relative: 1 %
Eosinophils Absolute: 0.2 10*3/uL (ref 0.0–0.5)
Eosinophils Relative: 2 %
HCT: 45.9 % (ref 39.0–52.0)
Hemoglobin: 15.8 g/dL (ref 13.0–17.0)
Immature Granulocytes: 0 %
Lymphocytes Relative: 27 %
Lymphs Abs: 2.2 10*3/uL (ref 0.7–4.0)
MCH: 33.2 pg (ref 26.0–34.0)
MCHC: 34.4 g/dL (ref 30.0–36.0)
MCV: 96.4 fL (ref 80.0–100.0)
Monocytes Absolute: 0.6 10*3/uL (ref 0.1–1.0)
Monocytes Relative: 7 %
Neutro Abs: 5 10*3/uL (ref 1.7–7.7)
Neutrophils Relative %: 63 %
Platelets: 155 10*3/uL (ref 150–400)
RBC: 4.76 MIL/uL (ref 4.22–5.81)
RDW: 13.9 % (ref 11.5–15.5)
WBC: 8 10*3/uL (ref 4.0–10.5)
nRBC: 0 % (ref 0.0–0.2)

## 2021-08-25 LAB — LACTIC ACID, PLASMA: Lactic Acid, Venous: 1.8 mmol/L (ref 0.5–1.9)

## 2021-08-25 NOTE — ED Provider Notes (Signed)
He doesEmergency Medicine Provider Triage Evaluation Note  Paul Terrell , a 87 y.o. male  was evaluated in triage.  Pt complains of swelling of his right second toe.  The patient states that this has been there for a couple of days, but the family believes it may be longer.  He does have pain in this toe.  He denies any fevers or chills.  Does have a history of diabetes.  He was sent here by urgent care for an MRI to evaluate for osteomyelitis  Review of Systems  Positive: See above Negative:   Physical Exam  BP (!) 160/70 (BP Location: Left Arm)   Pulse 64   Temp 97.8 F (36.6 C)   Resp 16   SpO2 99%  Gen:   Awake, no distress   Resp:  Normal effort  MSK:   Moves extremities without difficulty  Other:  See photo below for toe picture    Medical Decision Making  Medically screening exam initiated at 4:49 PM.  Appropriate orders placed.  [name] was informed that the remainder of the evaluation will be completed by another provider, this initial triage assessment does not replace that evaluation, and the importance of remaining in the ED until their evaluation is complete.     Sheila Oats 08/25/21 1650    Carmin Muskrat, MD 08/25/21 [phone]

## 2021-08-25 NOTE — ED Provider Notes (Signed)
[facility]    CSN: [id number] Arrival date & time: 08/25/21  1131      History   Chief Complaint Chief Complaint  Patient presents with   right foot ulcer    HPI Paul Terrell is a 87 y.o. male.   Patient presents with redness and inflammation to right second toe.  Patient reports that he noticed it yesterday.  Denies any apparent injury to the toe.  Patient does have history of diabetes as well as high blood pressure.  Patient had issue with skin infection of the right great toe approximately 6 months ago that has now healed completely.  Patient does report some numbness in the toe.  Denies any tingling.  Patient is followed by podiatry due to chronic ulcers of feet.  Patient also has elevated blood pressure reading in urgent care today and states that he has taken his blood pressure medication as prescribed.  Patient does not report any fevers, body aches, chills.  Denies any chest pain, shortness of breath, headache, blurred vision, dizziness, nausea, vomiting.    Past Medical History:  Diagnosis Date   Arthritis    Basal cell carcinoma of scalp 12/16/07   Reexcision (Dr. Merril Abbe. Teressa Senter)   BPH (benign prostatic hyperplasia)    CAD (coronary artery disease) 11/01   stent placed   Cataract    Diabetes mellitus type II 11/01   GERD (gastroesophageal reflux disease)    occasionally   Hearing aid worn    HOH (hard of hearing)    Hyperlipemia 11/01   Hypertension 11/01   Ruptured disc, cervical    Neck C7 (Dr. Pearlie Oyster)    Patient Active Problem List   Diagnosis Date Noted   Hearing loss 02/19/2021   Risk for falls 11/22/2020   Memory change 11/22/2020   Compression fracture of L1 lumbar vertebra ([place]) 05/08/2020   Lower back pain 04/23/2020   Aortic aneurysm ([place]) 10/20/2019   GERD (gastroesophageal reflux disease)    Thrombocytopenia ([place])    Anemia 05/14/2018   Hypertension 05/14/2018   Unsteadiness on feet 11/05/2017   Spinal stenosis of  lumbar region 11/08/2015   Advance care planning 11/04/2014   Leg pain 01/30/2014   Medicare annual wellness visit, subsequent 11/01/2013   BPH (benign prostatic hyperplasia)    Diabetes mellitus, type II ([place])    Hyperlipidemia    Hypertensive heart disease    CAD (coronary artery disease)     Past Surgical History:  Procedure Laterality Date   BASAL CELL CARCINOMA EXCISION     BRONCHIAL BRUSHINGS  05/16/2018   Procedure: ESOPHAGEAL BRUSHINGS;  Surgeon: Irving Copas., MD;  Location: [place];  Service: Gastroenterology;;   Lillard Anes  02/2000   Dr. Janus Molder - Right   CERVICAL FUSION     COLONOSCOPY     2012   COLONOSCOPY N/A 05/17/2018   Procedure: COLONOSCOPY;  Surgeon: Irving Copas., MD;  Location: [facility] ENDOSCOPY;  Service: Gastroenterology;  Laterality: N/A;   ESOPHAGOGASTRODUODENOSCOPY (EGD) WITH PROPOFOL N/A 05/16/2018   Procedure: ESOPHAGOGASTRODUODENOSCOPY (EGD) WITH PROPOFOL ;  Surgeon: Rush Landmark Telford Nab., MD;  Location: [place];  Service: Gastroenterology;  Laterality: N/A;   HOT HEMOSTASIS N/A 05/16/2018   Procedure: HOT HEMOSTASIS (ARGON PLASMA COAGULATION/BICAP);  Surgeon: Irving Copas., MD;  Location: [place];  Service: Gastroenterology;  Laterality: N/A;   KNEE ARTHROSCOPY  09/1999   Right   LACERATION REPAIR  09/1999   Right Hand (Dr. Fredna Dow)   [place]  Dr. Elyse Jarvis   POLYPECTOMY  05/17/2018   Procedure: POLYPECTOMY;  Surgeon: Irving Copas., MD;  Location: [place];  Service: Gastroenterology;;   ROTATOR CUFF REPAIR  02/05   Left       Home Medications    Prior to Admission medications   Medication Sig Start Date End Date Taking? Authorizing Provider  acetaminophen (TYLENOL) 500 MG tablet Take 500 mg by mouth every 6 (six) hours as needed (for pain or headaches).    [provider]  aspirin 81 MG tablet Take 81 mg by mouth at bedtime.    [provider]  blood glucose  meter kit and supplies Dispense Accu-chek meter. Check sugar once daily. DX E11.9 10/07/19   Tonia Ghent, MD  ferrous sulfate 325 (65 FE) MG tablet Take 1 tablet (325 mg total) by mouth daily with breakfast. 11/19/18   Tonia Ghent, MD  finasteride (PROSCAR) 5 MG tablet Take 1 tablet (5 mg total) by mouth daily. 03/12/21   Tonia Ghent, MD  glucose blood test strip Use up to 3 times a day.  Insulin treated DM2.  For Verio meter 10/17/20   Tonia Ghent, MD  glyBURIDE (DIABETA) 5 MG tablet TAKE TWO TABLETS BY MOUTH ONCE DAILY WITH BREAKFAST 06/08/21   Tonia Ghent, MD  hydrochlorothiazide (MICROZIDE) 12.5 MG capsule TAKE ONE CAPSULE BY MOUTH ONCE DAILY 05/30/21   Croitoru, Mihai, MD  insulin glargine (LANTUS SOLOSTAR) 100 UNIT/ML Solostar Pen Inject 46 Units into the skin daily. Dx E11.9 02/20/21   Tonia Ghent, MD  Insulin Pen Needle (PEN NEEDLES) 31G X 5 MM MISC Use daily with insulin pen. 03/12/21   Tonia Ghent, MD  Lancets [facility] ULTRASOFT) lancets Test once daily Dx 250.00 12/19/20   Tonia Ghent, MD  Melatonin 5 MG TABS Take 5 mg by mouth at bedtime as needed.    [provider]  metoprolol succinate (TOPROL-XL) 50 MG 24 hr tablet Take 1 tablet (50 mg total) by mouth daily. Take with or immediately following a meal. 12/12/20   Tonia Ghent, MD  mirtazapine (REMERON) 7.5 MG tablet Take 1 tablet (7.5 mg total) by mouth at bedtime. 03/12/21   Tonia Ghent, MD  Multiple Vitamin (MULTIVITAMIN) tablet Take 1 tablet by mouth daily. breakfast    [provider]  pantoprazole (PROTONIX) 40 MG tablet TAKE 1 TABLET BY MOUTH EVERY DAY 02/13/21   Tonia Ghent, MD  pravastatin (PRAVACHOL) 20 MG tablet TAKE 1 TABLET AT BEDTIME 07/25/20   Tonia Ghent, MD  tamsulosin (FLOMAX) 0.4 MG CAPS capsule Take 1 capsule (0.4 mg total) by mouth at bedtime. 12/12/20   Tonia Ghent, MD    Family History Family History  Problem Relation Age of Onset   Stroke  Mother        Lived 2 years   Heart disease Mother        CAD, Angioplasty X 2   Hypertension Mother    Heart disease Father        MI   Hypertension Father    Hypertension Sister    Hypertension Brother    Heart disease Brother        MI   Hyperlipidemia Brother    Heart disease Brother        MI    Social History Social History   Tobacco Use   Smoking status: Former    Packs/day: 1.00    Years: 25.00  Pack years: 25.00    Types: Cigarettes    Quit date: 01/09/1976    Years since quitting: 45.6   Smokeless tobacco: Former    Types: Chew   Tobacco comments:    quit smoking about 25 years ago  Vaping Use   Vaping Use: Never used  Substance Use Topics   Alcohol use: No   Drug use: No     Allergies   Nsaids, Citrus, Lisinopril, Metformin and related, and Zocor [simvastatin]   Review of Systems Review of Systems Per HPI  Physical Exam Triage Vital Signs ED Triage Vitals  Enc Vitals Group     BP 08/25/21 1325 (!) 171/80     Pulse Rate 08/25/21 1325 65     Resp 08/25/21 1325 18     Temp 08/25/21 1325 (!) 97.3 F (36.3 C)     Temp Source 08/25/21 1325 Oral     SpO2 08/25/21 1325 96 %     Weight --      Height --      Head Circumference --      Peak Flow --      Pain Score 08/25/21 1324 0     Pain Loc --      Pain Edu? --      Excl. in [place]? --    No data found.  Updated Vital Signs BP (!) 171/80 (BP Location: Left Arm)   Pulse 65   Temp (!) 97.3 F (36.3 C) (Oral)   Resp 18   SpO2 96%   Visual Acuity Right Eye Distance:   Left Eye Distance:   Bilateral Distance:    Right Eye Near:   Left Eye Near:    Bilateral Near:     Physical Exam Constitutional:      General: He is not in acute distress.    Appearance: Normal appearance. He is not toxic-appearing or diaphoretic.  HENT:     Head: Normocephalic and atraumatic.  Eyes:     Extraocular Movements: Extraocular movements intact.     Conjunctiva/sclera: Conjunctivae normal.   Pulmonary:     Effort: Pulmonary effort is normal.  Feet:     Right foot:     Skin integrity: Ulcer, erythema and warmth present.     Comments: Patient has erythema, swelling, light yellow discoloration present to right second toe from middle phalanx to distal phalanx.  Normal pedal pulses.  Neurovascular intact.  No drainage noted.  Missing half of toenail.  Toenail is yellow in discoloration. Skin:    General: Skin is warm and dry.     Findings: Erythema present.  Neurological:     General: No focal deficit present.     Mental Status: He is alert and oriented to person, place, and time. Mental status is at baseline.  Psychiatric:        Mood and Affect: Mood normal.        Behavior: Behavior normal.        Thought Content: Thought content normal.        Judgment: Judgment normal.     UC Treatments / Results  Labs (all labs ordered are listed, but only abnormal results are displayed) Labs Reviewed - No data to display  EKG   Radiology No results found.  Procedures Procedures (including critical care time)  Medications Ordered in UC Medications - No data to display  Initial Impression / Assessment and Plan / UC Course  I have reviewed the triage vital signs and the nursing  notes.  Pertinent labs & imaging results that were available during my care of the patient were reviewed by me and considered in my medical decision making (see chart for details).     Patient has pretty severe infection of the right second toe.  Patient does have other risk factors including diabetes and hypertension with elevated blood pressure reading in urgent care today that could contribute to delayed healing of toe.  Patient is followed by podiatry.  Called on-call podiatry (Dr. Posey Pronto) to discuss patient's clinical course.  Dr. Posey Pronto suggested MRI and evaluation at the emergency department as patient may need surgical treatment.  Advised patient and caregiver that he will need to go to the  hospital for further evaluation and management as MRI is not available in urgent care.  Patient and caregiver voiced understanding and was agreeable with plan.  Vital signs fairly stable at discharge.  Agree with patient self transport to the hospital. Final Clinical Impressions(s) / UC Diagnoses   Final diagnoses:  Cellulitis of second toe of right foot     Discharge Instructions      Please go to the hospital as soon as you leave urgent care for further evaluation and management.    ED Prescriptions   None    PDMP not reviewed this encounter.   Teodora Medici, [place] 08/25/21 1414

## 2021-08-25 NOTE — Discharge Instructions (Signed)
Please go to the hospital as soon as you leave urgent care for further evaluation and management. 

## 2021-08-25 NOTE — ED Triage Notes (Signed)
Pt reports infection to R 2nd toe for a few days but family believes it may be longer.  Reports intermittent pain.  Denies fever and chills.

## 2021-08-25 NOTE — ED Triage Notes (Signed)
Pt c/o non-healing foot ulcer on 2nd right digit. Pt extremely hard of hearing. Son is providing most of the hx. Son states he was first on notice of the ulcer today though the ulcer has clearly been progressing beyond today. Pt is diabetic and has PCP and foot specialist.

## 2021-08-26 MED ORDER — CEPHALEXIN 250 MG PO CAPS
500.0000 mg | ORAL_CAPSULE | Freq: Once | ORAL | Status: AC
  Administered 2021-08-26: 02:00:00 500 mg via ORAL
  Filled 2021-08-26: qty 2

## 2021-08-26 MED ORDER — CEPHALEXIN 500 MG PO CAPS: 500.0000 mg | ORAL_CAPSULE | Freq: Three times a day (TID) | ORAL | 0 refills | Status: AC

## 2021-08-26 MED ORDER — ACETAMINOPHEN 500 MG PO TABS
1000.0000 mg | ORAL_TABLET | Freq: Once | ORAL | Status: AC
  Administered 2021-08-26: 02:00:00 1000 mg via ORAL
  Filled 2021-08-26: qty 2

## 2021-08-26 NOTE — ED Notes (Signed)
Patient verbalizes understanding of d/c instructions. Opportunities for questions and answers were provided. Pt d/c from ED to lobby in wheelchair. Son picking pt up.

## 2021-08-26 NOTE — ED Provider Notes (Signed)
[facility] EMERGENCY DEPARTMENT Provider Note  CSN: [id number] Arrival date & time: 08/25/21 1518  Chief Complaint(s) Toe Pain (Toe infection)  HPI Paul Terrell is a 87 y.o. male with a past medical history listed below including diabetes who presents to the emergency department with several days of right second toe wound and redness with associated discomfort.  Patient denies any trauma.  Patient has chronic peripheral neuropathy from diabetes.  Denies any acute changes.  Denies any fevers or chills.  No coughing or congestion.  No difficulty ambulating.  He went to urgent care where he was evaluated.  The provider there consulted the podiatrist on-call, Dr. Posey Pronto who based on the history recommended ED evaluation for possible MRI to assess for osteomyelitis.  The history is provided by the patient and a relative.   Past Medical History Past Medical History:  Diagnosis Date   Arthritis    Basal cell carcinoma of scalp 12/16/07   Reexcision (Dr. Merril Abbe. Teressa Senter)   BPH (benign prostatic hyperplasia)    CAD (coronary artery disease) 11/01   stent placed   Cataract    Diabetes mellitus type II 11/01   GERD (gastroesophageal reflux disease)    occasionally   Hearing aid worn    HOH (hard of hearing)    Hyperlipemia 11/01   Hypertension 11/01   Ruptured disc, cervical    Neck C7 (Dr. Pearlie Oyster)   Patient Active Problem List   Diagnosis Date Noted   Hearing loss 02/19/2021   Risk for falls 11/22/2020   Memory change 11/22/2020   Compression fracture of L1 lumbar vertebra ([place]) 05/08/2020   Lower back pain 04/23/2020   Aortic aneurysm ([place]) 10/20/2019   GERD (gastroesophageal reflux disease)    Thrombocytopenia ([place])    Anemia 05/14/2018   Hypertension 05/14/2018   Unsteadiness on feet 11/05/2017   Spinal stenosis of lumbar region 11/08/2015   Advance care planning 11/04/2014   Leg pain 01/30/2014   Medicare annual wellness visit, subsequent 11/01/2013    BPH (benign prostatic hyperplasia)    Diabetes mellitus, type II ([place])    Hyperlipidemia    Hypertensive heart disease    CAD (coronary artery disease)    Home Medication(s) Prior to Admission medications   Medication Sig Start Date End Date Taking? Authorizing Provider  cephALEXin (KEFLEX) 500 MG capsule Take 1 capsule (500 mg total) by mouth 3 (three) times daily for 10 days. 08/26/21 09/05/21 Yes Alejos Reinhardt, Grayce Sessions, MD  acetaminophen (TYLENOL) 500 MG tablet Take 500 mg by mouth every 6 (six) hours as needed (for pain or headaches).    [provider]  aspirin 81 MG tablet Take 81 mg by mouth at bedtime.    [provider]  blood glucose meter kit and supplies Dispense Accu-chek meter. Check sugar once daily. DX E11.9 10/07/19   Tonia Ghent, MD  ferrous sulfate 325 (65 FE) MG tablet Take 1 tablet (325 mg total) by mouth daily with breakfast. 11/19/18   Tonia Ghent, MD  finasteride (PROSCAR) 5 MG tablet Take 1 tablet (5 mg total) by mouth daily. 03/12/21   Tonia Ghent, MD  glucose blood test strip Use up to 3 times a day.  Insulin treated DM2.  For Verio meter 10/17/20   Tonia Ghent, MD  glyBURIDE (DIABETA) 5 MG tablet TAKE TWO TABLETS BY MOUTH ONCE DAILY WITH BREAKFAST 06/08/21   Tonia Ghent, MD  hydrochlorothiazide (MICROZIDE) 12.5 MG capsule TAKE ONE CAPSULE BY MOUTH  ONCE DAILY 05/30/21   Croitoru, Mihai, MD  insulin glargine (LANTUS SOLOSTAR) 100 UNIT/ML Solostar Pen Inject 46 Units into the skin daily. Dx E11.9 02/20/21   Tonia Ghent, MD  Insulin Pen Needle (PEN NEEDLES) 31G X 5 MM MISC Use daily with insulin pen. 03/12/21   Tonia Ghent, MD  Lancets [employer] ULTRASOFT) lancets Test once daily Dx 250.00 12/19/20   Tonia Ghent, MD  Melatonin 5 MG TABS Take 5 mg by mouth at bedtime as needed.    [provider]  metoprolol succinate (TOPROL-XL) 50 MG 24 hr tablet Take 1 tablet (50 mg total) by mouth daily. Take with or  immediately following a meal. 12/12/20   Tonia Ghent, MD  mirtazapine (REMERON) 7.5 MG tablet Take 1 tablet (7.5 mg total) by mouth at bedtime. 03/12/21   Tonia Ghent, MD  Multiple Vitamin (MULTIVITAMIN) tablet Take 1 tablet by mouth daily. breakfast    [provider]  pantoprazole (PROTONIX) 40 MG tablet TAKE 1 TABLET BY MOUTH EVERY DAY 02/13/21   Tonia Ghent, MD  pravastatin (PRAVACHOL) 20 MG tablet TAKE 1 TABLET AT BEDTIME 07/25/20   Tonia Ghent, MD  tamsulosin (FLOMAX) 0.4 MG CAPS capsule Take 1 capsule (0.4 mg total) by mouth at bedtime. 12/12/20   Tonia Ghent, MD                                                                                                                                    Past Surgical History Past Surgical History:  Procedure Laterality Date   BASAL CELL CARCINOMA EXCISION     BRONCHIAL BRUSHINGS  05/16/2018   Procedure: ESOPHAGEAL BRUSHINGS;  Surgeon: Irving Copas., MD;  Location: [place];  Service: Gastroenterology;;   Lillard Anes  02/2000   Dr. Janus Molder - Right   CERVICAL FUSION     COLONOSCOPY     2012   COLONOSCOPY N/A 05/17/2018   Procedure: COLONOSCOPY;  Surgeon: Irving Copas., MD;  Location: [place];  Service: Gastroenterology;  Laterality: N/A;   ESOPHAGOGASTRODUODENOSCOPY (EGD) WITH PROPOFOL N/A 05/16/2018   Procedure: ESOPHAGOGASTRODUODENOSCOPY (EGD) WITH PROPOFOL ;  Surgeon: Rush Landmark Telford Nab., MD;  Location: [place];  Service: Gastroenterology;  Laterality: N/A;   HOT HEMOSTASIS N/A 05/16/2018   Procedure: HOT HEMOSTASIS (ARGON PLASMA COAGULATION/BICAP);  Surgeon: Irving Copas., MD;  Location: [place];  Service: Gastroenterology;  Laterality: N/A;   KNEE ARTHROSCOPY  09/1999   Right   LACERATION REPAIR  09/1999   Right Hand (Dr. Fredna Dow)   LITHOTRIPSY  1991   Dr. Elyse Jarvis   POLYPECTOMY  05/17/2018   Procedure: POLYPECTOMY;  Surgeon: Rush Landmark Telford Nab., MD;   Location: [place];  Service: Gastroenterology;;   ROTATOR CUFF REPAIR  02/05   Left   Family History Family History  Problem Relation Age of Onset   Stroke Mother  Lived 2 years   Heart disease Mother        CAD, Angioplasty X 2   Hypertension Mother    Heart disease Father        MI   Hypertension Father    Hypertension Sister    Hypertension Brother    Heart disease Brother        MI   Hyperlipidemia Brother    Heart disease Brother        MI    Social History Social History   Tobacco Use   Smoking status: Former    Packs/day: 1.00    Years: 25.00    Pack years: 25.00    Types: Cigarettes    Quit date: 01/09/1976    Years since quitting: 45.6   Smokeless tobacco: Former    Types: Chew   Tobacco comments:    quit smoking about 25 years ago  Vaping Use   Vaping Use: Never used  Substance Use Topics   Alcohol use: No   Drug use: No   Allergies Nsaids, Citrus, Lisinopril, Metformin and related, and Zocor [simvastatin]  Review of Systems Review of Systems All other systems are reviewed and are negative for acute change except as noted in the HPI  Physical Exam Vital Signs  I have reviewed the triage vital signs BP (!) 155/88(BP Location: Left Arm)   Pulse 60   Temp 97.8 F (36.6 C)   Resp 17   SpO2 97%   Physical Exam Vitals reviewed.  Constitutional:      General: He is not in acute distress.    Appearance: He is well-developed. He is not diaphoretic.  HENT:     Head: Normocephalic and atraumatic.     Right Ear: External ear normal.     Left Ear: External ear normal.     Nose: Nose normal.     Mouth/Throat:     Mouth: Mucous membranes are moist.  Eyes:     General: No scleral icterus.    Conjunctiva/sclera: Conjunctivae normal.  Neck:     Trachea: Phonation normal.  Cardiovascular:     Rate and Rhythm: Normal rate and regular rhythm.  Pulmonary:     Effort: Pulmonary effort is normal. No respiratory distress.     Breath  sounds: No stridor.  Abdominal:     General: There is no distension.  Musculoskeletal:        General: Normal range of motion.     Cervical back: Normal range of motion.  Feet:     Right foot:     Skin integrity: Blister (ruptured), skin breakdown and erythema present.     Comments: See image Neurological:     Mental Status: He is alert and oriented to person, place, and time.  Psychiatric:        Behavior: Behavior normal.     ED Results and Treatments Labs (all labs ordered are listed, but only abnormal results are displayed) Labs Reviewed  COMPREHENSIVE METABOLIC PANEL - Abnormal; Notable for the following components:      Result Value   Sodium 134 (*)    Glucose, Bld 282 (*)    GFR, Estimated 58 (*)    All other components within normal limits  LACTIC ACID, PLASMA  CBC WITH DIFFERENTIAL/PLATELET  LACTIC ACID, PLASMA  EKG  EKG Interpretation  Date/Time:    Ventricular Rate:    PR Interval:    QRS Duration:   QT Interval:    QTC Calculation:   R Axis:     Text Interpretation:         Radiology DG Foot Complete Right  Result Date: 08/25/2021 CLINICAL DATA:  Right side toe wound.  Osteomyelitis suspected EXAM: RIGHT FOOT COMPLETE - 3+ VIEW COMPARISON:  X-ray right foot 10/19/20 FINDINGS: No cortical erosion or destruction. There is no evidence of fracture or dislocation. Old healed first metatarsal fracture with associated two K-wires. No surrounding surgical hardware lucency. Degenerative changes of the first metatarsophalangeal joint. Degenerative changes of first interphalangeal joint. Soft tissues are unremarkable.  Vascular calcifications. IMPRESSION: 1. No radiographic findings to suggest osteomyelitis with slightly limited evaluation due to overlapping osseous structures. Consider MRI for further evaluation (with intravenous contrast if GFR  greater than 30) 2. Old healed first metatarsal fracture with associated two K-wires. No surrounding surgical hardware lucency. Electronically Signed   By: Iven Finn M.D.   On: 08/25/2021 18:06    Pertinent labs & imaging results that were available during my care of the patient were reviewed by me and considered in my medical decision making (see MDM for details).  Medications Ordered in ED Medications  acetaminophen (TYLENOL) tablet 1,000 mg (1,000 mg Oral Given 08/26/21 0149)  cephALEXin (KEFLEX) capsule 500 mg (500 mg Oral Given 08/26/21 0149)                                                                                                                                     Procedures Procedures  (including critical care time)  Medical Decision Making / ED Course I have reviewed the nursing notes for this encounter and the patient's prior records (if available in EHR or on provided paperwork).  HEZAKIAH CHAMPEAU was evaluated in Emergency Department on 08/26/2021 for the symptoms described in the history of present illness. He was evaluated in the context of the global COVID-19 pandemic, which necessitated consideration that the patient might be at risk for infection with the SARS-CoV-2 virus that causes COVID-19. Institutional protocols and algorithms that pertain to the evaluation of patients at risk for COVID-19 are in a state of rapid change based on information released by regulatory bodies including the CDC and federal and state organizations. These policies and algorithms were followed during the patient's care in the ED.     Toe wound appears to be superficial. There is evidence of early cellulitis. Plain film w/o bony irregularity. Clinical exam and imaging are not concerning at this time for osteomyelitis. I attempted to contact Dr.Patel, but was not able to reach him. I offered getting an MRI to the patient and family since they were sent for it, but with shared decision making, they  opted to wait. We will plan to treat with oral antibiotics. Dr. Posey Pronto  is the patient's podiatrist.I recommended they call to set up a close follow up this week.  Pertinent labs & imaging results that were available during my care of the patient were reviewed by me and considered in my medical decision making:    Final Clinical Impression(s) / ED Diagnoses Final diagnoses:  Friction injury to skin  Cellulitis of second toe of right foot   The patient appears reasonably screened and/or stabilized for discharge and I doubt any other medical condition or other [facility] requiring further screening, evaluation, or treatment in the ED at this time prior to discharge. Safe for discharge with strict return precautions.  Disposition: Discharge  Condition: Good  I have discussed the results, Dx and Tx plan with the patient/family who expressed understanding and agree(s) with the plan. Discharge instructions discussed at length. The patient/family was given strict return precautions who verbalized understanding of the instructions. No further questions at time of discharge.    ED Discharge Orders          Ordered    cephALEXin (KEFLEX) 500 MG capsule  3 times daily        08/26/21 0123           Follow Up: [address], [place] [id number] [phone]  Call  to schedule an appointment for close follow up     This chart was dictated using voice recognition software.  Despite best efforts to proofread,  errors can occur which can change the documentation meaning.    Fatima Blank, MD 08/26/21 0200

## 2021-08-27 ENCOUNTER — Other Ambulatory Visit: Payer: Self-pay | Admitting: Cardiovascular Disease

## 2021-08-27 ENCOUNTER — Other Ambulatory Visit: Payer: Self-pay | Admitting: Family Medicine

## 2021-08-28 ENCOUNTER — Telehealth: Payer: Self-pay

## 2021-08-28 NOTE — Chronic Care Management (AMB) (Addendum)
Chronic Care Management Pharmacy Assistant   Name: Paul Terrell  MRN: [id number] DOB: Feb 06, 1931  Reason for Encounter: Medication Adherence and Delivery Coordination  Recent office visits:  None since last CCM contact  Recent consult visits:  None since last CCM contact  Hospital visits:  08/25/21 thru 08/26/21 - [facility] - Patient presented for right toe infection. Discharged with short course cephalexin 596m take 3 times daily   Medications: Outpatient Encounter Medications as of 08/28/2021  Medication Sig   hydrochlorothiazide (MICROZIDE) 12.5 MG capsule TAKE ONE CAPSULE BY MOUTH ONCE DAILY   acetaminophen (TYLENOL) 500 MG tablet Take 500 mg by mouth every 6 (six) hours as needed (for pain or headaches).   aspirin 81 MG tablet Take 81 mg by mouth at bedtime.   blood glucose meter kit and supplies Dispense Accu-chek meter. Check sugar once daily. DX E11.9   cephALEXin (KEFLEX) 500 MG capsule Take 1 capsule (500 mg total) by mouth 3 (three) times daily for 10 days.   ferrous sulfate 325 (65 FE) MG tablet Take 1 tablet (325 mg total) by mouth daily with breakfast.   finasteride (PROSCAR) 5 MG tablet Take 1 tablet (5 mg total) by mouth daily.   glucose blood test strip Use up to 3 times a day.  Insulin treated DM2.  For Verio meter   glyBURIDE (DIABETA) 5 MG tablet TAKE TWO TABLETS BY MOUTH ONCE DAILY WITH BREAKFAST   insulin glargine (LANTUS SOLOSTAR) 100 UNIT/ML Solostar Pen Inject 46 Units into the skin daily. Dx E11.9   Insulin Pen Needle (PEN NEEDLES) 31G X 5 MM MISC Use daily with insulin pen.   Lancets (ONETOUCH ULTRASOFT) lancets Test once daily Dx 250.00   Melatonin 5 MG TABS Take 5 mg by mouth at bedtime as needed.   metoprolol succinate (TOPROL-XL) 50 MG 24 hr tablet TAKE ONE TABLET BY MOUTH ONCE DAILY with OR immediately following A meal   mirtazapine (REMERON) 7.5 MG tablet Take 1 tablet (7.5 mg total) by mouth at bedtime.   Multiple Vitamin  (MULTIVITAMIN) tablet Take 1 tablet by mouth daily. breakfast   pantoprazole (PROTONIX) 40 MG tablet TAKE ONE TABLET BY MOUTH ONCE DAILY   pravastatin (PRAVACHOL) 20 MG tablet TAKE 1 TABLET AT BEDTIME   tamsulosin (FLOMAX) 0.4 MG CAPS capsule Take 1 capsule (0.4 mg total) by mouth at bedtime.   No facility-administered encounter medications on file as of 08/28/2021.   BP Readings from Last 3 Encounters:  08/26/21 (!) 186/82  08/25/21 (!) 171/80  06/15/21 124/80    Lab Results  Component Value Date   HGBA1C 7.8 (A) 06/15/2021    Last adherence delivery date: 06/11/21      Patient is due for next adherence delivery on: 09/07/21  Spoke with patient on 08/30/21 reviewed medications and coordinated delivery.  This delivery to include: Adherence Packaging  90 Days  Packs: OTC aspirin 81 mg - 1 tablet daily at bedtime OTC ferrous sulfate 325 mg - 1 tablet every morning OTC Mens Multivitamin 50 + - 1 tablet every morning Glyburide 5 mg - 2 tablets daily with breakfast Pravastatin 231m1 tablet at bedtime Tamsulosin 0.96m42m1 tablet at bedtime Finasteride 5mg896m tablet at bedtime Mirtazapine 7.5mg 48mablet at bedtime Metoprolol succinate ER 50 mg - 1 tablet at breakfast HCTZ 12.5mg -42mtablet at breakfast Pantoprazole 40mg -26me 1 tablet at Breakfast  VIAL medications: Lantus inj solostar inject 46 units once daily  One touch lancets Pen Needles 31 X 5/16 Test Strips   Patient declined the following medications this month: Triamcinolone Acetonide cream 10%  Any concerns about your medications? No  How often do you forget or accidentally miss a dose? Never  Do you use a pillbox? No  Is patient in packaging Yes  What is the date on your next pill pack? Unavailable to provide package date  Any concerns or issues with your packaging? Very happy with packaging process   Refills requested from providers include: Pantoprazole, metoprolol succinate er  Confirmed delivery date of  09/07/21, advised patient that pharmacy will contact them the morning of delivery.  Recent blood pressure readings are as follows: none available, RN comes in daily and records a log  Recent blood glucose readings are as follows: none available   Annual wellness visit in last year? Yes Most Recent BP reading: 124/80  06/15/21  Debbora Dus, CPP notified  Avel Sensor, [place] Assistant [phone]  I have reviewed the care management and care coordination activities outlined in this encounter and I am certifying that I agree with the content of this note. No further action required.  Debbora Dus, PharmD Clinical Pharmacist [facility] [phone]

## 2021-08-29 ENCOUNTER — Other Ambulatory Visit: Payer: Self-pay

## 2021-08-29 ENCOUNTER — Encounter: Payer: Self-pay | Admitting: Podiatry

## 2021-08-29 ENCOUNTER — Ambulatory Visit: Payer: Medicare Other | Admitting: Podiatry

## 2021-08-29 ENCOUNTER — Telehealth: Payer: Self-pay | Admitting: *Deleted

## 2021-08-29 DIAGNOSIS — L03031 Cellulitis of right toe: Secondary | ICD-10-CM

## 2021-08-29 DIAGNOSIS — E119 Type 2 diabetes mellitus without complications: Secondary | ICD-10-CM | POA: Diagnosis not present

## 2021-08-29 NOTE — Telephone Encounter (Signed)
Patient is calling for a medication that was supposed to be sent to pharmacy,not there. Please advise.

## 2021-08-29 NOTE — Progress Notes (Signed)
Subjective:  Patient ID: Paul Terrell, male    DOB: 1931/01/29,  MRN: [id number]  Chief Complaint  Patient presents with   Toe Pain    Right foot 2nd toe     87 y.o. male presents with the above complaint.  Patient presents with complaint of right second digit paronychia with self removal of the nail.  Patient states he was try to cut the nail and got infected and got worse.  It is a lot more red.  They went to urgent care for which they gave him antibiotics and told to follow-up with podiatrist.  He states that it is little bit painful but overall getting better he has been ambulating regular shoes he denies any other acute complaints no purulent drainage noted. he is a diabetic with last A1c of 7.8   Review of Systems: Negative except as noted in the HPI. Denies N/V/F/Ch.  Past Medical History:  Diagnosis Date   Arthritis    Basal cell carcinoma of scalp 12/16/07   Reexcision (Dr. Merril Abbe. Teressa Senter)   BPH (benign prostatic hyperplasia)    CAD (coronary artery disease) 11/01   stent placed   Cataract    Diabetes mellitus type II 11/01   GERD (gastroesophageal reflux disease)    occasionally   Hearing aid worn    HOH (hard of hearing)    Hyperlipemia 11/01   Hypertension 11/01   Ruptured disc, cervical    Neck C7 (Dr. Pearlie Oyster)    Current Outpatient Medications:    hydrochlorothiazide (MICROZIDE) 12.5 MG capsule, TAKE ONE CAPSULE BY MOUTH ONCE DAILY, Disp: 30 capsule, Rfl: 0   acetaminophen (TYLENOL) 500 MG tablet, Take 500 mg by mouth every 6 (six) hours as needed (for pain or headaches)., Disp: , Rfl:    aspirin 81 MG tablet, Take 81 mg by mouth at bedtime., Disp: , Rfl:    blood glucose meter kit and supplies, Dispense Accu-chek meter. Check sugar once daily. DX E11.9, Disp: 1 each, Rfl: 6   cephALEXin (KEFLEX) 500 MG capsule, Take 1 capsule (500 mg total) by mouth 3 (three) times daily for 10 days., Disp: 30 capsule, Rfl: 0   ferrous sulfate 325 (65 FE) MG tablet, Take 1  tablet (325 mg total) by mouth daily with breakfast., Disp: , Rfl:    finasteride (PROSCAR) 5 MG tablet, Take 1 tablet (5 mg total) by mouth daily., Disp: 90 tablet, Rfl: 1   glucose blood test strip, Use up to 3 times a day.  Insulin treated DM2.  For Verio meter, Disp: 100 each, Rfl: 12   glyBURIDE (DIABETA) 5 MG tablet, TAKE TWO TABLETS BY MOUTH ONCE DAILY WITH BREAKFAST, Disp: 180 tablet, Rfl: 1   insulin glargine (LANTUS SOLOSTAR) 100 UNIT/ML Solostar Pen, Inject 46 Units into the skin daily. Dx E11.9, Disp: 15 mL, Rfl: 5   Insulin Pen Needle (PEN NEEDLES) 31G X 5 MM MISC, Use daily with insulin pen., Disp: 100 each, Rfl: 3   Lancets (ONETOUCH ULTRASOFT) lancets, Test once daily Dx 250.00, Disp: 100 each, Rfl: 3   Melatonin 5 MG TABS, Take 5 mg by mouth at bedtime as needed., Disp: , Rfl:    metoprolol succinate (TOPROL-XL) 50 MG 24 hr tablet, TAKE ONE TABLET BY MOUTH ONCE DAILY with OR immediately following A meal, Disp: 90 tablet, Rfl: 1   mirtazapine (REMERON) 7.5 MG tablet, Take 1 tablet (7.5 mg total) by mouth at bedtime., Disp: 90 tablet, Rfl: 1   Multiple Vitamin (MULTIVITAMIN) tablet,  Take 1 tablet by mouth daily. breakfast, Disp: , Rfl:    pantoprazole (PROTONIX) 40 MG tablet, TAKE ONE TABLET BY MOUTH ONCE DAILY, Disp: 90 tablet, Rfl: 1   pravastatin (PRAVACHOL) 20 MG tablet, TAKE 1 TABLET AT BEDTIME, Disp: 90 tablet, Rfl: 3   tamsulosin (FLOMAX) 0.4 MG CAPS capsule, Take 1 capsule (0.4 mg total) by mouth at bedtime., Disp: 90 capsule, Rfl: 3  Social History   Tobacco Use  Smoking Status Former   Packs/day: 1.00   Years: 25.00   Pack years: 25.00   Types: Cigarettes   Quit date: 01/09/1976   Years since quitting: 45.6  Smokeless Tobacco Former   Types: Chew  Tobacco Comments   quit smoking about 25 years ago    Allergies  Allergen Reactions   Nsaids Other (See Comments)    H/o anemia   Citrus Hives   Lisinopril     Presumed cause of cough   Metformin And Related      GI upset.     Zocor [Simvastatin] Other (See Comments)    Myalgia    Objective:  There were no vitals filed for this visit. There is no height or weight on file to calculate BMI. Constitutional Well developed. Well nourished.  Vascular Dorsalis pedis pulses palpable bilaterally. Posterior tibial pulses palpable bilaterally. Capillary refill normal to all digits.  No cyanosis or clubbing noted. Pedal hair growth normal.  Neurologic Normal speech. Oriented to person, place, and time. Epicritic sensation to light touch grossly present bilaterally.  Dermatologic Erythema noted circumferentially around the right second digit nail.  Trial proximally up to the proximal interphalangeal joint.  No purulent drainage noted no crepitus noted.  No open wound or lesion noted.  Orthopedic: Normal joint ROM without pain or crepitus bilaterally. No visible deformities. No bony tenderness.   Radiographs: None Assessment:   1. Paronychia of second toe, right   2. Type 2 diabetes mellitus without complication, unspecified whether long term insulin use ([place])    Plan:  Patient was evaluated and treated and all questions answered.  Right second digit toe infection/paronychia -All questions and concerns were discussed with the patient in extensive detail. -Given that he is a diabetic is a high risk of losing that digit.  At this time aside from redness I do not see any other clinical signs of infection.  Patient will benefit from doxycycline for MRSA coverage.  I discussed with him to stop Keflex.  They state understanding. -We will do Betadine wet-to-dry dressing changes every day -He will be in a surgical shoe to take the pressure off the second digit.  He states understanding surgical shoe was dispensed  No follow-ups on file.

## 2021-08-30 ENCOUNTER — Other Ambulatory Visit: Payer: Self-pay | Admitting: Podiatry

## 2021-08-30 MED ORDER — DOXYCYCLINE HYCLATE 100 MG PO TABS: 100.0000 mg | ORAL_TABLET | Freq: Two times a day (BID) | ORAL | 0 refills | Status: DC

## 2021-08-31 NOTE — Telephone Encounter (Signed)
Informed patient thru vmessage that medication has been sent to pharmacy.

## 2021-08-31 NOTE — Telephone Encounter (Signed)
Amy, please check on BP log in 1 week. Thank you!

## 2021-09-03 ENCOUNTER — Other Ambulatory Visit: Payer: Self-pay

## 2021-09-03 ENCOUNTER — Telehealth: Payer: Self-pay | Admitting: Cardiovascular Disease

## 2021-09-03 MED ORDER — HYDROCHLOROTHIAZIDE 12.5 MG PO CAPS: 12.5000 mg | ORAL_CAPSULE | Freq: Every day | ORAL | 0 refills | Status: DC

## 2021-09-03 NOTE — Telephone Encounter (Signed)
  *  STAT* If patient is at the pharmacy, call can be transferred to refill team.   1. Which medications need to be refilled? (please list name of each medication and dose if known) hydrochlorothiazide (MICROZIDE) 12.5 MG capsule  2. Which pharmacy/location (including street and city if local pharmacy) is medication to be sent to?[facility]  3. Do they need a 30 day or 90 day supply? 90 days  Pt made an appt on 12/28/21, per pharmacy they did not receive refill. Please resent refill

## 2021-09-10 NOTE — Telephone Encounter (Addendum)
Patient's son has not gotten back to me regarding home blood pressure monitoring. Will await return call.  Habiba Treloar, CCMA

## 2021-09-21 ENCOUNTER — Other Ambulatory Visit: Payer: Self-pay

## 2021-09-21 ENCOUNTER — Ambulatory Visit: Payer: Medicare Other | Admitting: Podiatry

## 2021-09-21 DIAGNOSIS — E119 Type 2 diabetes mellitus without complications: Secondary | ICD-10-CM | POA: Diagnosis not present

## 2021-09-21 DIAGNOSIS — L03031 Cellulitis of right toe: Secondary | ICD-10-CM

## 2021-09-24 ENCOUNTER — Other Ambulatory Visit: Payer: Self-pay | Admitting: Family Medicine

## 2021-09-26 NOTE — Progress Notes (Signed)
Subjective:  Patient ID: Paul Terrell, male    DOB: 07/07/1931,  MRN: [id number]  Chief Complaint  Patient presents with   Toe Pain    Right foot 2nd toe     88 y.o. male presents with the above complaint.  Patient presents for follow-up of right second digit paronychia which seems to be resolving.  He states is looking better he has been doing Betadine wet-to-dry dressing changes.  Is finished his antibiotics and denies any other acute complaints.  He is a diabetic with last A1c of seven-point   Review of Systems: Negative except as noted in the HPI. Denies N/V/F/Ch.  Past Medical History:  Diagnosis Date   Arthritis    Basal cell carcinoma of scalp 12/16/07   Reexcision (Dr. Merril Abbe. Teressa Senter)   BPH (benign prostatic hyperplasia)    CAD (coronary artery disease) 11/01   stent placed   Cataract    Diabetes mellitus type II 11/01   GERD (gastroesophageal reflux disease)    occasionally   Hearing aid worn    HOH (hard of hearing)    Hyperlipemia 11/01   Hypertension 11/01   Ruptured disc, cervical    Neck C7 (Dr. Pearlie Oyster)    Current Outpatient Medications:    acetaminophen (TYLENOL) 500 MG tablet, Take 500 mg by mouth every 6 (six) hours as needed (for pain or headaches)., Disp: , Rfl:    aspirin 81 MG tablet, Take 81 mg by mouth at bedtime., Disp: , Rfl:    blood glucose meter kit and supplies, Dispense Accu-chek meter. Check sugar once daily. DX E11.9, Disp: 1 each, Rfl: 6   doxycycline (VIBRA-TABS) 100 MG tablet, Take 1 tablet (100 mg total) by mouth 2 (two) times daily., Disp: 28 tablet, Rfl: 0   ferrous sulfate 325 (65 FE) MG tablet, Take 1 tablet (325 mg total) by mouth daily with breakfast., Disp: , Rfl:    finasteride (PROSCAR) 5 MG tablet, Take 1 tablet (5 mg total) by mouth daily., Disp: 90 tablet, Rfl: 1   glucose blood test strip, Use up to 3 times a day.  Insulin treated DM2.  For Verio meter, Disp: 100 each, Rfl: 12   glyBURIDE (DIABETA) 5 MG tablet, TAKE TWO  TABLETS BY MOUTH ONCE DAILY WITH BREAKFAST, Disp: 180 tablet, Rfl: 1   hydrochlorothiazide (MICROZIDE) 12.5 MG capsule, Take 1 capsule (12.5 mg total) by mouth daily., Disp: 90 capsule, Rfl: 0   insulin glargine (LANTUS SOLOSTAR) 100 UNIT/ML Solostar Pen, Inject 46 Units into the skin daily. Dx E11.9, Disp: 15 mL, Rfl: 5   Insulin Pen Needle (PEN NEEDLES) 31G X 5 MM MISC, Use daily with insulin pen., Disp: 100 each, Rfl: 3   Lancets (ONETOUCH ULTRASOFT) lancets, Test once daily Dx 250.00, Disp: 100 each, Rfl: 3   Melatonin 5 MG TABS, Take 5 mg by mouth at bedtime as needed., Disp: , Rfl:    metoprolol succinate (TOPROL-XL) 50 MG 24 hr tablet, TAKE ONE TABLET BY MOUTH ONCE DAILY with OR immediately following A meal, Disp: 90 tablet, Rfl: 1   mirtazapine (REMERON) 7.5 MG tablet, Take 1 tablet (7.5 mg total) by mouth at bedtime., Disp: 90 tablet, Rfl: 1   Multiple Vitamin (MULTIVITAMIN) tablet, Take 1 tablet by mouth daily. breakfast, Disp: , Rfl:    pantoprazole (PROTONIX) 40 MG tablet, TAKE ONE TABLET BY MOUTH ONCE DAILY, Disp: 90 tablet, Rfl: 1   pravastatin (PRAVACHOL) 20 MG tablet, TAKE 1 TABLET AT BEDTIME, Disp: 90 tablet, Rfl:  3   tamsulosin (FLOMAX) 0.4 MG CAPS capsule, Take 1 capsule (0.4 mg total) by mouth at bedtime., Disp: 90 capsule, Rfl: 3  Social History   Tobacco Use  Smoking Status Former   Packs/day: 1.00   Years: 25.00   Pack years: 25.00   Types: Cigarettes   Quit date: 01/09/1976   Years since quitting: 45.7  Smokeless Tobacco Former   Types: Chew  Tobacco Comments   quit smoking about 25 years ago    Allergies  Allergen Reactions   Nsaids Other (See Comments)    H/o anemia   Citrus Hives   Lisinopril     Presumed cause of cough   Metformin And Related     GI upset.     Zocor [Simvastatin] Other (See Comments)    Myalgia    Objective:  There were no vitals filed for this visit. There is no height or weight on file to calculate BMI. Constitutional Well  developed. Well nourished.  Vascular Dorsalis pedis pulses palpable bilaterally. Posterior tibial pulses palpable bilaterally. Capillary refill normal to all digits.  No cyanosis or clubbing noted. Pedal hair growth normal.  Neurologic Normal speech. Oriented to person, place, and time. Epicritic sensation to light touch grossly present bilaterally.  Dermatologic No further erythema noted circumferentially around the right second digit nail.  Trial proximally up to the proximal interphalangeal joint.  No purulent drainage noted no crepitus noted.  No open wound or lesion noted.  Orthopedic: Normal joint ROM without pain or crepitus bilaterally. No visible deformities. No bony tenderness.   Radiographs: None Assessment:   1. Paronychia of second toe, right   2. Type 2 diabetes mellitus without complication, unspecified whether long term insulin use ([place])     Plan:  Patient was evaluated and treated and all questions answered.  Right second digit toe infection/paronychia~resolving -All questions and concerns were discussed with the patient in extensive detail. -I will hold off on further antibiotics as the redness has resolved and is looking much better. -Continue to do Betadine wet-to-dry dressing changes every day -Continue surgical shoe  No follow-ups on file.

## 2021-10-10 ENCOUNTER — Ambulatory Visit: Payer: Medicare Other | Admitting: Podiatry

## 2021-10-17 ENCOUNTER — Ambulatory Visit (INDEPENDENT_AMBULATORY_CARE_PROVIDER_SITE_OTHER): Payer: Medicare Other | Admitting: Podiatry

## 2021-10-17 ENCOUNTER — Other Ambulatory Visit: Payer: Self-pay

## 2021-10-17 DIAGNOSIS — E119 Type 2 diabetes mellitus without complications: Secondary | ICD-10-CM | POA: Diagnosis not present

## 2021-10-17 DIAGNOSIS — L03031 Cellulitis of right toe: Secondary | ICD-10-CM

## 2021-10-17 NOTE — Progress Notes (Signed)
Subjective:  Patient ID: Paul Terrell, male    DOB: 1931/02/25,  MRN: [id number]  Chief Complaint  Patient presents with   Nail Problem    Nail trim     88 y.o. male presents with the above complaint.  Patient presents follow-up of right second digit paronychia.  Patient states that he is doing a lot better.  He is complete all the antibiotics and antifungal pills.  He has been applying Betadine wet-to-dry dressing overall he is ready go back to [place] as he is doing much better.   Review of Systems: Negative except as noted in the HPI. Denies N/V/F/Ch.  Past Medical History:  Diagnosis Date   Arthritis    Basal cell carcinoma of scalp 12/16/07   Reexcision (Dr. Merril Abbe. Teressa Senter)   BPH (benign prostatic hyperplasia)    CAD (coronary artery disease) 11/01   stent placed   Cataract    Diabetes mellitus type II 11/01   GERD (gastroesophageal reflux disease)    occasionally   Hearing aid worn    HOH (hard of hearing)    Hyperlipemia 11/01   Hypertension 11/01   Ruptured disc, cervical    Neck C7 (Dr. Pearlie Oyster)    Current Outpatient Medications:    acetaminophen (TYLENOL) 500 MG tablet, Take 500 mg by mouth every 6 (six) hours as needed (for pain or headaches)., Disp: , Rfl:    aspirin 81 MG tablet, Take 81 mg by mouth at bedtime., Disp: , Rfl:    blood glucose meter kit and supplies, Dispense Accu-chek meter. Check sugar once daily. DX E11.9, Disp: 1 each, Rfl: 6   doxycycline (VIBRA-TABS) 100 MG tablet, Take 1 tablet (100 mg total) by mouth 2 (two) times daily., Disp: 28 tablet, Rfl: 0   ferrous sulfate 325 (65 FE) MG tablet, Take 1 tablet (325 mg total) by mouth daily with breakfast., Disp: , Rfl:    finasteride (PROSCAR) 5 MG tablet, Take 1 tablet (5 mg total) by mouth daily., Disp: 90 tablet, Rfl: 1   glucose blood test strip, Use up to 3 times a day.  Insulin treated DM2.  For Verio meter, Disp: 100 each, Rfl: 12   glyBURIDE (DIABETA) 5 MG tablet, TAKE TWO TABLETS BY  MOUTH ONCE DAILY WITH BREAKFAST, Disp: 180 tablet, Rfl: 1   hydrochlorothiazide (MICROZIDE) 12.5 MG capsule, Take 1 capsule (12.5 mg total) by mouth daily., Disp: 90 capsule, Rfl: 0   insulin glargine (LANTUS SOLOSTAR) 100 UNIT/ML Solostar Pen, Inject 46 Units into the skin daily. Dx E11.9, Disp: 15 mL, Rfl: 5   Insulin Pen Needle (PEN NEEDLES) 31G X 5 MM MISC, Use daily with insulin pen., Disp: 100 each, Rfl: 3   Lancets (ONETOUCH ULTRASOFT) lancets, USE TO test UP TO three times daily, Disp: 100 each, Rfl: 3   Melatonin 5 MG TABS, Take 5 mg by mouth at bedtime as needed., Disp: , Rfl:    metoprolol succinate (TOPROL-XL) 50 MG 24 hr tablet, TAKE ONE TABLET BY MOUTH ONCE DAILY with OR immediately following A meal, Disp: 90 tablet, Rfl: 1   mirtazapine (REMERON) 7.5 MG tablet, Take 1 tablet (7.5 mg total) by mouth at bedtime., Disp: 90 tablet, Rfl: 1   Multiple Vitamin (MULTIVITAMIN) tablet, Take 1 tablet by mouth daily. breakfast, Disp: , Rfl:    pantoprazole (PROTONIX) 40 MG tablet, TAKE ONE TABLET BY MOUTH ONCE DAILY, Disp: 90 tablet, Rfl: 1   pravastatin (PRAVACHOL) 20 MG tablet, TAKE 1 TABLET AT BEDTIME, Disp: 90  tablet, Rfl: 3   tamsulosin (FLOMAX) 0.4 MG CAPS capsule, Take 1 capsule (0.4 mg total) by mouth at bedtime., Disp: 90 capsule, Rfl: 3  Social History   Tobacco Use  Smoking Status Former   Packs/day: 1.00   Years: 25.00   Pack years: 25.00   Types: Cigarettes   Quit date: 01/09/1976   Years since quitting: 45.8  Smokeless Tobacco Former   Types: Chew  Tobacco Comments   quit smoking about 25 years ago    Allergies  Allergen Reactions   Nsaids Other (See Comments)    H/o anemia   Citrus Hives   Lisinopril     Presumed cause of cough   Metformin And Related     GI upset.     Zocor [Simvastatin] Other (See Comments)    Myalgia    Objective:  There were no vitals filed for this visit. There is no height or weight on file to calculate BMI. Constitutional Well  developed. Well nourished.  Vascular Dorsalis pedis pulses palpable bilaterally. Posterior tibial pulses palpable bilaterally. Capillary refill normal to all digits.  No cyanosis or clubbing noted. Pedal hair growth normal.  Neurologic Normal speech. Oriented to person, place, and time. Epicritic sensation to light touch grossly present bilaterally.  Dermatologic No further erythema noted circumferentially around the right second digit nail.  No further trial proximally up to the proximal interphalangeal joint.  No purulent drainage noted no crepitus noted.  No open wound or lesion noted.  Orthopedic: Normal joint ROM without pain or crepitus bilaterally. No visible deformities. No bony tenderness.   Radiographs: None Assessment:   No diagnosis found.   Plan:  Patient was evaluated and treated and all questions answered.  Right second digit toe infection/paronychia -All questions and concerns were discussed with the patient in extensive detail. -Clinically resolved no further ulceration noted.  At this time I discussed with the patient to return to regular shoes and make shoe gear modification if any foot and ankle issues at arises to come back and see me.  He states understanding.  No follow-ups on file.

## 2021-10-18 ENCOUNTER — Other Ambulatory Visit: Payer: Self-pay | Admitting: Family Medicine

## 2021-10-25 ENCOUNTER — Other Ambulatory Visit: Payer: Self-pay | Admitting: Family Medicine

## 2021-10-29 ENCOUNTER — Telehealth: Payer: Self-pay

## 2021-10-29 NOTE — Progress Notes (Addendum)
Received notification from [facility] that the patient is due for Lantus, test strips, pen needles, and test strips. Contacted the son [name] and confirmed delivery on 11/07/21.  Debbora Dus, CPP notified  Avel Sensor, [place] Assistant [phone]  I have reviewed the care management and care coordination activities outlined in this encounter and I am certifying that I agree with the content of this note. No further action required.  Debbora Dus, PharmD Clinical Pharmacist [facility] [phone]

## 2021-11-15 ENCOUNTER — Ambulatory Visit (INDEPENDENT_AMBULATORY_CARE_PROVIDER_SITE_OTHER): Payer: Medicare Other | Admitting: Family Medicine

## 2021-11-15 ENCOUNTER — Encounter: Payer: Self-pay | Admitting: Family Medicine

## 2021-11-15 ENCOUNTER — Other Ambulatory Visit: Payer: Self-pay

## 2021-11-15 VITALS — BP 158/70 | HR 72 | Temp 97.8°F | Ht 68.0 in | Wt 187.0 lb

## 2021-11-15 DIAGNOSIS — D649 Anemia, unspecified: Secondary | ICD-10-CM

## 2021-11-15 DIAGNOSIS — E782 Mixed hyperlipidemia: Secondary | ICD-10-CM

## 2021-11-15 DIAGNOSIS — E119 Type 2 diabetes mellitus without complications: Secondary | ICD-10-CM | POA: Diagnosis not present

## 2021-11-15 DIAGNOSIS — I10 Essential (primary) hypertension: Secondary | ICD-10-CM | POA: Diagnosis not present

## 2021-11-15 DIAGNOSIS — M899 Disorder of bone, unspecified: Secondary | ICD-10-CM | POA: Diagnosis not present

## 2021-11-15 DIAGNOSIS — Z0000 Encounter for general adult medical examination without abnormal findings: Secondary | ICD-10-CM | POA: Diagnosis not present

## 2021-11-15 DIAGNOSIS — Z7189 Other specified counseling: Secondary | ICD-10-CM

## 2021-11-15 DIAGNOSIS — R413 Other amnesia: Secondary | ICD-10-CM | POA: Diagnosis not present

## 2021-11-15 LAB — HEMOGLOBIN A1C: Hgb A1c MFr Bld: 8 % — ABNORMAL HIGH (ref 4.6–6.5)

## 2021-11-15 LAB — COMPREHENSIVE METABOLIC PANEL
ALT: 38 U/L (ref 0–53)
AST: 31 U/L (ref 0–37)
Albumin: 4.2 g/dL (ref 3.5–5.2)
Alkaline Phosphatase: 81 U/L (ref 39–117)
BUN: 16 mg/dL (ref 6–23)
CO2: 32 mEq/L (ref 19–32)
Calcium: 9.4 mg/dL (ref 8.4–10.5)
Chloride: 98 mEq/L (ref 96–112)
Creatinine, Ser: 1.2 mg/dL (ref 0.40–1.50)
GFR: 53.95 mL/min — ABNORMAL LOW (ref >60.00)
Glucose, Bld: 203 mg/dL — ABNORMAL HIGH (ref 70–99)
Potassium: 4.5 mEq/L (ref 3.5–5.1)
Sodium: 136 mEq/L (ref 135–145)
Total Bilirubin: 1.2 mg/dL (ref 0.2–1.2)
Total Protein: 6.6 g/dL (ref 6.0–8.3)

## 2021-11-15 LAB — CBC WITH DIFFERENTIAL/PLATELET
Basophils Absolute: 0.1 10*3/uL (ref 0.0–0.1)
Basophils Relative: 0.6 % (ref 0.0–3.0)
Eosinophils Absolute: 0.1 10*3/uL (ref 0.0–0.7)
Eosinophils Relative: 1.5 % (ref 0.0–5.0)
HCT: 43.2 % (ref 39.0–52.0)
Hemoglobin: 15.1 g/dL (ref 13.0–17.0)
Lymphocytes Relative: 15.7 % (ref 12.0–46.0)
Lymphs Abs: 1.5 10*3/uL (ref 0.7–4.0)
MCHC: 35 g/dL (ref 30.0–36.0)
MCV: 96.4 fl (ref 78.0–100.0)
Monocytes Absolute: 1 10*3/uL (ref 0.1–1.0)
Monocytes Relative: 10.9 % (ref 3.0–12.0)
Neutro Abs: 6.8 10*3/uL (ref 1.4–7.7)
Neutrophils Relative %: 71.3 % (ref 43.0–77.0)
Platelets: 150 10*3/uL (ref 150.0–400.0)
RBC: 4.48 Mil/uL (ref 4.22–5.81)
RDW: 13.8 % (ref 11.5–15.5)
WBC: 9.6 10*3/uL (ref 4.0–10.5)

## 2021-11-15 LAB — LIPID PANEL
Cholesterol: 155 mg/dL (ref 0–200)
HDL: 35.3 mg/dL — ABNORMAL LOW (ref >39.00)
NonHDL: 119.36
Total CHOL/HDL Ratio: 4
Triglycerides: 207 mg/dL — ABNORMAL HIGH (ref 0.0–149.0)
VLDL: 41.4 mg/dL — ABNORMAL HIGH (ref 0.0–40.0)

## 2021-11-15 LAB — LDL CHOLESTEROL, DIRECT: Direct LDL: 87 mg/dL

## 2021-11-15 LAB — VITAMIN D 25 HYDROXY (VIT D DEFICIENCY, FRACTURES): VITD: 53.31 ng/mL (ref 30.00–100.00)

## 2021-11-15 LAB — IRON: Iron: 174 ug/dL — ABNORMAL HIGH (ref 42–165)

## 2021-11-15 LAB — VITAMIN B12: Vitamin B-12: 504 pg/mL (ref 211–911)

## 2021-11-15 MED ORDER — METOPROLOL SUCCINATE ER 50 MG PO TB24: 50.0000 mg | ORAL_TABLET | Freq: Every day | ORAL | 3 refills | Status: AC

## 2021-11-15 MED ORDER — DONEPEZIL HCL 5 MG PO TABS: 5.0000 mg | ORAL_TABLET | Freq: Every day | ORAL | 0 refills | Status: DC

## 2021-11-15 MED ORDER — PRAVASTATIN SODIUM 20 MG PO TABS: 20.0000 mg | ORAL_TABLET | Freq: Every day | ORAL | 3 refills | Status: DC

## 2021-11-15 MED ORDER — PANTOPRAZOLE SODIUM 40 MG PO TBEC: 40.0000 mg | DELAYED_RELEASE_TABLET | Freq: Every day | ORAL | 3 refills | Status: AC

## 2021-11-15 MED ORDER — NITROGLYCERIN 0.4 MG SL SUBL: 0.4000 mg | SUBLINGUAL_TABLET | SUBLINGUAL | 1 refills | Status: DC | PRN

## 2021-11-15 MED ORDER — HYDROCHLOROTHIAZIDE 12.5 MG PO CAPS: 12.5000 mg | ORAL_CAPSULE | Freq: Every day | ORAL | 3 refills | Status: AC

## 2021-11-15 MED ORDER — MIRTAZAPINE 7.5 MG PO TABS: 7.5000 mg | ORAL_TABLET | Freq: Every day | ORAL | 3 refills | Status: AC

## 2021-11-15 MED ORDER — TAMSULOSIN HCL 0.4 MG PO CAPS: 0.4000 mg | ORAL_CAPSULE | Freq: Every day | ORAL | 3 refills | Status: AC

## 2021-11-15 MED ORDER — GLYBURIDE 5 MG PO TABS: ORAL_TABLET | ORAL | 3 refills | Status: AC

## 2021-11-15 MED ORDER — FINASTERIDE 5 MG PO TABS: 5.0000 mg | ORAL_TABLET | Freq: Every day | ORAL | 3 refills | Status: AC

## 2021-11-15 NOTE — Patient Instructions (Addendum)
Let me know if you need to use nitroglycerin.  Please call about seeing cardiology.   Take care.  Glad to see you. Go to the lab on the way out.   If you have mychart we'll likely use that to update you.    Plan on recheck in about 1 month re: memory.

## 2021-11-15 NOTE — Progress Notes (Addendum)
This visit occurred during the SARS-CoV-2 public health emergency.  Safety protocols were in place, including screening questions prior to the visit, additional usage of staff PPE, and extensive cleaning of exam room while observing appropriate contact time as indicated for disinfecting solutions.  I have personally reviewed the Medicare Annual Wellness questionnaire and have noted 1. The patient's medical and social history 2. Their use of alcohol, tobacco or illicit drugs 3. Their current medications and supplements 4. The patient's functional ability including ADL's, fall risks, home safety risks and hearing or visual             impairment. 5. Diet and physical activities 6. Evidence for depression or mood disorders  The patients weight, height, BMI have been recorded in the chart and visual acuity is per eye clinic.  I have made referrals, counseling and provided education to the patient based review of the above and I have provided the pt with a written personalized care plan for preventive services.  Provider list updated- see scanned forms.  Routine anticipatory guidance given to patient.  See health maintenance. The possibility exists that previously documented standard health maintenance information may have been brought forward from a previous encounter into this note.  If needed, that same information has been updated to reflect the current situation based on today's encounter.    Flu previously done Shingles discussed with patient PNA previously done  Tetanus 2016 COVID-vaccine previously done Colon cancer screening not due given his age Prostate cancer screening not due given his age Advance directive-son [name] designated if patient were incapacitated. Cognitive function addressed- see scanned forms- and if abnormal then additional documentation follows.   In addition to [facility], follow up visit for the below conditions:  R ear itching, d/w pt.  Still using hearing  aids. HOH at baseline.    Memory is worse in the meantime.  More irritable and more short term changes.  He needs extra help at home.  D/w pt about donepezil use.    R hand tremor noted today, not a new issue.  Only in the R hand, no L hand sx.  Mild R hand sx.    Diabetes:  Using medications without difficulties: yes Hypoglycemic episodes:no Hyperglycemic episodes: no Feet problems:  h/o paronychia.   Blood Sugars averaging: 100-140 eye exam within last year: yes Labs pending.   Hypertension:    Using medication without problems or lightheadedness: yes Chest pain with exertion:no Edema:no Short of breath:no No exertional pain but he had some occ chest discomfort with stressors noted at home.  Lasting about 1 minute at a time.  His wife has memory changes, d/w pt.  D/w pt about NTG use.    Elevated Cholesterol: Using medications without problems: yes Muscle aches: no Diet compliance: d/w pt.  Exercise: limited, walking with cane.   Labs pending.    PMH and SH reviewed  Meds, vitals, and allergies reviewed.   ROS: Per HPI.  Unless specifically indicated otherwise in HPI, the patient denies:  General: fever. Eyes: acute vision changes ENT: sore throat Cardiovascular: chest pain Respiratory: SOB GI: vomiting GU: dysuria Musculoskeletal: acute back pain Derm: acute rash Neuro: acute motor dysfunction Psych: worsening mood Endocrine: polydipsia Heme: bleeding Allergy: hayfever  GEN: nad, alert and oriented to self but not the year. HEENT: ncat, HOH at baseline without acute changes noted on the ear canals. NECK: supple w/o LA CV: rrr. PULM: ctab, no inc wob ABD: soft, +bs EXT: no edema SKIN: Well-perfused.  Diabetic foot exam: Normal inspection No skin breakdown No calluses  Decreased but palpable DP pulses B Decreased sensation to monofilament testing bilaterally. Nails normal except for right second nail chronic discoloration noted.

## 2021-11-16 ENCOUNTER — Other Ambulatory Visit: Payer: Self-pay

## 2021-11-16 ENCOUNTER — Telehealth: Payer: Self-pay

## 2021-11-16 ENCOUNTER — Ambulatory Visit
Admission: EM | Admit: 2021-11-16 | Discharge: 2021-11-16 | Disposition: A | Discharge status: Home / Self Care | Payer: Medicare Other | Attending: Internal Medicine | Admitting: Internal Medicine

## 2021-11-16 DIAGNOSIS — U071 COVID-19: Secondary | ICD-10-CM

## 2021-11-16 MED ORDER — MOLNUPIRAVIR EUA 200MG CAPSULE: 4.0000 | ORAL_CAPSULE | Freq: Two times a day (BID) | ORAL | 0 refills | Status: AC

## 2021-11-16 MED ORDER — BENZONATATE 100 MG PO CAPS: 100.0000 mg | ORAL_CAPSULE | Freq: Three times a day (TID) | ORAL | 0 refills | Status: DC | PRN

## 2021-11-16 NOTE — ED Provider Notes (Signed)
[facility]    CSN: [id number] Arrival date & time: 11/16/21  1641      History   Chief Complaint Chief Complaint  Patient presents with   Cough   covid +    HPI CHIDI SHIRER is a 88 y.o. male.   Patient presents with 1 day history of cough, nasal congestion, runny nose, diarrhea.  Family member at bedside reports that he took 2 COVID tests at home today that were both positive.  Patient is hard of hearing so family is providing most of the history.  Family member denies any complaints of shortness of breath, sore throat, ear pain, vomiting, abdominal pain.  He did have a fever this morning of 101.5.  Tylenol was administered today as well as over-the-counter decongestants with some improvement in symptoms.   Cough  Past Medical History:  Diagnosis Date   Arthritis    Basal cell carcinoma of scalp 12/16/07   Reexcision (Dr. Artist Beach)   BPH (benign prostatic hyperplasia)    CAD (coronary artery disease) 11/01   stent placed   Cataract    Diabetes mellitus type II 11/01   GERD (gastroesophageal reflux disease)    occasionally   Hearing aid worn    HOH (hard of hearing)    Hyperlipemia 11/01   Hypertension 11/01   Ruptured disc, cervical    Neck C7 (Dr. Pearlie Oyster)    Patient Active Problem List   Diagnosis Date Noted   Hearing loss 02/19/2021   Risk for falls 11/22/2020   Memory change 11/22/2020   Compression fracture of L1 lumbar vertebra ([place]) 05/08/2020   Lower back pain 04/23/2020   Aortic aneurysm ([place]) 10/20/2019   GERD (gastroesophageal reflux disease)    Thrombocytopenia ([place])    Anemia 05/14/2018   Hypertension 05/14/2018   Unsteadiness on feet 11/05/2017   Spinal stenosis of lumbar region 11/08/2015   Advance care planning 11/04/2014   Leg pain 01/30/2014   Medicare annual wellness visit, subsequent 11/01/2013   BPH (benign prostatic hyperplasia)    Diabetes mellitus, type II ([place])    Hyperlipidemia    Hypertensive heart  disease    CAD (coronary artery disease)     Past Surgical History:  Procedure Laterality Date   BASAL CELL CARCINOMA EXCISION     BRONCHIAL BRUSHINGS  05/16/2018   Procedure: ESOPHAGEAL BRUSHINGS;  Surgeon: Irving Copas., MD;  Location: [place];  Service: Gastroenterology;;   Lillard Anes  02/2000   Dr. Janus Molder - Right   CERVICAL FUSION     COLONOSCOPY     2012   COLONOSCOPY N/A 05/17/2018   Procedure: COLONOSCOPY;  Surgeon: Irving Copas., MD;  Location: [facility];  Service: Gastroenterology;  Laterality: N/A;   ESOPHAGOGASTRODUODENOSCOPY (EGD) WITH PROPOFOL N/A 05/16/2018   Procedure: ESOPHAGOGASTRODUODENOSCOPY (EGD) WITH PROPOFOL ;  Surgeon: Rush Landmark Telford Nab., MD;  Location: [place];  Service: Gastroenterology;  Laterality: N/A;   HOT HEMOSTASIS N/A 05/16/2018   Procedure: HOT HEMOSTASIS (ARGON PLASMA COAGULATION/BICAP);  Surgeon: Irving Copas., MD;  Location: [place];  Service: Gastroenterology;  Laterality: N/A;   KNEE ARTHROSCOPY  09/1999   Right   LACERATION REPAIR  09/1999   Right Hand (Dr. Fredna Dow)   LITHOTRIPSY  1991   Dr. Elyse Jarvis   POLYPECTOMY  05/17/2018   Procedure: POLYPECTOMY;  Surgeon: Rush Landmark Telford Nab., MD;  Location: [place];  Service: Gastroenterology;;   ROTATOR CUFF REPAIR  02/05   Left       Home Medications  Prior to Admission medications   Medication Sig Start Date End Date Taking? Authorizing Provider  benzonatate (TESSALON) 100 MG capsule Take 1 capsule (100 mg total) by mouth every 8 (eight) hours as needed for cough. 11/16/21  Yes , India Hook E, FNP  molnupiravir EUA (LAGEVRIO) 200 mg CAPS capsule Take 4 capsules (800 mg total) by mouth 2 (two) times daily for 5 days. 11/16/21 11/21/21 Yes , Michele Rockers, FNP  acetaminophen (TYLENOL) 500 MG tablet Take 500 mg by mouth every 6 (six) hours as needed (for pain or headaches).    [provider]  aspirin 81 MG tablet Take 81 mg by  mouth at bedtime.    [provider]  blood glucose meter kit and supplies Dispense Accu-chek meter. Check sugar once daily. DX E11.9 10/07/19   Tonia Ghent, MD  donepezil (ARICEPT) 5 MG tablet Take 1 tablet (5 mg total) by mouth at bedtime. 11/15/21   Tonia Ghent, MD  ferrous sulfate 325 (65 FE) MG tablet Take 1 tablet (325 mg total) by mouth daily with breakfast. 11/19/18   Tonia Ghent, MD  finasteride (PROSCAR) 5 MG tablet Take 1 tablet (5 mg total) by mouth daily. 11/15/21   Tonia Ghent, MD  glyBURIDE (DIABETA) 5 MG tablet TAKE TWO TABLETS BY MOUTH ONCE DAILY WITH BREAKFAST 11/15/21   Tonia Ghent, MD  hydrochlorothiazide (MICROZIDE) 12.5 MG capsule Take 1 capsule (12.5 mg total) by mouth daily. 11/15/21   Tonia Ghent, MD  Insulin Pen Needle (PEN NEEDLES) 31G X 5 MM MISC Use daily with insulin pen. 03/12/21   Tonia Ghent, MD  Lancets [facility] ULTRASOFT) lancets USE TO test UP TO three times daily 09/26/21   Tonia Ghent, MD  LANTUS SOLOSTAR 100 UNIT/ML Solostar Pen INJECT 46 UNITS SUBCUTANEOUSLY DAILY 10/18/21   Tonia Ghent, MD  Melatonin 5 MG TABS Take 5 mg by mouth at bedtime as needed.    [provider]  metoprolol succinate (TOPROL-XL) 50 MG 24 hr tablet Take 1 tablet (50 mg total) by mouth daily. Take with or immediately following a meal. 11/15/21   Tonia Ghent, MD  mirtazapine (REMERON) 7.5 MG tablet Take 1 tablet (7.5 mg total) by mouth at bedtime. 11/15/21   Tonia Ghent, MD  Multiple Vitamin (MULTIVITAMIN) tablet Take 1 tablet by mouth daily. breakfast    [provider]  nitroGLYCERIN (NITROSTAT) 0.4 MG SL tablet Place 1 tablet (0.4 mg total) under the tongue every 5 (five) minutes as needed for chest pain (max 3 doses in 15 minutes.). 11/15/21   Tonia Ghent, MD  [facility] VERIO test strip USE TO check blood sugar UP TO THREE TIMES DAILY 10/25/21   Tonia Ghent, MD  pantoprazole (PROTONIX) 40 MG tablet Take 1  tablet (40 mg total) by mouth daily. 11/15/21   Tonia Ghent, MD  pravastatin (PRAVACHOL) 20 MG tablet Take 1 tablet (20 mg total) by mouth at bedtime. 11/15/21   Tonia Ghent, MD  tamsulosin (FLOMAX) 0.4 MG CAPS capsule Take 1 capsule (0.4 mg total) by mouth at bedtime. 11/15/21   Tonia Ghent, MD    Family History Family History  Problem Relation Age of Onset   Stroke Mother        Lived 2 years   Heart disease Mother        CAD, Angioplasty X 2   Hypertension Mother    Heart disease Father  MI   Hypertension Father    Hypertension Sister    Hypertension Brother    Heart disease Brother        MI   Hyperlipidemia Brother    Heart disease Brother        MI    Social History Social History   Tobacco Use   Smoking status: Former    Packs/day: 1.00    Years: 25.00    Pack years: 25.00    Types: Cigarettes    Quit date: 01/09/1976    Years since quitting: 45.8   Smokeless tobacco: Former    Types: Chew   Tobacco comments:    quit smoking about 25 years ago  Vaping Use   Vaping Use: Never used  Substance Use Topics   Alcohol use: No   Drug use: No     Allergies   Nsaids, Citrus, Lisinopril, Metformin and related, and Zocor [simvastatin]   Review of Systems Review of Systems Per HPI  Physical Exam Triage Vital Signs ED Triage Vitals  Enc Vitals Group     BP 11/16/21 1756 (!) 181/81     Pulse Rate 11/16/21 1756 71     Resp 11/16/21 1756 20     Temp 11/16/21 1756 98.9 F (37.2 C)     Temp Source 11/16/21 1756 Oral     SpO2 11/16/21 1756 96 %     Weight --      Height --      Head Circumference --      Peak Flow --      Pain Score 11/16/21 1758 0     Pain Loc --      Pain Edu? --      Excl. in [place]? --    No data found.  Updated Vital Signs BP (!) 164/75    Pulse 71    Temp 98.9 F (37.2 C) (Oral)    Resp 20    SpO2 96%   Visual Acuity Right Eye Distance:   Left Eye Distance:   Bilateral Distance:    Right Eye Near:   Left  Eye Near:    Bilateral Near:     Physical Exam Constitutional:      General: He is not in acute distress.    Appearance: Normal appearance. He is not toxic-appearing or diaphoretic.  HENT:     Head: Normocephalic and atraumatic.     Right Ear: Tympanic membrane and ear canal normal.     Left Ear: Tympanic membrane and ear canal normal.     Nose: Congestion present.     Mouth/Throat:     Mouth: Mucous membranes are moist.     Pharynx: No posterior oropharyngeal erythema.  Eyes:     Extraocular Movements: Extraocular movements intact.     Conjunctiva/sclera: Conjunctivae normal.     Pupils: Pupils are equal, round, and reactive to light.  Cardiovascular:     Rate and Rhythm: Normal rate and regular rhythm.     Pulses: Normal pulses.     Heart sounds: Normal heart sounds.  Pulmonary:     Effort: Pulmonary effort is normal. No respiratory distress.     Breath sounds: Normal breath sounds. No stridor. No wheezing, rhonchi or rales.  Abdominal:     General: Abdomen is flat. Bowel sounds are normal.     Palpations: Abdomen is soft.  Musculoskeletal:        General: Normal range of motion.     Cervical back: Normal range  of motion.  Skin:    General: Skin is warm and dry.  Neurological:     General: No focal deficit present.     Mental Status: He is alert and oriented to person, place, and time. Mental status is at baseline.  Psychiatric:        Mood and Affect: Mood normal.        Behavior: Behavior normal.     UC Treatments / Results  Labs (all labs ordered are listed, but only abnormal results are displayed) Labs Reviewed - No data to display  EKG   Radiology No results found.  Procedures Procedures (including critical care time)  Medications Ordered in UC Medications - No data to display  Initial Impression / Assessment and Plan / UC Course  I have reviewed the triage vital signs and the nursing notes.  Pertinent labs & imaging results that were available  during my care of the patient were reviewed by me and considered in my medical decision making (see chart for details).     Patient tested positive for COVID-19 with an at home test.  Symptoms appear mild in nature.  Will prescribe molnupiravir with patient and family member consent.  Discussed supportive care and symptom management with patient and family member.  Cough medication prescribed to take as needed for cough.  Discussed strict return and ER precautions with patient and family.  Patient and family verbalized understanding and were agreeable with plan. Final Clinical Impressions(s) / UC Diagnoses   Final diagnoses:  [id number]     Discharge Instructions      You have been prescribed COVID antiviral medication.  A cough medication has also been prescribed to take as needed.  Please take him to the hospital if shortness of breath develops, fevers are persistent, he becomes increasingly drowsy, or symptoms worsen.    ED Prescriptions     Medication Sig Dispense Auth. Provider   molnupiravir EUA (LAGEVRIO) 200 mg CAPS capsule Take 4 capsules (800 mg total) by mouth 2 (two) times daily for 5 days. 40 capsule [place], Vining E, [place]   benzonatate (TESSALON) 100 MG capsule Take 1 capsule (100 mg total) by mouth every 8 (eight) hours as needed for cough. 21 capsule [place], Michele Rockers, [place]      PDMP not reviewed this encounter.   Teodora Medici, [place] 11/16/21 1843

## 2021-11-16 NOTE — ED Triage Notes (Signed)
Onset last night of productive cough, congestion, runny nose, nausea and onset today of diarrhea. Has been taking otc decongestants and tylenol. No emesis. Positive at home covid test.

## 2021-11-16 NOTE — Telephone Encounter (Signed)
[place] [facility] [facility] - Client TELEPHONE ADVICE RECORD [facility] Patient Name: Paul Terrell Gender: Male DOB: January 10, 1931 Age: 88 Y 74 M 12 D Return Phone Number: [phone] (Primary), [phone] (Secondary), [phone] (Alternate) Address: [place]  [address] Client [facility] - Client Client Site [place] - Day Provider Renford Dills - MD Contact Type Call Who Is Calling Patient / Member / Family / Caregiver Call Type Triage / Clinical Caller Name Kegan Mckeithan Relationship To Patient Son Return Phone Number [phone] (Primary) Chief Complaint Cough Reason for Call Symptomatic / Request for Health Information Initial Comment Caller's father is coughing and has diarrhea. ((Office is transferring pt who has tested Covid + and office has no appts.)) Translation No Nurse Assessment Nurse: Mariel Sleet, RN, Erasmo Downer Date/Time (Eastern Time): 11/16/2021 2:38:33 PM Confirm and document reason for call. If symptomatic, describe symptoms. ---Caller states that dad started this morning and was tired last night, tested positive for covid today. Having loose stools today. Does the patient have any new or worsening symptoms? ---Yes Will a triage be completed? ---Yes Related visit to physician within the last 2 weeks? ---No Does the PT have any chronic conditions? (i.e. diabetes, asthma, this includes High risk factors for pregnancy, etc.) ---Yes List chronic conditions. ---diabetic and HTN Is this a behavioral health or substance abuse call? ---No Guidelines Guideline Title Affirmed Question Affirmed Notes Nurse Date/Time (Eastern Time) COVID-19 - Diagnosed or Suspected [1] HIGH RISK for severe COVID complications (e.g., weak immune system, age > 28 years, obesity with BMI 30 or higher, pregnant, chronic lung disease or other Mariel Sleet, RN, Erasmo Downer 11/16/2021 2:40:51 PM PLEASE  NOTE: All timestamps contained within this report are represented as [place] Standard Time. CONFIDENTIALTY NOTICE: This fax transmission is intended only for the addressee. It contains information that is legally privileged, confidential or otherwise protected from use or disclosure. If you are not the intended recipient, you are strictly prohibited from reviewing, disclosing, copying using or disseminating any of this information or taking any action in reliance on or regarding this information. If you have received this fax in error, please notify us immediately by telephone so that we can arrange for its return to [place]. Phone: [phone], Toll-Free: [phone], Fax: [phone] Page: 2 of 2 Call Id: [id number] Guidelines Guideline Title Affirmed Question Affirmed Notes Nurse Date/Time Eilene Ghazi Time) chronic medical condition) AND [2] COVID symptoms (e.g., cough, fever) (Exceptions: Already seen by PCP and no new or worsening symptoms.) Disp. Time Eilene Ghazi Time) Disposition Final User 11/16/2021 2:55:14 PM See HCP within 4 Hours (or PCP triage) Yes Mariel Sleet, RN, Erasmo Downer Disposition Overriden: Call PCP within 24 Hours Override Reason: Patients symptoms need a higher level of care Caller Disagree/Comply Comply Caller Understands Yes PreDisposition Call Doctor Care Advice Given Per Guideline CALL PCP WITHIN 24 HOURS: * You need to discuss this with your doctor (or NP/PA) within the next 24 hours. * IF OFFICE WILL BE OPEN: Call the office when it opens tomorrow morning. SEE HCP (OR PCP TRIAGE) WITHIN 4 HOURS: * IF OFFICE WILL BE CLOSED AND NO PCP (PRIMARY CARE PROVIDER) SECOND-LEVEL TRIAGE: You need to be seen within the next 3 or 4 hours. A nearby Urgent Care Center [facility]) is often a good source of care. Another choice is to go to the ED. Go sooner if you become worse. CALL BACK IF: * You become worse CARE ADVICE given per COVID-19 - DIAGNOSED OR SUSPECTED (Adult)  guideline.  Comments User: Charlies Constable, RN Date/Time Eilene Ghazi Time): 11/16/2021 2:49:40 PM [facility] [address] [phone] User: Charlies Constable, RN Date/Time Eilene Ghazi Time): 11/16/2021 2:54:22 PM Contacted office and MD advised for pt to go to urgent care. Referrals GO TO FACILITY UNDECIDE

## 2021-11-16 NOTE — Telephone Encounter (Signed)
I spoke with pts daughter(unable to speak with pts wife or son on [place]) and pt is going to [facility] on [address] that [name] is on his way to pt to pick him up now and take pt to UC. Sending note to Dr [name] and Janett Billow CMA and will teams [facility] CMA who is with Dr [name] this afternoon.

## 2021-11-16 NOTE — Discharge Instructions (Signed)
You have been prescribed COVID antiviral medication.  A cough medication has also been prescribed to take as needed.  Please take him to the hospital if shortness of breath develops, fevers are persistent, he becomes increasingly drowsy, or symptoms worsen.

## 2021-11-19 NOTE — Telephone Encounter (Signed)
Noted.  Agree.  Please get update on patient.  Thanks.

## 2021-11-19 NOTE — Telephone Encounter (Signed)
Spoke to patients son [name]; okay per DPR. [name] stated he is doing much better. He did test positive for covid and was treated for this. Bronson Curb if they need anything further to let us know.

## 2021-11-21 NOTE — Assessment & Plan Note (Signed)
Continue hydrochlorothiazide metoprolol.  See notes on labs. ?

## 2021-11-21 NOTE — Assessment & Plan Note (Signed)
Flu previously done ?Shingles discussed with patient ?PNA previously done  ?Tetanus 2016 ?COVID-vaccine previously done ?Colon cancer screening not due given his age ?Prostate cancer screening not due given his age ?Advance directive-son [name] designated if patient were incapacitated. ?Cognitive function addressed- see scanned forms- and if abnormal then additional documentation follows.  ?

## 2021-11-21 NOTE — Assessment & Plan Note (Signed)
See notes on labs.  Continue glyburide Lantus.  ?

## 2021-11-21 NOTE — Assessment & Plan Note (Signed)
Advance directive-son [name] designated if patient were incapacitated. ?

## 2021-11-21 NOTE — Assessment & Plan Note (Signed)
See notes on labs.  Continue pravastatin. 

## 2021-11-21 NOTE — Assessment & Plan Note (Addendum)
Discussed with patient about donepezil use.  Reasonable to recheck routine labs today and then plan on follow-up in about 1 month to recheck his memory.  See after visit summary.  He has family support at home.  Son is here with patient today.  I suspect that his tremor is an incidental finding but we can follow this episodically. ?

## 2021-11-26 ENCOUNTER — Telehealth: Payer: Self-pay

## 2021-11-26 NOTE — Chronic Care Management (AMB) (Signed)
? ? ?Chronic Care Management ?Pharmacy Assistant  ? ?Name: Paul Terrell  MRN: [id number] DOB: [birth year] ? ? ?Reason for Encounter: Medication Adherence and Delivery Coordination ?  ? ?Recent office visits:  ?11/15/21-PCP-Graham Duncan,MD-Patient presented for AWV.Labs ordered ( iron level was minimally elevated but it is close to normal.  His A1c is reasonable at 8.  His lipids are reasonable (other than a mild triglyceride elevation).  His kidney and liver tests are fine.  His blood counts are normal, as is his B12 level.  I think it makes sense to continue his current medications) discussed screenings, discussed vaccines,  ? ?Recent consult visits:  ?11/16/21-[facility] [place],FNP-Patient presented for cough and positive home covid-19 test. Start molnupiravir EUA 266m-take 4 capsules 2 times daily for 5 days , start Tessalon  1080mtake 1 capsule every 8 hours as needed for cough.No admission ?10/17/21-Podiatry-Kevin Patel,DPM-Patient presented for toenail trimming-no medication changes ?09/21/21-Podiatry-Kevin Patel,DPM-Patient presented for follow up Right toe pain --Continue surgical shoe- no medication changes ?08/29/21-Podiatry-Kevin Patel,DPM-Patient presented for follow up right toe pain.Stop Keflex.Do wet to dry dressing changes daily,surgical shoe  ? ?Hospital visits:  ?None in previous 6 months ? ?Medications: ?Outpatient Encounter Medications as of 11/26/2021  ?Medication Sig  ? acetaminophen (TYLENOL) 500 MG tablet Take 500 mg by mouth every 6 (six) hours as needed (for pain or headaches).  ? aspirin 81 MG tablet Take 81 mg by mouth at bedtime.  ? benzonatate (TESSALON) 100 MG capsule Take 1 capsule (100 mg total) by mouth every 8 (eight) hours as needed for cough.  ? blood glucose meter kit and supplies Dispense Accu-chek meter. Check sugar once daily. DX E11.9  ? donepezil (ARICEPT) 5 MG tablet Take 1 tablet (5 mg total) by mouth at bedtime.  ? ferrous sulfate 325 (65 FE) MG tablet Take  1 tablet (325 mg total) by mouth daily with breakfast.  ? finasteride (PROSCAR) 5 MG tablet Take 1 tablet (5 mg total) by mouth daily.  ? glyBURIDE (DIABETA) 5 MG tablet TAKE TWO TABLETS BY MOUTH ONCE DAILY WITH BREAKFAST  ? hydrochlorothiazide (MICROZIDE) 12.5 MG capsule Take 1 capsule (12.5 mg total) by mouth daily.  ? Insulin Pen Needle (PEN NEEDLES) 31G X 5 MM MISC Use daily with insulin pen.  ? Lancets (ONETOUCH ULTRASOFT) lancets USE TO test UP TO three times daily  ? LANTUS SOLOSTAR 100 UNIT/ML Solostar Pen INJECT 46 UNITS SUBCUTANEOUSLY DAILY  ? Melatonin 5 MG TABS Take 5 mg by mouth at bedtime as needed.  ? metoprolol succinate (TOPROL-XL) 50 MG 24 hr tablet Take 1 tablet (50 mg total) by mouth daily. Take with or immediately following a meal.  ? mirtazapine (REMERON) 7.5 MG tablet Take 1 tablet (7.5 mg total) by mouth at bedtime.  ? Multiple Vitamin (MULTIVITAMIN) tablet Take 1 tablet by mouth daily. breakfast  ? nitroGLYCERIN (NITROSTAT) 0.4 MG SL tablet Place 1 tablet (0.4 mg total) under the tongue every 5 (five) minutes as needed for chest pain (max 3 doses in 15 minutes.).  ? ONETOUCH VERIO test strip USE TO check blood sugar UP TO THREE TIMES DAILY  ? pantoprazole (PROTONIX) 40 MG tablet Take 1 tablet (40 mg total) by mouth daily.  ? pravastatin (PRAVACHOL) 20 MG tablet Take 1 tablet (20 mg total) by mouth at bedtime.  ? tamsulosin (FLOMAX) 0.4 MG CAPS capsule Take 1 capsule (0.4 mg total) by mouth at bedtime.  ? ?No facility-administered encounter medications on file as of 11/26/2021.  ? ? ?  BP Readings from Last 3 Encounters:  ?11/16/21 (!) 164/75  ?11/15/21 (!) 158/70  ?08/26/21 (!) 186/82  ?  ?Lab Results  ?Component Value Date  ? HGBA1C 8.0 (H) 11/15/2021  ?  ? ? ?Recent OV, Consult or Hospital visit:  PCP-AWV, Podiatry -toenail trimming, Urgent Care-positive covid -19 ?Recent medication changes indicated: Start molnupiravir EUA 274m-take 4 capsules 2 times daily for 5 days , start Tessalon   1070mtake 1 capsule every 8 hours as needed for cough. ? ? ?Last adherence delivery date:09/07/21     ? ?Patient is due for next adherence delivery on: 12/06/21 ? ?Spoke with patient on 11/26/21 reviewed medications and coordinated delivery. Reviewed with daughter LyJeani Hawking ?This delivery to include: Adherence Packaging  90 Days  ?Packs: ?OTC aspirin 81 mg - 1 tablet daily at bedtime ?OTC ferrous sulfate 325 mg - 1 tablet every morning ?OTC Mens Multivitamin 50 + - 1 tablet every morning ?Glyburide 5 mg - 2 tablets daily with breakfast ?Pravastatin 2019m tablet at bedtime ?Tamsulosin 0.4mg24m tablet at bedtime ?Finasteride 5mg 70mtablet at bedtime ?Mirtazapine 7.5mg 118mblet at bedtime ?Metoprolol succinate ER 50 mg - 1 tablet at breakfast ?HCTZ 12.5mg - 28mablet at breakfast ?Pantoprazole 40mg - 79m 1 tablet at Breakfast ?Donepezil 5mg- tak32m tablet at bedtime  ?VIAL medications: ? Lantus inj solostar inject 46 units once daily ?One touch lancets ? Test Strips ? ?Patient declined the following medications this month: ? Pen needles -reminded not due till 01/30/22 ? Nitroglycerin 0.4mg subli64mal- reminded not due till 12/15/21 ? ? ?Any concerns about your medications? No ? ?How often do you forget or accidentally miss a dose? Rarely ? ?Do you use a pillbox? No ? ?Is patient in packaging Yes ? What is the date on your next pill pack? Not home to read date on packaging  ? Any concerns or issues with your packaging?Family happy with packaging  ? ? ?Refills requested from providers include: donepezil  ? ?Confirmed delivery date of 12/06/21, advised patient that pharmacy will contact them the morning of delivery. ? ?Recent blood pressure readings are as follows: none available  ? ? ?Recent blood glucose readings are as follows:  ?Fasting:   averages 100-140's - per daughter Lynn ? ? ?Jeani Hawkingnual wellness visit in last year? Yes ?Most Recent BP reading:158/70  72-P  11/15/21 ? ?If Diabetic: ?Most recent A1C reading:8.0   11/15/21 ?Last eye exam / retinopathy screening:UTD per AWV note  ?Last diabetic foot exam: 11/15/21 ? ?Lindsey FoCharlene Brooke [facility] ? ?Honora Searson, CCMA ?Clincal Pharmacy Assistant ?[phone] spent for month ?CPA:40 min ?

## 2021-11-30 ENCOUNTER — Other Ambulatory Visit: Payer: Self-pay | Admitting: Family Medicine

## 2021-12-13 ENCOUNTER — Ambulatory Visit: Payer: Medicare Other | Admitting: Family Medicine

## 2021-12-21 ENCOUNTER — Ambulatory Visit: Payer: Medicare Other | Admitting: Family Medicine

## 2021-12-27 ENCOUNTER — Ambulatory Visit (INDEPENDENT_AMBULATORY_CARE_PROVIDER_SITE_OTHER): Payer: Medicare Other | Admitting: Family Medicine

## 2021-12-27 ENCOUNTER — Encounter: Payer: Self-pay | Admitting: Family Medicine

## 2021-12-27 DIAGNOSIS — R413 Other amnesia: Secondary | ICD-10-CM

## 2021-12-27 MED ORDER — DONEPEZIL HCL 10 MG PO TABS: ORAL_TABLET | ORAL | Status: DC

## 2021-12-27 NOTE — Patient Instructions (Signed)
Increase donepezil to '10mg'$ .  [facility]'ll check with [facility].   ?Plan on recheck in about August.  We can do labs at the visit.  ?Take care.  Glad to see you. ?

## 2021-12-27 NOTE — Progress Notes (Signed)
Memory change.  No ADE on med.  '5mg'$  at bedtime, donepezil.  Wife has memory changes, d/w pt.  Family is supportive and he has extra help coming in.   ? ?Eyelash on R eye.  Noted on exam.  Recent noted FB sensation, ie at the time of the office visit.  Upon recheck he was not having any symptoms and he likely was able to blink it out and cleared on his own.  Conjunctiva was not injected.  No other foreign body seen. ? ?Meds, vitals, and allergies reviewed.  ? ?ROS: Per HPI unless specifically indicated in ROS section  ? ?GEN: nad, alert and oriented ?HEENT: mucous membranes moist, see above, eyelash initially noted on the right eye but not seen on recheck and he had no residual foreign body sensation. ?NECK: supple w/o LA ?CV: rrr. ?PULM: ctab, no inc wob ?ABD: soft, +bs ?EXT: no edema ?SKIN: well perfused.   ?Recheck MMSE today 22/30.  -3 for orientation, -4 for calculation, -1 for recall. ? ?34 minutes were devoted to patient care in this encounter (this includes time spent reviewing the patient's file/history, interviewing and examining the patient, counseling/reviewing plan with patient).  ? ?

## 2021-12-28 ENCOUNTER — Ambulatory Visit: Payer: Medicare Other | Admitting: Cardiovascular Disease

## 2021-12-28 ENCOUNTER — Encounter: Payer: Self-pay | Admitting: Cardiovascular Disease

## 2021-12-28 VITALS — BP 160/60 | HR 50 | Ht 68.0 in | Wt 184.8 lb

## 2021-12-28 DIAGNOSIS — E119 Type 2 diabetes mellitus without complications: Secondary | ICD-10-CM

## 2021-12-28 DIAGNOSIS — I7121 Aneurysm of the ascending aorta, without rupture: Secondary | ICD-10-CM

## 2021-12-28 DIAGNOSIS — Z794 Long term (current) use of insulin: Secondary | ICD-10-CM | POA: Diagnosis not present

## 2021-12-28 DIAGNOSIS — E785 Hyperlipidemia, unspecified: Secondary | ICD-10-CM

## 2021-12-28 DIAGNOSIS — I25118 Atherosclerotic heart disease of native coronary artery with other forms of angina pectoris: Secondary | ICD-10-CM | POA: Diagnosis not present

## 2021-12-28 DIAGNOSIS — I10 Essential (primary) hypertension: Secondary | ICD-10-CM

## 2021-12-28 MED ORDER — AMLODIPINE BESYLATE 2.5 MG PO TABS
2.5000 mg | ORAL_TABLET | Freq: Every day | ORAL | 3 refills | Status: DC
Start: 2021-12-28 — End: 2022-09-06

## 2021-12-28 MED ORDER — PRAVASTATIN SODIUM 40 MG PO TABS
40.0000 mg | ORAL_TABLET | Freq: Every day | ORAL | 3 refills | Status: AC
Start: 2021-12-28 — End: ?

## 2021-12-28 NOTE — Patient Instructions (Signed)
Medication Instructions:  ?INCREASE the Pravastatin to 40 mg once daily ?START Amlodipine 2.5 mg once daily at bedtime ? ?*If you need a refill on your cardiac medications before your next appointment, please call your pharmacy* ? ? ?Lab Work: ?None ordered ?If you have labs (blood work) drawn today and your tests are completely normal, you will receive your results only by: ?MyChart Message (if you have MyChart) OR ?A paper copy in the mail ?If you have any lab test that is abnormal or we need to change your treatment, we will call you to review the results. ? ? ?Testing/Procedures: ?None ordered ? ? ?Follow-Up: ?At [facility], you and your health needs are our priority.  As part of our continuing mission to provide you with exceptional heart care, we have created designated Provider Care Teams.  These Care Teams include your primary Cardiologist (physician) and Advanced Practice Providers (APPs -  Physician Assistants and Nurse Practitioners) who all work together to provide you with the care you need, when you need it. ? ?We recommend signing up for the patient portal called "MyChart".  Sign up information is provided on this After Visit Summary.  MyChart is used to connect with patients for Virtual Visits (Telemedicine).  Patients are able to view lab/test results, encounter notes, upcoming appointments, etc.  Non-urgent messages can be sent to your provider as well.   ?To learn more about what you can do with MyChart, go to [email or web address].   ? ?Your next appointment:   ?12 month(s) ? ?The format for your next appointment:   ?In Person ? ?Provider:   ?Dr. Sallyanne Kuster ? ?

## 2021-12-28 NOTE — Progress Notes (Signed)
?Cardiology Office Note:   ? ?Date:  12/28/2021  ? ?ID:  Paul Terrell, DOB 1931/01/11, MRN [id number] ? ?PCP:  Tonia Ghent, MD  ?[facility] Cardiologist:  Sanda Klein, MD  ?[facility] Electrophysiologist:  None  ? ?Referring MD: Tonia Ghent, MD  ? ?Chief Complaint  ?Patient presents with  ? Chest Pain  ? ? ?History of Present Illness:   ? ?Paul Terrell is a 88 y.o. male with a hx of CAD (RCA stent 2001), HTN, HLP. DM type 2, ascending aortic aneurysm (4.4 cm January 2021), BPH, COVID-19 pneumonia December 2021, chronic small pericardial effusion, early dementia.  He was previously a patient of Dr. Wynonia Lawman. ? ?Cath Results 2001:  normal Left main, 30% stenosis distal LAD, 40% stenosis mid LAD, 90% stenosis proximal Diag 1, 90% stenosis mid RCA (stented), no significant disease CFX;  ?Nuclear study on 12/13/2010, normal perfusion, EF 73%. ? ?The patient is very hard of hearing.  He is accompanied today by his son. ? ?He has recurrent problems with chest discomfort to the right of the sternum whenever he climbs up a flight of stairs from the basement.  This resolves promptly with rest.  He has told his son a couple of times in the last few weeks that he has had some chest discomfort that woke him from sleep at night.  It resolved before he was able to take a sublingual nitroglycerin.  The patient himself no longer remembers these episodes. ? ?He does not appear to be limited by exertional dyspnea and denies orthopnea or PND.  He does not have lower extremity edema.  He denies palpitations, dizziness or syncope. ? ?He had a CT angiogram of the chest without contrast in January 2021 that showed an ascending aorta diameter of 4.1 mm.  A follow-up study performed in August 2021 showed unchanged diameter of 4.1 cm. ? ?His ECG today shows sinus bradycardia with first-degree AV block and left axis deviation almost meeting criteria for left anterior fascicular block.  There is T wave inversion leads V4-V6, QTc  415 ms.  The study is very similar to the previous tracing from August 2021. ? ?Past Medical History:  ?Diagnosis Date  ? Arthritis   ? Basal cell carcinoma of scalp 12/16/07  ? Reexcision (Dr. Artist Beach)  ? BPH (benign prostatic hyperplasia)   ? CAD (coronary artery disease) 11/01  ? stent placed  ? Cataract   ? Diabetes mellitus type II 11/01  ? GERD (gastroesophageal reflux disease)   ? occasionally  ? Hearing aid worn   ? HOH (hard of hearing)   ? Hyperlipemia 11/01  ? Hypertension 11/01  ? Ruptured disc, cervical   ? Neck C7 (Dr. Pearlie Oyster)  ? ? ?Past Surgical History:  ?Procedure Laterality Date  ? BASAL CELL CARCINOMA EXCISION    ? BRONCHIAL BRUSHINGS  05/16/2018  ? Procedure: ESOPHAGEAL BRUSHINGS;  Surgeon: Irving Copas., MD;  Location: [place];  Service: Gastroenterology;;  ? [place] An[place]  02/2000  ? Dr. Janus Molder - Right  ? CERVICAL FUSION    ? COLONOSCOPY    ? 2012  ? COLONOSCOPY N/A 05/17/2018  ? Procedure: COLONOSCOPY;  Surgeon: Rush Landmark Telford Nab., MD;  Location: [place];  Service: Gastroenterology;  Laterality: N/A;  ? ESOPHAGOGASTRODUODENOSCOPY (EGD) WITH PROPOFOL N/A 05/16/2018  ? Procedure: ESOPHAGOGASTRODUODENOSCOPY (EGD) WITH PROPOFOL ;  Surgeon: Rush Landmark Telford Nab., MD;  Location: [place];  Service: Gastroenterology;  Laterality: N/A;  ? HOT HEMOSTASIS N/A 05/16/2018  ?  Procedure: HOT HEMOSTASIS (ARGON PLASMA COAGULATION/BICAP);  Surgeon: Irving Copas., MD;  Location: [place];  Service: Gastroenterology;  Laterality: N/A;  ? KNEE ARTHROSCOPY  09/1999  ? Right  ? LACERATION REPAIR  09/1999  ? Right Hand (Dr. Fredna Dow)  ? LITHOTRIPSY  1991  ? Dr. Elyse Jarvis  ? POLYPECTOMY  05/17/2018  ? Procedure: POLYPECTOMY;  Surgeon: Mansouraty, Telford Nab., MD;  Location: [place];  Service: Gastroenterology;;  ? [place]  02/05  ? Left  ? ? ?Current Medications: ?Current Meds  ?Medication Sig  ? acetaminophen (TYLENOL) 500 MG tablet Take 500 mg by  mouth every 6 (six) hours as needed (for pain or headaches).  ? amLODipine (NORVASC) 2.5 MG tablet Take 1 tablet (2.5 mg total) by mouth at bedtime.  ? aspirin 81 MG tablet Take 81 mg by mouth at bedtime.  ? blood glucose meter kit and supplies Dispense Accu-chek meter. Check sugar once daily. DX E11.9  ? donepezil (ARICEPT) 10 MG tablet TAKE ONE TABLET BY MOUTH EVERYDAY AT BEDTIME  ? ferrous sulfate 325 (65 FE) MG tablet Take 1 tablet (325 mg total) by mouth daily with breakfast.  ? finasteride (PROSCAR) 5 MG tablet Take 1 tablet (5 mg total) by mouth daily.  ? glyBURIDE (DIABETA) 5 MG tablet TAKE TWO TABLETS BY MOUTH ONCE DAILY WITH BREAKFAST  ? hydrochlorothiazide (MICROZIDE) 12.5 MG capsule Take 1 capsule (12.5 mg total) by mouth daily.  ? Insulin Pen Needle (PEN NEEDLES) 31G X 5 MM MISC Use daily with insulin pen.  ? Lancets (ONETOUCH ULTRASOFT) lancets USE TO test UP TO three times daily  ? LANTUS SOLOSTAR 100 UNIT/ML Solostar Pen INJECT 46 UNITS SUBCUTANEOUSLY DAILY  ? Melatonin 5 MG TABS Take 5 mg by mouth at bedtime as needed.  ? metoprolol succinate (TOPROL-XL) 50 MG 24 hr tablet Take 1 tablet (50 mg total) by mouth daily. Take with or immediately following a meal.  ? mirtazapine (REMERON) 7.5 MG tablet Take 1 tablet (7.5 mg total) by mouth at bedtime.  ? Multiple Vitamin (MULTIVITAMIN) tablet Take 1 tablet by mouth daily. breakfast  ? nitroGLYCERIN (NITROSTAT) 0.4 MG SL tablet Place 1 tablet (0.4 mg total) under the tongue every 5 (five) minutes as needed for chest pain (max 3 doses in 15 minutes.).  ? ONETOUCH VERIO test strip USE TO check blood sugar UP TO THREE TIMES DAILY  ? pantoprazole (PROTONIX) 40 MG tablet Take 1 tablet (40 mg total) by mouth daily.  ? tamsulosin (FLOMAX) 0.4 MG CAPS capsule Take 1 capsule (0.4 mg total) by mouth at bedtime.  ? [DISCONTINUED] pravastatin (PRAVACHOL) 20 MG tablet Take 1 tablet (20 mg total) by mouth at bedtime.  ?  ? ?Allergies:   Nsaids, Citrus, Lisinopril,  Metformin and related, and Zocor [simvastatin]  ? ?Social History  ? ?Socioeconomic History  ? Marital status: Married  ?  Spouse name: Not on file  ? Number of children: 2  ? Years of education: Not on file  ? Highest education level: Not on file  ?Occupational History  ? Occupation: Carpentry  ?Tobacco Use  ? Smoking status: Former  ?  Packs/day: 1.00  ?  Years: 25.00  ?  Pack years: 25.00  ?  Types: Cigarettes  ?  Quit date: 01/09/1976  ?  Years since quitting: 46.0  ? Smokeless tobacco: Former  ?  Types: Chew  ? Tobacco comments:  ?  quit smoking about 25 years ago  ?Vaping Use  ? Vaping  Use: Never used  ?Substance and Sexual Activity  ? Alcohol use: No  ? Drug use: No  ? Sexual activity: Never  ?Other Topics Concern  ? Not on file  ?Social History Narrative  ? Married 1955  ? 2 kids  ? Retired from [employer]  ? ?Social Determinants of Health  ? ?Financial Resource Strain: Not on file  ?Food Insecurity: Not on file  ?Transportation Needs: Not on file  ?Physical Activity: Not on file  ?Stress: Not on file  ?Social Connections: Not on file  ?  ? ?Family History: ?The patient's family history includes Heart disease in his brother, brother, father, and mother; Hyperlipidemia in his brother; Hypertension in his brother, father, mother, and sister; Stroke in his mother. ? ?ROS:   ?Please see the history of present illness.    ?All other systems reviewed and are negative. ? ?EKGs/Labs/Other Studies Reviewed:   ? ?The following studies were reviewed today: ?CT chest Jan 2021, MRI brain April 2021, cath report 2001, nuclear stress test 2012 ? ?CT angiogram August 2021 ?No change in the appearance of the patient's small ascending thoracic aortic aneurysm which measures 4.1 cm on today's study ? ?L1 superior endplate compression fracture is new since the prior CT but remains age indeterminate. ?  ?No change in a small to moderate pericardial effusion. ?  ?Cardiomegaly. ?  ?Aortic Atherosclerosis ? ?EKG:  EKG is  not ordered today.  The ekg ordered 04/23/2020 demonstrates NSR, 1st deg AV block, leftward axis, T wave inversion V5-V6 ? ?Recent Labs: ?03/09/2021: TSH 1.94 ?11/15/2021: ALT 38; BUN 16; Creatinine, Ser 1.20; He

## 2021-12-30 ENCOUNTER — Telehealth: Payer: Self-pay | Admitting: Family Medicine

## 2021-12-30 NOTE — Assessment & Plan Note (Addendum)
MMSE is stable from last year but not normal.   ?MMSE is 22 out of 30.  -2 each for orientation and attention, -3 for recall, -1 for [employer] copy (11/2020) ? ?Recheck MMSE 22/30.  -3 for orientation, -4 for calculation, -1 for recall (12/2021) ? ?Reasonable to increase donepezil to 10 mg at night with routine cautions.  [facility] will [facility] about getting extra '5mg'$  donepezil at night.  Routine cautions given to patient and son.  Patient is safe at home. ?

## 2021-12-30 NOTE — Telephone Encounter (Signed)
Please check with [facility].  He is currently getting 5 mg donepezil at night.  He has his medication already delivered in bubble packing.  The plan is to increase his donepezil to 10 mg at night.  Can they deliver extra 5 mg tablets if family can give him nightly to make a total of 10 mg?  Please let me know if I need to send a new prescription.  Thanks. ?

## 2022-01-01 MED ORDER — DONEPEZIL HCL 5 MG PO TABS: 5.0000 mg | ORAL_TABLET | Freq: Every day | ORAL | 0 refills | Status: DC

## 2022-01-01 NOTE — Telephone Encounter (Addendum)
Sent.  Thanks. Please check to pharmacy about this to make sure the sig is acceptable.   ?

## 2022-01-01 NOTE — Addendum Note (Signed)
Addended by: Tonia Ghent on: 01/01/2022 02:00 PM ? ? Modules accepted: Orders ? ?

## 2022-01-01 NOTE — Telephone Encounter (Signed)
Spoke with [facility] and they will need a new prescription in order to send out new dosing for patient. They can not send out extra pills without a prescription.  ?

## 2022-01-16 ENCOUNTER — Ambulatory Visit: Payer: Medicare Other | Admitting: Podiatry

## 2022-01-16 ENCOUNTER — Encounter: Payer: Self-pay | Admitting: Podiatry

## 2022-01-16 ENCOUNTER — Telehealth: Payer: Self-pay

## 2022-01-16 DIAGNOSIS — M79675 Pain in left toe(s): Secondary | ICD-10-CM

## 2022-01-16 DIAGNOSIS — B351 Tinea unguium: Secondary | ICD-10-CM

## 2022-01-16 DIAGNOSIS — M79674 Pain in right toe(s): Secondary | ICD-10-CM

## 2022-01-16 DIAGNOSIS — E119 Type 2 diabetes mellitus without complications: Secondary | ICD-10-CM

## 2022-01-16 NOTE — Chronic Care Management (AMB) (Signed)
? ? ?  Chronic Care Management ?Pharmacy Assistant  ? ?Name: Paul Terrell  MRN: [id number] DOB: 1931-06-12 ? ? ?Reason for Encounter: Reminder Call ?  ?Conditions to be addressed/monitored: ?HTN, HLD, and DMII ? ? ?Medications: ?Outpatient Encounter Medications as of 01/16/2022  ?Medication Sig  ? acetaminophen (TYLENOL) 500 MG tablet Take 500 mg by mouth every 6 (six) hours as needed (for pain or headaches).  ? amLODipine (NORVASC) 2.5 MG tablet Take 1 tablet (2.5 mg total) by mouth at bedtime.  ? aspirin 81 MG tablet Take 81 mg by mouth at bedtime.  ? blood glucose meter kit and supplies Dispense Accu-chek meter. Check sugar once daily. DX E11.9  ? donepezil (ARICEPT) 10 MG tablet TAKE ONE TABLET BY MOUTH EVERYDAY AT BEDTIME  ? donepezil (ARICEPT) 5 MG tablet Take 1 tablet (5 mg total) by mouth at bedtime. Add to current 16m dosing to make 113mdose at night.  ? ferrous sulfate 325 (65 FE) MG tablet Take 1 tablet (325 mg total) by mouth daily with breakfast.  ? finasteride (PROSCAR) 5 MG tablet Take 1 tablet (5 mg total) by mouth daily.  ? glyBURIDE (DIABETA) 5 MG tablet TAKE TWO TABLETS BY MOUTH ONCE DAILY WITH BREAKFAST  ? hydrochlorothiazide (MICROZIDE) 12.5 MG capsule Take 1 capsule (12.5 mg total) by mouth daily.  ? Insulin Pen Needle (PEN NEEDLES) 31G X 5 MM MISC Use daily with insulin pen.  ? Lancets (ONETOUCH ULTRASOFT) lancets USE TO test UP TO three times daily  ? LANTUS SOLOSTAR 100 UNIT/ML Solostar Pen INJECT 46 UNITS SUBCUTANEOUSLY DAILY  ? Melatonin 5 MG TABS Take 5 mg by mouth at bedtime as needed.  ? metoprolol succinate (TOPROL-XL) 50 MG 24 hr tablet Take 1 tablet (50 mg total) by mouth daily. Take with or immediately following a meal.  ? mirtazapine (REMERON) 7.5 MG tablet Take 1 tablet (7.5 mg total) by mouth at bedtime.  ? Multiple Vitamin (MULTIVITAMIN) tablet Take 1 tablet by mouth daily. breakfast  ? nitroGLYCERIN (NITROSTAT) 0.4 MG SL tablet Place 1 tablet (0.4 mg total) under the tongue every 5  (five) minutes as needed for chest pain (max 3 doses in 15 minutes.).  ? ONETOUCH VERIO test strip USE TO check blood sugar UP TO THREE TIMES DAILY  ? pantoprazole (PROTONIX) 40 MG tablet Take 1 tablet (40 mg total) by mouth daily.  ? pravastatin (PRAVACHOL) 40 MG tablet Take 1 tablet (40 mg total) by mouth at bedtime.  ? tamsulosin (FLOMAX) 0.4 MG CAPS capsule Take 1 capsule (0.4 mg total) by mouth at bedtime.  ? ?No facility-administered encounter medications on file as of 01/16/2022.  ? ? ?JoJAVONNIE ILLESCASas contacted to remind of upcoming telephone visit with LiCharlene Brooken 01/21/22 at 3:45pm. Patient was reminded to have any blood glucose and blood pressure readings available for review at appointment.  ? ?Patient confirmed appointment. ? ? ?Are you having any problems with your medications? No  ? ?Do you have any concerns you like to discuss with the pharmacist? No ? ? ? ?Star Rating Drugs: ?Medication:  Last Fill: Day Supply ?Pravastatin 4072m/7/23  71 ?Glyburide 5mg75m/10/23 90 ?Lantus   12/26/21  26 ? ?LindCharlene BrookeP notified ? ?Zanaiya Calabria, CCMA ?Health concierge  ?[phone]? ? ?

## 2022-01-16 NOTE — Progress Notes (Signed)
?  Subjective:  ?Patient ID: Paul Terrell, male    DOB: Feb 09, 1931,  MRN: [id number] ? ?Chief Complaint  ?Patient presents with  ? Nail Problem  ?  Nail trim   ? ?88 y.o. male returns for the above complaint.  Patient presents with thickened elongated dystrophic toenails x10.  Patient states that it is painful to touch.  He would like to have them debrided and he is not able to do it himself.  He is a diabetic with last A1c of 8% ? ?Objective:  ?There were no vitals filed for this visit. ?Podiatric Exam: ?Vascular: dorsalis pedis and posterior tibial pulses are palpable bilateral. Capillary return is immediate. Temperature gradient is WNL. Skin turgor WNL  ?Sensorium: Decreased Semmes Weinstein monofilament test.  Decreased actile sensation bilaterally. ?Nail Exam: Pt has thick disfigured discolored nails with subungual debris noted bilateral entire nail hallux through fifth toenails.  Pain on palpation to the nails. ?Ulcer Exam: There is no evidence of ulcer or pre-ulcerative changes or infection. ?Orthopedic Exam: Muscle tone and strength are WNL. No limitations in general ROM. No crepitus or effusions noted.  ?Skin: No Porokeratosis. No infection or ulcers ? ? ? ?Assessment & Plan:  ? ?1. Pain due to onychomycosis of toenails of both feet   ?2. Type 2 diabetes mellitus without complication, unspecified whether long term insulin use ([place])   ? ? ?Patient was evaluated and treated and all questions answered. ? ?Onychomycosis with pain  ?-Nails palliatively debrided as below. ?-Educated on self-care ? ?Procedure: Nail Debridement ?Rationale: pain  ?Type of Debridement: manual, sharp debridement. ?Instrumentation: Nail nipper, rotary burr. ?Number of Nails: 10 ? ?Procedures and Treatment: Consent by patient was obtained for treatment procedures. The patient understood the discussion of treatment and procedures well. All questions were answered thoroughly reviewed. Debridement of mycotic and hypertrophic toenails, 1  through 5 bilateral and clearing of subungual debris. No ulceration, no infection noted.  ?Return Visit-Office Procedure: Patient instructed to return to the office for a follow up visit 3 months for continued evaluation and treatment. ? ?Boneta Lucks, DPM ?  ? ?No follow-ups on file. ? ?

## 2022-01-21 ENCOUNTER — Ambulatory Visit (INDEPENDENT_AMBULATORY_CARE_PROVIDER_SITE_OTHER): Payer: Medicare Other | Admitting: Pharmacist

## 2022-01-21 DIAGNOSIS — I25118 Atherosclerotic heart disease of native coronary artery with other forms of angina pectoris: Secondary | ICD-10-CM

## 2022-01-21 DIAGNOSIS — E119 Type 2 diabetes mellitus without complications: Secondary | ICD-10-CM

## 2022-01-21 DIAGNOSIS — I10 Essential (primary) hypertension: Secondary | ICD-10-CM

## 2022-01-21 DIAGNOSIS — E782 Mixed hyperlipidemia: Secondary | ICD-10-CM

## 2022-01-21 DIAGNOSIS — R413 Other amnesia: Secondary | ICD-10-CM

## 2022-01-21 NOTE — Progress Notes (Signed)
Chronic Care Management Pharmacy Note  01/23/2022 Name:  Paul Terrell MRN:  [id number] DOB:  1930/10/29  Summary: CCM F/U visit -Pt c/o GI upset - switches between diarrhea and constipation. He eats a lot of sugar-free candy on a daily basis. He takes Pepto Bismol for diarrhea and it converts to constipation. -BP improved after addition of amlodipine 2.5 mg (Avg 140s/70s)  Recommendations/Changes made from today's visit: -Try Probitic and Fiber supplement (Metamucil or similar)  Plan: -[facility] Concierge will call patient 1 month for GI update -Pharmacist follow up televisit scheduled for 6 months -PCP f/u 05/06/22   Subjective: Paul Terrell is an 88 y.o. year old male who is a primary patient of Para March, Dwana Curd, MD.  The CCM team was consulted for assistance with disease management and care coordination needs.    Engaged with patient by telephone for follow up visit in response to provider referral for pharmacy case management and/or care coordination services.   Consent to Services:  The patient was given information about Chronic Care Management services, agreed to services, and gave verbal consent prior to initiation of services.  Please see initial visit note for detailed documentation.   Patient Care Team: Joaquim Nam, MD as PCP - General (Family Medicine) Croitoru, Rachelle Hora, MD as PCP - Cardiology (Cardiology) Othella Boyer, MD as Consulting Physician (Cardiology) Maris Berger, MD as Consulting Physician (Ophthalmology) Cherlyn Roberts, MD as Consulting Physician (Dermatology) Kathyrn Sheriff, [facility] as Pharmacist (Pharmacist)  Recent office visits: 12/27/21 Dr Para March OV: memory change - increase Donepezil to 10 mg HS  11/15/21 Dr Para March OV: AWV - Start donepezil 5 mg HS. Sent NTG. Labs stable. No other med changes.  Recent consult visits: 12/28/21 Dr Royann Shivers (Cardiology): f/u CAD, exertional chest pain. start amlodipine 2.5 mg daily HS; increase pravastatin  to 40 mg.  Hospital visits: None in previous 6 months   Objective:  Lab Results  Component Value Date   CREATININE 1.20 11/15/2021   BUN 16 11/15/2021   GFR 53.95 (L) 11/15/2021   GFRNONAA 58 (L) 08/25/2021   GFRAA >60 01/17/2020   NA 136 11/15/2021   K 4.5 11/15/2021   CALCIUM 9.4 11/15/2021   CO2 32 11/15/2021   GLUCOSE 203 (H) 11/15/2021    Lab Results  Component Value Date/Time   HGBA1C 8.0 (H) 11/15/2021 08:49 AM   HGBA1C 7.8 (A) 06/15/2021 08:44 AM   HGBA1C 8.5 (H) 03/09/2021 10:30 AM   GFR 53.95 (L) 11/15/2021 08:49 AM   GFR 47.07 (L) 03/09/2021 10:30 AM   MICROALBUR 6.4 (H) 11/06/2017 08:50 AM   MICROALBUR 6.2 (H) 08/05/2017 11:40 AM    Last diabetic Eye exam:  Lab Results  Component Value Date/Time   HMDIABEYEEXA No Retinopathy 08/21/2021 12:00 AM    Last diabetic Foot exam:  Lab Results  Component Value Date/Time   HMDIABFOOTEX Yes 08/22/2010 12:00 AM     Lab Results  Component Value Date   CHOL 155 11/15/2021   HDL 35.30 (L) 11/15/2021   LDLCALC 81 11/03/2020   LDLDIRECT 87.0 11/15/2021   TRIG 207.0 (H) 11/15/2021   CHOLHDL 4 11/15/2021       Latest Ref Rng & Units 11/15/2021    8:49 AM 08/25/2021    4:26 PM 11/03/2020    8:30 AM  Hepatic Function  Total Protein 6.0 - 8.3 g/dL 6.6   6.8   7.0    Albumin 3.5 - 5.2 g/dL 4.2   3.9   4.2  AST 0 - 37 U/L 31   31   34    ALT 0 - 53 U/L 38   36   39    Alk Phosphatase 39 - 117 U/L 81   77   72    Total Bilirubin 0.2 - 1.2 mg/dL 1.2   0.9   1.0      Lab Results  Component Value Date/Time   TSH 1.94 03/09/2021 10:30 AM   TSH 0.966 08/15/2019 08:44 PM   TSH 1.81 01/07/2011 08:35 AM       Latest Ref Rng & Units 11/15/2021    8:49 AM 08/25/2021    4:26 PM 03/09/2021   10:30 AM  CBC  WBC 4.0 - 10.5 K/uL 9.6   8.0   8.7    Hemoglobin 13.0 - 17.0 g/dL 40.9   81.1   91.4    Hematocrit 39.0 - 52.0 % 43.2   45.9   44.4    Platelets 150.0 - 400.0 K/uL 150.0   155   159.0      Lab Results   Component Value Date/Time   VD25OH 53.31 11/15/2021 08:49 AM   VD25OH 63.60 06/01/2020 01:43 PM    Clinical ASCVD: Yes  The ASCVD Risk score (Arnett DK, et al., 2019) failed to calculate for the following reasons:   The 2019 ASCVD risk score is only valid for ages 38 to 46       06/15/2021    8:43 AM 12/21/2019   12:17 PM 10/07/2019   11:43 AM  Depression screen PHQ 2/9  Decreased Interest 0 0 0  Down, Depressed, Hopeless 0 0 1  PHQ - 2 Score 0 0 1  Difficult doing work/chores   Not difficult at all     Social History   Tobacco Use  Smoking Status Former   Packs/day: 1.00   Years: 25.00   Pack years: 25.00   Types: Cigarettes   Quit date: 01/09/1976   Years since quitting: 46.0  Smokeless Tobacco Former   Types: Chew  Tobacco Comments   quit smoking about 25 years ago   BP Readings from Last 3 Encounters:  12/28/21 (!) 160/60  12/27/21 (!) 142/64  11/16/21 (!) 164/75   Pulse Readings from Last 3 Encounters:  12/28/21 (!) 50  12/27/21 64  11/16/21 71   Wt Readings from Last 3 Encounters:  12/28/21 184 lb 12.8 oz (83.8 kg)  12/27/21 183 lb (83 kg)  11/15/21 187 lb (84.8 kg)   BMI Readings from Last 3 Encounters:  12/28/21 28.10 kg/m  12/27/21 27.83 kg/m  11/15/21 28.43 kg/m    Assessment/Interventions: Review of patient past medical history, allergies, medications, health status, including review of consultants reports, laboratory and other test data, was performed as part of comprehensive evaluation and provision of chronic care management services.   SDOH:  (Social Determinants of Health) assessments and interventions performed: Yes SDOH Interventions    Flowsheet Row Most Recent Value  SDOH Interventions   Food Insecurity Interventions Intervention Not Indicated  Financial Strain Interventions Intervention Not Indicated      SDOH Screenings   Alcohol Screen: Not on file  Depression (PHQ2-9): Low Risk    PHQ-2 Score: 0  Financial Resource  Strain: Low Risk    Difficulty of Paying Living Expenses: Not very hard  Food Insecurity: No Food Insecurity   Worried About Programme researcher, broadcasting/film/video in the Last Year: Never true   Ran Out of Food in the Last Year:  Never true  Housing: Not on file  Physical Activity: Not on file  Social Connections: Not on file  Stress: Not on file  Tobacco Use: Medium Risk   Smoking Tobacco Use: Former   Smokeless Tobacco Use: Former   Passive Exposure: Not on Chartered certified accountant Needs: Not on file    CCM Care Plan  Allergies  Allergen Reactions   Nsaids Other (See Comments)    H/o anemia   Citrus Hives   Lisinopril     Presumed cause of cough   Metformin And Related     GI upset.     Zocor [Simvastatin] Other (See Comments)    Myalgia     Medications Reviewed Today     Reviewed by Kathyrn Sheriff, [facility] (Pharmacist) on 01/23/22 at 1629  Med List Status: <None>   Medication Order Taking? Sig Documenting Provider Last Dose Status Informant  acetaminophen (TYLENOL) 500 MG tablet [id number] Yes Take 500 mg by mouth every 6 (six) hours as needed (for pain or headaches). [provider] Taking Active Family Member  amLODipine (NORVASC) 2.5 MG tablet [id number] Yes Take 1 tablet (2.5 mg total) by mouth at bedtime. Croitoru, Mihai, MD Taking Active   aspirin 81 MG tablet [id number] Yes Take 81 mg by mouth at bedtime. [provider] Taking Active Family Member  bismuth subsalicylate (PEPTO BISMOL) 262 MG/15ML suspension [id number] Yes Take 30 mLs by mouth every 6 (six) hours as needed. [provider] Taking Active   blood glucose meter kit and supplies [id number] Yes Dispense Accu-chek meter. Check sugar once daily. DX E11.9 Joaquim Nam, MD Taking Active Family Member  donepezil (ARICEPT) 10 MG tablet [id number] Yes TAKE ONE TABLET BY MOUTH EVERYDAY AT BEDTIME Joaquim Nam, MD Taking Active   ferrous sulfate 325 (65 FE) MG tablet [id number] Yes Take 1 tablet (325 mg  total) by mouth daily with breakfast. Joaquim Nam, MD Taking Active Family Member  finasteride (PROSCAR) 5 MG tablet [id number] Yes Take 1 tablet (5 mg total) by mouth daily. Joaquim Nam, MD Taking Active   glyBURIDE (DIABETA) 5 MG tablet [id number] Yes TAKE TWO TABLETS BY MOUTH ONCE DAILY WITH BREAKFAST Joaquim Nam, MD Taking Active   hydrochlorothiazide (MICROZIDE) 12.5 MG capsule [id number] Yes Take 1 capsule (12.5 mg total) by mouth daily. Joaquim Nam, MD Taking Active   Insulin Pen Needle (PEN NEEDLES) 31G X 5 MM MISC [id number] Yes Use daily with insulin pen. Joaquim Nam, MD Taking Active   Lancets [facility] ULTRASOFT) lancets [id number] Yes USE TO test UP TO three times daily Joaquim Nam, MD Taking Active   LANTUS SOLOSTAR 100 UNIT/ML Solostar Pen [id number] Yes INJECT 46 UNITS SUBCUTANEOUSLY DAILY Joaquim Nam, MD Taking Active   Melatonin 5 MG TABS [id number] Yes Take 5 mg by mouth at bedtime as needed. [provider] Taking Active Family Member  metoprolol succinate (TOPROL-XL) 50 MG 24 hr tablet [id number] Yes Take 1 tablet (50 mg total) by mouth daily. Take with or immediately following a meal. Joaquim Nam, MD Taking Active   mirtazapine (REMERON) 7.5 MG tablet [id number] Yes Take 1 tablet (7.5 mg total) by mouth at bedtime. Joaquim Nam, MD Taking Active   Multiple Vitamin (MULTIVITAMIN) tablet [id number] Yes Take 1 tablet by mouth daily. breakfast [provider] Taking Active Family Member  nitroGLYCERIN (NITROSTAT) 0.4 MG SL tablet [id number] Yes Place 1 tablet (0.4 mg total) under the tongue every 5 (five) minutes  as needed for chest pain (max 3 doses in 15 minutes.). Joaquim Nam, MD Taking Active   [facility] VERIO test strip [id number] Yes USE TO check blood sugar UP TO THREE TIMES DAILY Joaquim Nam, MD Taking Active   pantoprazole (PROTONIX) 40 MG tablet [id number] Yes Take 1 tablet (40 mg total) by mouth daily. Joaquim Nam, MD Taking Active   pravastatin (PRAVACHOL) 40 MG tablet [id number] Yes Take 1 tablet (40 mg total) by mouth at bedtime. Croitoru, Mihai, MD Taking Active   tamsulosin (FLOMAX) 0.4 MG CAPS capsule [id number] Yes Take 1 capsule (0.4 mg total) by mouth at bedtime. Joaquim Nam, MD Taking Active             Patient Active Problem List   Diagnosis Date Noted   Hearing loss 02/19/2021   Risk for falls 11/22/2020   Memory change 11/22/2020   Compression fracture of L1 lumbar vertebra (HCC) 05/08/2020   Lower back pain 04/23/2020   Aortic aneurysm (HCC) 10/20/2019   GERD (gastroesophageal reflux disease)    Thrombocytopenia (HCC)    Anemia 05/14/2018   Hypertension 05/14/2018   Unsteadiness on feet 11/05/2017   Spinal stenosis of lumbar region 11/08/2015   Advance care planning 11/04/2014   Leg pain 01/30/2014   Medicare annual wellness visit, subsequent 11/01/2013   BPH (benign prostatic hyperplasia)    Diabetes mellitus, type II (HCC)    Hyperlipidemia    Hypertensive heart disease    CAD (coronary artery disease)     Immunization History  Administered Date(s) Administered   Fluad Quad(high Dose 65+) 05/28/2019, 06/15/2021   Influenza Whole 07/02/2006, 07/31/2007, 06/28/2008, 06/22/2009   Influenza,inj,Quad PF,6+ Mos 06/09/2015, 07/04/2016, 05/28/2018   Influenza-Unspecified 07/19/2014, 07/07/2017, 07/22/2019, 06/23/2020   PFIZER Comirnaty(Gray Top)Covid-19 Tri-Sucrose Vaccine 05/01/2021   PFIZER(Purple Top)SARS-COV-2 Vaccination 10/25/2019, 11/21/2019, 07/25/2020   Pneumococcal Conjugate-13 06/09/2015   Pneumococcal Polysaccharide-23 08/13/1999   Td 08/02/2004   Tdap 11/22/2014    Conditions to be addressed/monitored:  Hypertension, Hyperlipidemia, Diabetes, and Coronary Artery Disease, Dementia, Insomnia  Care Plan : CCM Pharmacy Care Plan  Updates made by Kathyrn Sheriff, RPH since 01/23/2022 12:00 AM     Problem: Hypertension, Hyperlipidemia, Diabetes, and  Coronary Artery Disease, Dementia, Insomnia   Priority: High     Long-Range Goal: Disease Management   Start Date: 12/04/2020  Expected End Date: 01/24/2023  This Visit's Progress: On track  Priority: High  Note:   Current Barriers:  Diarrhea and medication-induced constipation  Pharmacist Clinical Goal(s):  Patient will contact provider office for questions/concerns as evidenced notation of same in electronic health record through collaboration with PharmD and provider.   Interventions: 1:1 collaboration with Joaquim Nam, MD regarding development and update of comprehensive plan of care as evidenced by provider attestation and co-signature Inter-disciplinary care team collaboration (see longitudinal plan of care) Comprehensive medication review performed; medication list updated in electronic medical record  Hypertension (BP goal < 150/90) -Controlled - BP at goal based on home readings, improved since recent addition of amlodipine; BP goal <150/90 due to comorbidity/fall risk -Current home readings: 140/68 59; 138/84 56; 140/77 65; 152/78 68; 146/81 82 -Denies any recent falls or dizziness. Reports history of falls. Walks with a cane. Does not drive. -Current treatment: HCTZ 12.5 mg daily AM - Appropriate, Effective, Safe, Accessible Metoprolol succinate 50 mg daily AM -Appropriate, Effective, Safe, Accessible Amlodipine 2.5 mg daily -Appropriate, Effective, Safe, Accessible -Medications previously tried: Lisinopril (cough) -Current dietary habits: sitter cooks breakfast  and lunch, son brings dinner; denies any sugary drinks; snacks on cookies, friends bring pies and treats by often  -Current exercise habits: minimal -Educated on Symptoms of hypotension and importance of maintaining adequate hydration; -Recommended to continue current medication  Hyperlipidemia: (LDL goal < 70, CAD) -Query controlled- LDL 87 (10/2021), pravastatin was increased after this result, lipids have  not been re-checked yet; pt is compliant with 40 mg/day and denies side effects -Hx CAD - stent 2001; follows with cardiology (Dr Royann Shivers) -Current treatment: Pravastatin 40 mg daily HS - Appropriate, Query Effective Aspirin 81 mg daily HS - Appropriate, Effective, Safe, Accessible NTG 0.4 mg SL prn -Appropriate, Effective, Safe, Accessible -Medications previously tried: simvastatin (myalgias) -Prefer to avoid additional treament considering age/limited benefit -Recommended to continue current medication; repeat lipid panel at next PCP visit  Diabetes (A1c goal <8%) -Controlled - A1c 8.0% (10/2021) at goal; pt denies hypoglycemia -Current home glucose readings - checks every morning fasting glucose: 175, 94, 92, 318, 102, 119 -Current medications: Lantus Solostar 46 units daily HS - Appropriate, Effective, Safe, Accessible Glyburide 5 mg - 2 tablets daily AM - Appropriate, Effective, Safe, Accessible -Medications previously tried: metformin (GI upset) -Educated on Prevention and management of hypoglycemic episodes; -Counseled to check feet daily and get yearly eye exams -Recommended to continue current medication  Insomnia (Goal: Improve sleep) -Controlled - son Thayer Ohm reports pt has been sleeping relatively well lately; BMI 27, weight is stable. Thayer Ohm reports good appetite -Denies concerns today -Current treatment: Mirtazapine 7.5 mg daily HS - Appropriate, Effective, Safe, Accessible Melatonin 5 mg daily HS PRN - Appropriate, Effective, Safe, Accessible -Medications previously tried/failed: none reported -Continue to monitor for risk/benefits. -Recommended to continue current medication  BPH (Goal: Control symptoms) -Controlled  -Denies concerns today -Current treatment  Finasteride 5 mg daily - Appropriate, Effective, Safe, Accessible Tamsulosin 0.4 mg daily - Appropriate, Effective, Safe, Accessible -Medications previously tried:none reported -Recommended to continue current  medication  Dementia (Goal: manage symptoms) -Controlled -Current treatment  Donepezil 10 mg daily HS - Appropriate, Effective, Safe, Accessible -Medications previously tried: n/a  -Reviewed indication for donepezil and benefits for slowing progression of impairment -Recommended to continue current medication  Constipation/Diarrhea (Goal: manage symptom) -Uncontrolled - pt reports alternating issues with diarrhea and constipation - he takes Pepto Bismol for diarrhea and ends up with constipation from that; pt reports this has been a chronic issue for many years -Pt endorses eating many sugar-free candies daily (goes through a bag every 1-2 days); discussed sugar substitutes commonly cause GI upset/diarrhea and this is very likely contributing to his issues -Current treatment  Pepto Bismol PRN - Appropriate, Effective, Query Safe -Medications previously tried: n/a -Reviewed benefits of Probitic and fiber in diet to improve regularity; advised trial of OTC probiotic and fiber supplement -Advised to limit sugar-free candies  Patient Goals/Self-Care Activities Patient will:  - take medications as prescribed as evidenced by patient report and record review focus on medication adherence by routine -Try probiotic +/- fiber supplement -Limit sugar free candy      Medication Assistance: None required.  Patient affirms current coverage meets needs.  Compliance/Adherence/Medication fill history: Care Gaps: None  Star-Rating Drugs: Pravastatin - PDC 100% Glyburide - PDC 100%  Patient's preferred pharmacy is:  [id number] [place] Otto, [place] - [address] [place] [address] Phone: [phone] Fax: [phone]  [facility] - [place], [place] - [address] [place] [address] Phone: [phone] Fax: [phone]  Uses pill box? Yes Pt endorses 100%  compliance  We discussed: Current pharmacy is preferred with insurance plan and patient is satisfied with pharmacy services Patient decided to: Utilize [facility] for medication synchronization, packaging and delivery  Care Plan and Follow Up Patient Decision:  Patient agrees to Care Plan and Follow-up.  Plan: Telephone follow up appointment with care management team member scheduled for:  6 months  Al Corpus, PharmD, BCACP Clinical Pharmacist [facility] at [facility] [phone]

## 2022-01-23 NOTE — Patient Instructions (Signed)
Visit Information ? ?Phone number for Pharmacist: [phone] ? ? Goals Addressed   ?None ?  ? ? ?Care Plan : [place]  ?Updates made by Charlton Haws, RPH since 01/23/2022 12:00 AM  ?  ? ?Problem: Hypertension, Hyperlipidemia, Diabetes, and Coronary Artery Disease, Dementia, Insomnia   ?Priority: High  ?  ? ?Long-Range Goal: Disease Management   ?Start Date: 12/04/2020  ?Expected End Date: 01/24/2023  ?This Visit's Progress: On track  ?Priority: High  ?Note:   ?Current Barriers:  ?Diarrhea and medication-induced constipation ? ?Pharmacist Clinical Goal(s):  ?Patient will contact provider office for questions/concerns as evidenced notation of same in electronic health record through collaboration with PharmD and provider.  ? ?Interventions: ?1:1 collaboration with Tonia Ghent, MD regarding development and update of comprehensive plan of care as evidenced by provider attestation and co-signature ?Inter-disciplinary care team collaboration (see longitudinal plan of care) ?Comprehensive medication review performed; medication list updated in electronic medical record ? ?Hypertension (BP goal < 150/90) ?-Controlled - BP at goal based on home readings, improved since recent addition of amlodipine; BP goal <150/90 due to comorbidity/fall risk ?-Current home readings: 140/68 59; 138/84 56; 140/77 65; 152/78 68; 146/81 82 ?-Denies any recent falls or dizziness. Reports history of falls. Walks with a cane. Does not drive. ?-Current treatment: ?HCTZ 12.5 mg daily AM - Appropriate, Effective, Safe, Accessible ?Metoprolol succinate 50 mg daily AM -Appropriate, Effective, Safe, Accessible ?Amlodipine 2.5 mg daily -Appropriate, Effective, Safe, Accessible ?-Medications previously tried: Lisinopril (cough) ?-Current dietary habits: sitter cooks breakfast and lunch, son brings dinner; denies any sugary drinks; snacks on cookies, friends bring pies and treats by often  ?-Current exercise habits:  minimal ?-Educated on Symptoms of hypotension and importance of maintaining adequate hydration; ?-Recommended to continue current medication ? ?Hyperlipidemia: (LDL goal < 70, CAD) ?-Query controlled- LDL 87 (10/2021), pravastatin was increased after this result, lipids have not been re-checked yet; pt is compliant with 40 mg/day and denies side effects ?-Hx CAD - stent 2001; follows with cardiology (Dr Sallyanne Kuster) ?-Current treatment: ?Pravastatin 40 mg daily HS - Appropriate, Query Effective ?Aspirin 81 mg daily HS - Appropriate, Effective, Safe, Accessible ?NTG 0.4 mg SL prn -Appropriate, Effective, Safe, Accessible ?-Medications previously tried: simvastatin (myalgias) ?-Prefer to avoid additional treament considering age/limited benefit ?-Recommended to continue current medication; repeat lipid panel at next PCP visit ? ?Diabetes (A1c goal <8%) ?-Controlled - A1c 8.0% (10/2021) at goal; pt denies hypoglycemia ?-Current home glucose readings - checks every morning ?fasting glucose: 175, 94, 92, 318, 102, 119 ?-Current medications: ?Lantus Solostar 46 units daily HS - Appropriate, Effective, Safe, Accessible ?Glyburide 5 mg - 2 tablets daily AM - Appropriate, Effective, Safe, Accessible ?-Medications previously tried: metformin (GI upset) ?-Educated on Prevention and management of hypoglycemic episodes; ?-Counseled to check feet daily and get yearly eye exams ?-Recommended to continue current medication ? ?Insomnia (Goal: Improve sleep) ?-Controlled - son [name] reports pt has been sleeping relatively well lately; BMI 27, weight is stable. [name] reports good appetite ?-Denies concerns today ?-Current treatment: ?Mirtazapine 7.5 mg daily HS - Appropriate, Effective, Safe, Accessible ?Melatonin 5 mg daily HS PRN - Appropriate, Effective, Safe, Accessible ?-Medications previously tried/failed: none reported ?-Continue to monitor for risk/benefits. ?-Recommended to continue current medication ? ?BPH (Goal: Control  symptoms) ?-Controlled  ?-Denies concerns today ?-Current treatment  ?Finasteride 5 mg daily - Appropriate, Effective, Safe, Accessible ?Tamsulosin 0.4 mg daily - Appropriate, Effective, Safe, Accessible ?-Medications previously tried:none reported ?-Recommended to continue current medication ? ?  Dementia (Goal: manage symptoms) ?-Controlled ?-Current treatment  ?Donepezil 10 mg daily HS - Appropriate, Effective, Safe, Accessible ?-Medications previously tried: n/a  ?-Reviewed indication for donepezil and benefits for slowing progression of impairment ?-Recommended to continue current medication ? ?Constipation/Diarrhea (Goal: manage symptom) ?-Uncontrolled - pt reports alternating issues with diarrhea and constipation - he takes Pepto Bismol for diarrhea and ends up with constipation from that; pt reports this has been a chronic issue for many years ?-Pt endorses eating many sugar-free candies daily (goes through a bag every 1-2 days); discussed sugar substitutes commonly cause GI upset/diarrhea and this is very likely contributing to his issues ?-Current treatment  ?Pepto Bismol PRN - Appropriate, Effective, Query Safe ?-Medications previously tried: n/a ?-Reviewed benefits of Probitic and fiber in diet to improve regularity; advised trial of OTC probiotic and fiber supplement ?-Advised to limit sugar-free candies ? ?Patient Goals/Self-Care Activities ?Patient will:  ?- take medications as prescribed as evidenced by patient report and record review ?focus on medication adherence by routine ?-Try probiotic +/- fiber supplement ?-Limit sugar free candy ?  ?  ? ?Patient verbalizes understanding of instructions and care plan provided today and agrees to view in [place]. Active MyChart status confirmed with patient.   ?Telephone follow up appointment with pharmacy team member scheduled for: 6 months ? ?Charlene Brooke, PharmD, BCACP ?Clinical Pharmacist ?[facility] at [facility] ?[phone] ?  ?

## 2022-02-03 ENCOUNTER — Other Ambulatory Visit: Payer: Self-pay | Admitting: Family Medicine

## 2022-02-20 DIAGNOSIS — E1169 Type 2 diabetes mellitus with other specified complication: Secondary | ICD-10-CM

## 2022-02-20 DIAGNOSIS — Z794 Long term (current) use of insulin: Secondary | ICD-10-CM

## 2022-02-20 DIAGNOSIS — Z7984 Long term (current) use of oral hypoglycemic drugs: Secondary | ICD-10-CM

## 2022-02-20 DIAGNOSIS — E785 Hyperlipidemia, unspecified: Secondary | ICD-10-CM

## 2022-02-20 DIAGNOSIS — Z87891 Personal history of nicotine dependence: Secondary | ICD-10-CM | POA: Diagnosis not present

## 2022-02-20 DIAGNOSIS — F0390 Unspecified dementia without behavioral disturbance: Secondary | ICD-10-CM

## 2022-02-20 DIAGNOSIS — N400 Enlarged prostate without lower urinary tract symptoms: Secondary | ICD-10-CM | POA: Diagnosis not present

## 2022-02-20 DIAGNOSIS — I10 Essential (primary) hypertension: Secondary | ICD-10-CM

## 2022-02-26 ENCOUNTER — Telehealth: Payer: Self-pay

## 2022-02-26 NOTE — Chronic Care Management (AMB) (Signed)
Chronic Care Management Pharmacy Assistant   Name: Paul Terrell  MRN: [id number] DOB: 25-Oct-1930  Reason for Encounter: General Adherence   Recent office visits:  None since last CCM contact  Recent consult visits:  None since last CCM contact  Hospital visits:  None in previous 6 months  Medications: Outpatient Encounter Medications as of 02/26/2022  Medication Sig   acetaminophen (TYLENOL) 500 MG tablet Take 500 mg by mouth every 6 (six) hours as needed (for pain or headaches).   amLODipine (NORVASC) 2.5 MG tablet Take 1 tablet (2.5 mg total) by mouth at bedtime.   aspirin 81 MG tablet Take 81 mg by mouth at bedtime.   bismuth subsalicylate (PEPTO BISMOL) 262 MG/15ML suspension Take 30 mLs by mouth every 6 (six) hours as needed.   blood glucose meter kit and supplies Dispense Accu-chek meter. Check sugar once daily. DX E11.9   donepezil (ARICEPT) 10 MG tablet TAKE ONE TABLET BY MOUTH EVERYDAY AT BEDTIME   ferrous sulfate 325 (65 FE) MG tablet Take 1 tablet (325 mg total) by mouth daily with breakfast.   finasteride (PROSCAR) 5 MG tablet Take 1 tablet (5 mg total) by mouth daily.   glyBURIDE (DIABETA) 5 MG tablet TAKE TWO TABLETS BY MOUTH ONCE DAILY WITH BREAKFAST   hydrochlorothiazide (MICROZIDE) 12.5 MG capsule Take 1 capsule (12.5 mg total) by mouth daily.   Insulin Pen Needle (PEN NEEDLES) 31G X 5 MM MISC Use daily with insulin pen.   Lancets (ONETOUCH ULTRASOFT) lancets USE TO test UP TO three times daily   LANTUS SOLOSTAR 100 UNIT/ML Solostar Pen INJECT 46 UNITS SUBCUTANEOUSLY DAILY   Melatonin 5 MG TABS Take 5 mg by mouth at bedtime as needed.   metoprolol succinate (TOPROL-XL) 50 MG 24 hr tablet Take 1 tablet (50 mg total) by mouth daily. Take with or immediately following a meal.   mirtazapine (REMERON) 7.5 MG tablet Take 1 tablet (7.5 mg total) by mouth at bedtime.   Multiple Vitamin (MULTIVITAMIN) tablet Take 1 tablet by mouth daily. breakfast   nitroGLYCERIN  (NITROSTAT) 0.4 MG SL tablet Place 1 tablet (0.4 mg total) under the tongue every 5 (five) minutes as needed for chest pain (max 3 doses in 15 minutes.).   ONETOUCH VERIO test strip USE TO check blood sugar UP TO THREE TIMES DAILY   pantoprazole (PROTONIX) 40 MG tablet Take 1 tablet (40 mg total) by mouth daily.   pravastatin (PRAVACHOL) 40 MG tablet Take 1 tablet (40 mg total) by mouth at bedtime.   tamsulosin (FLOMAX) 0.4 MG CAPS capsule Take 1 capsule (0.4 mg total) by mouth at bedtime.   No facility-administered encounter medications on file as of 02/26/2022.     [place] on 6/623 for general disease state and medication adherence call.   Patient is not more than 5 days past due for refill on the following medications per chart history:  [facility] Medications: Medication Name/mg Last Fill Days Supply Lantus    02/11/22 26 Pravastatin 63m  12/28/21  71   Glyburide 525m  11/30/21 90    What concerns do you have about your medications? The family says the increase in donepezil has not helped and actually has irritated the patient.  The patient reports the following and denies side effects with their medications. Son ChGerald Stabstates JoMaesons angry after taking the increase in donepezil  How often do you forget or accidentally miss a dose? Rarely  The patient is getting fussy  about the memory medication he is taking.  Are you having any problems getting your medications from your pharmacy? No The patient is in packaging with [facility]   Has the cost of your medications been a concern? No  Since last visit with CPP, no interventions have been made. -Try Probitic and Fiber supplement (Metamucil or similar)-Family is using fiber and GI improved  The patient has not had an ED visit since last contact.   The patient reports the following problems with their health.   Patient denies concerns or questions for Charlene Brooke, PharmD at this time.   Counseled patient on:   Importance of taking medication daily without missed doses and Access to CCM team for any cost, medication or pharmacy concerns.   Care Gaps: Annual wellness visit in last year? Yes Most Recent BP reading:160/6050-P  12/28/21 (cardio office)  Upcoming appointments: PCP appointment on 05/06/22 and CCM appointment on 07/24/22    Charlene Brooke, CPP notified  Avel Sensor, [place]  [phone]

## 2022-02-26 NOTE — Chronic Care Management (AMB) (Signed)
Chronic Care Management Pharmacy Assistant   Name: Paul Terrell  MRN: [id number] DOB: 03/26/1931   Reason for Encounter: Medication Adherence and Delivery Coordination    CPP   Recent office visits:  None since last CCM contact  Recent consult visits:  None since last CCM contact  Hospital visits:  None in previous 6 months  Medications: Outpatient Encounter Medications as of 02/26/2022  Medication Sig   acetaminophen (TYLENOL) 500 MG tablet Take 500 mg by mouth every 6 (six) hours as needed (for pain or headaches).   amLODipine (NORVASC) 2.5 MG tablet Take 1 tablet (2.5 mg total) by mouth at bedtime.   aspirin 81 MG tablet Take 81 mg by mouth at bedtime.   bismuth subsalicylate (PEPTO BISMOL) 262 MG/15ML suspension Take 30 mLs by mouth every 6 (six) hours as needed.   blood glucose meter kit and supplies Dispense Accu-chek meter. Check sugar once daily. DX E11.9   donepezil (ARICEPT) 10 MG tablet TAKE ONE TABLET BY MOUTH EVERYDAY AT BEDTIME   ferrous sulfate 325 (65 FE) MG tablet Take 1 tablet (325 mg total) by mouth daily with breakfast.   finasteride (PROSCAR) 5 MG tablet Take 1 tablet (5 mg total) by mouth daily.   glyBURIDE (DIABETA) 5 MG tablet TAKE TWO TABLETS BY MOUTH ONCE DAILY WITH BREAKFAST   hydrochlorothiazide (MICROZIDE) 12.5 MG capsule Take 1 capsule (12.5 mg total) by mouth daily.   Insulin Pen Needle (PEN NEEDLES) 31G X 5 MM MISC Use daily with insulin pen.   Lancets (ONETOUCH ULTRASOFT) lancets USE TO test UP TO three times daily   LANTUS SOLOSTAR 100 UNIT/ML Solostar Pen INJECT 46 UNITS SUBCUTANEOUSLY DAILY   Melatonin 5 MG TABS Take 5 mg by mouth at bedtime as needed.   metoprolol succinate (TOPROL-XL) 50 MG 24 hr tablet Take 1 tablet (50 mg total) by mouth daily. Take with or immediately following a meal.   mirtazapine (REMERON) 7.5 MG tablet Take 1 tablet (7.5 mg total) by mouth at bedtime.   Multiple Vitamin (MULTIVITAMIN) tablet Take 1 tablet by mouth  daily. breakfast   nitroGLYCERIN (NITROSTAT) 0.4 MG SL tablet Place 1 tablet (0.4 mg total) under the tongue every 5 (five) minutes as needed for chest pain (max 3 doses in 15 minutes.).   ONETOUCH VERIO test strip USE TO check blood sugar UP TO THREE TIMES DAILY   pantoprazole (PROTONIX) 40 MG tablet Take 1 tablet (40 mg total) by mouth daily.   pravastatin (PRAVACHOL) 40 MG tablet Take 1 tablet (40 mg total) by mouth at bedtime.   tamsulosin (FLOMAX) 0.4 MG CAPS capsule Take 1 capsule (0.4 mg total) by mouth at bedtime.   No facility-administered encounter medications on file as of 02/26/2022.    BP Readings from Last 3 Encounters:  12/28/21 (!) 160/60  12/27/21 (!) 142/64  11/16/21 (!) 164/75    Lab Results  Component Value Date   HGBA1C 8.0 (H) 11/15/2021      No OVs, Consults, or hospital visits since last care coordination call / Pharmacist visit. No medication changes indicated   Last adherence delivery date:12/06/21      Patient is due for next adherence delivery on: 03/07/22  Spoke with patient on 02/26/22 reviewed medications and coordinated delivery.  This delivery to include: Adherence Packaging  90 Days  Packs: OTC aspirin 81 mg - 1 tablet daily at bedtime OTC ferrous sulfate 325 mg - 1 tablet every morning OTC Mens Multivitamin 50 + - 1  tablet every morning Glyburide 5 mg - 2 tablets daily with breakfast Pravastatin 69m 1 tablet at bedtime Tamsulosin 0.420m 1 tablet at bedtime Finasteride 57m457m1 tablet at bedtime Mirtazapine 7.57mg88mtablet at bedtime Metoprolol succinate ER 50 mg - 1 tablet at breakfast HCTZ 12.57mg 43m tablet at breakfast Pantoprazole 40mg 87mke 1 tablet at Breakfast Amlodipine 2.57mg-ta28m1 tablet bedtime  VIAL medications: Lantus inj solostar inject 46 units once daily One touch lancets             Test Strips  Donepezil 10mg- t757m1 tablet at bedtime -family requests in a vial due to patient is unable to tolerate all the time    Patient  declined the following medications this month:  Comfort EZ pen needles- not due yet   Any concerns about your medications? No  How often do you forget or accidentally miss a dose? Rarely  Is patient in packaging Yes  What is the date on your next pill pack? The son Chris, iGerald Stabs home to read the pack date   Any concerns or issues with your packaging?  The family likes the packaging    Refills requested from providers include:  donepezil increase , RX needed for [facility] Delivery  Confirmed delivery date of 03/07/22, advised patient that pharmacy will contact them the morning of delivery.  Recent blood pressure readings are as follows:none available   Recent blood glucose readings are as follows: none available    Annual wellness visit in last year? Yes Most Recent BP reading:  142/64  64-P   12/27/21  Lindsey Charlene Brooketified  Kimori Tartaglia Avel [phone]

## 2022-02-28 ENCOUNTER — Other Ambulatory Visit: Payer: Self-pay | Admitting: Family Medicine

## 2022-02-28 MED ORDER — DONEPEZIL HCL 5 MG PO TABS: ORAL_TABLET | ORAL | 1 refills | Status: DC

## 2022-02-28 NOTE — Telephone Encounter (Addendum)
Patient's family reports higher dose of donepezil 10 mg led to increased irritability at night. I recommend to reduce dose back to 5 mg, he appeared to tolerate that better.

## 2022-02-28 NOTE — Addendum Note (Signed)
Addended by: Charlton Haws on: 02/28/2022 08:37 AM   Modules accepted: Orders

## 2022-02-28 NOTE — Telephone Encounter (Signed)
Agreed.  Sent. Thanks.   

## 2022-02-28 NOTE — Addendum Note (Signed)
Addended by: Tonia Ghent on: 02/28/2022 01:30 PM   Modules accepted: Orders

## 2022-04-19 ENCOUNTER — Ambulatory Visit: Payer: Medicare Other | Admitting: Podiatry

## 2022-05-06 ENCOUNTER — Encounter: Payer: Self-pay | Admitting: Family Medicine

## 2022-05-06 ENCOUNTER — Ambulatory Visit (INDEPENDENT_AMBULATORY_CARE_PROVIDER_SITE_OTHER): Payer: Medicare Other | Admitting: Family Medicine

## 2022-05-06 VITALS — BP 130/78 | HR 75 | Temp 97.9°F | Ht 68.0 in | Wt 181.0 lb

## 2022-05-06 DIAGNOSIS — L989 Disorder of the skin and subcutaneous tissue, unspecified: Secondary | ICD-10-CM

## 2022-05-06 DIAGNOSIS — E119 Type 2 diabetes mellitus without complications: Secondary | ICD-10-CM | POA: Diagnosis not present

## 2022-05-06 DIAGNOSIS — D649 Anemia, unspecified: Secondary | ICD-10-CM | POA: Diagnosis not present

## 2022-05-06 DIAGNOSIS — R413 Other amnesia: Secondary | ICD-10-CM | POA: Diagnosis not present

## 2022-05-06 DIAGNOSIS — I25118 Atherosclerotic heart disease of native coronary artery with other forms of angina pectoris: Secondary | ICD-10-CM | POA: Diagnosis not present

## 2022-05-06 DIAGNOSIS — Z862 Personal history of diseases of the blood and blood-forming organs and certain disorders involving the immune mechanism: Secondary | ICD-10-CM | POA: Diagnosis not present

## 2022-05-06 DIAGNOSIS — M7060 Trochanteric bursitis, unspecified hip: Secondary | ICD-10-CM

## 2022-05-06 DIAGNOSIS — T63461A Toxic effect of venom of wasps, accidental (unintentional), initial encounter: Secondary | ICD-10-CM

## 2022-05-06 LAB — CBC WITH DIFFERENTIAL/PLATELET
Basophils Absolute: 0.1 10*3/uL (ref 0.0–0.1)
Basophils Relative: 1.1 % (ref 0.0–3.0)
Eosinophils Absolute: 0.2 10*3/uL (ref 0.0–0.7)
Eosinophils Relative: 2 % (ref 0.0–5.0)
HCT: 43.3 % (ref 39.0–52.0)
Hemoglobin: 15.1 g/dL (ref 13.0–17.0)
Lymphocytes Relative: 26.2 % (ref 12.0–46.0)
Lymphs Abs: 2.1 10*3/uL (ref 0.7–4.0)
MCHC: 34.8 g/dL (ref 30.0–36.0)
MCV: 95.8 fl (ref 78.0–100.0)
Monocytes Absolute: 0.9 10*3/uL (ref 0.1–1.0)
Monocytes Relative: 10.7 % (ref 3.0–12.0)
Neutro Abs: 4.9 10*3/uL (ref 1.4–7.7)
Neutrophils Relative %: 60 % (ref 43.0–77.0)
Platelets: 160 10*3/uL (ref 150.0–400.0)
RBC: 4.52 Mil/uL (ref 4.22–5.81)
RDW: 13.8 % (ref 11.5–15.5)
WBC: 8.1 10*3/uL (ref 4.0–10.5)

## 2022-05-06 LAB — BASIC METABOLIC PANEL
BUN: 18 mg/dL (ref 6–23)
CO2: 27 mEq/L (ref 19–32)
Calcium: 8.9 mg/dL (ref 8.4–10.5)
Chloride: 96 mEq/L (ref 96–112)
Creatinine, Ser: 1.12 mg/dL (ref 0.40–1.50)
GFR: 58.42 mL/min — ABNORMAL LOW (ref >60.00)
Glucose, Bld: 274 mg/dL — ABNORMAL HIGH (ref 70–99)
Potassium: 3.6 mEq/L (ref 3.5–5.1)
Sodium: 134 mEq/L — ABNORMAL LOW (ref 135–145)

## 2022-05-06 LAB — HEMOGLOBIN A1C: Hgb A1c MFr Bld: 8.5 % — ABNORMAL HIGH (ref 4.6–6.5)

## 2022-05-06 LAB — IRON: Iron: 95 ug/dL (ref 42–165)

## 2022-05-06 LAB — VITAMIN B12: Vitamin B-12: 501 pg/mL (ref 211–911)

## 2022-05-06 NOTE — Progress Notes (Unsigned)
Diabetes:  Using medications without difficulties:yes Hypoglycemic episodes:no Hyperglycemic episodes:no Feet problems:  no ulceration but pain with walking.   Blood Sugars averaging: ~110 eye exam within last year: yes Labs pending.    He wanted to get set up with new dermatology clinic.  Lesion on the L side of the face, recently noted.  See exam.  Referral placed.    H/o anemia, still on iron.  Labs pending.    Memory follow up.  Discussed his situation with his wife, "she works on my nerves."  Her memory is worse per family report and his hearing is not good.  D/w pt about hearing aid follow up, son is going to call.  Still on donepezil.  No ADE with '5mg'$  dose.   He noted SOB and CP when working outside or after the fact.  Intermittent.  Wife gave him NTG with the events.  NTG cautions d/w pt.  Improved with rest.  Not more often per patient report.  Happening about once a week.  He had cardiology eval in the meantime.  No symptoms now.  He had long standing R knee pain and "it's not worse than it used to be" but he fell about 1 week ago and hit his L hip.  Fall cautions d/w pt.  Was going out of a door and stumbled on the threshold.  He has a fall button but didn't want to wear it.  Contact info given to patient and family.  Tylenol helps the pain.  Cautions d/w pt.    Yellow jacket stings on the R hand and ankles, 2 days ago.  D/w pt about avoidance, they are in his well house. No systemic sx.  He took benadryl but was drowsy.  Family removed the med in the meantime.    Meds, vitals, and allergies reviewed.   ROS: Per HPI unless specifically indicated in ROS section   GEN: nad, alert and HOH HEENT: ncat NECK: supple w/o LA CV: rrr. PULM: ctab, no inc wob ABD: soft, +bs EXT: no edema SKIN: no acute rash but resolving bites notes on the R hand.  Likely AK noted on the L side of the face.   Minimal discomfort L hip int rotation.  TTP near the greater trochanter.  Able to bear  weight.

## 2022-05-06 NOTE — Patient Instructions (Addendum)
Call [phone] for details about a fall button.   If the chest pain is worse, then go to the ER or call cardiology.   Go to the lab on the way out.   If you have mychart we'll likely use that to update you.  Take care.  Glad to see you. Keep using ice for 5 minutes at a time on your hip.   Plan on recheck in about 4 months, sooner if needed.  Labs at the visit.

## 2022-05-08 DIAGNOSIS — L989 Disorder of the skin and subcutaneous tissue, unspecified: Secondary | ICD-10-CM | POA: Insufficient documentation

## 2022-05-08 DIAGNOSIS — T63461A Toxic effect of venom of wasps, accidental (unintentional), initial encounter: Secondary | ICD-10-CM | POA: Insufficient documentation

## 2022-05-08 DIAGNOSIS — M7060 Trochanteric bursitis, unspecified hip: Secondary | ICD-10-CM | POA: Insufficient documentation

## 2022-05-08 NOTE — Assessment & Plan Note (Signed)
Refer to dermatology 

## 2022-05-08 NOTE — Assessment & Plan Note (Signed)
Would continue donepezil for now and discussed with patient/son about getting his hearing aids fixed.  Support offered.

## 2022-05-08 NOTE — Assessment & Plan Note (Signed)
Continue glyburide and Lantus.  See notes on labs.

## 2022-05-08 NOTE — Assessment & Plan Note (Signed)
Discussed avoidance and not using extra Benadryl.  Lesions should resolve.

## 2022-05-08 NOTE — Assessment & Plan Note (Signed)
Still not at baseline.  See notes on labs.

## 2022-05-08 NOTE — Assessment & Plan Note (Signed)
Discussed options.  Given that he is not having escalating symptoms I think it makes sense for him to monitor his symptoms, use nitroglycerin as needed, and continue his baseline medications otherwise.  He is not enthused about aggressive interventions otherwise.  He has seen cardiology in the meantime.  I advised him to take routine cautions like not working outside in the heat of the day.

## 2022-05-08 NOTE — Assessment & Plan Note (Signed)
Likely the issue.  No reason to suspect a fracture given that he is ambulatory in the meantime.  Ice intermittently.  Fall cautions discussed with patient.  Update me as needed.

## 2022-05-22 ENCOUNTER — Other Ambulatory Visit: Payer: Self-pay | Admitting: Family Medicine

## 2022-05-24 ENCOUNTER — Telehealth: Payer: Self-pay

## 2022-05-24 NOTE — Chronic Care Management (AMB) (Unsigned)
Chronic Care Management Pharmacy Assistant   Name: Paul Terrell  MRN: [id number] DOB: [birth year]   Reason for Encounter: Medication Adherence and Delivery Coordination    Recent office visits:  05/06/22-Paul Duncan,MD(PCP)-f/u skin lesion,Labs (Kidney function is stable.  Sugar is elevated and A1c is higher at 8.5 ,Blood counts are fine.  Iron level is normal. Referral for dermatology,f/u 4 months   Recent consult visits:  None since last CCM contact   Hospital visits:  None in previous 6 months  Medications: Outpatient Encounter Medications as of 05/24/2022  Medication Sig   acetaminophen (TYLENOL) 500 MG tablet Take 500 mg by mouth every 6 (six) hours as needed (for pain or headaches).   amLODipine (NORVASC) 2.5 MG tablet Take 1 tablet (2.5 mg total) by mouth at bedtime.   aspirin 81 MG tablet Take 81 mg by mouth at bedtime.   bismuth subsalicylate (PEPTO BISMOL) 262 MG/15ML suspension Take 30 mLs by mouth every 6 (six) hours as needed.   blood glucose meter kit and supplies Dispense Accu-chek meter. Check sugar once daily. DX E11.9   COMFORT EZ PEN NEEDLES 31G X 5 MM MISC Use daily with insulin pen.   donepezil (ARICEPT) 5 MG tablet TAKE ONE TABLET BY MOUTH EVERYDAY AT BEDTIME   ferrous sulfate 325 (65 FE) MG tablet Take 1 tablet (325 mg total) by mouth daily with breakfast.   finasteride (PROSCAR) 5 MG tablet Take 1 tablet (5 mg total) by mouth daily.   glyBURIDE (DIABETA) 5 MG tablet TAKE TWO TABLETS BY MOUTH ONCE DAILY WITH BREAKFAST   hydrochlorothiazide (MICROZIDE) 12.5 MG capsule Take 1 capsule (12.5 mg total) by mouth daily.   Lancets (ONETOUCH ULTRASOFT) lancets USE TO check blood glucose UP TO three times daily AS DIRECTED   LANTUS SOLOSTAR 100 UNIT/ML Solostar Pen inject 46 units into THE SKIN daily   Melatonin 5 MG TABS Take 5 mg by mouth at bedtime as needed.   metoprolol succinate (TOPROL-XL) 50 MG 24 hr tablet Take 1 tablet (50 mg total) by mouth daily. Take  with or immediately following a meal.   mirtazapine (REMERON) 7.5 MG tablet Take 1 tablet (7.5 mg total) by mouth at bedtime.   Multiple Vitamin (MULTIVITAMIN) tablet Take 1 tablet by mouth daily. breakfast   nitroGLYCERIN (NITROSTAT) 0.4 MG SL tablet Dissolve 1 tab under tongue as needed for chest pain. May repeat every 5 minutes x 2 doses. If no relief call 9-1-1.   ONETOUCH VERIO test strip USE TO check blood sugar UP TO THREE TIMES DAILY   pantoprazole (PROTONIX) 40 MG tablet Take 1 tablet (40 mg total) by mouth daily.   pravastatin (PRAVACHOL) 40 MG tablet Take 1 tablet (40 mg total) by mouth at bedtime.   tamsulosin (FLOMAX) 0.4 MG CAPS capsule Take 1 capsule (0.4 mg total) by mouth at bedtime.   No facility-administered encounter medications on file as of 05/24/2022.   BP Readings from Last 3 Encounters:  05/06/22 130/78  12/28/21 (!) 160/60  12/27/21 (!) 142/64    Lab Results  Component Value Date   HGBA1C 8.5 (H) 05/06/2022      Recent OV, Consult or Hospital visit:  05/06/22-PCP No medication changes indicated   Last adherence delivery date:03/07/22      Patient is due for next adherence delivery on: 06/05/22  {Med Review:25223}  This delivery to include: Adherence Packaging  90 Days  Packs: OTC aspirin 81 mg - 1 tablet daily at bedtime OTC ferrous sulfate  325 mg - 1 tablet  breakfast OTC Mens Multivitamin 50 + - 1 tablet breakfast Glyburide 5 mg - 2 tablets daily with breakfast Pravastatin 29m 1 tablet at bedtime Tamsulosin 0.414m 1 tablet at bedtime Finasteride 53m21m1 tablet at bedtime Mirtazapine 7.53mg51mtablet at bedtime Metoprolol succinate ER 50 mg - 1 tablet at breakfast HCTZ 12.53mg 153m tablet at breakfast Pantoprazole 40mg 253mke 1 tablet at Breakfast Amlodipine 2.53mg-ta22m1 tablet bedtime  VIAL medications:   Test Strips             Donepezil 10mg- t153m1 tablet at bedtime  Nitroglycerin 0.4mg- use46m needed  Pen needles-use as directed   Patient  declined the following medications this month:  One touch lancets- not due yet   Any concerns about your medications? {yes/no:20286}  How often do you forget or accidentally miss a dose? Rarely  Do you use a pillbox? {yes/no:20286}  Is patient in packaging {yes/no:20286}  If yes  What is the date on your next pill pack?  Any concerns or issues with your packaging?   Refills requested from providers include: Lantus, nitroglycerin,pen needles   {Delivery date:25786}  Recent blood pressure readings are as follows:***   Recent blood glucose readings are as follows:*** Fasting:  Before Meals:  After Meals:  Bedtime:   Annual wellness visit in last year? Yes Most Recent BP reading:   Cycle dispensing form sent to {Medreview:27653} for review  Paul Terrell   Paul Terrell

## 2022-07-15 DIAGNOSIS — D225 Melanocytic nevi of trunk: Secondary | ICD-10-CM | POA: Diagnosis not present

## 2022-07-15 DIAGNOSIS — D492 Neoplasm of unspecified behavior of bone, soft tissue, and skin: Secondary | ICD-10-CM | POA: Diagnosis not present

## 2022-07-15 DIAGNOSIS — L814 Other melanin hyperpigmentation: Secondary | ICD-10-CM | POA: Diagnosis not present

## 2022-07-15 DIAGNOSIS — L821 Other seborrheic keratosis: Secondary | ICD-10-CM | POA: Diagnosis not present

## 2022-07-15 DIAGNOSIS — L570 Actinic keratosis: Secondary | ICD-10-CM | POA: Diagnosis not present

## 2022-07-18 ENCOUNTER — Telehealth: Payer: Self-pay

## 2022-07-18 NOTE — Chronic Care Management (AMB) (Signed)
    Chronic Care Management Pharmacy Assistant   Name: KENNER LEWAN  MRN: [id number] DOB: [birth year]  Reason for Encounter: Reminder Call  Medications: Outpatient Encounter Medications as of 07/18/2022  Medication Sig   acetaminophen (TYLENOL) 500 MG tablet Take 500 mg by mouth every 6 (six) hours as needed (for pain or headaches).   amLODipine (NORVASC) 2.5 MG tablet Take 1 tablet (2.5 mg total) by mouth at bedtime.   aspirin 81 MG tablet Take 81 mg by mouth at bedtime.   bismuth subsalicylate (PEPTO BISMOL) 262 MG/15ML suspension Take 30 mLs by mouth every 6 (six) hours as needed.   blood glucose meter kit and supplies Dispense Accu-chek meter. Check sugar once daily. DX E11.9   COMFORT EZ PEN NEEDLES 31G X 5 MM MISC Use daily with insulin pen.   donepezil (ARICEPT) 5 MG tablet TAKE ONE TABLET BY MOUTH EVERYDAY AT BEDTIME   ferrous sulfate 325 (65 FE) MG tablet Take 1 tablet (325 mg total) by mouth daily with breakfast.   finasteride (PROSCAR) 5 MG tablet Take 1 tablet (5 mg total) by mouth daily.   glyBURIDE (DIABETA) 5 MG tablet TAKE TWO TABLETS BY MOUTH ONCE DAILY WITH BREAKFAST   hydrochlorothiazide (MICROZIDE) 12.5 MG capsule Take 1 capsule (12.5 mg total) by mouth daily.   Lancets (ONETOUCH ULTRASOFT) lancets USE TO check blood glucose UP TO three times daily AS DIRECTED   LANTUS SOLOSTAR 100 UNIT/ML Solostar Pen inject 46 units into THE SKIN daily   Melatonin 5 MG TABS Take 5 mg by mouth at bedtime as needed.   metoprolol succinate (TOPROL-XL) 50 MG 24 hr tablet Take 1 tablet (50 mg total) by mouth daily. Take with or immediately following a meal.   mirtazapine (REMERON) 7.5 MG tablet Take 1 tablet (7.5 mg total) by mouth at bedtime.   Multiple Vitamin (MULTIVITAMIN) tablet Take 1 tablet by mouth daily. breakfast   nitroGLYCERIN (NITROSTAT) 0.4 MG SL tablet Dissolve 1 tab under tongue as needed for chest pain. May repeat every 5 minutes x 2 doses. If no relief call 9-1-1.    ONETOUCH VERIO test strip USE TO check blood sugar UP TO THREE TIMES DAILY   pantoprazole (PROTONIX) 40 MG tablet Take 1 tablet (40 mg total) by mouth daily.   pravastatin (PRAVACHOL) 40 MG tablet Take 1 tablet (40 mg total) by mouth at bedtime.   tamsulosin (FLOMAX) 0.4 MG CAPS capsule Take 1 capsule (0.4 mg total) by mouth at bedtime.   No facility-administered encounter medications on file as of 07/18/2022.   [name] was contacted to remind of upcoming telephone visit with Charlene Brooke on 07/24/22 at 2:15. Patient was reminded to have any blood glucose and blood pressure readings available for review at appointment.   Message was left reminding patient of appointment.  CCM referral has been placed prior to visit?  No   Star Rating Drugs: Medication:  Last Fill: Day Supply Glyburide $RemoveBeforeDE'5mg'[id number]$   05/31/22  90 Lantus   06/26/22 26 Pravastatin $RemoveBefore'40mg'[id number]$  05/31/22  [place], CPP notified  Avel Sensor, [place]  [phone]

## 2022-07-24 ENCOUNTER — Telehealth: Payer: Self-pay | Admitting: Pharmacist

## 2022-07-24 ENCOUNTER — Telehealth: Payer: Medicare Other

## 2022-07-24 NOTE — Progress Notes (Deleted)
Chronic Care Management Pharmacy Note  07/24/2022 Name:  Paul Terrell MRN:  [id number] DOB:  04-15-1931  Summary: CCM F/U visit -Pt c/o GI upset - switches between diarrhea and constipation. He eats a lot of sugar-free candy on a daily basis. He takes Pepto Bismol for diarrhea and it converts to constipation. -BP improved after addition of amlodipine 2.5 mg (Avg 140s/70s)  Recommendations/Changes made from today's visit: -Try Probitic and Fiber supplement (Metamucil or similar)  Plan: -[place] will call patient 1 month for GI update -Pharmacist follow up televisit scheduled for 6 months -PCP f/u 09/06/22   Subjective: Paul Terrell is an 88 y.o. year old male who is a primary patient of [name], [name], MD.  The CCM team was consulted for assistance with disease management and care coordination needs.    Engaged with patient by telephone for follow up visit in response to provider referral for pharmacy case management and/or care coordination services.   Consent to Services:  The patient was given information about Chronic Care Management services, agreed to services, and gave verbal consent prior to initiation of services.  Please see initial visit note for detailed documentation.   Patient Care Team: Tonia Ghent, MD as PCP - General (Family Medicine) Croitoru, Dani Gobble, MD as PCP - Cardiology (Cardiology) Jacolyn Reedy, MD as Consulting Physician (Cardiology) Luberta Mutter, MD as Consulting Physician (Ophthalmology) Druscilla Brownie, MD as Consulting Physician (Dermatology) Charlton Haws, [facility] as Pharmacist (Pharmacist)  Recent office visits: 05/06/22 Dr [name] OV: memory f/u; discussed fixing hearing aids; A1c 8.5% reasonable given age. No changes.  12/27/21 Dr [name] OV: memory change - increase Donepezil to 10 mg HS  11/15/21 Dr [name] OV: AWV - Start donepezil 5 mg HS. Sent NTG. Labs stable. No other med changes.  Recent consult visits: 12/28/21  Dr Sallyanne Kuster (Cardiology): f/u CAD, exertional chest pain. start amlodipine 2.5 mg daily HS; increase pravastatin to 40 mg.  Hospital visits: None in previous 6 months   Objective:  Lab Results  Component Value Date   CREATININE 1.12 05/06/2022   BUN 18 05/06/2022   GFR 58.42 (L) 05/06/2022   GFRNONAA 58 (L) 08/25/2021   GFRAA >60 01/17/2020   NA 134 (L) 05/06/2022   K 3.6 05/06/2022   CALCIUM 8.9 05/06/2022   CO2 27 05/06/2022   GLUCOSE 274 (H) 05/06/2022    Lab Results  Component Value Date/Time   HGBA1C 8.5 (H) 05/06/2022 09:03 AM   HGBA1C 8.0 (H) 11/15/2021 08:49 AM   GFR 58.42 (L) 05/06/2022 09:03 AM   GFR 53.95 (L) 11/15/2021 08:49 AM   MICROALBUR 6.4 (H) 11/06/2017 08:50 AM   MICROALBUR 6.2 (H) 08/05/2017 11:40 AM    Last diabetic Eye exam:  Lab Results  Component Value Date/Time   HMDIABEYEEXA No Retinopathy 08/21/2021 12:00 AM    Last diabetic Foot exam:  Lab Results  Component Value Date/Time   HMDIABFOOTEX Yes 08/22/2010 12:00 AM     Lab Results  Component Value Date   CHOL 155 11/15/2021   HDL 35.30 (L) 11/15/2021   LDLCALC 81 11/03/2020   LDLDIRECT 87.0 11/15/2021   TRIG 207.0 (H) 11/15/2021   CHOLHDL 4 11/15/2021       Latest Ref Rng & Units 11/15/2021    8:49 AM 08/25/2021    4:26 PM 11/03/2020    8:30 AM  Hepatic Function  Total Protein 6.0 - 8.3 g/dL 6.6  6.8  7.0   Albumin 3.5 - 5.2  g/dL 4.2  3.9  4.2   AST 0 - 37 U/L 31  31  34   ALT 0 - 53 U/L 38  36  39   Alk Phosphatase 39 - 117 U/L 81  77  72   Total Bilirubin 0.2 - 1.2 mg/dL 1.2  0.9  1.0     Lab Results  Component Value Date/Time   TSH 1.94 03/09/2021 10:30 AM   TSH 0.966 08/15/2019 08:44 PM   TSH 1.81 01/07/2011 08:35 AM       Latest Ref Rng & Units 05/06/2022    9:03 AM 11/15/2021    8:49 AM 08/25/2021    4:26 PM  CBC  WBC 4.0 - 10.5 K/uL 8.1  9.6  8.0   Hemoglobin 13.0 - 17.0 g/dL 15.1  15.1  15.8   Hematocrit 39.0 - 52.0 % 43.3  43.2  45.9   Platelets 150.0 -  400.0 K/uL 160.0  150.0  155     Lab Results  Component Value Date/Time   VD25OH 53.31 11/15/2021 08:49 AM   VD25OH 63.60 06/01/2020 01:43 PM    Clinical ASCVD: Yes  The ASCVD Risk score (Arnett DK, et al., 2019) failed to calculate for the following reasons:   The 2019 ASCVD risk score is only valid for ages 105 to 33       06/15/2021    8:43 AM 12/21/2019   12:17 PM 10/07/2019   11:43 AM  Depression screen PHQ 2/9  Decreased Interest 0 0 0  Down, Depressed, Hopeless 0 0 1  PHQ - 2 Score 0 0 1  Difficult doing work/chores   Not difficult at all     Social History   Tobacco Use  Smoking Status Former   Packs/day: 1.00   Years: 25.00   Total pack years: 25.00   Types: Cigarettes   Quit date: 01/09/1976   Years since quitting: 46.5  Smokeless Tobacco Former   Types: Chew  Tobacco Comments   quit smoking about 25 years ago   BP Readings from Last 3 Encounters:  05/06/22 130/78  12/28/21 (!) 160/60  12/27/21 (!) 142/64   Pulse Readings from Last 3 Encounters:  05/06/22 75  12/28/21 (!) 50  12/27/21 64   Wt Readings from Last 3 Encounters:  05/06/22 181 lb (82.1 kg)  12/28/21 184 lb 12.8 oz (83.8 kg)  12/27/21 183 lb (83 kg)   BMI Readings from Last 3 Encounters:  05/06/22 27.52 kg/m  12/28/21 28.10 kg/m  12/27/21 27.83 kg/m    Assessment/Interventions: Review of patient past medical history, allergies, medications, health status, including review of consultants reports, laboratory and other test data, was performed as part of comprehensive evaluation and provision of chronic care management services.   SDOH:  (Social Determinants of Health) assessments and interventions performed: Yes SDOH Interventions    Flowsheet Row Chronic Care Management from 01/21/2022 in [place] [facility] [place] Management from 12/04/2020 in [place] at [place] from 08/05/2017 in [place] at [place]  Interventions     Food Insecurity Interventions Intervention Not Indicated -- --  Depression Interventions/Treatment  -- -- --  Merilyn Baba to PCP]  Financial Strain Interventions Intervention Not Indicated Intervention Not Indicated  [Medications affordable] --      SDOH Screenings   Food Insecurity: No Food Insecurity (01/23/2022)  Depression (PHQ2-9): Low Risk  (06/15/2021)  Financial Resource Strain: Low Risk  (01/23/2022)  Tobacco Use: Medium Risk (05/06/2022)  CCM Care Plan  Allergies  Allergen Reactions   Nsaids Other (See Comments)    H/o anemia   Citrus Hives   Donepezil     Irritable with 30m dose but can tolerate 578mper day   Lisinopril     Presumed cause of cough   Metformin And Related     GI upset.     Zocor [Simvastatin] Other (See Comments)    Myalgia     Medications Reviewed Today     Reviewed by FoCharlton HawsRPFlor[facility]) on 01/23/22 at 16June Lakeist Status: <None>   Medication Order Taking? Sig Documenting Provider Last Dose Status Informant  acetaminophen (TYLENOL) 500 MG tablet [id number] Take 500 mg by mouth every 6 (six) hours as needed (for pain or headaches). [provider] Taking Active Family Member  amLODipine (NORVASC) 2.5 MG tablet [id number] Take 1 tablet (2.5 mg total) by mouth at bedtime. Croitoru, Mihai, MD Taking Active   aspirin 81 MG tablet [id number] Take 81 mg by mouth at bedtime. [provider] Taking Active Family Member  bismuth subsalicylate (PEPTO BISMOL) 262 MG/15ML suspension [id number] Take 30 mLs by mouth every 6 (six) hours as needed. [provider] Taking Active   blood glucose meter kit and supplies [id number] Dispense Accu-chek meter. Check sugar once daily. DX E11.9 DuTonia GhentMD Taking Active Family Member  donepezil (ARICEPT) 10 MG tablet [id number] TAKE ONE TABLET BY MOUTH EVERYDAY AT BEDTIME DuTonia GhentMD Taking Active   ferrous sulfate 325 (65 FE)  MG tablet [id number] Take 1 tablet (325 mg total) by mouth daily with breakfast. DuTonia GhentMD Taking Active Family Member  finasteride (PROSCAR) 5 MG tablet [id number] Take 1 tablet (5 mg total) by mouth daily. DuTonia GhentMD Taking Active   glyBURIDE (DIABETA) 5 MG tablet [id number] TAKE TWO TABLETS BY MOUTH ONCE DAILY WITH BREAKFAST DuTonia GhentMD Taking Active   hydrochlorothiazide (MICROZIDE) 12.5 MG capsule [id number] Take 1 capsule (12.5 mg total) by mouth daily. DuTonia GhentMD Taking Active   Insulin Pen Needle (PEN NEEDLES) 31G X 5 MM MISC [id number] Use daily with insulin pen. DuTonia GhentMD Taking Active   Lancets ([facility] CenterLTRASOFT) lancets [id number] USE TO test UP TO three times daily DuTonia GhentMD Taking Active   LANTUS SOLOSTAR 100 UNIT/ML Solostar Pen [id number] INJECT 4682NITS SUBCUTANEOUSLY DAILY DuTonia GhentMD Taking Active   Melatonin 5 MG TABS [id number] Take 5 mg by mouth at bedtime as needed. [provider] Taking Active Family Member  metoprolol succinate (TOPROL-XL) 50 MG 24 hr tablet [id number] Take 1 tablet (50 mg total) by mouth daily. Take with or immediately following a meal. DuTonia GhentMD Taking Active   mirtazapine (REMERON) 7.5 MG tablet [id number] Take 1 tablet (7.5 mg total) by mouth at bedtime. DuTonia GhentMD Taking Active   Multiple Vitamin (MULTIVITAMIN) tablet [id number] Take 1 tablet by mouth daily. breakfast [provider] Taking Active Family Member  nitroGLYCERIN (NITROSTAT) 0.4 MG SL tablet [id number] Place 1 tablet (0.4 mg total) under the tongue every 5 (five) minutes as needed for chest pain (max 3 doses in 15 minutes.). DuTonia GhentMD Taking Active   ON[facility] CenterERIO test strip [id number] USE TO check blood sugar UP TO THREE TIMES DAILY DuTonia GhentMD Taking  Active   pantoprazole (PROTONIX) 40 MG tablet [id number] Yes Take 1 tablet  (40 mg total) by mouth daily. Tonia Ghent, MD Taking Active   pravastatin (PRAVACHOL) 40 MG tablet [id number] Yes Take 1 tablet (40 mg total) by mouth at bedtime. Croitoru, Mihai, MD Taking Active   tamsulosin (FLOMAX) 0.4 MG CAPS capsule [id number] Yes Take 1 capsule (0.4 mg total) by mouth at bedtime. Tonia Ghent, MD Taking Active             Patient Active Problem List   Diagnosis Date Noted   Skin lesion 05/08/2022   Trochanteric bursitis 05/08/2022   Yellow jacket sting 05/08/2022   Hearing loss 02/19/2021   Risk for falls 11/22/2020   Memory change 11/22/2020   Compression fracture of L1 lumbar vertebra ([place]) 05/08/2020   Lower back pain 04/23/2020   Aortic aneurysm ([place]) 10/20/2019   GERD (gastroesophageal reflux disease)    Thrombocytopenia ([place])    Anemia 05/14/2018   Hypertension 05/14/2018   Unsteadiness on feet 11/05/2017   Spinal stenosis of lumbar region 11/08/2015   Advance care planning 11/04/2014   Leg pain 01/30/2014   Medicare annual wellness visit, subsequent 11/01/2013   BPH (benign prostatic hyperplasia)    Diabetes mellitus, type II ([place])    Hyperlipidemia    Hypertensive heart disease    CAD (coronary artery disease)     Immunization History  Administered Date(s) Administered   Fluad Quad(high Dose 65+) 05/28/2019, 06/15/2021   Influenza Whole 07/02/2006, 07/31/2007, 06/28/2008, 06/22/2009   Influenza,inj,Quad PF,6+ Mos 06/09/2015, 07/04/2016, 05/28/2018   Influenza-Unspecified 07/19/2014, 07/07/2017, 07/22/2019, 06/23/2020   PFIZER Comirnaty(Gray Top)Covid-19 Tri-Sucrose Vaccine 05/01/2021   PFIZER(Purple Top)SARS-COV-2 Vaccination 10/25/2019, 11/21/2019, 07/25/2020   Pneumococcal Conjugate-13 06/09/2015   Pneumococcal Polysaccharide-23 08/13/1999   Td 08/02/2004   Tdap 11/22/2014    Conditions to be addressed/monitored:  Hypertension, Hyperlipidemia, Diabetes, and Coronary Artery Disease, Dementia, Insomnia  There are no care  plans that you recently modified to display for this patient.     Medication Assistance: None required.  Patient affirms current coverage meets needs.  Compliance/Adherence/Medication fill history: Care Gaps: None  Star-Rating Drugs: Pravastatin - PDC 99% Glyburide - PDC 99%  Medication Access: Within the past 30 days, how often has patient missed a dose of medication? *** Is a pillbox or other method used to improve adherence? {YES/NO:21197} Factors that may affect medication adherence? {CHL DESC; BARRIERS:21522} Are meds synced by current pharmacy? {YES/NO:21197} Are meds delivered by current pharmacy? {YES/NO:21197} Does patient experience delays in picking up medications due to transportation concerns? {YES/NO:21197}  [facility] Reviewed: Is patient disadvantaged to use [facility]?: {YES/NO:21197} Current Rx insurance plan: *** Name and location of Current pharmacy:  [facility] - [place], [place] - [address] [place] [address] Phone: [phone] Fax: [phone]  [facility] services reviewed with patient today?: {YES/NO:21197} 90-day Packs (delivered 06/05/22) OTC aspirin 81 mg - 1 tablet daily at bedtime OTC ferrous sulfate 325 mg - 1 tablet  breakfast OTC Mens Multivitamin 50 + - 1 tablet breakfast Glyburide 5 mg - 2 tablets daily with breakfast Pravastatin 712m 1 tablet at bedtime Tamsulosin 0.429m 1 tablet at bedtime Finasteride 12m53m1 tablet at bedtime Mirtazapine 7.12mg32mtablet at bedtime Metoprolol succinate ER 50 mg - 1 tablet at breakfast HCTZ 12.12mg 33m tablet at breakfast Pantoprazole 40mg 40mke 1 tablet at Breakfast Amlodipine 2.12mg-ta47m1 tablet bedtime  VIAL medications:  Test Strips             Donepezil 30m- take 1 tablet at bedtime             Nitroglycerin 0.427m use as needed             Pen needles-use as directed Care Plan and Follow Up Patient  Decision:  Patient agrees to Care Plan and Follow-up.  Plan: Telephone follow up appointment with care management team member scheduled for:  6 months  LiCharlene BrookePharmD, BCACP Clinical Pharmacist [phone]  Current Barriers:  Diarrhea and medication-induced constipation  Pharmacist Clinical Goal(s):  Patient will contact provider office for questions/concerns as evidenced notation of same in electronic health record through collaboration with PharmD and provider.   Interventions: 1:1 collaboration with DuTonia GhentMD regarding development and update of comprehensive plan of care as evidenced by provider attestation and co-signature Inter-disciplinary care team collaboration (see longitudinal plan of care) Comprehensive medication review performed; medication list updated in electronic medical record  Hypertension (BP goal < 150/90) -Controlled - BP at goal based on home readings, improved since recent addition of amlodipine; BP goal <150/90 due to comorbidity/fall risk -Current home readings: 140/68 59; 138/84 56; 140/77 65; 152/78 68; 146/81 82 -Denies any recent falls or dizziness. Reports history of falls. Walks with a cane. Does not drive. -Current treatment: HCTZ 12.5 mg daily AM - Appropriate, Effective, Safe, Accessible Metoprolol succinate 50 mg daily AM -Appropriate, Effective, Safe, Accessible Amlodipine 2.5 mg daily -Appropriate, Effective, Safe, Accessible -Medications previously tried: Lisinopril (cough) -Current dietary habits: sitter cooks breakfast and lunch, son brings dinner; denies any sugary drinks; snacks on cookies, friends bring pies and treats by often  -Current exercise habits: minimal -Educated on Symptoms of hypotension and importance of maintaining adequate hydration; -Recommended to continue current medication  Hyperlipidemia: (LDL goal < 70, CAD) -Query controlled- LDL 87 (10/2021), pravastatin  was increased after this result, lipids have not been re-checked yet; pt is compliant with 40 mg/day and denies side effects -Hx CAD - stent 2001; follows with cardiology (Dr CrSallyanne Kuster-Current treatment: Pravastatin 40 mg daily HS - Appropriate, Query Effective Aspirin 81 mg daily HS - Appropriate, Effective, Safe, Accessible NTG 0.4 mg SL prn -Appropriate, Effective, Safe, Accessible -Medications previously tried: simvastatin (myalgias) -Prefer to avoid additional treament considering age/limited benefit -Recommended to continue current medication; repeat lipid panel at next PCP visit  Diabetes (A1c goal <8%) -Not ideally Controlled - A1c 8.5% (04/2022) at goal; pt denies hypoglycemia; per PCP A1c is reasonable, want to avoid hypoglycemia -Current home glucose readings - checks every morning fasting glucose: 175, 94, 92, 318, 102, 119 -Current medications: Lantus Solostar 46 units daily HS - Appropriate, Effective, Safe, Accessible Glyburide 5 mg - 2 tablets daily AM - Appropriate, Effective, Safe, Accessible -Medications previously tried: metformin (GI upset) -Educated on Prevention and management of hypoglycemic episodes; -Counseled to check feet daily and get yearly eye exams -Recommended to continue current medication  Insomnia (Goal: Improve sleep) -Controlled - son ChGerald [employer] pt has been sleeping relatively well lately; BMI 27, weight is stable. ChGerald [employer] good appetite -Denies concerns today -Current treatment: Mirtazapine 7.5 mg daily HS - Appropriate, Effective, Safe, Accessible Melatonin 5 mg daily HS PRN - Appropriate, Effective, Safe, Accessible -Medications previously tried/failed: none reported -Continue to monitor for risk/benefits. -Recommended to continue current medication  BPH (Goal: Control symptoms) -Controlled  -Denies concerns today -Current treatment  Finasteride  5 mg daily - Appropriate, Effective, Safe, Accessible Tamsulosin 0.4 mg daily -  Appropriate, Effective, Safe, Accessible -Medications previously tried:none reported -Recommended to continue current medication  Dementia (Goal: manage symptoms) -Controlled -Current treatment  Donepezil 10 mg daily HS - Appropriate, Effective, Safe, Accessible -Medications previously tried: n/a  -Reviewed indication for donepezil and benefits for slowing progression of impairment -Recommended to continue current medication  Constipation/Diarrhea (Goal: manage symptom) -Uncontrolled - pt reports alternating issues with diarrhea and constipation - he takes Pepto Bismol for diarrhea and ends up with constipation from that; pt reports this has been a chronic issue for many years -Pt endorses eating many sugar-free candies daily (goes through a bag every 1-2 days); discussed sugar substitutes commonly cause GI upset/diarrhea and this is very likely contributing to his issues -Current treatment  Pepto Bismol PRN - Appropriate, Effective, Query Safe -Medications previously tried: n/a -Reviewed benefits of Probitic and fiber in diet to improve regularity; advised trial of OTC probiotic and fiber supplement -Advised to limit sugar-free candies  Patient Goals/Self-Care Activities Patient will:  - take medications as prescribed as evidenced by patient report and record review focus on medication adherence by routine -Try probiotic +/- fiber supplement -Limit sugar free candy

## 2022-07-24 NOTE — Telephone Encounter (Signed)
Opened in error

## 2022-08-22 ENCOUNTER — Telehealth: Payer: Self-pay

## 2022-08-22 NOTE — Chronic Care Management (AMB) (Signed)
Chronic Care Management Pharmacy Assistant   Name: Paul Terrell  MRN: [id number] DOB: October 28, 1930  Reason for Encounter: Medication Adherence and Delivery Coordination  Recent office visits:  None since last CCM contact  Recent consult visits:  None since last CCM contact  Hospital visits:  None in previous 6 months  Medications: Outpatient Encounter Medications as of 08/22/2022  Medication Sig   acetaminophen (TYLENOL) 500 MG tablet Take 500 mg by mouth every 6 (six) hours as needed (for pain or headaches).   amLODipine (NORVASC) 2.5 MG tablet Take 1 tablet (2.5 mg total) by mouth at bedtime.   aspirin 81 MG tablet Take 81 mg by mouth at bedtime.   bismuth subsalicylate (PEPTO BISMOL) 262 MG/15ML suspension Take 30 mLs by mouth every 6 (six) hours as needed.   blood glucose meter kit and supplies Dispense Accu-chek meter. Check sugar once daily. DX E11.9   COMFORT EZ PEN NEEDLES 31G X 5 MM MISC Use daily with insulin pen.   donepezil (ARICEPT) 5 MG tablet TAKE ONE TABLET BY MOUTH EVERYDAY AT BEDTIME   ferrous sulfate 325 (65 FE) MG tablet Take 1 tablet (325 mg total) by mouth daily with breakfast.   finasteride (PROSCAR) 5 MG tablet Take 1 tablet (5 mg total) by mouth daily.   glyBURIDE (DIABETA) 5 MG tablet TAKE TWO TABLETS BY MOUTH ONCE DAILY WITH BREAKFAST   hydrochlorothiazide (MICROZIDE) 12.5 MG capsule Take 1 capsule (12.5 mg total) by mouth daily.   Lancets (ONETOUCH ULTRASOFT) lancets USE TO check blood glucose UP TO three times daily AS DIRECTED   LANTUS SOLOSTAR 100 UNIT/ML Solostar Pen inject 46 units into THE SKIN daily   Melatonin 5 MG TABS Take 5 mg by mouth at bedtime as needed.   metoprolol succinate (TOPROL-XL) 50 MG 24 hr tablet Take 1 tablet (50 mg total) by mouth daily. Take with or immediately following a meal.   mirtazapine (REMERON) 7.5 MG tablet Take 1 tablet (7.5 mg total) by mouth at bedtime.   Multiple Vitamin (MULTIVITAMIN) tablet Take 1 tablet by  mouth daily. breakfast   nitroGLYCERIN (NITROSTAT) 0.4 MG SL tablet Dissolve 1 tab under tongue as needed for chest pain. May repeat every 5 minutes x 2 doses. If no relief call 9-1-1.   ONETOUCH VERIO test strip USE TO check blood sugar UP TO THREE TIMES DAILY   pantoprazole (PROTONIX) 40 MG tablet Take 1 tablet (40 mg total) by mouth daily.   pravastatin (PRAVACHOL) 40 MG tablet Take 1 tablet (40 mg total) by mouth at bedtime.   tamsulosin (FLOMAX) 0.4 MG CAPS capsule Take 1 capsule (0.4 mg total) by mouth at bedtime.   No facility-administered encounter medications on file as of 08/22/2022.    BP Readings from Last 3 Encounters:  05/06/22 130/78  12/28/21 (!) 160/60  12/27/21 (!) 142/64    Pulse Readings from Last 3 Encounters:  05/06/22 75  12/28/21 (!) 50  12/27/21 64    Lab Results  Component Value Date/Time   HGBA1C 8.5 (H) 05/06/2022 09:03 AM   HGBA1C 8.0 (H) 11/15/2021 08:49 AM   Lab Results  Component Value Date   CREATININE 1.12 05/06/2022   BUN 18 05/06/2022   GFR 58.42 (L) 05/06/2022   GFRNONAA 58 (L) 08/25/2021   GFRAA >60 01/17/2020   NA 134 (L) 05/06/2022   K 3.6 05/06/2022   CALCIUM 8.9 05/06/2022   CO2 27 05/06/2022     Last adherence delivery date:06/05/22  Patient is due for next adherence delivery on: 09/03/22  Spoke with patient on 08/22/22 reviewed medications and coordinated delivery. Spoke with son Paul Terrell- Patient  just got updated covid shot and had some bad days after but patient is improving and feeling better.  This delivery to include: Adherence Packaging  90 Days  OTC aspirin 81 mg - 1 tablet daily at bedtime OTC ferrous sulfate 325 mg - 1 tablet  breakfast OTC Mens Multivitamin 50 + - 1 tablet breakfast Glyburide 5 mg - 2 tablets daily with breakfast Pravastatin 54m 1 tablet at bedtime Tamsulosin 0.481m 1 tablet at bedtime Finasteride 16m8m1 tablet at bedtime Mirtazapine 7.16mg69mtablet at bedtime Metoprolol succinate ER 50 mg -  1 tablet at breakfast HCTZ 12.16mg 59m tablet at breakfast Pantoprazole 40mg 71mke 1 tablet at Breakfast Amlodipine 2.16mg-ta68m1 tablet bedtime   Probiotic- take 1 tablet breakfast  Preservision areds 250mg- t23m1 tablet breakfast 1 tablet bedtime   Patient declined the following medications this month:  Nitroglycerin 0.4mg- sup46m on hand   One touch lancets  Any concerns about your medications? No  How often do you forget or accidentally miss a dose? Never  Do you use a pillbox? No  Is patient in packaging Yes  If yes  What is the date on your next pill pack? Not home to read pack date   Any concerns or issues with your packaging? Satisfied    Refills requested from providers include: donepezil  Confirmed delivery date of 09/03/22, advised patient that pharmacy will contact them the morning of delivery.  Recent blood pressure readings are as follows:none available   Recent blood glucose readings are as follows: none available   Annual wellness visit in last year? Yes Most Recent BP reading:130/78   [facility]. Next CCM appointment: 01/28/2023 Lindsey FCharlene Brookeified  Lorine Iannaccone HAvel [place]oral[place]-[phone]

## 2022-08-28 ENCOUNTER — Other Ambulatory Visit: Payer: Self-pay | Admitting: Family Medicine

## 2022-09-06 ENCOUNTER — Ambulatory Visit (INDEPENDENT_AMBULATORY_CARE_PROVIDER_SITE_OTHER): Payer: Medicare Other | Admitting: Family Medicine

## 2022-09-06 ENCOUNTER — Encounter: Payer: Self-pay | Admitting: Family Medicine

## 2022-09-06 VITALS — BP 118/60 | HR 71 | Temp 98.2°F | Ht 68.0 in | Wt 180.0 lb

## 2022-09-06 DIAGNOSIS — E119 Type 2 diabetes mellitus without complications: Secondary | ICD-10-CM

## 2022-09-06 DIAGNOSIS — Z862 Personal history of diseases of the blood and blood-forming organs and certain disorders involving the immune mechanism: Secondary | ICD-10-CM

## 2022-09-06 DIAGNOSIS — I10 Essential (primary) hypertension: Secondary | ICD-10-CM | POA: Diagnosis not present

## 2022-09-06 DIAGNOSIS — R413 Other amnesia: Secondary | ICD-10-CM

## 2022-09-06 LAB — CBC WITH DIFFERENTIAL/PLATELET
Basophils Absolute: 0.1 10*3/uL (ref 0.0–0.1)
Basophils Relative: 1.4 % (ref 0.0–3.0)
Eosinophils Absolute: 0.3 10*3/uL (ref 0.0–0.7)
Eosinophils Relative: 4.4 % (ref 0.0–5.0)
HCT: 43.1 % (ref 39.0–52.0)
Hemoglobin: 15.2 g/dL (ref 13.0–17.0)
Lymphocytes Relative: 30.7 % (ref 12.0–46.0)
Lymphs Abs: 2.1 10*3/uL (ref 0.7–4.0)
MCHC: 35.2 g/dL (ref 30.0–36.0)
MCV: 96.6 fl (ref 78.0–100.0)
Monocytes Absolute: 0.6 10*3/uL (ref 0.1–1.0)
Monocytes Relative: 9.2 % (ref 3.0–12.0)
Neutro Abs: 3.7 10*3/uL (ref 1.4–7.7)
Neutrophils Relative %: 54.3 % (ref 43.0–77.0)
Platelets: 179 10*3/uL (ref 150.0–400.0)
RBC: 4.46 Mil/uL (ref 4.22–5.81)
RDW: 14 % (ref 11.5–15.5)
WBC: 6.8 10*3/uL (ref 4.0–10.5)

## 2022-09-06 LAB — HEMOGLOBIN A1C: Hgb A1c MFr Bld: 8 % — ABNORMAL HIGH (ref 4.6–6.5)

## 2022-09-06 LAB — BASIC METABOLIC PANEL
BUN: 17 mg/dL (ref 6–23)
CO2: 30 mEq/L (ref 19–32)
Calcium: 9.1 mg/dL (ref 8.4–10.5)
Chloride: 98 mEq/L (ref 96–112)
Creatinine, Ser: 1.13 mg/dL (ref 0.40–1.50)
GFR: 57.66 mL/min — ABNORMAL LOW (ref >60.00)
Glucose, Bld: 254 mg/dL — ABNORMAL HIGH (ref 70–99)
Potassium: 4 mEq/L (ref 3.5–5.1)
Sodium: 136 mEq/L (ref 135–145)

## 2022-09-06 LAB — IRON: Iron: 98 ug/dL (ref 42–165)

## 2022-09-06 MED ORDER — AMLODIPINE BESYLATE 2.5 MG PO TABS: ORAL_TABLET | ORAL | 3 refills | Status: AC

## 2022-09-06 NOTE — Patient Instructions (Addendum)
Hold amlodipine for now.  See if you feel better/less lightheaded.   Go to the lab on the way out.   If you have mychart we'll likely use that to update you.    Take care.  Glad to see you. See if you can get a shoe with a wider toe box to have more room and prevent rubbing.

## 2022-09-06 NOTE — Progress Notes (Unsigned)
He had dermatology f/u.    History of hypertension, on amlodipine metoprolol hydrochlorothiazide.  He is occ getting lightheaded.    H/o memory change on donepezil.  Compliant.  Here today with his son.  Wife has memory changes.  Discussed.  Diabetes:  Using medications without difficulties: yes Hypoglycemic episodes: no Hyperglycemic episodes:no Feet problems: See below, discussed footcare, discussed getting a shoe with a wider toe box to prevent rubbing on the toes. Blood Sugars averaging: low to mid 100s.    HOH, has hearing aids in place.  Had eval at [facility] with hearing aids at max volume.    Meds, vitals, and allergies reviewed.   ROS: Per HPI unless specifically indicated in ROS section   GEN: nad, alert and oriented, hard of hearing. HEENT: ncat NECK: supple w/o LA CV: rrr. PULM: ctab, no inc wob ABD: soft, +bs EXT: no edema SKIN: well perfused.   Diabetic foot exam: Normal inspection except for B 2nd toe irritation No skin breakdown No calluses  Dec but intact DP pulses dec sensation to light touch and monofilament Nails normal

## 2022-09-08 NOTE — Assessment & Plan Note (Signed)
Discussed options.  Occasionally lightheaded. Hold amlodipine for now.  See after visit summary.

## 2022-09-08 NOTE — Assessment & Plan Note (Signed)
Continue glyburide, Lantus.  Recheck periodically.  Discussed footcare.  Recheck periodically.

## 2022-09-08 NOTE — Assessment & Plan Note (Signed)
Wife also with memory changes.  Son/family is supportive.  Discussed situation.  Continue donepezil.  I asked patient/son to check about getting his wife an appointment so we can see about options for her concurrently.

## 2022-09-21 ENCOUNTER — Emergency Department (HOSPITAL_BASED_OUTPATIENT_CLINIC_OR_DEPARTMENT_OTHER)
Admission: EM | Admit: 2022-09-21 | Discharge: 2022-09-21 | Disposition: A | Discharge status: Home / Self Care | Payer: Medicare Other | Source: Home / Self Care | Attending: Emergency Medicine | Admitting: Emergency Medicine

## 2022-09-21 ENCOUNTER — Other Ambulatory Visit: Payer: Self-pay

## 2022-09-21 ENCOUNTER — Encounter (HOSPITAL_BASED_OUTPATIENT_CLINIC_OR_DEPARTMENT_OTHER): Payer: Self-pay | Admitting: Emergency Medicine

## 2022-09-21 DIAGNOSIS — I10 Essential (primary) hypertension: Secondary | ICD-10-CM | POA: Insufficient documentation

## 2022-09-21 DIAGNOSIS — R000 Tachycardia, unspecified: Secondary | ICD-10-CM | POA: Diagnosis not present

## 2022-09-21 DIAGNOSIS — E119 Type 2 diabetes mellitus without complications: Secondary | ICD-10-CM | POA: Insufficient documentation

## 2022-09-21 DIAGNOSIS — J9602 Acute respiratory failure with hypercapnia: Secondary | ICD-10-CM | POA: Diagnosis not present

## 2022-09-21 DIAGNOSIS — E1165 Type 2 diabetes mellitus with hyperglycemia: Secondary | ICD-10-CM | POA: Diagnosis not present

## 2022-09-21 DIAGNOSIS — I25110 Atherosclerotic heart disease of native coronary artery with unstable angina pectoris: Secondary | ICD-10-CM | POA: Diagnosis not present

## 2022-09-21 DIAGNOSIS — R404 Transient alteration of awareness: Secondary | ICD-10-CM | POA: Diagnosis not present

## 2022-09-21 DIAGNOSIS — R4182 Altered mental status, unspecified: Secondary | ICD-10-CM | POA: Diagnosis not present

## 2022-09-21 DIAGNOSIS — R059 Cough, unspecified: Secondary | ICD-10-CM | POA: Diagnosis not present

## 2022-09-21 DIAGNOSIS — R410 Disorientation, unspecified: Secondary | ICD-10-CM | POA: Diagnosis not present

## 2022-09-21 DIAGNOSIS — E870 Hyperosmolality and hypernatremia: Secondary | ICD-10-CM | POA: Diagnosis not present

## 2022-09-21 DIAGNOSIS — J9811 Atelectasis: Secondary | ICD-10-CM | POA: Diagnosis not present

## 2022-09-21 DIAGNOSIS — I25118 Atherosclerotic heart disease of native coronary artery with other forms of angina pectoris: Secondary | ICD-10-CM | POA: Diagnosis not present

## 2022-09-21 DIAGNOSIS — R5383 Other fatigue: Secondary | ICD-10-CM | POA: Insufficient documentation

## 2022-09-21 DIAGNOSIS — Z7982 Long term (current) use of aspirin: Secondary | ICD-10-CM | POA: Insufficient documentation

## 2022-09-21 DIAGNOSIS — R6520 Severe sepsis without septic shock: Secondary | ICD-10-CM | POA: Diagnosis not present

## 2022-09-21 DIAGNOSIS — R509 Fever, unspecified: Secondary | ICD-10-CM | POA: Diagnosis not present

## 2022-09-21 DIAGNOSIS — I2510 Atherosclerotic heart disease of native coronary artery without angina pectoris: Secondary | ICD-10-CM | POA: Insufficient documentation

## 2022-09-21 DIAGNOSIS — E876 Hypokalemia: Secondary | ICD-10-CM | POA: Diagnosis not present

## 2022-09-21 DIAGNOSIS — Z1152 Encounter for screening for COVID-19: Secondary | ICD-10-CM | POA: Insufficient documentation

## 2022-09-21 DIAGNOSIS — I7121 Aneurysm of the ascending aorta, without rupture: Secondary | ICD-10-CM | POA: Diagnosis not present

## 2022-09-21 DIAGNOSIS — Z7189 Other specified counseling: Secondary | ICD-10-CM | POA: Diagnosis not present

## 2022-09-21 DIAGNOSIS — J069 Acute upper respiratory infection, unspecified: Secondary | ICD-10-CM | POA: Insufficient documentation

## 2022-09-21 DIAGNOSIS — I214 Non-ST elevation (NSTEMI) myocardial infarction: Secondary | ICD-10-CM | POA: Diagnosis not present

## 2022-09-21 DIAGNOSIS — R519 Headache, unspecified: Secondary | ICD-10-CM | POA: Diagnosis not present

## 2022-09-21 DIAGNOSIS — M6282 Rhabdomyolysis: Secondary | ICD-10-CM | POA: Diagnosis not present

## 2022-09-21 DIAGNOSIS — B974 Respiratory syncytial virus as the cause of diseases classified elsewhere: Secondary | ICD-10-CM | POA: Insufficient documentation

## 2022-09-21 DIAGNOSIS — B338 Other specified viral diseases: Secondary | ICD-10-CM | POA: Diagnosis not present

## 2022-09-21 DIAGNOSIS — Z8616 Personal history of COVID-19: Secondary | ICD-10-CM | POA: Diagnosis not present

## 2022-09-21 DIAGNOSIS — J45909 Unspecified asthma, uncomplicated: Secondary | ICD-10-CM | POA: Diagnosis not present

## 2022-09-21 DIAGNOSIS — R7989 Other specified abnormal findings of blood chemistry: Secondary | ICD-10-CM | POA: Diagnosis not present

## 2022-09-21 DIAGNOSIS — G9341 Metabolic encephalopathy: Secondary | ICD-10-CM | POA: Diagnosis not present

## 2022-09-21 DIAGNOSIS — Y92009 Unspecified place in unspecified non-institutional (private) residence as the place of occurrence of the external cause: Secondary | ICD-10-CM | POA: Diagnosis not present

## 2022-09-21 DIAGNOSIS — W19XXXA Unspecified fall, initial encounter: Secondary | ICD-10-CM | POA: Diagnosis not present

## 2022-09-21 DIAGNOSIS — J189 Pneumonia, unspecified organism: Secondary | ICD-10-CM | POA: Diagnosis not present

## 2022-09-21 DIAGNOSIS — E871 Hypo-osmolality and hyponatremia: Secondary | ICD-10-CM | POA: Diagnosis not present

## 2022-09-21 DIAGNOSIS — Z515 Encounter for palliative care: Secondary | ICD-10-CM | POA: Diagnosis not present

## 2022-09-21 DIAGNOSIS — I110 Hypertensive heart disease with heart failure: Secondary | ICD-10-CM | POA: Diagnosis not present

## 2022-09-21 DIAGNOSIS — R739 Hyperglycemia, unspecified: Secondary | ICD-10-CM | POA: Diagnosis not present

## 2022-09-21 DIAGNOSIS — I21A1 Myocardial infarction type 2: Secondary | ICD-10-CM | POA: Diagnosis not present

## 2022-09-21 DIAGNOSIS — Z66 Do not resuscitate: Secondary | ICD-10-CM | POA: Diagnosis not present

## 2022-09-21 DIAGNOSIS — F03C0 Unspecified dementia, severe, without behavioral disturbance, psychotic disturbance, mood disturbance, and anxiety: Secondary | ICD-10-CM | POA: Diagnosis not present

## 2022-09-21 DIAGNOSIS — N179 Acute kidney failure, unspecified: Secondary | ICD-10-CM | POA: Diagnosis not present

## 2022-09-21 DIAGNOSIS — E782 Mixed hyperlipidemia: Secondary | ICD-10-CM | POA: Diagnosis not present

## 2022-09-21 DIAGNOSIS — A419 Sepsis, unspecified organism: Secondary | ICD-10-CM | POA: Diagnosis not present

## 2022-09-21 DIAGNOSIS — R051 Acute cough: Secondary | ICD-10-CM

## 2022-09-21 DIAGNOSIS — R531 Weakness: Secondary | ICD-10-CM | POA: Diagnosis not present

## 2022-09-21 DIAGNOSIS — Z794 Long term (current) use of insulin: Secondary | ICD-10-CM | POA: Insufficient documentation

## 2022-09-21 DIAGNOSIS — A4189 Other specified sepsis: Secondary | ICD-10-CM | POA: Diagnosis not present

## 2022-09-21 DIAGNOSIS — N400 Enlarged prostate without lower urinary tract symptoms: Secondary | ICD-10-CM | POA: Diagnosis not present

## 2022-09-21 DIAGNOSIS — I5043 Acute on chronic combined systolic (congestive) and diastolic (congestive) heart failure: Secondary | ICD-10-CM | POA: Diagnosis not present

## 2022-09-21 DIAGNOSIS — J9601 Acute respiratory failure with hypoxia: Secondary | ICD-10-CM | POA: Diagnosis not present

## 2022-09-21 LAB — RESP PANEL BY RT-PCR (RSV, FLU A&B, COVID)  RVPGX2
Influenza A by PCR: NEGATIVE
Influenza B by PCR: NEGATIVE
Resp Syncytial Virus by PCR: POSITIVE — AB
SARS Coronavirus 2 by RT PCR: NEGATIVE

## 2022-09-21 MED ORDER — ACETAMINOPHEN 325 MG PO TABS
650.0000 mg | ORAL_TABLET | Freq: Once | ORAL | Status: AC
  Administered 2022-09-21: 650 mg via ORAL
  Filled 2022-09-21: qty 2

## 2022-09-21 NOTE — ED Provider Notes (Addendum)
[facility] EMERGENCY DEPT Provider Note   CSN: [id number] Arrival date & time: 09/21/22  1035     History  No chief complaint on file.   Paul Terrell is a 87 y.o. male.  The history is provided by the patient and medical records. No language interpreter was used.  URI Presenting symptoms: congestion, cough, fatigue, fever and rhinorrhea   Severity:  Moderate Onset quality:  Gradual Duration:  4 days Timing:  Constant Progression:  Unchanged Chronicity:  New Relieved by:  Nothing Worsened by:  Nothing Ineffective treatments:  None tried Associated symptoms: no headaches, no myalgias and no wheezing   Risk factors: sick contacts        Home Medications Prior to Admission medications   Medication Sig Start Date End Date Taking? Authorizing Provider  acetaminophen (TYLENOL) 500 MG tablet Take 500 mg by mouth every 6 (six) hours as needed (for pain or headaches).    [provider]  amLODipine (NORVASC) 2.5 MG tablet Held as of 09/06/22 09/06/22   Tonia Ghent, MD  aspirin 81 MG tablet Take 81 mg by mouth at bedtime.    [provider]  bismuth subsalicylate (PEPTO BISMOL) 262 MG/15ML suspension Take 30 mLs by mouth every 6 (six) hours as needed.    [provider]  blood glucose meter kit and supplies Dispense Accu-chek meter. Check sugar once daily. DX E11.9 10/07/19   Tonia Ghent, MD  COMFORT EZ PEN NEEDLES 31G X 5 MM MISC Use daily with insulin pen. 05/22/22   Tonia Ghent, MD  donepezil (ARICEPT) 5 MG tablet TAKE ONE TABLET BY MOUTH AT bedtime 08/28/22   Tonia Ghent, MD  ferrous sulfate 325 (65 FE) MG tablet Take 1 tablet (325 mg total) by mouth daily with breakfast. 11/19/18   Tonia Ghent, MD  finasteride (PROSCAR) 5 MG tablet Take 1 tablet (5 mg total) by mouth daily. 11/15/21   Tonia Ghent, MD  glyBURIDE (DIABETA) 5 MG tablet TAKE TWO TABLETS BY MOUTH ONCE DAILY WITH BREAKFAST 11/15/21   Tonia Ghent,  MD  hydrochlorothiazide (MICROZIDE) 12.5 MG capsule Take 1 capsule (12.5 mg total) by mouth daily. 11/15/21   Tonia Ghent, MD  Lancets [facility] ULTRASOFT) lancets USE TO check blood glucose UP TO three times daily AS DIRECTED 02/28/22   Tonia Ghent, MD  LANTUS SOLOSTAR 100 UNIT/ML Solostar Pen inject 46 units into THE SKIN daily 05/22/22   Tonia Ghent, MD  Melatonin 5 MG TABS Take 5 mg by mouth at bedtime as needed.    [provider]  metoprolol succinate (TOPROL-XL) 50 MG 24 hr tablet Take 1 tablet (50 mg total) by mouth daily. Take with or immediately following a meal. 11/15/21   Tonia Ghent, MD  mirtazapine (REMERON) 7.5 MG tablet Take 1 tablet (7.5 mg total) by mouth at bedtime. 11/15/21   Tonia Ghent, MD  Multiple Vitamin (MULTIVITAMIN) tablet Take 1 tablet by mouth daily. breakfast    [provider]  nitroGLYCERIN (NITROSTAT) 0.4 MG SL tablet Dissolve 1 tab under tongue as needed for chest pain. May repeat every 5 minutes x 2 doses. If no relief call 9-1-1. 05/22/22   Tonia Ghent, MD  [facility] VERIO test strip USE TO check blood glucose UP TO three times daily AS DIRECTED 08/28/22   Tonia Ghent, MD  pantoprazole (PROTONIX) 40 MG tablet Take 1 tablet (40 mg total) by mouth daily. 11/15/21  Tonia Ghent, MD  pravastatin (PRAVACHOL) 40 MG tablet Take 1 tablet (40 mg total) by mouth at bedtime. 12/28/21   Croitoru, Mihai, MD  tamsulosin (FLOMAX) 0.4 MG CAPS capsule Take 1 capsule (0.4 mg total) by mouth at bedtime. 11/15/21   Tonia Ghent, MD      Allergies    Nsaids, Citrus, Donepezil, Lisinopril, Metformin and related, and Zocor [simvastatin]    Review of Systems   Review of Systems  Constitutional:  Positive for chills, fatigue and fever.  HENT:  Positive for congestion and rhinorrhea.   Respiratory:  Positive for cough. Negative for chest tightness, shortness of breath and wheezing.   Cardiovascular:  Negative for chest pain and  palpitations.  Gastrointestinal:  Negative for abdominal pain, constipation, diarrhea, nausea and vomiting.  Genitourinary:  Negative for dysuria and flank pain.  Musculoskeletal:  Negative for back pain and myalgias.  Skin:  Negative for rash.  Neurological:  Negative for weakness, light-headedness, numbness and headaches.  Psychiatric/Behavioral:  Negative for agitation and confusion.   All other systems reviewed and are negative.   Physical Exam Updated Vital Signs BP 135/72 (BP Location: Right Arm)   Pulse 100   Temp 98.6 F (37 C) (Oral)   Resp (!) 24   SpO2 93%  Physical Exam Vitals and nursing note reviewed.  Constitutional:      General: He is not in acute distress.    Appearance: He is well-developed. He is not ill-appearing, toxic-appearing or diaphoretic.  HENT:     Head: Normocephalic and atraumatic.     Nose: Congestion present.     Mouth/Throat:     Mouth: Mucous membranes are moist.  Eyes:     Extraocular Movements: Extraocular movements intact.     Conjunctiva/sclera: Conjunctivae normal.     Pupils: Pupils are equal, round, and reactive to light.  Cardiovascular:     Rate and Rhythm: Normal rate and regular rhythm.     Heart sounds: No murmur heard. Pulmonary:     Effort: Pulmonary effort is normal. No respiratory distress.     Breath sounds: Normal breath sounds. No wheezing, rhonchi or rales.  Chest:     Chest wall: No tenderness.  Abdominal:     General: Abdomen is flat.     Palpations: Abdomen is soft.     Tenderness: There is no abdominal tenderness. There is no right CVA tenderness, left CVA tenderness, guarding or rebound.  Musculoskeletal:        General: No swelling or tenderness.     Cervical back: Neck supple. No tenderness.     Right lower leg: No edema.     Left lower leg: No edema.  Skin:    General: Skin is warm and dry.     Capillary Refill: Capillary refill takes less than 2 seconds.     Findings: No erythema.  Neurological:      General: No focal deficit present.     Mental Status: He is alert.  Psychiatric:        Mood and Affect: Mood normal.     ED Results / Procedures / Treatments   Labs (all labs ordered are listed, but only abnormal results are displayed) Labs Reviewed  RESP PANEL BY RT-PCR (RSV, FLU A&B, COVID)  [id number] - Abnormal; Notable for the following components:      Result Value   Resp Syncytial Virus by PCR POSITIVE (*)    All other components within normal limits    EKG  None  Radiology No results found.  Procedures Procedures    Medications Ordered in ED Medications  acetaminophen (TYLENOL) tablet 650 mg (650 mg Oral Given 09/21/22 1108)    ED Course/ Medical Decision Making/ A&P                           Medical Decision Making Risk OTC drugs.    Paul Terrell is a 88 y.o. male with a past medical history significant for hypertension, hyperlipidemia, CAD, diabetes, BPH, GERD, and hard of hearing who presents with URI symptoms with congestion, cough, chills, and fatigue.  According to family, wife has had similar symptoms.  They have had symptoms for about a week but patient had slight weight loss of about 4 days.  He has had no significant nausea, vomiting, constipation, or diarrhea.  Denies any urinary changes.  Is primarily having the cold symptoms.  On arrival, vital signs reassuring with no hypoxia tachycardia or tachypnea.  He is afebrile here.  On exam, lungs are clear and chest is nontender.  Abdomen nontender but he does have some congestion and rhinorrhea on exam.  No focal neurologic deficits.  No nuchal rigidity or stiffness.  No rashes.  Patient well-appearing.  Patient was found to be RSV positive.  I suspect is the cause of his symptoms.  Patient was monitored for several hours without hypoxia.  We had discussion about labs and imaging and given his well appearance and reassuring vitals we had a shared decision-making conversation and agreed to hold on labs and  imaging at this time.  Patient will rest and stay hydrated at home and follow-up with his PCP.  He understood extremely tricked return precautions for new or worsening symptoms including shortness of breath or chest pain neither of which she has had previously.  Patient with questions or concerns and was discharged in good condition with family.                 Final Clinical Impression(s) / ED Diagnoses Final diagnoses:  RSV (respiratory syncytial virus infection)  Upper respiratory tract infection, unspecified type  Acute cough  Fatigue, unspecified type    Rx / DC Orders ED Discharge Orders     None      Clinical Impression: 1. RSV (respiratory syncytial virus infection)   2. Upper respiratory tract infection, unspecified type   3. Acute cough   4. Fatigue, unspecified type     Disposition: Discharge  Condition: Good  I have discussed the results, Dx and Tx plan with the pt(& family if present). He/she/they expressed understanding and agree(s) with the plan. Discharge instructions discussed at great length. Strict return precautions discussed and pt &/or family have verbalized understanding of the instructions. No further questions at time of discharge.    Discharge Medication List as of 09/21/2022  2:01 PM      Follow Up: Tonia Ghent, MD [place] [address] [phone]        Tahji , Gwenyth Allegra, MD 09/21/22 1531    Samhita Kretsch, Gwenyth Allegra, MD 09/21/22 [phone]

## 2022-09-21 NOTE — ED Triage Notes (Signed)
Pt here from home with c/o fever and cough and weakness

## 2022-09-21 NOTE — Discharge Instructions (Signed)
Your history, exam, workup today revealed RSV as the cause of your upper respiratory infection symptoms.  Given the reassuring exam and vital signs I do feel you are safe for discharge home.  We had a shared decision-making conversation offering imaging and labs however given the well appearance we feel you are safe for discharge.  Please call to follow-up with his PCP in the neck several days and make sure he is resting and staying hydrated.  Use over-the-counter medications to help with fevers.  If any symptoms change or worsen acutely, please return to the nearest emergency department.

## 2022-09-22 ENCOUNTER — Inpatient Hospital Stay (HOSPITAL_COMMUNITY)
Admission: EM | Admit: 2022-09-22 | Discharge: 2022-10-24 | DRG: 871 | Disposition: E | Payer: Medicare Other | Attending: Student | Admitting: Student

## 2022-09-22 ENCOUNTER — Other Ambulatory Visit: Payer: Self-pay

## 2022-09-22 ENCOUNTER — Encounter (HOSPITAL_COMMUNITY): Payer: Self-pay | Admitting: Emergency Medicine

## 2022-09-22 ENCOUNTER — Emergency Department (HOSPITAL_COMMUNITY): Payer: Medicare Other

## 2022-09-22 DIAGNOSIS — M6282 Rhabdomyolysis: Secondary | ICD-10-CM | POA: Diagnosis present

## 2022-09-22 DIAGNOSIS — I214 Non-ST elevation (NSTEMI) myocardial infarction: Secondary | ICD-10-CM | POA: Insufficient documentation

## 2022-09-22 DIAGNOSIS — R000 Tachycardia, unspecified: Secondary | ICD-10-CM | POA: Insufficient documentation

## 2022-09-22 DIAGNOSIS — R739 Hyperglycemia, unspecified: Secondary | ICD-10-CM | POA: Diagnosis not present

## 2022-09-22 DIAGNOSIS — J9811 Atelectasis: Secondary | ICD-10-CM | POA: Diagnosis not present

## 2022-09-22 DIAGNOSIS — R296 Repeated falls: Secondary | ICD-10-CM | POA: Diagnosis present

## 2022-09-22 DIAGNOSIS — Z7982 Long term (current) use of aspirin: Secondary | ICD-10-CM

## 2022-09-22 DIAGNOSIS — W19XXXA Unspecified fall, initial encounter: Secondary | ICD-10-CM

## 2022-09-22 DIAGNOSIS — R338 Other retention of urine: Secondary | ICD-10-CM | POA: Diagnosis present

## 2022-09-22 DIAGNOSIS — Z83438 Family history of other disorder of lipoprotein metabolism and other lipidemia: Secondary | ICD-10-CM

## 2022-09-22 DIAGNOSIS — W1830XA Fall on same level, unspecified, initial encounter: Secondary | ICD-10-CM | POA: Diagnosis present

## 2022-09-22 DIAGNOSIS — Z7189 Other specified counseling: Secondary | ICD-10-CM

## 2022-09-22 DIAGNOSIS — E1165 Type 2 diabetes mellitus with hyperglycemia: Secondary | ICD-10-CM | POA: Diagnosis present

## 2022-09-22 DIAGNOSIS — E876 Hypokalemia: Secondary | ICD-10-CM | POA: Diagnosis present

## 2022-09-22 DIAGNOSIS — I10 Essential (primary) hypertension: Secondary | ICD-10-CM | POA: Diagnosis present

## 2022-09-22 DIAGNOSIS — I21A1 Myocardial infarction type 2: Secondary | ICD-10-CM | POA: Diagnosis not present

## 2022-09-22 DIAGNOSIS — B338 Other specified viral diseases: Secondary | ICD-10-CM | POA: Diagnosis present

## 2022-09-22 DIAGNOSIS — Z66 Do not resuscitate: Secondary | ICD-10-CM | POA: Diagnosis present

## 2022-09-22 DIAGNOSIS — J45909 Unspecified asthma, uncomplicated: Secondary | ICD-10-CM | POA: Diagnosis present

## 2022-09-22 DIAGNOSIS — N179 Acute kidney failure, unspecified: Secondary | ICD-10-CM | POA: Diagnosis present

## 2022-09-22 DIAGNOSIS — H9190 Unspecified hearing loss, unspecified ear: Secondary | ICD-10-CM | POA: Diagnosis present

## 2022-09-22 DIAGNOSIS — R404 Transient alteration of awareness: Secondary | ICD-10-CM | POA: Diagnosis not present

## 2022-09-22 DIAGNOSIS — R519 Headache, unspecified: Secondary | ICD-10-CM | POA: Diagnosis not present

## 2022-09-22 DIAGNOSIS — Z79899 Other long term (current) drug therapy: Secondary | ICD-10-CM

## 2022-09-22 DIAGNOSIS — Z8616 Personal history of COVID-19: Secondary | ICD-10-CM

## 2022-09-22 DIAGNOSIS — I5043 Acute on chronic combined systolic (congestive) and diastolic (congestive) heart failure: Secondary | ICD-10-CM | POA: Diagnosis present

## 2022-09-22 DIAGNOSIS — J9602 Acute respiratory failure with hypercapnia: Secondary | ICD-10-CM | POA: Diagnosis not present

## 2022-09-22 DIAGNOSIS — Z85828 Personal history of other malignant neoplasm of skin: Secondary | ICD-10-CM

## 2022-09-22 DIAGNOSIS — Z823 Family history of stroke: Secondary | ICD-10-CM

## 2022-09-22 DIAGNOSIS — J159 Unspecified bacterial pneumonia: Secondary | ICD-10-CM | POA: Diagnosis present

## 2022-09-22 DIAGNOSIS — G9341 Metabolic encephalopathy: Secondary | ICD-10-CM | POA: Diagnosis present

## 2022-09-22 DIAGNOSIS — Z1152 Encounter for screening for COVID-19: Secondary | ICD-10-CM

## 2022-09-22 DIAGNOSIS — R410 Disorientation, unspecified: Secondary | ICD-10-CM | POA: Diagnosis not present

## 2022-09-22 DIAGNOSIS — Z515 Encounter for palliative care: Secondary | ICD-10-CM

## 2022-09-22 DIAGNOSIS — Z8249 Family history of ischemic heart disease and other diseases of the circulatory system: Secondary | ICD-10-CM

## 2022-09-22 DIAGNOSIS — A4189 Other specified sepsis: Principal | ICD-10-CM | POA: Diagnosis present

## 2022-09-22 DIAGNOSIS — Z794 Long term (current) use of insulin: Secondary | ICD-10-CM

## 2022-09-22 DIAGNOSIS — I25110 Atherosclerotic heart disease of native coronary artery with unstable angina pectoris: Secondary | ICD-10-CM | POA: Diagnosis present

## 2022-09-22 DIAGNOSIS — R509 Fever, unspecified: Secondary | ICD-10-CM | POA: Diagnosis not present

## 2022-09-22 DIAGNOSIS — Z7984 Long term (current) use of oral hypoglycemic drugs: Secondary | ICD-10-CM

## 2022-09-22 DIAGNOSIS — Z781 Physical restraint status: Secondary | ICD-10-CM

## 2022-09-22 DIAGNOSIS — E785 Hyperlipidemia, unspecified: Secondary | ICD-10-CM | POA: Diagnosis present

## 2022-09-22 DIAGNOSIS — I493 Ventricular premature depolarization: Secondary | ICD-10-CM | POA: Diagnosis present

## 2022-09-22 DIAGNOSIS — N401 Enlarged prostate with lower urinary tract symptoms: Secondary | ICD-10-CM | POA: Diagnosis present

## 2022-09-22 DIAGNOSIS — F03C0 Unspecified dementia, severe, without behavioral disturbance, psychotic disturbance, mood disturbance, and anxiety: Secondary | ICD-10-CM | POA: Diagnosis present

## 2022-09-22 DIAGNOSIS — R531 Weakness: Secondary | ICD-10-CM

## 2022-09-22 DIAGNOSIS — R4182 Altered mental status, unspecified: Principal | ICD-10-CM

## 2022-09-22 DIAGNOSIS — K219 Gastro-esophageal reflux disease without esophagitis: Secondary | ICD-10-CM | POA: Diagnosis present

## 2022-09-22 DIAGNOSIS — E119 Type 2 diabetes mellitus without complications: Secondary | ICD-10-CM

## 2022-09-22 DIAGNOSIS — E871 Hypo-osmolality and hyponatremia: Secondary | ICD-10-CM | POA: Diagnosis present

## 2022-09-22 DIAGNOSIS — I110 Hypertensive heart disease with heart failure: Secondary | ICD-10-CM | POA: Diagnosis present

## 2022-09-22 DIAGNOSIS — B974 Respiratory syncytial virus as the cause of diseases classified elsewhere: Secondary | ICD-10-CM | POA: Diagnosis present

## 2022-09-22 DIAGNOSIS — A419 Sepsis, unspecified organism: Secondary | ICD-10-CM | POA: Diagnosis present

## 2022-09-22 DIAGNOSIS — E870 Hyperosmolality and hypernatremia: Secondary | ICD-10-CM | POA: Diagnosis not present

## 2022-09-22 DIAGNOSIS — I2510 Atherosclerotic heart disease of native coronary artery without angina pectoris: Secondary | ICD-10-CM | POA: Diagnosis present

## 2022-09-22 DIAGNOSIS — N400 Enlarged prostate without lower urinary tract symptoms: Secondary | ICD-10-CM | POA: Diagnosis present

## 2022-09-22 DIAGNOSIS — J9601 Acute respiratory failure with hypoxia: Secondary | ICD-10-CM | POA: Diagnosis present

## 2022-09-22 DIAGNOSIS — I7121 Aneurysm of the ascending aorta, without rupture: Secondary | ICD-10-CM | POA: Diagnosis present

## 2022-09-22 DIAGNOSIS — R6520 Severe sepsis without septic shock: Secondary | ICD-10-CM | POA: Diagnosis present

## 2022-09-22 DIAGNOSIS — Y92009 Unspecified place in unspecified non-institutional (private) residence as the place of occurrence of the external cause: Secondary | ICD-10-CM

## 2022-09-22 DIAGNOSIS — J189 Pneumonia, unspecified organism: Secondary | ICD-10-CM | POA: Insufficient documentation

## 2022-09-22 DIAGNOSIS — I700 Atherosclerosis of aorta: Secondary | ICD-10-CM | POA: Diagnosis present

## 2022-09-22 DIAGNOSIS — Z87891 Personal history of nicotine dependence: Secondary | ICD-10-CM

## 2022-09-22 LAB — CBC WITH DIFFERENTIAL/PLATELET
Abs Immature Granulocytes: 0.09 10*3/uL — ABNORMAL HIGH (ref 0.00–0.07)
Basophils Absolute: 0.1 10*3/uL (ref 0.0–0.1)
Basophils Relative: 0 %
Eosinophils Absolute: 0.1 10*3/uL (ref 0.0–0.5)
Eosinophils Relative: 1 %
HCT: 42.1 % (ref 39.0–52.0)
Hemoglobin: 15.2 g/dL (ref 13.0–17.0)
Immature Granulocytes: 1 %
Lymphocytes Relative: 6 %
Lymphs Abs: 0.8 10*3/uL (ref 0.7–4.0)
MCH: 33.7 pg (ref 26.0–34.0)
MCHC: 36.1 g/dL — ABNORMAL HIGH (ref 30.0–36.0)
MCV: 93.3 fL (ref 80.0–100.0)
Monocytes Absolute: 1.4 10*3/uL — ABNORMAL HIGH (ref 0.1–1.0)
Monocytes Relative: 11 %
Neutro Abs: 10.3 10*3/uL — ABNORMAL HIGH (ref 1.7–7.7)
Neutrophils Relative %: 81 %
Platelets: 180 10*3/uL (ref 150–400)
RBC: 4.51 MIL/uL (ref 4.22–5.81)
RDW: 13.8 % (ref 11.5–15.5)
WBC: 12.7 10*3/uL — ABNORMAL HIGH (ref 4.0–10.5)
nRBC: 0 % (ref 0.0–0.2)

## 2022-09-22 LAB — BASIC METABOLIC PANEL
Anion gap: 13 (ref 5–15)
BUN: 20 mg/dL (ref 8–23)
CO2: 27 mmol/L (ref 22–32)
Calcium: 8.6 mg/dL — ABNORMAL LOW (ref 8.9–10.3)
Chloride: 93 mmol/L — ABNORMAL LOW (ref 98–111)
Creatinine, Ser: 1.46 mg/dL — ABNORMAL HIGH (ref 0.61–1.24)
GFR, Estimated: 46 mL/min — ABNORMAL LOW (ref >60)
Glucose, Bld: 317 mg/dL — ABNORMAL HIGH (ref 70–99)
Potassium: 3.4 mmol/L — ABNORMAL LOW (ref 3.5–5.1)
Sodium: 133 mmol/L — ABNORMAL LOW (ref 135–145)

## 2022-09-22 LAB — CK: Total CK: 753 U/L — ABNORMAL HIGH (ref 49–397)

## 2022-09-22 MED ORDER — LACTATED RINGERS IV BOLUS
1000.0000 mL | Freq: Once | INTRAVENOUS | Status: AC
  Administered 2022-09-22: 1000 mL via INTRAVENOUS

## 2022-09-22 MED ORDER — ACETAMINOPHEN 500 MG PO TABS
1000.0000 mg | ORAL_TABLET | Freq: Once | ORAL | Status: AC
  Administered 2022-09-22: 1000 mg via ORAL
  Filled 2022-09-22: qty 2

## 2022-09-22 NOTE — ED Notes (Signed)
Patient placed on 4 L [place] d/t oxygen saturation at 82%.

## 2022-09-22 NOTE — ED Provider Notes (Signed)
[facility] EMERGENCY DEPARTMENT Provider Note   CSN: [id number] Arrival date & time: 09/01/2022  2056     History Chief Complaint  Patient presents with   Fall    HPI Paul Terrell is a 88 y.o. male presenting for altered mental status.  Patient is a 88 year old male extensive medical history present with chief complaint of altered mental status.  Diagnosed with RSV yesterday and has had tachypnea, shortness of breath, fevers throughout the day yesterday.  Today he was found down having fallen at home.  He lives at home alone.  He denies any symptoms at this time.  Patient is confused.  He does not know where he is just keeps saying that he has to leave and requires frequent reorientation. Patient's recorded medical, surgical, social, medication list and allergies were reviewed in the Snapshot window as part of the initial history.   Review of Systems   Review of Systems  Constitutional:  Positive for fatigue and fever. Negative for chills.  HENT:  Negative for ear pain and sore throat.   Eyes:  Negative for pain and visual disturbance.  Respiratory:  Positive for cough and shortness of breath.   Cardiovascular:  Negative for chest pain and palpitations.  Gastrointestinal:  Negative for abdominal pain and vomiting.  Genitourinary:  Negative for dysuria and hematuria.  Musculoskeletal:  Negative for arthralgias and back pain.  Skin:  Negative for color change and rash.  Neurological:  Negative for seizures and syncope.  All other systems reviewed and are negative.   Physical Exam Updated Vital Signs BP (!) 165/84   Pulse (!) 117   Temp (!) 103 F (39.4 C) (Oral)   Resp (!) 22   Ht '5\' 8"'$  (1.727 m)   Wt 82 kg   SpO2 92%   BMI 27.49 kg/m  Physical Exam Vitals and nursing note reviewed.  Constitutional:      General: He is not in acute distress.    Appearance: He is well-developed.  HENT:     Head: Normocephalic and atraumatic.  Eyes:      Conjunctiva/sclera: Conjunctivae normal.  Cardiovascular:     Rate and Rhythm: Regular rhythm. Tachycardia present.     Heart sounds: No murmur heard. Pulmonary:     Effort: Pulmonary effort is normal. No respiratory distress.     Breath sounds: Normal breath sounds.  Abdominal:     Palpations: Abdomen is soft.     Tenderness: There is no abdominal tenderness.  Musculoskeletal:        General: No swelling.     Cervical back: Neck supple.  Skin:    General: Skin is warm and dry.     Capillary Refill: Capillary refill takes less than 2 seconds.  Neurological:     Mental Status: He is alert. He is disoriented.  Psychiatric:        Mood and Affect: Mood normal.      ED Course/ Medical Decision Making/ A&P    Procedures Procedures   Medications Ordered in ED Medications  lactated ringers bolus 1,000 mL (1,000 mLs Intravenous New Bag/Given 08/28/2022 2208)  acetaminophen (TYLENOL) tablet 1,000 mg (1,000 mg Oral Given 09/06/2022 2208)    Medical Decision Making:    Paul Terrell is a 88 y.o. male who presented to the ED today with AMS detailed above.     Patient's presentation is complicated by their history of multiple comorbid medical problems.  Patient placed on continuous vitals and telemetry monitoring  while in ED which was reviewed periodically.   Complete initial physical exam performed, notably the patient  was HDS in NAD.      Reviewed and confirmed nursing documentation for past medical history, family history, social history.    Initial Assessment:   Patient's history of present illness for exam findings are most consistent with altered mental status secondary to delirium from his active RSV infection. However given his fall and being found down, rhabdomyolysis or other acute pathology is considered possible.  Broad screening laboratory evaluation initiated as well as treatment of his fever, IV fluid resuscitation.  Had a prolonged conversation with the son at bedside.   The patient has actually fallen twice in the last 24 hours and has been floridly confused not knowing where he is.  He has not eaten anything since he was seen yesterday.  The son is worried for failure to thrive secondary to his infection with RSV.  Given his tachypnea, fever, shortness of breath, patient's going to require further care and management.  Results of screening laboratory evaluation are pending at time of handoff to oncoming provider.  Burtis Junes that patient will likely require admission for further care and management unless he has significant clinical improvement in the emergency room.   Clinical Impression:  1. Altered mental status, unspecified altered mental status type      Data Unavailable   Final Clinical Impression(s) / ED Diagnoses Final diagnoses:  Altered mental status, unspecified altered mental status type    Rx / DC Orders ED Discharge Orders     None         Tretha Sciara, MD 09/08/2022 2246

## 2022-09-22 NOTE — ED Triage Notes (Signed)
Patient diagnosed with RSV yesterday.  Son reports finding him on the floor at home today, unknown downtime, patient not on blood thinners.  Patient is hard of hearing, son reports patient gets confused when he runs a fever.  Patient denies injury/pain.

## 2022-09-23 ENCOUNTER — Encounter (HOSPITAL_COMMUNITY): Payer: Self-pay | Admitting: Internal Medicine

## 2022-09-23 DIAGNOSIS — R410 Disorientation, unspecified: Secondary | ICD-10-CM | POA: Diagnosis not present

## 2022-09-23 DIAGNOSIS — Z7189 Other specified counseling: Secondary | ICD-10-CM | POA: Diagnosis not present

## 2022-09-23 DIAGNOSIS — J9602 Acute respiratory failure with hypercapnia: Secondary | ICD-10-CM | POA: Diagnosis not present

## 2022-09-23 DIAGNOSIS — E871 Hypo-osmolality and hyponatremia: Secondary | ICD-10-CM | POA: Diagnosis present

## 2022-09-23 DIAGNOSIS — E870 Hyperosmolality and hypernatremia: Secondary | ICD-10-CM | POA: Diagnosis not present

## 2022-09-23 DIAGNOSIS — E782 Mixed hyperlipidemia: Secondary | ICD-10-CM

## 2022-09-23 DIAGNOSIS — Y92009 Unspecified place in unspecified non-institutional (private) residence as the place of occurrence of the external cause: Secondary | ICD-10-CM

## 2022-09-23 DIAGNOSIS — I7121 Aneurysm of the ascending aorta, without rupture: Secondary | ICD-10-CM | POA: Diagnosis present

## 2022-09-23 DIAGNOSIS — E876 Hypokalemia: Secondary | ICD-10-CM | POA: Diagnosis not present

## 2022-09-23 DIAGNOSIS — I21A1 Myocardial infarction type 2: Secondary | ICD-10-CM | POA: Diagnosis not present

## 2022-09-23 DIAGNOSIS — R6520 Severe sepsis without septic shock: Secondary | ICD-10-CM

## 2022-09-23 DIAGNOSIS — N179 Acute kidney failure, unspecified: Secondary | ICD-10-CM | POA: Diagnosis present

## 2022-09-23 DIAGNOSIS — I10 Essential (primary) hypertension: Secondary | ICD-10-CM | POA: Diagnosis not present

## 2022-09-23 DIAGNOSIS — Z794 Long term (current) use of insulin: Secondary | ICD-10-CM | POA: Diagnosis not present

## 2022-09-23 DIAGNOSIS — I214 Non-ST elevation (NSTEMI) myocardial infarction: Secondary | ICD-10-CM | POA: Diagnosis not present

## 2022-09-23 DIAGNOSIS — J45909 Unspecified asthma, uncomplicated: Secondary | ICD-10-CM | POA: Diagnosis present

## 2022-09-23 DIAGNOSIS — R0902 Hypoxemia: Secondary | ICD-10-CM | POA: Diagnosis not present

## 2022-09-23 DIAGNOSIS — W1830XA Fall on same level, unspecified, initial encounter: Secondary | ICD-10-CM | POA: Diagnosis present

## 2022-09-23 DIAGNOSIS — Z8616 Personal history of COVID-19: Secondary | ICD-10-CM | POA: Diagnosis not present

## 2022-09-23 DIAGNOSIS — B338 Other specified viral diseases: Secondary | ICD-10-CM | POA: Diagnosis present

## 2022-09-23 DIAGNOSIS — J9601 Acute respiratory failure with hypoxia: Secondary | ICD-10-CM | POA: Diagnosis not present

## 2022-09-23 DIAGNOSIS — G9341 Metabolic encephalopathy: Secondary | ICD-10-CM | POA: Diagnosis not present

## 2022-09-23 DIAGNOSIS — I25110 Atherosclerotic heart disease of native coronary artery with unstable angina pectoris: Secondary | ICD-10-CM | POA: Diagnosis present

## 2022-09-23 DIAGNOSIS — J9690 Respiratory failure, unspecified, unspecified whether with hypoxia or hypercapnia: Secondary | ICD-10-CM | POA: Diagnosis not present

## 2022-09-23 DIAGNOSIS — M6282 Rhabdomyolysis: Secondary | ICD-10-CM | POA: Diagnosis present

## 2022-09-23 DIAGNOSIS — J159 Unspecified bacterial pneumonia: Secondary | ICD-10-CM | POA: Diagnosis present

## 2022-09-23 DIAGNOSIS — R000 Tachycardia, unspecified: Secondary | ICD-10-CM | POA: Diagnosis not present

## 2022-09-23 DIAGNOSIS — W19XXXA Unspecified fall, initial encounter: Secondary | ICD-10-CM | POA: Diagnosis not present

## 2022-09-23 DIAGNOSIS — J9811 Atelectasis: Secondary | ICD-10-CM | POA: Diagnosis present

## 2022-09-23 DIAGNOSIS — E1165 Type 2 diabetes mellitus with hyperglycemia: Secondary | ICD-10-CM | POA: Diagnosis present

## 2022-09-23 DIAGNOSIS — Z66 Do not resuscitate: Secondary | ICD-10-CM | POA: Diagnosis not present

## 2022-09-23 DIAGNOSIS — R0603 Acute respiratory distress: Secondary | ICD-10-CM | POA: Diagnosis not present

## 2022-09-23 DIAGNOSIS — N400 Enlarged prostate without lower urinary tract symptoms: Secondary | ICD-10-CM

## 2022-09-23 DIAGNOSIS — A419 Sepsis, unspecified organism: Secondary | ICD-10-CM | POA: Diagnosis not present

## 2022-09-23 DIAGNOSIS — B974 Respiratory syncytial virus as the cause of diseases classified elsewhere: Secondary | ICD-10-CM | POA: Diagnosis present

## 2022-09-23 DIAGNOSIS — A4189 Other specified sepsis: Secondary | ICD-10-CM | POA: Diagnosis not present

## 2022-09-23 DIAGNOSIS — R7989 Other specified abnormal findings of blood chemistry: Secondary | ICD-10-CM | POA: Diagnosis not present

## 2022-09-23 DIAGNOSIS — I25118 Atherosclerotic heart disease of native coronary artery with other forms of angina pectoris: Secondary | ICD-10-CM | POA: Diagnosis not present

## 2022-09-23 DIAGNOSIS — R4182 Altered mental status, unspecified: Secondary | ICD-10-CM | POA: Diagnosis present

## 2022-09-23 DIAGNOSIS — Z1152 Encounter for screening for COVID-19: Secondary | ICD-10-CM | POA: Diagnosis not present

## 2022-09-23 DIAGNOSIS — Z515 Encounter for palliative care: Secondary | ICD-10-CM | POA: Diagnosis not present

## 2022-09-23 DIAGNOSIS — I5043 Acute on chronic combined systolic (congestive) and diastolic (congestive) heart failure: Secondary | ICD-10-CM | POA: Diagnosis not present

## 2022-09-23 DIAGNOSIS — I110 Hypertensive heart disease with heart failure: Secondary | ICD-10-CM | POA: Diagnosis present

## 2022-09-23 DIAGNOSIS — R531 Weakness: Secondary | ICD-10-CM

## 2022-09-23 DIAGNOSIS — E119 Type 2 diabetes mellitus without complications: Secondary | ICD-10-CM | POA: Diagnosis not present

## 2022-09-23 DIAGNOSIS — F03C0 Unspecified dementia, severe, without behavioral disturbance, psychotic disturbance, mood disturbance, and anxiety: Secondary | ICD-10-CM | POA: Diagnosis present

## 2022-09-23 LAB — URINALYSIS, ROUTINE W REFLEX MICROSCOPIC
Bacteria, UA: NONE SEEN
Bilirubin Urine: NEGATIVE
Glucose, UA: >=500 mg/dL — AB
Ketones, ur: 5 mg/dL — AB
Leukocytes,Ua: NEGATIVE
Nitrite: NEGATIVE
Protein, ur: 100 mg/dL — AB
Specific Gravity, Urine: 1.018 (ref 1.005–1.030)
pH: 5 (ref 5.0–8.0)

## 2022-09-23 LAB — CK: Total CK: 1001 U/L — ABNORMAL HIGH (ref 49–397)

## 2022-09-23 LAB — COMPREHENSIVE METABOLIC PANEL
ALT: 41 U/L (ref 0–44)
AST: 71 U/L — ABNORMAL HIGH (ref 15–41)
Albumin: 3 g/dL — ABNORMAL LOW (ref 3.5–5.0)
Alkaline Phosphatase: 50 U/L (ref 38–126)
Anion gap: 11 (ref 5–15)
BUN: 20 mg/dL (ref 8–23)
CO2: 28 mmol/L (ref 22–32)
Calcium: 8.1 mg/dL — ABNORMAL LOW (ref 8.9–10.3)
Chloride: 94 mmol/L — ABNORMAL LOW (ref 98–111)
Creatinine, Ser: 1.33 mg/dL — ABNORMAL HIGH (ref 0.61–1.24)
GFR, Estimated: 51 mL/min — ABNORMAL LOW (ref >60)
Glucose, Bld: 244 mg/dL — ABNORMAL HIGH (ref 70–99)
Potassium: 3.4 mmol/L — ABNORMAL LOW (ref 3.5–5.1)
Sodium: 133 mmol/L — ABNORMAL LOW (ref 135–145)
Total Bilirubin: 1.2 mg/dL (ref 0.3–1.2)
Total Protein: 5.9 g/dL — ABNORMAL LOW (ref 6.5–8.1)

## 2022-09-23 LAB — CBC WITH DIFFERENTIAL/PLATELET
Abs Immature Granulocytes: 0.05 10*3/uL (ref 0.00–0.07)
Basophils Absolute: 0 10*3/uL (ref 0.0–0.1)
Basophils Relative: 0 %
Eosinophils Absolute: 0.3 10*3/uL (ref 0.0–0.5)
Eosinophils Relative: 2 %
HCT: 37.6 % — ABNORMAL LOW (ref 39.0–52.0)
Hemoglobin: 13.7 g/dL (ref 13.0–17.0)
Immature Granulocytes: 0 %
Lymphocytes Relative: 15 %
Lymphs Abs: 1.8 10*3/uL (ref 0.7–4.0)
MCH: 34.2 pg — ABNORMAL HIGH (ref 26.0–34.0)
MCHC: 36.4 g/dL — ABNORMAL HIGH (ref 30.0–36.0)
MCV: 93.8 fL (ref 80.0–100.0)
Monocytes Absolute: 1.6 10*3/uL — ABNORMAL HIGH (ref 0.1–1.0)
Monocytes Relative: 13 %
Neutro Abs: 8.7 10*3/uL — ABNORMAL HIGH (ref 1.7–7.7)
Neutrophils Relative %: 70 %
Platelets: 160 10*3/uL (ref 150–400)
RBC: 4.01 MIL/uL — ABNORMAL LOW (ref 4.22–5.81)
RDW: 13.9 % (ref 11.5–15.5)
WBC: 12.5 10*3/uL — ABNORMAL HIGH (ref 4.0–10.5)
nRBC: 0 % (ref 0.0–0.2)

## 2022-09-23 LAB — CBG MONITORING, ED
Glucose-Capillary: 227 mg/dL — ABNORMAL HIGH (ref 70–99)
Glucose-Capillary: 180 mg/dL — ABNORMAL HIGH (ref 70–99)
Glucose-Capillary: 171 mg/dL — ABNORMAL HIGH (ref 70–99)

## 2022-09-23 LAB — MAGNESIUM: Magnesium: 1.7 mg/dL (ref 1.7–2.4)

## 2022-09-23 LAB — BLOOD GAS, VENOUS
Acid-Base Excess: 7.3 mmol/L — ABNORMAL HIGH (ref 0.0–2.0)
Bicarbonate: 29.5 mmol/L — ABNORMAL HIGH (ref 20.0–28.0)
O2 Saturation: 57.3 %
Patient temperature: 37
pCO2, Ven: 33 mmHg — ABNORMAL LOW (ref 44–60)
pH, Ven: 7.56 — ABNORMAL HIGH (ref 7.25–7.43)
pO2, Ven: 31 mmHg — CL (ref 32–45)

## 2022-09-23 LAB — PROCALCITONIN: Procalcitonin: 2.68 ng/mL

## 2022-09-23 LAB — GLUCOSE, CAPILLARY: Glucose-Capillary: 204 mg/dL — ABNORMAL HIGH (ref 70–99)

## 2022-09-23 LAB — PROTIME-INR
INR: 1.2 (ref 0.8–1.2)
Prothrombin Time: 15.4 seconds — ABNORMAL HIGH (ref 11.4–15.2)

## 2022-09-23 LAB — LACTIC ACID, PLASMA
Lactic Acid, Venous: 1.4 mmol/L (ref 0.5–1.9)
Lactic Acid, Venous: 1.6 mmol/L (ref 0.5–1.9)

## 2022-09-23 LAB — TSH: TSH: 0.71 u[IU]/mL (ref 0.350–4.500)

## 2022-09-23 LAB — PHOSPHORUS: Phosphorus: 2.5 mg/dL (ref 2.5–4.6)

## 2022-09-23 LAB — BRAIN NATRIURETIC PEPTIDE: B Natriuretic Peptide: 465.8 pg/mL — ABNORMAL HIGH (ref 0.0–100.0)

## 2022-09-23 MED ORDER — METOPROLOL SUCCINATE ER 50 MG PO TB24
50.0000 mg | ORAL_TABLET | Freq: Every day | ORAL | Status: DC
  Administered 2022-09-24: 50 mg via ORAL
  Filled 2022-09-23: qty 1

## 2022-09-23 MED ORDER — BENZONATATE 100 MG PO CAPS: 200.0000 mg | ORAL_CAPSULE | Freq: Three times a day (TID) | ORAL | Status: DC | PRN

## 2022-09-23 MED ORDER — INSULIN GLARGINE-YFGN 100 UNIT/ML ~~LOC~~ SOLN
25.0000 [IU] | Freq: Every day | SUBCUTANEOUS | Status: DC
  Administered 2022-09-23 – 2022-09-24 (×2): 25 [IU] via SUBCUTANEOUS
  Filled 2022-09-23 (×3): qty 0.25

## 2022-09-23 MED ORDER — ACETAMINOPHEN 325 MG PO TABS
650.0000 mg | ORAL_TABLET | Freq: Four times a day (QID) | ORAL | Status: DC | PRN
  Administered 2022-09-26 – 2022-09-28 (×5): 650 mg via ORAL
  Filled 2022-09-23 (×5): qty 2

## 2022-09-23 MED ORDER — MELATONIN 3 MG PO TABS
3.0000 mg | ORAL_TABLET | Freq: Every evening | ORAL | Status: DC | PRN
  Administered 2022-09-24: 3 mg via ORAL
  Filled 2022-09-23: qty 1

## 2022-09-23 MED ORDER — LACTATED RINGERS IV BOLUS
500.0000 mL | Freq: Once | INTRAVENOUS | Status: AC
  Administered 2022-09-23: 500 mL via INTRAVENOUS

## 2022-09-23 MED ORDER — POTASSIUM CHLORIDE 10 MEQ/100ML IV SOLN
10.0000 meq | INTRAVENOUS | Status: AC
  Administered 2022-09-23 (×4): 10 meq via INTRAVENOUS
  Filled 2022-09-23 (×4): qty 100

## 2022-09-23 MED ORDER — INSULIN ASPART 100 UNIT/ML IJ SOLN
0.0000 [IU] | Freq: Three times a day (TID) | INTRAMUSCULAR | Status: DC
  Administered 2022-09-23 (×2): 2 [IU] via SUBCUTANEOUS
  Administered 2022-09-23: 3 [IU] via SUBCUTANEOUS
  Administered 2022-09-24: 2 [IU] via SUBCUTANEOUS
  Administered 2022-09-24: 3 [IU] via SUBCUTANEOUS
  Administered 2022-09-24: 5 [IU] via SUBCUTANEOUS

## 2022-09-23 MED ORDER — ASPIRIN 81 MG PO TBEC
81.0000 mg | DELAYED_RELEASE_TABLET | Freq: Every day | ORAL | Status: DC
  Administered 2022-09-23 – 2022-09-28 (×5): 81 mg via ORAL
  Filled 2022-09-23 (×5): qty 1

## 2022-09-23 MED ORDER — GUAIFENESIN ER 600 MG PO TB12
600.0000 mg | ORAL_TABLET | Freq: Two times a day (BID) | ORAL | Status: DC
  Administered 2022-09-24 – 2022-09-28 (×10): 600 mg via ORAL
  Filled 2022-09-23 (×10): qty 1

## 2022-09-23 MED ORDER — FINASTERIDE 5 MG PO TABS
5.0000 mg | ORAL_TABLET | Freq: Every day | ORAL | Status: DC
  Administered 2022-09-24 – 2022-09-28 (×5): 5 mg via ORAL
  Filled 2022-09-23 (×5): qty 1

## 2022-09-23 MED ORDER — LACTATED RINGERS IV SOLN: INTRAVENOUS | Status: AC

## 2022-09-23 MED ORDER — HALOPERIDOL LACTATE 5 MG/ML IJ SOLN
5.0000 mg | Freq: Once | INTRAMUSCULAR | Status: AC | PRN
  Administered 2022-09-23: 5 mg via INTRAVENOUS
  Filled 2022-09-23: qty 1

## 2022-09-23 MED ORDER — ACETAMINOPHEN 650 MG RE SUPP
650.0000 mg | Freq: Four times a day (QID) | RECTAL | Status: DC | PRN
  Administered 2022-09-23 (×2): 650 mg via RECTAL
  Filled 2022-09-23 (×2): qty 1

## 2022-09-23 MED ORDER — TAMSULOSIN HCL 0.4 MG PO CAPS
0.4000 mg | ORAL_CAPSULE | Freq: Every day | ORAL | Status: DC
  Administered 2022-09-23 – 2022-09-28 (×5): 0.4 mg via ORAL
  Filled 2022-09-23 (×5): qty 1

## 2022-09-23 MED ORDER — POTASSIUM CHLORIDE IN NACL 20-0.9 MEQ/L-% IV SOLN
INTRAVENOUS | Status: AC
  Filled 2022-09-23: qty 1000

## 2022-09-23 NOTE — ED Notes (Signed)
RN notified provider pt initial temp  102.6 and rechecked temp 102.1 after Tylenol dose. Awaiting if any additional interventions should be provided from provider

## 2022-09-23 NOTE — ED Notes (Signed)
Upon entering room pt was not connected to vital sign monitoring. RN attempted to attach but pt immediately removed.

## 2022-09-23 NOTE — ED Notes (Signed)
RN applied pt pulse ox to ear lobe

## 2022-09-23 NOTE — ED Notes (Signed)
RN coban wrapped IV, applied lip resistant sock to prevent pt from pulling IV, and mittens

## 2022-09-23 NOTE — ED Notes (Signed)
Shift report received, assumed car of patient at this time.

## 2022-09-23 NOTE — ED Notes (Signed)
Son at bedside assisting pt eat dinner

## 2022-09-23 NOTE — ED Notes (Signed)
RN changed pt brief

## 2022-09-23 NOTE — ED Notes (Signed)
[place] paged for RN re critical report

## 2022-09-23 NOTE — ED Notes (Signed)
Pt is calm and resting in bed. RN was able to connect pt to monitor without incident

## 2022-09-23 NOTE — ED Notes (Addendum)
Patient attempting to get OOB, refusing to wear oxygen, not following staff direction, hitting at staff when attempting to redirect and not following directions to use urinal.  MD made aware for need for PRN.

## 2022-09-23 NOTE — ED Notes (Signed)
Patient will not keep his leads on. Patient has been placed onto monitor once before. RN is aware is and is getting mittens.

## 2022-09-23 NOTE — ED Notes (Signed)
RN and NT changed pt linens, placed soiled underwear in pt belonging bag, and new brief.

## 2022-09-23 NOTE — H&P (Signed)
History and Physical      Paul Terrell DOB: 06-29-1931 DOA: 09/14/2022  PCP: Tonia Ghent, MD  Patient coming from: home   I have personally briefly reviewed patient's old medical records in [place]  Chief Complaint: Generalized weakness  HPI: Paul Terrell is a 89 y.o. male with medical history significant for poorly controlled type 2 diabetes, BPH, hyperlipidemia, hard of hearing, who is admitted to [facility] on 08/23/2022 with acute hypoxic respiratory failure in the setting of RSV infection after presenting from home to [facility] ED for evaluation of generalized weakness resulting in multiple ground-level falls at home.  In the setting of the patient's altered mental status, the following history is provided by the patient's son, in addition to my discussions with the EDP and via chart review.  The patient had originally presented to [facility] emergency department on 09/21/2022 complaining of 3 to 4 days of rhinitis, rhinorrhea, cough, at which time RSV PCR was found to be positive, with corresponding COVID and influenza PCR negative.  No supplemental oxygen requirements noted at that time, and the patient was discharged from the ED to home, where he lives with his son.  However, over the last day, son is noted the patient became progressively more weak and a general sense, resulting in at least 2 ground-level mechanical falls, 1 of which was witnessed, in which the patient tripped while attempting to ambulate at home.  He did not hit his head as a component of this fall, there is no associated loss of consciousness.  The second fall was unwitnessed, and the son notes that he found the patient on the floor of their home, conscious, but too weak to be able to extricate himself from the floor.  While the duration of over which the patient had remained on the floor before being found by his son is not totally clear, son conveys that the duration was sometime less than  2 hours.  Son also conveys that over the last 1 to 2 days, the patient has become progressively more confused relative to his baseline mental status.  He confirms that the patient has no baseline supplemental oxygen requirements.   Per initial chart review, no prior echocardiogram on file.  The patient is a former smoker, having completely quit smoking in the late 1970s after smoking approximately 1 pack/day for 25 to 30 years, without considering resumption.  Does not carry a diagnosis of underlying COPD.      ED Course:  Vital signs in the ED were notable for the following: Temperature max 103; initial heart rate 117 decreasing into the 90s following interval IV fluids, as further detailed below; stop blood pressures in the 90s to 1 teens; respiratory rate 18-22, oxygen saturation initially 82% on room air, socially improving into the range of 94 to 95% on 4 L nasal cannula.  Labs were notable for the following: BMP notable for the following: Sodium 133, which corrects to approximately 136 when taking into account concomitant hyperglycemia, potassium 3.4, bicarbonate 27, creatinine 1.46 compared to 1.13 on 09/06/2022, glucose 317.  CPK 753.  CBC notable for will blood cell count 12,700 with 81% neutrophils, hemoglobin 15.2.  Urinalysis notable for no white blood cells, no bacteria, leukocyte esterase/nitrate negative, 100 protein, small hemoglobin without any red blood cells, and also also notable for 5 ketones.  Blood cultures x 2 were collected in the ED this evening.  Per my interpretation, EKG in ED demonstrated the  following: In comparison to most recent prior EKG from 12/28/2021, this evening's EKG appears to show sinus tachycardia with heart rate 116, incomplete left bundle branch block, nonspecific less than 1 mm ST depression in lead I, and unchanged ST depression in V4 through V6 when compared to most recent prior EKG in April 2023, will demonstrated no evidence of ST changes, including no  evidence of ST elevation.  Imaging and additional notable ED work-up: Chest x-ray, per formal radiology read, shows stable cardiomegaly with mild left basilar atelectasis, in the absence of any evidence of overt infiltrate, edema, effusion, or pneumothorax.  In the setting of unwitnessed fall at home on daily baby aspirin, CT head was pursued and showed no evidence of acute intracranial process, including no evidence of intracranial hemorrhage or any evidence of acute infarct.  While in the ED, the following were administered: Acetaminophen 1 g p.o. x 1, lactated Ringer's x 1.5 L bolus.  Subsequently, the patient was admitted for further evaluation management of acute hypoxic respiratory failure in the setting of RSV infection, complicated by acute metabolic encephalopathy, acute kidney injury, generalized weakness, and multiple ground-level mechanical falls, with mildly elevated CPK level.      Review of Systems: As per HPI otherwise 10 point review of systems negative.   Past Medical History:  Diagnosis Date   Arthritis    Basal cell carcinoma of scalp 12/16/07   Reexcision (Dr. Merril Abbe. Teressa Senter)   BPH (benign prostatic hyperplasia)    CAD (coronary artery disease) 11/01   stent placed   Cataract    Diabetes mellitus type II 11/01   GERD (gastroesophageal reflux disease)    occasionally   Hearing aid worn    HOH (hard of hearing)    Hyperlipemia 11/01   Hypertension 11/01   Ruptured disc, cervical    Neck C7 (Dr. Pearlie Oyster)    Past Surgical History:  Procedure Laterality Date   BASAL CELL CARCINOMA EXCISION     BRONCHIAL BRUSHINGS  05/16/2018   Procedure: ESOPHAGEAL BRUSHINGS;  Surgeon: Irving Copas., MD;  Location: [place];  Service: Gastroenterology;;   Lillard Anes  02/2000   Dr. Janus Molder - Right   CERVICAL FUSION     COLONOSCOPY     2012   COLONOSCOPY N/A 05/17/2018   Procedure: COLONOSCOPY;  Surgeon: Irving Copas., MD;  Location: [place];  Service: Gastroenterology;  Laterality: N/A;   ESOPHAGOGASTRODUODENOSCOPY (EGD) WITH PROPOFOL N/A 05/16/2018   Procedure: ESOPHAGOGASTRODUODENOSCOPY (EGD) WITH PROPOFOL ;  Surgeon: Rush Landmark Telford Nab., MD;  Location: [place];  Service: Gastroenterology;  Laterality: N/A;   HOT HEMOSTASIS N/A 05/16/2018   Procedure: HOT HEMOSTASIS (ARGON PLASMA COAGULATION/BICAP);  Surgeon: Irving Copas., MD;  Location: [place];  Service: Gastroenterology;  Laterality: N/A;   KNEE ARTHROSCOPY  09/1999   Right   LACERATION REPAIR  09/1999   Right Hand (Dr. Fredna Dow)   LITHOTRIPSY  1991   Dr. Elyse Jarvis   POLYPECTOMY  05/17/2018   Procedure: POLYPECTOMY;  Surgeon: Rush Landmark Telford Nab., MD;  Location: [place];  Service: Gastroenterology;;   ROTATOR CUFF REPAIR  02/05   Left    Social History:  reports that he quit smoking about 46 years ago. His smoking use included cigarettes. He has a 25.00 pack-year smoking history. He has quit using smokeless tobacco.  His smokeless tobacco use included chew. He reports that he does not drink alcohol and does not use drugs.   Allergies  Allergen Reactions   Nsaids Other (See Comments)  H/o anemia   Citrus Hives   Donepezil     Irritable with 74m dose but can tolerate 572mper day   Lisinopril     Presumed cause of cough   Metformin And Related     GI upset.     Zocor [Simvastatin] Other (See Comments)    Myalgia     Family History  Problem Relation Age of Onset   Stroke Mother        Lived 2 years   Heart disease Mother        CAD, Angioplasty X 2   Hypertension Mother    Heart disease Father        MI   Hypertension Father    Hypertension Sister    Hypertension Brother    Heart disease Brother        MI   Hyperlipidemia Brother    Heart disease Brother        MI    Family history reviewed and not pertinent    Prior to Admission medications   Medication Sig Start Date End Date Taking? Authorizing  Provider  acetaminophen (TYLENOL) 500 MG tablet Take 500 mg by mouth every 6 (six) hours as needed (for pain or headaches).    [provider]  amLODipine (NORVASC) 2.5 MG tablet Held as of 09/06/22 09/06/22   DuTonia GhentMD  aspirin 81 MG tablet Take 81 mg by mouth at bedtime.    [provider]  bismuth subsalicylate (PEPTO BISMOL) 262 MG/15ML suspension Take 30 mLs by mouth every 6 (six) hours as needed.    [provider]  blood glucose meter kit and supplies Dispense Accu-chek meter. Check sugar once daily. DX E11.9 10/07/19   DuTonia GhentMD  COMFORT EZ PEN NEEDLES 31G X 5 MM MISC Use daily with insulin pen. 05/22/22   DuTonia GhentMD  donepezil (ARICEPT) 5 MG tablet TAKE ONE TABLET BY MOUTH AT bedtime 08/28/22   DuTonia GhentMD  ferrous sulfate 325 (65 FE) MG tablet Take 1 tablet (325 mg total) by mouth daily with breakfast. 11/19/18   DuTonia GhentMD  finasteride (PROSCAR) 5 MG tablet Take 1 tablet (5 mg total) by mouth daily. 11/15/21   DuTonia GhentMD  glyBURIDE (DIABETA) 5 MG tablet TAKE TWO TABLETS BY MOUTH ONCE DAILY WITH BREAKFAST 11/15/21   DuTonia GhentMD  hydrochlorothiazide (MICROZIDE) 12.5 MG capsule Take 1 capsule (12.5 mg total) by mouth daily. 11/15/21   DuTonia GhentMD  Lancets ([facility]) lancets USE TO check blood glucose UP TO three times daily AS DIRECTED 02/28/22   DuTonia GhentMD  LANTUS SOLOSTAR 100 UNIT/ML Solostar Pen inject 46 units into THE SKIN daily 05/22/22   DuTonia GhentMD  Melatonin 5 MG TABS Take 5 mg by mouth at bedtime as needed.    [provider]  metoprolol succinate (TOPROL-XL) 50 MG 24 hr tablet Take 1 tablet (50 mg total) by mouth daily. Take with or immediately following a meal. 11/15/21   DuTonia GhentMD  mirtazapine (REMERON) 7.5 MG tablet Take 1 tablet (7.5 mg total) by mouth at bedtime. 11/15/21   DuTonia GhentMD  Multiple Vitamin (MULTIVITAMIN) tablet  Take 1 tablet by mouth daily. breakfast    [provider]  nitroGLYCERIN (NITROSTAT) 0.4 MG SL tablet Dissolve 1 tab under tongue as needed for chest pain. May repeat every 5 minutes x 2 doses.  If no relief call 9-1-1. 05/22/22   Tonia Ghent, MD  [facility] VERIO test strip USE TO check blood glucose UP TO three times daily AS DIRECTED 08/28/22   Tonia Ghent, MD  pantoprazole (PROTONIX) 40 MG tablet Take 1 tablet (40 mg total) by mouth daily. 11/15/21   Tonia Ghent, MD  pravastatin (PRAVACHOL) 40 MG tablet Take 1 tablet (40 mg total) by mouth at bedtime. 12/28/21   Croitoru, Mihai, MD  tamsulosin (FLOMAX) 0.4 MG CAPS capsule Take 1 capsule (0.4 mg total) by mouth at bedtime. 11/15/21   Tonia Ghent, MD     Objective    Physical Exam: Vitals:   09/23/22 0100 09/23/22 0145 09/23/22 0147 09/23/22 0200  BP: 119/67 (!) 90/39 96/73 (!) 102/58  Pulse: (!) 102 98 99 99  Resp: 20 18 (!) 22 20  Temp:      TempSrc:      SpO2: 92% 95% 93% 94%  Weight:      Height:        General: appears to be stated age; alert, confused; hard of hearing Skin: warm, dry, no rash Head:  AT/[place] Mouth:  Oral mucosa membranes appear dry, normal dentition Neck: supple; trachea midline Heart: Mildly tachycardic, but regular; did not appreciate any M/R/G Lungs: CTAB, did not appreciate any wheezes, rales, or rhonchi Abdomen: + BS; soft, ND, NT Vascular: 2+ pedal pulses b/l; 2+ radial pulses b/l Extremities: no peripheral edema, no muscle wasting Neuro: strength and sensation intact in upper and lower extremities b/l    Labs on Admission: I have personally reviewed following labs and imaging studies  CBC: Recent Labs  Lab 09/14/2022 2200  WBC 12.7*  NEUTROABS 10.3*  HGB 15.2  HCT 42.1  MCV 93.3  PLT 536   Basic Metabolic Panel: Recent Labs  Lab 08/26/2022 2200  NA 133*  K 3.4*  CL 93*  CO2 27  GLUCOSE 317*  BUN 20  CREATININE 1.46*  CALCIUM 8.6*   GFR: Estimated  Creatinine Clearance: 33.2 mL/min (A) (by C-G formula based on SCr of 1.46 mg/dL (H)). Liver Function Tests: No results for input(s): "AST", "ALT", "ALKPHOS", "BILITOT", "PROT", "ALBUMIN" in the last 168 hours. No results for input(s): "LIPASE", "AMYLASE" in the last 168 hours. No results for input(s): "AMMONIA" in the last 168 hours. Coagulation Profile: No results for input(s): "INR", "PROTIME" in the last 168 hours. Cardiac Enzymes: Recent Labs  Lab 09/17/2022 2200  CKTOTAL 753*   BNP (last 3 results) No results for input(s): "PROBNP" in the last 8760 hours. HbA1C: No results for input(s): "HGBA1C" in the last 72 hours. CBG: No results for input(s): "GLUCAP" in the last 168 hours. Lipid Profile: No results for input(s): "CHOL", "HDL", "LDLCALC", "TRIG", "CHOLHDL", "LDLDIRECT" in the last 72 hours. Thyroid Function Tests: No results for input(s): "TSH", "T4TOTAL", "FREET4", "T3FREE", "THYROIDAB" in the last 72 hours. Anemia Panel: No results for input(s): "VITAMINB12", "FOLATE", "FERRITIN", "TIBC", "IRON", "RETICCTPCT" in the last 72 hours. Urine analysis:    Component Value Date/Time   COLORURINE YELLOW 09/23/2022 0055   APPEARANCEUR HAZY (A) 09/23/2022 0055   LABSPEC 1.018 09/23/2022 0055   PHURINE 5.0 09/23/2022 0055   GLUCOSEU >=500 (A) 09/23/2022 0055   HGBUR SMALL (A) 09/23/2022 0055   BILIRUBINUR NEGATIVE 09/23/2022 0055   KETONESUR 5 (A) 09/23/2022 0055   PROTEINUR 100 (A) 09/23/2022 0055   NITRITE NEGATIVE 09/23/2022 0055   LEUKOCYTESUR NEGATIVE 09/23/2022 0055    Radiological Exams on Admission: CT  HEAD WO CONTRAST (5MM)  Result Date: 09/14/2022 CLINICAL DATA:  Head trauma with headaches, initial encounter EXAM: CT HEAD WITHOUT CONTRAST TECHNIQUE: Contiguous axial images were obtained from the base of the skull through the vertex without intravenous contrast. RADIATION DOSE REDUCTION: This exam was performed according to the departmental dose-optimization  program which includes automated exposure control, adjustment of the mA and/or kV according to patient size and/or use of iterative reconstruction technique. COMPARISON:  08/15/2019 FINDINGS: Brain: No evidence of acute infarction, hemorrhage, hydrocephalus, extra-axial collection or mass lesion/mass effect. Chronic atrophic and ischemic changes are again seen. Vascular: No hyperdense vessel or unexpected calcification. Skull: Normal. Negative for fracture or focal lesion. Sinuses/Orbits: No acute finding. Other: None. IMPRESSION: Chronic atrophic and ischemic changes without acute abnormality. Electronically Signed   By: Inez Catalina M.D.   On: 09/01/2022 23:01   DG Chest Portable 1 View  Result Date: 09/14/2022 CLINICAL DATA:  Altered mental status. EXAM: PORTABLE CHEST 1 VIEW COMPARISON:  January 17, 2020 FINDINGS: There is stable mild to moderate severity enlargement of the cardiac silhouette. Mild linear atelectasis is seen within the left lung base. There is no evidence of a pleural effusion or pneumothorax. The visualized skeletal structures are unremarkable. IMPRESSION: Stable cardiomegaly with mild left basilar linear atelectasis. Electronically Signed   By: Virgina Norfolk M.D.   On: 09/02/2022 22:29      Assessment/Plan   Principal Problem:   Acute hypoxic respiratory failure (HCC) Active Problems:   DM2 (diabetes mellitus, type 2) (HCC)   Hyperlipidemia   BPH (benign prostatic hyperplasia)   Essential hypertension   Acute metabolic encephalopathy   RSV (respiratory syncytial virus infection)   Generalized weakness   Fall at home, initial encounter   AKI (acute kidney injury) ([place])   Hypokalemia   Severe sepsis ([place])      #) Acute hypoxic respiratory failure: in the context of acute respiratory symptoms and no known baseline supplemental O2 requirements, presenting O2 sat note to be in the low 80s on room air, socially improving into the mid 90s on 4 L nasal cannula. Appears  to be on basis of RSV infection, per results of positive RSV PCR on 09/21/2022, without any ensuing improvement in the patient's mild respiratory symptoms in the interval. Presenting CXR shows mild left basilar atelectasis, but otherwise no evidence of acute cardiopulmonary process.  No known chronic underlying pulmonary conditions.   While the duration and trajectory of the patient's mild acute respiratory symptoms appear less consistent with a secondary bacterial pneumonia, will pursue procalcitonin level given finding of leukocytosis with neutrophilic predominance, noting increased risk for radiographic false negative on presenting chest x-ray due to clinical evidence to suggest presenting dehydration.  In terms of other considered etiologies, ACS appears less likely at this time in the absence of any recent CP and in the context of presenting EKG showing no e/o acute ischemic process noting that nonspecific ST depression in V4 through V6 was also noted on most recent prior EKG from April 2023. No clinical or radiographic evidence to suggest acutely decompensated heart failure at this time. Clinically, presentation is less suggestive of acute PE at this time. COVID-19/Influenza PCR were negative when checked in the ED on 09/21/2022.    Plan: further evaluation/management of presenting RSV infection, namely supportive measures that include guaifenesin, prn Tessalon Perles. Monitor continuous pulse ox with prn supplemental O2 to maintain O2 sats greater than or equal to 92%. monitor on telemetry. CMP/CBC in the AM. Check serum  Mg and Phos levels. Check blood gas.  Add on procalcitonin level.  Check BNP.              #) Severe sepsis due to RSV infection: In the setting of patient's acute respiratory complaints that include rhinitis, rhinorrhea, new onset nonproductive cough over the last 4 to 5 days, with positive RSV PCR on 09/21/2022, SIRS criteria met via presenting objective fever,  leukocytosis, tachycardia, tachypnea.  Lactic acid level currently pending.  Of note, given the associated presence of suspected end organ damage in the form of concominant presenting acute hypoxic respiratory failure as well as acute metabolic encephalopathy, criteria are met for pt's sepsis to be considered severe in nature. However, in the absence of lactic acid level that is greater than or equal to 4.0, and in the absence of any associated hypotension refractory to IVF's, there are no indications for administration of a 30 mL/kg IVF bolus at this time.   Of note, no current evidence of underlying bacterial infection, including chest x-ray that shows no evidence of acute cardiopulmonary process, including no evidence of infiltrate, while urinalysis is also inconsistent with UTI.  Will, however, add on procalcitonin level to further evaluate, as further detailed above.  However, in the context of an underlying viral infection, without any overt evidence of underlying bacterial infection, will refrain from initiation of IV antibiotics at this time.  Of note, blood cultures x 2 were collected in the ED this evening.   Plan: CBC w/ diff and CMP in AM.  Follow for results of blood cx's x 2. Abx: Check stat lactic acid level.  Continuous lactated Ringer's ordered.  Add on procalcitonin level.  Further evaluation management of presenting RSV infection, namely the supportive measures as detailed above, including prn Tessalon Perles, Mucinex.             #) Acute metabolic encephalopathy: 1 to 2 days of altered mental status, confusion relative to baseline mental status, which appears to be on the basis of physiologic stressors stemming from presenting acute hypoxic respiratory failure due to RSV infection with associated severe sepsis. Also assessing for bacterial pneumonia via procalcitonin level, as further detailed above.  Differential also includes the possibility of early rhabdomyolysis given  mildly elevated CPK level in the context of patient being found down over the course of a few hours at home, with urinalysis demonstrating potential early myoglobinuria.  Overall, does not meet criteria for rhabdomyolysis given very mild elevation in CPK level at this time, but will closely monitor ensuing CPK trend, while providing gentle IV fluids, as further detailed below.  No obvious contributory pharmacologic factors. No overt acute focal neurologic deficits to suggest a contribution from an underlying acute CVA, will noting the presenting CTh is no evidence of acute intracranial process, as above. Seizures are also felt to be less likely. Will check VBG to evaluate for any contribution from hypercapnic encephalopathy, will noting the patient's long former smoking history.   Plan: fall precautions. Repeat CMP/CBC in the AM. Check magnesium level. check TSH, vbg.  Add on procalcitonin level.  Repeat CPK level in the morning with interval IV fluids, as above.  Delirium precautions ordered.  Check INR.            #) Generalized weakness: Son conveys that the patient has exhibited 1 to 2 days of generalized weakness, without overt evidence of acute focal weakness, also noting CT head shows no evidence of acute or cranial process including no evidence  of intracranial hemorrhage nor any evidence of acute infarct..  Suspect contributions from physiologic stress as a consequence of presenting acute hypoxic respiratory failure in setting of RSV infection complicated by associated viral induced severe sepsis, as above.  Appears to be a contributing factor leading to the patient's 2 prior ground-level mechanical falls at home, without overt associated loss consciousness, as further detailed above.   Plan: Fall precautions.  Further evaluation management of acute hypoxic respiratory failure in the setting of RSV infection, as above.  Check TSH, VBG.  Physical therapy consult was for the  morning.            #) Acute Kidney Injury:  as quantified above.  Suspect prerenal influences that include diminished renal perfusion as a consequence of decline in oxygen delivery capacity as result of presenting acute hypoxic respiratory failure as well as relative intravascular depletion in setting of severe sepsis due to RSV infection, as further detailed above.  Will also closely monitor for development of rhabdomyolysis, noting very mildly elevated initial CPK level, will noting the presenting urinalysis with microscopy shows small hemoglobin without any RBCs, will also showing no white blood cells and 100 protein.  Suspect additional prerenal contribution from intravascular depletion from recent decline in oral intake in the setting of the patient's presenting altered mental status, as evidenced by presenting ketonuria which is suspected to be starvation in nature.    Plan: monitor strict I's & O's and daily weights. Attempt to avoid nephrotoxic agents. Refrain from NSAIDs. Repeat CMP in the morning. Check serum magnesium level. Add-on random urine sodium and random urine creatinine.  Lactated Ringer's at 75 cc/h x 8 hours, as above.  Repeat CPK level in the morning. Further evaluation management of acute hypoxic respiratory failure in the setting of RSV infection, as further detailed above.           #) Hypokalemia: Presenting serum potassium level found to be mildly low at 3.4 which may demonstrate further exacerbation with improving blood sugars noting initial hyperglycemia.  However, in the setting of concomitant acute kidney injury, will refrain from aggressive potassium supplementation at this time, will noting interval administration of 1.5 L of LR.  Will provide additional continuous lactated Ringer's, and closely monitor for ensuing potassium trend via updated CMP in the morning, as further detailed below.  Plan: Lactated Ringer's at 75 cc/h x 8 hours.  CMP in the  morning.  Add on serum magnesium level.             #) Type 2 Diabetes Mellitus: documented history of such. Home insulin regimen: Lantus 46 units subcu daily. Home oral hypoglycemic agents: Glyburide. presenting blood sugar: 317 without corresponding anion gap metabolic acidosis. Most recent A1c noted to be 8.0 on 09/06/2022. in terms of initial dose of basal insulin to be started during this hospitalization, will resume approximately half of outpatient dose in order to reduce risk for ensuing hypoglycemia suspect relative hypoglycemia contribution from physiologic stress as a consequence of presenting acute hypoxic respiratory failure in the setting of RSV infection with associated severe sepsis, as above.   Plan: accuchecks QAC and HS with low dose SSI. hold home oral hypoglycemic agents during this hospitalization.  Lantus 25 units subcu daily, as above.  IV fluids, as above.             #) Benign Prostatic Hyperplasia:  documented h/o such; on finasteride and tamsulosin as outpatient.   Plan: monitor strict I's & O's and daily  weights. Repeat CMP in AM.  continue home tamsulosin.  continue home finasteride.              #) Essential Hypertension: documented h/o such, with outpatient antihypertensive regimen including amlodipine, HCTZ, metoprolol succinate.  SBP's in the ED today: 90s to 110s mmHg. the context of these borderline soft blood pressures, will be cautious with resumption of home and hypertensive medications, as further detailed below.  Plan: Close monitoring of subsequent BP via routine VS. hold home amlodipine and HCTZ for now, as above.  Will resume metoprolol succinate in the morning, but with hold parameters for hypotension.              #) Hyperlipidemia: documented h/o such. On pravastatin as outpatient.  However, given presenting mildly elevated CPK level, will hold home statin for now.  Plan: Hold home statin for now.  Repeat CPK  level in the morning.      DVT prophylaxis: SCD's   Code Status: DNR/DNI, consistent with most recent code status documentation from previous hospitalization. Disposition Plan: Per Rounding Team Consults called: none;  Admission status: Inpatient     I SPENT GREATER THAN 75  MINUTES IN CLINICAL CARE TIME/MEDICAL DECISION-MAKING IN COMPLETING THIS ADMISSION.      [place] DO [facility]  From [place]   09/23/2022, 2:56 AM

## 2022-09-23 NOTE — ED Notes (Signed)
Pt allowed RN to check vitals but pulled cords off.

## 2022-09-23 NOTE — ED Notes (Signed)
Pt son at bedside. Pt son states father is usually more alert and communicates but when he gets sick he becomes altered.

## 2022-09-23 NOTE — ED Notes (Signed)
Pt is hanging legs on side of bet attempting to get up. RN redirected pt limbs in bed. Pt will answer some yes or no questions.

## 2022-09-23 NOTE — Progress Notes (Signed)
PROGRESS NOTE    Paul Terrell  QPY:[id number] DOB: [birth year] DOA: 09/13/2022 PCP: Tonia Ghent, MD  Outpatient Specialists:     Brief Narrative:  Patient is an 89 year old male with past medical history significant for poorly controlled diabetes mellitus, hyperlipidemia, BPH and hard of hearing.  Patient was admitted with acute hypoxic respiratory failure, RSV infection, and documented severe sepsis, acute encephalopathy, acute kidney injury with electrolyte abnormalities.  Patient remains encephalopathic and cannot give any significant history.  VBG done earlier today revealed pH of 7.56, pCO2 of 33 and pO2 of 31.  Renal panel revealed sodium of 133, potassium of 3.4, chloride 94, CO2 28, BUN of 20 and serum creatinine of 1.33, blood sugar of 244.  Albumin is 3.  Cardiac BNP is 465 with CPK of 1001.  CBC revealed WBC of 12.5, hemoglobin of 13.7, hematocrit of 37.6, MCV of 93.8 with platelet count of 160.  CT head did not reveal any acute abnormalities.  Chest x-ray revealed stable cardiomegaly with mild left basilar linear atelectasis.  RSV is positive.   Assessment & Plan:   Principal Problem:   Acute hypoxic respiratory failure (HCC) Active Problems:   DM2 (diabetes mellitus, type 2) (HCC)   Hyperlipidemia   BPH (benign prostatic hyperplasia)   Essential hypertension   Acute metabolic encephalopathy   RSV (respiratory syncytial virus infection)   Generalized weakness   Fall at home, initial encounter   AKI (acute kidney injury) ([place])   Hypokalemia   Severe sepsis ([place])   Acute hypoxic respiratory failure: -RSV infection. -Continue supplemental oxygen. -Chest x-ray shows mild bibasilar atelectasis. -Supportive care.  Sepsis secondary to RSV: -Supportive care. -Sepsis physiology is resolving.  Acute encephalopathy: -Likely combined toxic and metabolic. -Hydrate patient. -Monitor and replete abnormal electrolytes. -Supportive care.  Generalized weakness: -Will  likely improve with hydration and management of RSV.  Acute kidney injury: -Suspect prerenal. -Cannot rule out ATN. -Continue to monitor renal function and electrolytes. -Avoid nephrotoxins. -Keep MAP greater than 65 mmHg.  Hypokalemia: -May be indicative of degree of volume depletion. -Replete. -IV fluids.  Diabetes mellitus type 2: -Continue insulin. -Monitor closely.  BPH: -Continue current meds.  Hypertension: -Goal blood pressure should be less than 130/80 mmHg. -Continue to optimize.  Hyperlipidemia: -Monitor CPK.   DVT prophylaxis: SCD. Code Status: DO NOT RESUSCITATE. Family Communication:  Disposition Plan: This will depend on hospital course.   Consultants:  None.  Procedures:  None.  Antimicrobials:  None.   Subjective: No history from patient.  Objective: Vitals:   09/23/22 0530 09/23/22 0545 09/23/22 1131 09/23/22 1230  BP: (!) 119/48 (!) 109/55 (!) 171/135 132/60  Pulse: (!) 105 (!) 101 (!) 106   Resp: '20 20 17   '$ Temp:   98 F (36.7 C)   TempSrc:      SpO2: 91% 92% 93%   Weight:      Height:        Intake/Output Summary (Last 24 hours) at 09/23/2022 1403 Last data filed at 09/23/2022 0249 Gross per 24 hour  Intake 1500 ml  Output 600 ml  Net 900 ml   Filed Weights   09/19/2022 2103  Weight: 82 kg    Examination:  General exam: Patient remains lethargic with no significant communication.  Very dry buccal mucosa. Respiratory system: Decreased air entry. Cardiovascular system: S1 & S2 heard Gastrointestinal system: Abdomen is nondistended, soft and nontender. N Central nervous system: Patient remains lethargic. Extremities: No leg edema.  Data Reviewed: I have  personally reviewed following labs and imaging studies  CBC: Recent Labs  Lab 08/24/2022 2200 09/23/22 0425  WBC 12.7* 12.5*  NEUTROABS 10.3* 8.7*  HGB 15.2 13.7  HCT 42.1 37.6*  MCV 93.3 93.8  PLT 180 841   Basic Metabolic Panel: Recent Labs  Lab  08/28/2022 2200 09/23/22 0425  NA 133* 133*  K 3.4* 3.4*  CL 93* 94*  CO2 27 28  GLUCOSE 317* 244*  BUN 20 20  CREATININE 1.46* 1.33*  CALCIUM 8.6* 8.1*  MG  --  1.7  PHOS  --  2.5   GFR: Estimated Creatinine Clearance: 36.4 mL/min (A) (by C-G formula based on SCr of 1.33 mg/dL (H)). Liver Function Tests: Recent Labs  Lab 09/23/22 0425  AST 71*  ALT 41  ALKPHOS 50  BILITOT 1.2  PROT 5.9*  ALBUMIN 3.0*   No results for input(s): "LIPASE", "AMYLASE" in the last 168 hours. No results for input(s): "AMMONIA" in the last 168 hours. Coagulation Profile: Recent Labs  Lab 09/23/22 0425  INR 1.2   Cardiac Enzymes: Recent Labs  Lab 09/18/2022 2200 09/23/22 0425  CKTOTAL 753* 1,001*   BNP (last 3 results) No results for input(s): "PROBNP" in the last 8760 hours. HbA1C: No results for input(s): "HGBA1C" in the last 72 hours. CBG: Recent Labs  Lab 09/23/22 0814 09/23/22 1153  GLUCAP 227* 180*   Lipid Profile: No results for input(s): "CHOL", "HDL", "LDLCALC", "TRIG", "CHOLHDL", "LDLDIRECT" in the last 72 hours. Thyroid Function Tests: Recent Labs    09/23/22 0425  TSH 0.710   Anemia Panel: No results for input(s): "VITAMINB12", "FOLATE", "FERRITIN", "TIBC", "IRON", "RETICCTPCT" in the last 72 hours. Urine analysis:    Component Value Date/Time   COLORURINE YELLOW 09/23/2022 0055   APPEARANCEUR HAZY (A) 09/23/2022 0055   LABSPEC 1.018 09/23/2022 0055   PHURINE 5.0 09/23/2022 0055   GLUCOSEU >=500 (A) 09/23/2022 0055   HGBUR SMALL (A) 09/23/2022 0055   BILIRUBINUR NEGATIVE 09/23/2022 0055   KETONESUR 5 (A) 09/23/2022 0055   PROTEINUR 100 (A) 09/23/2022 0055   NITRITE NEGATIVE 09/23/2022 0055   LEUKOCYTESUR NEGATIVE 09/23/2022 0055   Sepsis Labs: '@LABRCNTIP'$ (procalcitonin:4,lacticidven:4)  ) Recent Results (from the past 240 hour(s))  Resp panel by RT-PCR (RSV, Flu A&B, Covid) Anterior Nasal Swab     Status: Abnormal   Collection Time: 09/21/22 10:56 AM    Specimen: Anterior Nasal Swab  Result Value Ref Range Status   SARS Coronavirus 2 by RT PCR NEGATIVE NEGATIVE Final    Comment: (NOTE) SARS-CoV-2 target nucleic acids are NOT DETECTED.  The SARS-CoV-2 RNA is generally detectable in upper respiratory specimens during the acute phase of infection. The lowest concentration of SARS-CoV-2 viral copies this assay can detect is 138 copies/mL. A negative result does not preclude SARS-Cov-2 infection and should not be used as the sole basis for treatment or other patient management decisions. A negative result may occur with  improper specimen collection/handling, submission of specimen other than nasopharyngeal swab, presence of viral mutation(s) within the areas targeted by this assay, and inadequate number of viral copies(<138 copies/mL). A negative result must be combined with clinical observations, patient history, and epidemiological information. The expected result is Negative.  Fact Sheet for Patients:  EntrepreneurPulse.com.au  Fact Sheet for Healthcare Providers:  IncredibleEmployment.be  This test is no t yet approved or cleared by the [place] FDA and  has been authorized for detection and/or diagnosis of SARS-CoV-2 by FDA under an Emergency Use Authorization (EUA). This EUA  will remain  in effect (meaning this test can be used) for the duration of the COVID-19 declaration under Section 564(b)(1) of the Act, 21 U.S.C.section 360bbb-3(b)(1), unless the authorization is terminated  or revoked sooner.       Influenza A by PCR NEGATIVE NEGATIVE Final   Influenza B by PCR NEGATIVE NEGATIVE Final    Comment: (NOTE) The Xpert Xpress SARS-CoV-2/FLU/RSV plus assay is intended as an aid in the diagnosis of influenza from Nasopharyngeal swab specimens and should not be used as a sole basis for treatment. Nasal washings and aspirates are unacceptable for Xpert Xpress  SARS-CoV-2/FLU/RSV testing.  Fact Sheet for Patients: EntrepreneurPulse.com.au  Fact Sheet for Healthcare Providers: IncredibleEmployment.be  This test is not yet approved or cleared by the [place] FDA and has been authorized for detection and/or diagnosis of SARS-CoV-2 by FDA under an Emergency Use Authorization (EUA). This EUA will remain in effect (meaning this test can be used) for the duration of the COVID-19 declaration under Section 564(b)(1) of the Act, 21 U.S.C. section 360bbb-3(b)(1), unless the authorization is terminated or revoked.     Resp Syncytial Virus by PCR POSITIVE (A) NEGATIVE Final    Comment: (NOTE) Fact Sheet for Patients: EntrepreneurPulse.com.au  Fact Sheet for Healthcare Providers: IncredibleEmployment.be  This test is not yet approved or cleared by the [place] FDA and has been authorized for detection and/or diagnosis of SARS-CoV-2 by FDA under an Emergency Use Authorization (EUA). This EUA will remain in effect (meaning this test can be used) for the duration of the COVID-19 declaration under Section 564(b)(1) of the Act, 21 U.S.C. section 360bbb-3(b)(1), unless the authorization is terminated or revoked.  Performed at [facility], [address], [place], [place] [address]   Blood culture (routine x 2)     Status: None (Preliminary result)   Collection Time: 08/28/2022  9:34 PM   Specimen: BLOOD  Result Value Ref Range Status   Specimen Description BLOOD LEFT ANTECUBITAL  Final   Special Requests   Final    BOTTLES DRAWN AEROBIC AND ANAEROBIC Blood Culture results may not be optimal due to an excessive volume of blood received in culture bottles   Culture   Final    NO GROWTH < 12 HOURS Performed at [facility] [address], [place], [place] [address]    Report Status PENDING  Incomplete         Radiology Studies: CT HEAD  WO CONTRAST (5MM)  Result Date: 09/20/2022 CLINICAL DATA:  Head trauma with headaches, initial encounter EXAM: CT HEAD WITHOUT CONTRAST TECHNIQUE: Contiguous axial images were obtained from the base of the skull through the vertex without intravenous contrast. RADIATION DOSE REDUCTION: This exam was performed according to the departmental dose-optimization program which includes automated exposure control, adjustment of the mA and/or kV according to patient size and/or use of iterative reconstruction technique. COMPARISON:  08/15/2019 FINDINGS: Brain: No evidence of acute infarction, hemorrhage, hydrocephalus, extra-axial collection or mass lesion/mass effect. Chronic atrophic and ischemic changes are again seen. Vascular: No hyperdense vessel or unexpected calcification. Skull: Normal. Negative for fracture or focal lesion. Sinuses/Orbits: No acute finding. Other: None. IMPRESSION: Chronic atrophic and ischemic changes without acute abnormality. Electronically Signed   By: Inez Catalina M.D.   On: 09/19/2022 23:01   DG Chest Portable 1 View  Result Date: 08/25/2022 CLINICAL DATA:  Altered mental status. EXAM: PORTABLE CHEST 1 VIEW COMPARISON:  January 17, 2020 FINDINGS: There is stable mild to moderate severity enlargement  of the cardiac silhouette. Mild linear atelectasis is seen within the left lung base. There is no evidence of a pleural effusion or pneumothorax. The visualized skeletal structures are unremarkable. IMPRESSION: Stable cardiomegaly with mild left basilar linear atelectasis. Electronically Signed   By: Virgina Norfolk M.D.   On: 08/25/2022 22:29        Scheduled Meds:  aspirin EC  81 mg Oral QHS   finasteride  5 mg Oral Daily   guaiFENesin  600 mg Oral BID   insulin aspart  0-9 Units Subcutaneous TID WC   insulin glargine-yfgn  25 Units Subcutaneous Daily   metoprolol succinate  50 mg Oral Daily   tamsulosin  0.4 mg Oral QHS   Continuous Infusions:   LOS: 0 days    Time  spent: 55 minutes.    Dana Allan, MD  [facility] Pager #: [phone] 7PM-7AM contact night coverage as above

## 2022-09-23 DEATH — deceased

## 2022-09-24 ENCOUNTER — Inpatient Hospital Stay (HOSPITAL_COMMUNITY): Payer: Medicare Other

## 2022-09-24 DIAGNOSIS — Z7189 Other specified counseling: Secondary | ICD-10-CM

## 2022-09-24 DIAGNOSIS — G9341 Metabolic encephalopathy: Secondary | ICD-10-CM | POA: Diagnosis not present

## 2022-09-24 DIAGNOSIS — R410 Disorientation, unspecified: Secondary | ICD-10-CM | POA: Insufficient documentation

## 2022-09-24 DIAGNOSIS — R000 Tachycardia, unspecified: Secondary | ICD-10-CM | POA: Insufficient documentation

## 2022-09-24 DIAGNOSIS — J9601 Acute respiratory failure with hypoxia: Secondary | ICD-10-CM | POA: Diagnosis not present

## 2022-09-24 DIAGNOSIS — N179 Acute kidney failure, unspecified: Secondary | ICD-10-CM | POA: Diagnosis not present

## 2022-09-24 DIAGNOSIS — N400 Enlarged prostate without lower urinary tract symptoms: Secondary | ICD-10-CM | POA: Diagnosis not present

## 2022-09-24 LAB — GLUCOSE, CAPILLARY
Glucose-Capillary: 199 mg/dL — ABNORMAL HIGH (ref 70–99)
Glucose-Capillary: 223 mg/dL — ABNORMAL HIGH (ref 70–99)
Glucose-Capillary: 293 mg/dL — ABNORMAL HIGH (ref 70–99)
Glucose-Capillary: 322 mg/dL — ABNORMAL HIGH (ref 70–99)

## 2022-09-24 LAB — CBC WITH DIFFERENTIAL/PLATELET
Abs Immature Granulocytes: 0.06 10*3/uL (ref 0.00–0.07)
Basophils Absolute: 0 10*3/uL (ref 0.0–0.1)
Basophils Relative: 0 %
Eosinophils Absolute: 0 10*3/uL (ref 0.0–0.5)
Eosinophils Relative: 0 %
HCT: 39.3 % (ref 39.0–52.0)
Hemoglobin: 14 g/dL (ref 13.0–17.0)
Immature Granulocytes: 0 %
Lymphocytes Relative: 11 %
Lymphs Abs: 1.7 10*3/uL (ref 0.7–4.0)
MCH: 33.7 pg (ref 26.0–34.0)
MCHC: 35.6 g/dL (ref 30.0–36.0)
MCV: 94.7 fL (ref 80.0–100.0)
Monocytes Absolute: 1.5 10*3/uL — ABNORMAL HIGH (ref 0.1–1.0)
Monocytes Relative: 10 %
Neutro Abs: 11.8 10*3/uL — ABNORMAL HIGH (ref 1.7–7.7)
Neutrophils Relative %: 79 %
Platelets: 157 10*3/uL (ref 150–400)
RBC: 4.15 MIL/uL — ABNORMAL LOW (ref 4.22–5.81)
RDW: 13.6 % (ref 11.5–15.5)
WBC: 15.1 10*3/uL — ABNORMAL HIGH (ref 4.0–10.5)
nRBC: 0 % (ref 0.0–0.2)

## 2022-09-24 LAB — RENAL FUNCTION PANEL
Albumin: 2.9 g/dL — ABNORMAL LOW (ref 3.5–5.0)
Anion gap: 12 (ref 5–15)
BUN: 21 mg/dL (ref 8–23)
CO2: 25 mmol/L (ref 22–32)
Calcium: 8.5 mg/dL — ABNORMAL LOW (ref 8.9–10.3)
Chloride: 98 mmol/L (ref 98–111)
Creatinine, Ser: 1.17 mg/dL (ref 0.61–1.24)
GFR, Estimated: 60 mL/min — ABNORMAL LOW (ref >60)
Glucose, Bld: 211 mg/dL — ABNORMAL HIGH (ref 70–99)
Phosphorus: 1.5 mg/dL — ABNORMAL LOW (ref 2.5–4.6)
Potassium: 3.4 mmol/L — ABNORMAL LOW (ref 3.5–5.1)
Sodium: 135 mmol/L (ref 135–145)

## 2022-09-24 LAB — BLOOD GAS, ARTERIAL
Acid-base deficit: 8.5 mmol/L — ABNORMAL HIGH (ref 0.0–2.0)
Bicarbonate: 21.4 mmol/L (ref 20.0–28.0)
Drawn by: 36277
O2 Saturation: 78 %
Patient temperature: 36.8
pCO2 arterial: 62 mmHg — ABNORMAL HIGH (ref 32–48)
pH, Arterial: 7.14 — CL (ref 7.35–7.45)
pO2, Arterial: 53 mmHg — ABNORMAL LOW (ref 83–108)

## 2022-09-24 LAB — CK: Total CK: 1061 U/L — ABNORMAL HIGH (ref 49–397)

## 2022-09-24 LAB — MAGNESIUM: Magnesium: 1.8 mg/dL (ref 1.7–2.4)

## 2022-09-24 LAB — BRAIN NATRIURETIC PEPTIDE: B Natriuretic Peptide: 491.8 pg/mL — ABNORMAL HIGH (ref 0.0–100.0)

## 2022-09-24 LAB — MRSA NEXT GEN BY PCR, NASAL: MRSA by PCR Next Gen: NOT DETECTED

## 2022-09-24 LAB — TROPONIN I (HIGH SENSITIVITY)
Troponin I (High Sensitivity): 1104 ng/L (ref <18)
Troponin I (High Sensitivity): 891 ng/L (ref <18)

## 2022-09-24 MED ORDER — HALOPERIDOL LACTATE 5 MG/ML IJ SOLN
2.0000 mg | Freq: Four times a day (QID) | INTRAMUSCULAR | Status: DC | PRN
  Administered 2022-09-24 – 2022-09-29 (×8): 2 mg via INTRAVENOUS
  Filled 2022-09-24 (×10): qty 1

## 2022-09-24 MED ORDER — FUROSEMIDE 10 MG/ML IJ SOLN
60.0000 mg | Freq: Once | INTRAMUSCULAR | Status: AC
  Administered 2022-09-24: 60 mg via INTRAVENOUS
  Filled 2022-09-24: qty 6

## 2022-09-24 MED ORDER — METOPROLOL TARTRATE 5 MG/5ML IV SOLN
5.0000 mg | Freq: Four times a day (QID) | INTRAVENOUS | Status: DC
  Administered 2022-09-24 (×2): 5 mg via INTRAVENOUS
  Filled 2022-09-24 (×2): qty 5

## 2022-09-24 MED ORDER — INSULIN GLARGINE-YFGN 100 UNIT/ML ~~LOC~~ SOLN
25.0000 [IU] | Freq: Two times a day (BID) | SUBCUTANEOUS | Status: DC
  Administered 2022-09-25 – 2022-09-28 (×7): 25 [IU] via SUBCUTANEOUS
  Filled 2022-09-24 (×8): qty 0.25

## 2022-09-24 MED ORDER — INSULIN GLARGINE-YFGN 100 UNIT/ML ~~LOC~~ SOLN: 15.0000 [IU] | Freq: Every day | SUBCUTANEOUS | Status: DC

## 2022-09-24 MED ORDER — HEPARIN (PORCINE) 25000 UT/250ML-% IV SOLN
1200.0000 [IU]/h | INTRAVENOUS | Status: DC
  Administered 2022-09-24: 1050 [IU]/h via INTRAVENOUS
  Administered 2022-09-26: 1200 [IU]/h via INTRAVENOUS
  Filled 2022-09-24 (×3): qty 250

## 2022-09-24 MED ORDER — INSULIN ASPART 100 UNIT/ML IJ SOLN
0.0000 [IU] | Freq: Three times a day (TID) | INTRAMUSCULAR | Status: DC
  Administered 2022-09-25: 15 [IU] via SUBCUTANEOUS
  Administered 2022-09-25 (×2): 7 [IU] via SUBCUTANEOUS
  Administered 2022-09-26 (×2): 4 [IU] via SUBCUTANEOUS
  Administered 2022-09-26 – 2022-09-27 (×3): 3 [IU] via SUBCUTANEOUS
  Administered 2022-09-27: 5 [IU] via SUBCUTANEOUS
  Administered 2022-09-28 – 2022-09-29 (×3): 3 [IU] via SUBCUTANEOUS

## 2022-09-24 MED ORDER — HEPARIN BOLUS VIA INFUSION
4000.0000 [IU] | Freq: Once | INTRAVENOUS | Status: AC
  Administered 2022-09-24: 4000 [IU] via INTRAVENOUS
  Filled 2022-09-24: qty 4000

## 2022-09-24 MED ORDER — INSULIN ASPART 100 UNIT/ML IJ SOLN
0.0000 [IU] | Freq: Every day | INTRAMUSCULAR | Status: DC
  Administered 2022-09-24: 4 [IU] via SUBCUTANEOUS

## 2022-09-24 MED ORDER — HALOPERIDOL LACTATE 5 MG/ML IJ SOLN
INTRAMUSCULAR | Status: AC
  Filled 2022-09-24: qty 1

## 2022-09-24 MED ORDER — SODIUM CHLORIDE 0.9 % IV SOLN
3.0000 g | Freq: Four times a day (QID) | INTRAVENOUS | Status: DC
  Administered 2022-09-24 – 2022-09-28 (×14): 3 g via INTRAVENOUS
  Filled 2022-09-24 (×17): qty 8

## 2022-09-24 MED ORDER — METHYLPREDNISOLONE SODIUM SUCC 40 MG IJ SOLR: 40.0000 mg | Freq: Two times a day (BID) | INTRAMUSCULAR | Status: DC

## 2022-09-24 MED ORDER — LEVALBUTEROL HCL 0.63 MG/3ML IN NEBU
0.6300 mg | INHALATION_SOLUTION | Freq: Four times a day (QID) | RESPIRATORY_TRACT | Status: DC
  Administered 2022-09-24 – 2022-09-25 (×5): 0.63 mg via RESPIRATORY_TRACT
  Filled 2022-09-24 (×5): qty 3

## 2022-09-24 MED ORDER — IPRATROPIUM BROMIDE 0.02 % IN SOLN
0.5000 mg | Freq: Four times a day (QID) | RESPIRATORY_TRACT | Status: DC
  Administered 2022-09-24 – 2022-09-25 (×5): 0.5 mg via RESPIRATORY_TRACT
  Filled 2022-09-24 (×5): qty 2.5

## 2022-09-24 MED ORDER — POTASSIUM PHOSPHATES 15 MMOLE/5ML IV SOLN
30.0000 mmol | Freq: Once | INTRAVENOUS | Status: AC
  Administered 2022-09-24: 30 mmol via INTRAVENOUS
  Filled 2022-09-24: qty 10

## 2022-09-24 MED ORDER — METHYLPREDNISOLONE SODIUM SUCC 125 MG IJ SOLR
80.0000 mg | Freq: Two times a day (BID) | INTRAMUSCULAR | Status: DC
  Administered 2022-09-25 – 2022-09-26 (×3): 80 mg via INTRAVENOUS
  Filled 2022-09-24 (×3): qty 2

## 2022-09-24 MED ORDER — INSULIN GLARGINE-YFGN 100 UNIT/ML ~~LOC~~ SOLN
15.0000 [IU] | Freq: Once | SUBCUTANEOUS | Status: AC
  Administered 2022-09-24: 15 [IU] via SUBCUTANEOUS
  Filled 2022-09-24: qty 0.15

## 2022-09-24 MED ORDER — POTASSIUM CHLORIDE IN NACL 20-0.9 MEQ/L-% IV SOLN
INTRAVENOUS | Status: DC
  Filled 2022-09-24: qty 1000

## 2022-09-24 MED ORDER — MELATONIN 5 MG PO TABS: 5.0000 mg | ORAL_TABLET | ORAL | Status: AC

## 2022-09-24 MED ORDER — METHYLPREDNISOLONE SODIUM SUCC 125 MG IJ SOLR
125.0000 mg | Freq: Once | INTRAMUSCULAR | Status: AC
  Administered 2022-09-24: 125 mg via INTRAVENOUS
  Filled 2022-09-24: qty 2

## 2022-09-24 MED ORDER — POTASSIUM CHLORIDE CRYS ER 20 MEQ PO TBCR: 20.0000 meq | EXTENDED_RELEASE_TABLET | Freq: Once | ORAL | Status: DC

## 2022-09-24 NOTE — Progress Notes (Signed)
Incontinent of large stool  mushy and brown in color. Abd became soft.bath given and linens changed. Started to be restless, hr-140's afib, resp. 39/min. Bp-164/94 . Unable to get pulse ox despite changing the probe, placed on the ear and fingers. Agitation started, placed on 100 % non - rebreathing mask. Rapid response , respiratory therapist and MD notified and came at once.  Lasix iv and metoprolol iv given.stat EKG chest xray   and abg done. Results seen by MD. PLaced on Bipap which pt attempted  to  pull it out multiple times. MD spoke with daughter and son. Latest Hr-120's, resp- 30's. . Became calmer and respiration is easy and regular.continue to monitor.

## 2022-09-24 NOTE — Significant Event (Signed)
Rapid Response Event Note   Reason for Call :  Desaturation, tachycardia  Initial Focused Assessment:  Patient agitated, attempting to pull off NRB and get out of bed. Increased work of breathing. Skin cool. Lungs rhonchus bilaterally.   VS: 164/94 (114) HR 142 RR 36 O2 83% NRB   Interventions:  O2 [place] added to NRB  CXR ABG RT and MD to bedside Metoprolol '5mg'$  IV Lasix '60mg'$  IV EKG Labs  Plan of Care:  Attempt bipap Daughter at bedside, son on way, discuss with MD when he arrives for further plan of care.   Event Summary:  MD Notified: T. Cyndia Skeeters MD Call Time: [place] Time: 1611 End Time: 1730  [place]man Nickels, RN

## 2022-09-24 NOTE — Progress Notes (Signed)
Pt woke up stating that he needed to get up to go urinate- tried to explain multiple times that he has a external cath on and he could just urinate- He continued to try to ger out of bed. Posey belt placed for patient's safety

## 2022-09-24 NOTE — Progress Notes (Signed)
ANTICOAGULATION CONSULT NOTE - Initial Consult  Pharmacy Consult for Heparin Indication: chest pain/ACS  Allergies  Allergen Reactions   Nsaids Other (See Comments)    H/o anemia   Citrus Hives   Donepezil     Irritable with '10mg'$  dose but can tolerate '5mg'$  per day   Lisinopril     Presumed cause of cough   Metformin And Related     GI upset.     Zocor [Simvastatin] Other (See Comments)    Myalgia     Patient Measurements: Height: '5\' 8"'$  (172.7 cm) Weight: 77.4 kg (170 lb 10.2 oz) IBW/kg (Calculated) : 68.4 Heparin Dosing Weight: 77 kg  Vital Signs: Temp: 99.2 F (37.3 C) (01/02 1813) Temp Source: Axillary (01/02 1813) BP: 134/91 (01/02 1813) Pulse Rate: 125 (01/02 1813)  Labs: Recent Labs    09/03/2022 2200 09/23/22 0425 09/24/22 0033 09/24/22 1650  HGB 15.2 13.7 14.0  --   HCT 42.1 37.6* 39.3  --   PLT 180 160 157  --   LABPROT  --  15.4*  --   --   INR  --  1.2  --   --   CREATININE 1.46* 1.33* 1.17  --   CKTOTAL 753* 1,001* 1,061*  --   TROPONINIHS  --   --   --  1,104*    Estimated Creatinine Clearance: 41.4 mL/min (by C-G formula based on SCr of 1.17 mg/dL).   Medical History: Past Medical History:  Diagnosis Date   Arthritis    Basal cell carcinoma of scalp 12/16/07   Reexcision (Dr. Merril Abbe. Teressa Senter)   BPH (benign prostatic hyperplasia)    CAD (coronary artery disease) 11/01   stent placed   Cataract    Diabetes mellitus type II 11/01   GERD (gastroesophageal reflux disease)    occasionally   Hearing aid worn    HOH (hard of hearing)    Hyperlipemia 11/01   Hypertension 11/01   Ruptured disc, cervical    Neck C7 (Dr. Pearlie Oyster)    Medications:  Scheduled:   aspirin EC  81 mg Oral QHS   finasteride  5 mg Oral Daily   guaiFENesin  600 mg Oral BID   insulin aspart  0-20 Units Subcutaneous TID WC   insulin aspart  0-5 Units Subcutaneous QHS   insulin glargine-yfgn  25 Units Subcutaneous Daily   levalbuterol  0.63 mg Nebulization Q6H    And   ipratropium  0.5 mg Nebulization Q6H   melatonin  5 mg Oral STAT   [START ON 09/25/2022] methylPREDNISolone (SOLU-MEDROL) injection  40 mg Intravenous Q12H   metoprolol tartrate  5 mg Intravenous Q6H   potassium chloride  20 mEq Oral Once   tamsulosin  0.4 mg Oral QHS   Infusions:   ampicillin-sulbactam (UNASYN) IV      Assessment: 89 y.o. M presents s/p fall. Trop 1104. To begin heparin for ACS. No AC PTA and no VTE prophylaxis currently. CBC stable.  Goal of Therapy:  Heparin level 0.3-0.7 units/ml Monitor platelets by anticoagulation protocol: Yes   Plan:  Heparin IV bolus 4000 units Heparin gtt at 1050 units/hr Will f/u heparin level in 8 hours Daily heparin level and CBC F/u goals of care   Sherlon Handing, PharmD, BCPS Please see amion for complete clinical pharmacist phone list 09/24/2022,6:39 PM

## 2022-09-24 NOTE — Progress Notes (Signed)
  Transition of Care [facility]) Screening Note   Patient Details  Name: Paul Terrell Date of Birth: [birth year]   Transition of Care [facility]) CM/SW Contact:    Bartholomew Crews, RN Phone Number: [phone] 09/24/2022, 8:28 AM    Transition of Care Department [facility]) has reviewed patient and no TOC needs have been identified at this time. We will continue to monitor patient advancement through interdisciplinary progression rounds. If new patient transition needs arise, please place a TOC consult.

## 2022-09-24 NOTE — Progress Notes (Signed)
PT Cancellation Note  Patient Details Name: ZAIR BORAWSKI MRN: [id number] DOB: 12/08/1930   Cancelled Treatment:    Reason Eval/Treat Not Completed: Other (comment) Pt sleeping soundly; attempted to arouse, but quickly fell back asleep.  Wyona Almas, PT, DPT Acute Rehabilitation Services Office [place] 09/24/2022, 10:26 AM

## 2022-09-24 NOTE — Progress Notes (Signed)
Attempted to get out of bed, multiple times. explained to him to stay in bed to avoid falls.   Very hard of hearing , hearing aid not available, called son and ask to bring the hearing aid and made them aware about the confusion and restlessness. MD made aware with order for EKG done to check on QTC prior to give  haldol. Daughter came tried to talk him not to get out of bed. Haldol 2 mg iv given. Communicate with him in writing.  Continue to monitor.

## 2022-09-24 NOTE — Progress Notes (Addendum)
PROGRESS NOTE  Paul Terrell IFO:[id number] DOB: 08/07/1931   PCP: Tonia Ghent, MD  Patient is from: Home.   DOA: 09/10/2022 LOS: 1  Chief complaints Chief Complaint  Patient presents with   Fall     Brief Narrative / Interim history: 89 year old M with PMH of CAD, poorly controlled DM-2, HLD, HTN, BPH and hard of hearing presenting with generalized weakness and multiple ground-level falls at home, and admitted for sepsis and acute respiratory failure with hypoxia in the setting of RSV infection, AKI, acute metabolic encephalopathy and mild rhabdomyolysis. Cr 1.33.  CK elevated to 1000.  CT head without acute finding.  CXR revealed stable cardiomegaly with mild left basilar linear atelectasis.  RSV positive.  Developed acute respiratory distress with acute respiratory failure with hypoxia and hypercapnia, acute respiratory acidosis and tachycardia  Subjective: Seen and examined earlier this morning and later this afternoon.  Patient was confused and agitated trying to climb out of the bed overnight and earlier this morning.  He developed  Objective: Vitals:   09/24/22 1143 09/24/22 1200 09/24/22 1615 09/24/22 1630  BP: (!) 140/81 137/72 (!) 164/94 (!) 138/100  Pulse: 92 92 (!) 139   Resp: (!) 26 (!) 26 (!) 32 (!) 35  Temp: 98.2 F (36.8 C)     TempSrc: Oral     SpO2: 93% 96% (!) 89%   Weight:      Height:        Examination:  GENERAL: Somewhat restless and dyspneic. HEENT: MMM.  Vision and hearing grossly intact.  NECK: Supple.  No apparent JVD.  RESP: Increased RR.  Some WOB.  Rhonchorous. CVS: Tachycardic to 140s.  Regular rhythm.  Heart sounds normal.  ABD/GI/GU: BS+. Abd soft, NTND.  MSK/EXT:  Moves extremities. No apparent deformity. No edema.  SKIN: no apparent skin lesion or wound NEURO: Awake.  Oriented to self.  Follows some commands.  Moves all extremities.  No apparent focal neuro deficit. PSYCH: Somewhat restless.  Procedures:  None  Microbiology  summarized: Blood cultures NGTD.  Assessment and plan: Principal Problem:   Acute respiratory failure with hypoxia and hypercapnia (HCC) Active Problems:   DM2 (diabetes mellitus, type 2) (HCC)   Hyperlipidemia   CAD (coronary artery disease)   BPH (benign prostatic hyperplasia)   Essential hypertension   Acute metabolic encephalopathy   RSV (respiratory syncytial virus infection)   Generalized weakness   Fall at home, initial encounter   AKI (acute kidney injury) ([place])   Hypokalemia   Severe sepsis ([place])   Sinus tachycardia   Delirium  Acute hypoxic respiratory failure with hypoxia and hypercapnia: In the setting of RSV infection.  No history of COPD.  Daughter reports history of asthma.  Does not seem to be on inhalers or nebulizers at home.  Acute respiratory distress the afternoon of 09/24/2022.  ABG 7.14/62/53/21.  Patient is DNR/DNI.  CXR with left-sided reticular infiltrate but formal read pending. -IV Solu-Medrol 125 mg x 1 -Xopenex and Atrovent every 6 hours -Start IV Unasyn -Trial of BiPAP after discussion with patient's daughter.  NPO. -IV Lasix 60 mg x 1 -Check BNP, troponin and EKG  Severe sepsis due to RSV infection and possible aspiration pneumonia -Management as above.  Sinus tachycardia: HR in 140s and briefly improved to 120s after IV metoprolol.  Blood pressure normal. -IV metoprolol 5 mg x 1  Acute encephalopathy/delirium: CT head without acute finding.  No focal neurologic deficit.  Awake and oriented to self.  Follows some  commands. -Reorientation and delirium precautions -IV Haldol as needed.  Optimize electrolytes.   Generalized weakness: -PT/OT   Acute kidney injury: Resolved. Recent Labs    11/15/21 0849 05/06/22 0903 09/06/22 0839 09/07/2022 2200 09/23/22 0425 09/24/22 0033  BUN '16 18 17 20 20 21  '$ CREATININE 1.20 1.12 1.13 1.46* 1.33* 1.17  -Monitor    IDDM-2 with hyperglycemia: A1c 8.0% on 09/06/2022 Recent Labs  Lab 09/23/22 1153  09/23/22 1651 09/23/22 2349 09/24/22 0642 09/24/22 1146  GLUCAP 180* 171* 204* 199* 223*  -Increase SSI from sensitive to resistant while on steroid -Continue Semglee 25 units daily. -Further adjustment as appropriate   BPH: Without LUTS -Continue current meds.   Essential hypertension: -Continue metoprolol. -IV Lasix as above   Hyperlipidemia: Hold statin with elevated CK. -Monitor CPK.  Mild rhabdomyolysis: -Continue monitoring  Body mass index is 25.95 kg/m.          DVT prophylaxis:  SCDs Start: 09/23/22 0224  Code Status: DNR/DNI Family Communication: Updated patient's daughter outside patient's room. Level of care: Progressive Status is: Inpatient Remains inpatient appropriate because: Acute respiratory failure, delirium/encephalopathy   Final disposition: TBD Consultants:  None  Sch Meds:  Scheduled Meds:  aspirin EC  81 mg Oral QHS   finasteride  5 mg Oral Daily   guaiFENesin  600 mg Oral BID   insulin aspart  0-20 Units Subcutaneous TID WC   insulin aspart  0-5 Units Subcutaneous QHS   insulin glargine-yfgn  25 Units Subcutaneous Daily   levalbuterol  0.63 mg Nebulization Q6H   And   ipratropium  0.5 mg Nebulization Q6H   melatonin  5 mg Oral STAT   [START ON 09/25/2022] methylPREDNISolone (SOLU-MEDROL) injection  40 mg Intravenous Q12H   metoprolol succinate  50 mg Oral Daily   metoprolol tartrate  5 mg Intravenous Q6H   potassium chloride  20 mEq Oral Once   tamsulosin  0.4 mg Oral QHS   Continuous Infusions:  ampicillin-sulbactam (UNASYN) IV     PRN Meds:.acetaminophen **OR** acetaminophen, benzonatate, haloperidol lactate, melatonin  Antimicrobials: Anti-infectives (From admission, onward)    Start     Dose/Rate Route Frequency Ordered Stop   09/24/22 1800  Ampicillin-Sulbactam (UNASYN) 3 g in sodium chloride 0.9 % 100 mL IVPB        3 g 200 mL/hr over 30 Minutes Intravenous Every 6 hours 09/24/22 1658          I have  personally reviewed the following labs and images: CBC: Recent Labs  Lab 08/27/2022 2200 09/23/22 0425 09/24/22 0033  WBC 12.7* 12.5* 15.1*  NEUTROABS 10.3* 8.7* 11.8*  HGB 15.2 13.7 14.0  HCT 42.1 37.6* 39.3  MCV 93.3 93.8 94.7  PLT 180 160 157   BMP &GFR Recent Labs  Lab 08/29/2022 2200 09/23/22 0425 09/24/22 0033  NA 133* 133* 135  K 3.4* 3.4* 3.4*  CL 93* 94* 98  CO2 '27 28 25  '$ GLUCOSE 317* 244* 211*  BUN '20 20 21  '$ CREATININE 1.46* 1.33* 1.17  CALCIUM 8.6* 8.1* 8.5*  MG  --  1.7 1.8  PHOS  --  2.5 1.5*   Estimated Creatinine Clearance: 41.4 mL/min (by C-G formula based on SCr of 1.17 mg/dL). Liver & Pancreas: Recent Labs  Lab 09/23/22 0425 09/24/22 0033  AST 71*  --   ALT 41  --   ALKPHOS 50  --   BILITOT 1.2  --   PROT 5.9*  --   ALBUMIN 3.0* 2.9*  No results for input(s): "LIPASE", "AMYLASE" in the last 168 hours. No results for input(s): "AMMONIA" in the last 168 hours. Diabetic: No results for input(s): "HGBA1C" in the last 72 hours. Recent Labs  Lab 09/23/22 1153 09/23/22 1651 09/23/22 2349 09/24/22 0642 09/24/22 1146  GLUCAP 180* 171* 204* 199* 223*   Cardiac Enzymes: Recent Labs  Lab 09/12/2022 2200 09/23/22 0425 09/24/22 0033  CKTOTAL 753* 1,001* 1,061*   No results for input(s): "PROBNP" in the last 8760 hours. Coagulation Profile: Recent Labs  Lab 09/23/22 0425  INR 1.2   Thyroid Function Tests: Recent Labs    09/23/22 0425  TSH 0.710   Lipid Profile: No results for input(s): "CHOL", "HDL", "LDLCALC", "TRIG", "CHOLHDL", "LDLDIRECT" in the last 72 hours. Anemia Panel: No results for input(s): "VITAMINB12", "FOLATE", "FERRITIN", "TIBC", "IRON", "RETICCTPCT" in the last 72 hours. Urine analysis:    Component Value Date/Time   COLORURINE YELLOW 09/23/2022 0055   APPEARANCEUR HAZY (A) 09/23/2022 0055   LABSPEC 1.018 09/23/2022 0055   PHURINE 5.0 09/23/2022 0055   GLUCOSEU >=500 (A) 09/23/2022 0055   HGBUR SMALL (A)  09/23/2022 0055   BILIRUBINUR NEGATIVE 09/23/2022 0055   KETONESUR 5 (A) 09/23/2022 0055   PROTEINUR 100 (A) 09/23/2022 0055   NITRITE NEGATIVE 09/23/2022 0055   LEUKOCYTESUR NEGATIVE 09/23/2022 0055   Sepsis Labs: Invalid input(s): "PROCALCITONIN", "LACTICIDVEN"  Microbiology: Recent Results (from the past 240 hour(s))  Resp panel by RT-PCR (RSV, Flu A&B, Covid) Anterior Nasal Swab     Status: Abnormal   Collection Time: 09/21/22 10:56 AM   Specimen: Anterior Nasal Swab  Result Value Ref Range Status   SARS Coronavirus 2 by RT PCR NEGATIVE NEGATIVE Final    Comment: (NOTE) SARS-CoV-2 target nucleic acids are NOT DETECTED.  The SARS-CoV-2 RNA is generally detectable in upper respiratory specimens during the acute phase of infection. The lowest concentration of SARS-CoV-2 viral copies this assay can detect is 138 copies/mL. A negative result does not preclude SARS-Cov-2 infection and should not be used as the sole basis for treatment or other patient management decisions. A negative result may occur with  improper specimen collection/handling, submission of specimen other than nasopharyngeal swab, presence of viral mutation(s) within the areas targeted by this assay, and inadequate number of viral copies(<138 copies/mL). A negative result must be combined with clinical observations, patient history, and epidemiological information. The expected result is Negative.  Fact Sheet for Patients:  EntrepreneurPulse.com.au  Fact Sheet for Healthcare Providers:  IncredibleEmployment.be  This test is no t yet approved or cleared by the [place] FDA and  has been authorized for detection and/or diagnosis of SARS-CoV-2 by FDA under an Emergency Use Authorization (EUA). This EUA will remain  in effect (meaning this test can be used) for the duration of the COVID-19 declaration under Section 564(b)(1) of the Act, 21 U.S.C.section 360bbb-3(b)(1),  unless the authorization is terminated  or revoked sooner.       Influenza A by PCR NEGATIVE NEGATIVE Final   Influenza B by PCR NEGATIVE NEGATIVE Final    Comment: (NOTE) The Xpert Xpress SARS-CoV-2/FLU/RSV plus assay is intended as an aid in the diagnosis of influenza from Nasopharyngeal swab specimens and should not be used as a sole basis for treatment. Nasal washings and aspirates are unacceptable for Xpert Xpress SARS-CoV-2/FLU/RSV testing.  Fact Sheet for Patients: EntrepreneurPulse.com.au  Fact Sheet for Healthcare Providers: IncredibleEmployment.be  This test is not yet approved or cleared by the [place] and has been authorized  for detection and/or diagnosis of SARS-CoV-2 by FDA under an Emergency Use Authorization (EUA). This EUA will remain in effect (meaning this test can be used) for the duration of the COVID-19 declaration under Section 564(b)(1) of the Act, 21 U.S.C. section 360bbb-3(b)(1), unless the authorization is terminated or revoked.     Resp Syncytial Virus by PCR POSITIVE (A) NEGATIVE Final    Comment: (NOTE) Fact Sheet for Patients: EntrepreneurPulse.com.au  Fact Sheet for Healthcare Providers: IncredibleEmployment.be  This test is not yet approved or cleared by the [place] FDA and has been authorized for detection and/or diagnosis of SARS-CoV-2 by FDA under an Emergency Use Authorization (EUA). This EUA will remain in effect (meaning this test can be used) for the duration of the COVID-19 declaration under Section 564(b)(1) of the Act, 21 U.S.C. section 360bbb-3(b)(1), unless the authorization is terminated or revoked.  Performed at [facility], [address] [place], [place] [address]   Blood culture (routine x 2)     Status: None (Preliminary result)   Collection Time: 09/11/2022  9:34 PM   Specimen: BLOOD  Result Value Ref Range  Status   Specimen Description BLOOD LEFT ANTECUBITAL  Final   Special Requests   Final    BOTTLES DRAWN AEROBIC AND ANAEROBIC Blood Culture results may not be optimal due to an excessive volume of blood received in culture bottles   Culture   Final    NO GROWTH 2 DAYS Performed at [facility] [address], [place], [place] [address]    Report Status PENDING  Incomplete  Culture, blood (Routine X 2) w Reflex to ID Panel     Status: None (Preliminary result)   Collection Time: 09/23/22  5:30 PM   Specimen: BLOOD  Result Value Ref Range Status   Specimen Description BLOOD BLOOD LEFT FOREARM  Final   Special Requests   Final    BOTTLES DRAWN AEROBIC AND ANAEROBIC Blood Culture results may not be optimal due to an excessive volume of blood received in culture bottles   Culture   Final    NO GROWTH < 24 HOURS Performed at [facility], [place] [address], [place], [place] [address]    Report Status PENDING  Incomplete  Culture, blood (Routine X 2) w Reflex to ID Panel     Status: None (Preliminary result)   Collection Time: 09/23/22  5:56 PM   Specimen: BLOOD  Result Value Ref Range Status   Specimen Description BLOOD LEFT ANTECUBITAL  Final   Special Requests   Final    BOTTLES DRAWN AEROBIC AND ANAEROBIC Blood Culture results may not be optimal due to an inadequate volume of blood received in culture bottles   Culture   Final    NO GROWTH < 24 HOURS Performed at [facility], [place] [address], [place], [place] [address]    Report Status PENDING  Incomplete  MRSA Next Gen by PCR, Nasal     Status: None   Collection Time: 09/23/22 11:43 PM   Specimen: Nasal Mucosa; Nasal Swab  Result Value Ref Range Status   MRSA by PCR Next Gen NOT DETECTED NOT DETECTED Final    Comment: (NOTE) The GeneXpert MRSA Assay (FDA approved for NASAL specimens only), is one component of a comprehensive MRSA colonization surveillance program. It is not intended to diagnose MRSA infection  nor to guide or monitor treatment for MRSA infections. Test performance is not FDA approved in patients less than 2 years  old. Performed at [facility], [place] [address], [place], [place] [address]     Radiology Studies: No results found.  CRITICAL CARE Performed by: [facility]   Total critical care time: 55 minutes  Critical care time was exclusive of separately billable procedures and treating other patients.  Critical care was necessary to treat or prevent imminent or life-threatening deterioration.  Critical care was time spent personally by me on the following activities: development of treatment plan with patient and/or surrogate as well as nursing, discussions with consultants, evaluation of patient's response to treatment, examination of patient, obtaining history from patient or surrogate, ordering and performing treatments and interventions, ordering and review of laboratory studies, ordering and review of radiographic studies, pulse oximetry and re-evaluation of patient's condition.  Glyn Zendejas [facility]  If 7PM-7AM, please contact night-coverage [email or web address] 09/24/2022, 5:07 PM

## 2022-09-24 NOTE — Progress Notes (Signed)
   09/24/22 1615  Assess: MEWS Score  BP (!) 164/94  MAP (mmHg) 114  Pulse Rate (!) 139  ECG Heart Rate (!) 142  Resp (!) 32  SpO2 (!) 89 %  O2 Device Non-rebreather Mask  Patient Activity (if Appropriate) In bed  Assess: MEWS Score  MEWS Temp 0  MEWS Systolic 0  MEWS Pulse 3  MEWS RR 2  MEWS LOC 0  MEWS Score 5  MEWS Score Color Red  Assess: if the MEWS score is Yellow or Red  Were vital signs taken at a resting state? Yes  Focused Assessment Change from prior assessment (see assessment flowsheet)  Does the patient meet 2 or more of the SIRS criteria? No  MEWS guidelines implemented *See Row Information* Yes  Take Vital Signs  Increase Vital Sign Frequency  Red: Q 1hr X 4 then Q 4hr X 4, if remains red, continue Q 4hrs  Escalate  MEWS: Escalate Red: discuss with charge nurse/RN and provider, consider discussing with RRT  Notify: Charge Nurse/RN  Name of Charge Nurse/RN Notified creshenda  Date Charge Nurse/RN Notified 09/24/22  Time Charge Nurse/RN Notified 1630  Provider Notification  Provider Name/Title Dr. Quinn Plowman  Date Provider Notified 09/24/22  Time Provider Notified 1620  Method of Notification Page  Notification Reason Change in status  Provider response En route  Date of Provider Response 09/24/22  Time of Provider Response 1630  Notify: Rapid Response  Name of Rapid Response RN Notified Orvil Feil Rn  Date Rapid Response Notified 09/24/22  Time Rapid Response Notified 1610  Document  Patient Outcome Stabilized after interventions  Progress note created (see row info) Yes  Assess: SIRS CRITERIA  SIRS Temperature  0  SIRS Pulse 1  SIRS Respirations  1  SIRS WBC 0  SIRS Score Sum  2

## 2022-09-24 NOTE — Progress Notes (Signed)
Met with patient's daughter, Paul Terrell and son, Paul Terrell and discussed all of care.  Patient is already DNR/DNI.  Now with acute respiratory failure with hypoxia and hypercapnia requiring BiPAP.  CXR concerning for pulmonary edema.  EKG sinus tachycardia with PAC.  ABG 7.12/62/53/22 suggesting acute respiratory acidosis.  BNP 490.  Troponin in process.  Patient received IV Solu-Medrol, IV Lasix and started on BiPAP, IV Unasyn and nebulizers.  He is currently awake but confused.  He is tolerating BiPAP so far.  Discussed about treatment plan which includes continuing current scope of care or emphasis on comfort.  Family likes to continue current care for the next 2 to 3 hours.  If patient declines, family open to full comfort care.  Family to notify nursing staff and overnight providers if they decides to transition to full comfort care.   

## 2022-09-25 ENCOUNTER — Inpatient Hospital Stay (HOSPITAL_COMMUNITY): Payer: Medicare Other

## 2022-09-25 DIAGNOSIS — J189 Pneumonia, unspecified organism: Secondary | ICD-10-CM | POA: Insufficient documentation

## 2022-09-25 DIAGNOSIS — N179 Acute kidney failure, unspecified: Secondary | ICD-10-CM | POA: Diagnosis not present

## 2022-09-25 DIAGNOSIS — I5043 Acute on chronic combined systolic (congestive) and diastolic (congestive) heart failure: Secondary | ICD-10-CM | POA: Diagnosis not present

## 2022-09-25 DIAGNOSIS — J9601 Acute respiratory failure with hypoxia: Secondary | ICD-10-CM | POA: Diagnosis not present

## 2022-09-25 DIAGNOSIS — R7989 Other specified abnormal findings of blood chemistry: Secondary | ICD-10-CM

## 2022-09-25 DIAGNOSIS — G9341 Metabolic encephalopathy: Secondary | ICD-10-CM | POA: Diagnosis not present

## 2022-09-25 DIAGNOSIS — I25118 Atherosclerotic heart disease of native coronary artery with other forms of angina pectoris: Secondary | ICD-10-CM

## 2022-09-25 DIAGNOSIS — J9602 Acute respiratory failure with hypercapnia: Secondary | ICD-10-CM

## 2022-09-25 DIAGNOSIS — I214 Non-ST elevation (NSTEMI) myocardial infarction: Secondary | ICD-10-CM | POA: Insufficient documentation

## 2022-09-25 DIAGNOSIS — R000 Tachycardia, unspecified: Secondary | ICD-10-CM

## 2022-09-25 DIAGNOSIS — B338 Other specified viral diseases: Secondary | ICD-10-CM | POA: Diagnosis not present

## 2022-09-25 LAB — COMPREHENSIVE METABOLIC PANEL
ALT: 97 U/L — ABNORMAL HIGH (ref 0–44)
AST: 116 U/L — ABNORMAL HIGH (ref 15–41)
Albumin: 2.6 g/dL — ABNORMAL LOW (ref 3.5–5.0)
Alkaline Phosphatase: 78 U/L (ref 38–126)
Anion gap: 14 (ref 5–15)
BUN: 34 mg/dL — ABNORMAL HIGH (ref 8–23)
CO2: 20 mmol/L — ABNORMAL LOW (ref 22–32)
Calcium: 8.3 mg/dL — ABNORMAL LOW (ref 8.9–10.3)
Chloride: 100 mmol/L (ref 98–111)
Creatinine, Ser: 1.23 mg/dL (ref 0.61–1.24)
GFR, Estimated: 56 mL/min — ABNORMAL LOW (ref >60)
Glucose, Bld: 346 mg/dL — ABNORMAL HIGH (ref 70–99)
Potassium: 3.9 mmol/L (ref 3.5–5.1)
Sodium: 134 mmol/L — ABNORMAL LOW (ref 135–145)
Total Bilirubin: 1 mg/dL (ref 0.3–1.2)
Total Protein: 6.2 g/dL — ABNORMAL LOW (ref 6.5–8.1)

## 2022-09-25 LAB — ECHOCARDIOGRAM COMPLETE
Area-P 1/2: 4.57 cm2
Calc EF: 44.7 %
Height: 68 in
S' Lateral: 3.4 cm
Single Plane A2C EF: 31.6 %
Single Plane A4C EF: 46.1 %
Weight: 2730.18 oz

## 2022-09-25 LAB — BLOOD GAS, VENOUS
Acid-base deficit: 2.3 mmol/L — ABNORMAL HIGH (ref 0.0–2.0)
Bicarbonate: 21.2 mmol/L (ref 20.0–28.0)
O2 Saturation: 96.7 %
Patient temperature: 36.6
pCO2, Ven: 31 mmHg — ABNORMAL LOW (ref 44–60)
pH, Ven: 7.44 — ABNORMAL HIGH (ref 7.25–7.43)
pO2, Ven: 69 mmHg — ABNORMAL HIGH (ref 32–45)

## 2022-09-25 LAB — CBC WITH DIFFERENTIAL/PLATELET
Abs Immature Granulocytes: 0.08 10*3/uL — ABNORMAL HIGH (ref 0.00–0.07)
Basophils Absolute: 0 10*3/uL (ref 0.0–0.1)
Basophils Relative: 0 %
Eosinophils Absolute: 0 10*3/uL (ref 0.0–0.5)
Eosinophils Relative: 0 %
HCT: 40.5 % (ref 39.0–52.0)
Hemoglobin: 14.7 g/dL (ref 13.0–17.0)
Immature Granulocytes: 1 %
Lymphocytes Relative: 4 %
Lymphs Abs: 0.7 10*3/uL (ref 0.7–4.0)
MCH: 33.8 pg (ref 26.0–34.0)
MCHC: 36.3 g/dL — ABNORMAL HIGH (ref 30.0–36.0)
MCV: 93.1 fL (ref 80.0–100.0)
Monocytes Absolute: 0.3 10*3/uL (ref 0.1–1.0)
Monocytes Relative: 2 %
Neutro Abs: 14.4 10*3/uL — ABNORMAL HIGH (ref 1.7–7.7)
Neutrophils Relative %: 93 %
Platelets: 180 10*3/uL (ref 150–400)
RBC: 4.35 MIL/uL (ref 4.22–5.81)
RDW: 13.6 % (ref 11.5–15.5)
WBC: 15.4 10*3/uL — ABNORMAL HIGH (ref 4.0–10.5)
nRBC: 0 % (ref 0.0–0.2)

## 2022-09-25 LAB — HEPARIN LEVEL (UNFRACTIONATED)
Heparin Unfractionated: 0.36 IU/mL (ref 0.30–0.70)
Heparin Unfractionated: 0.26 IU/mL — ABNORMAL LOW (ref 0.30–0.70)
Heparin Unfractionated: 0.4 IU/mL (ref 0.30–0.70)

## 2022-09-25 LAB — MAGNESIUM: Magnesium: 1.8 mg/dL (ref 1.7–2.4)

## 2022-09-25 LAB — AMMONIA: Ammonia: 26 umol/L (ref 9–35)

## 2022-09-25 LAB — GLUCOSE, CAPILLARY
Glucose-Capillary: 328 mg/dL — ABNORMAL HIGH (ref 70–99)
Glucose-Capillary: 249 mg/dL — ABNORMAL HIGH (ref 70–99)
Glucose-Capillary: 205 mg/dL — ABNORMAL HIGH (ref 70–99)
Glucose-Capillary: 134 mg/dL — ABNORMAL HIGH (ref 70–99)

## 2022-09-25 LAB — PROCALCITONIN: Procalcitonin: 3.06 ng/mL

## 2022-09-25 LAB — BRAIN NATRIURETIC PEPTIDE: B Natriuretic Peptide: 1213.7 pg/mL — ABNORMAL HIGH (ref 0.0–100.0)

## 2022-09-25 LAB — PHOSPHORUS: Phosphorus: 3.6 mg/dL (ref 2.5–4.6)

## 2022-09-25 LAB — CK: Total CK: 504 U/L — ABNORMAL HIGH (ref 49–397)

## 2022-09-25 MED ORDER — IPRATROPIUM BROMIDE 0.02 % IN SOLN
0.5000 mg | Freq: Three times a day (TID) | RESPIRATORY_TRACT | Status: DC
  Administered 2022-09-26 – 2022-09-28 (×8): 0.5 mg via RESPIRATORY_TRACT
  Filled 2022-09-25 (×9): qty 2.5

## 2022-09-25 MED ORDER — BREXPIPRAZOLE 0.25 MG PO TABS
0.5000 mg | ORAL_TABLET | Freq: Every day | ORAL | Status: DC
  Administered 2022-09-25 – 2022-09-28 (×4): 0.5 mg via ORAL
  Filled 2022-09-25 (×4): qty 2

## 2022-09-25 MED ORDER — INSULIN ASPART 100 UNIT/ML IJ SOLN
5.0000 [IU] | Freq: Three times a day (TID) | INTRAMUSCULAR | Status: DC
  Administered 2022-09-25: 5 [IU] via SUBCUTANEOUS

## 2022-09-25 MED ORDER — HEPARIN BOLUS VIA INFUSION
1150.0000 [IU] | Freq: Once | INTRAVENOUS | Status: AC
  Administered 2022-09-25: 1150 [IU] via INTRAVENOUS
  Filled 2022-09-25: qty 1150

## 2022-09-25 MED ORDER — SODIUM CHLORIDE 0.9 % IV SOLN
500.0000 mg | INTRAVENOUS | Status: AC
  Administered 2022-09-25 – 2022-09-27 (×3): 500 mg via INTRAVENOUS
  Filled 2022-09-25 (×3): qty 5

## 2022-09-25 MED ORDER — PERFLUTREN LIPID MICROSPHERE
1.0000 mL | INTRAVENOUS | Status: AC | PRN
  Administered 2022-09-25: 2 mL via INTRAVENOUS

## 2022-09-25 MED ORDER — IPRATROPIUM BROMIDE 0.02 % IN SOLN: 0.5000 mg | Freq: Four times a day (QID) | RESPIRATORY_TRACT | Status: DC

## 2022-09-25 MED ORDER — METOPROLOL TARTRATE 50 MG PO TABS
50.0000 mg | ORAL_TABLET | Freq: Two times a day (BID) | ORAL | Status: DC
  Administered 2022-09-25 – 2022-09-27 (×5): 50 mg via ORAL
  Filled 2022-09-25 (×4): qty 1

## 2022-09-25 MED ORDER — FUROSEMIDE 10 MG/ML IJ SOLN
60.0000 mg | Freq: Two times a day (BID) | INTRAMUSCULAR | Status: DC
  Administered 2022-09-25 (×2): 60 mg via INTRAVENOUS
  Filled 2022-09-25 (×2): qty 6

## 2022-09-25 MED ORDER — LEVALBUTEROL HCL 0.63 MG/3ML IN NEBU
0.6300 mg | INHALATION_SOLUTION | Freq: Three times a day (TID) | RESPIRATORY_TRACT | Status: DC
  Administered 2022-09-26 – 2022-09-28 (×8): 0.63 mg via RESPIRATORY_TRACT
  Filled 2022-09-25 (×9): qty 3

## 2022-09-25 MED ORDER — HALOPERIDOL LACTATE 5 MG/ML IJ SOLN
2.0000 mg | Freq: Once | INTRAMUSCULAR | Status: AC
  Administered 2022-09-25: 2 mg via INTRAVENOUS

## 2022-09-25 MED ORDER — LEVALBUTEROL HCL 0.63 MG/3ML IN NEBU: 0.6300 mg | INHALATION_SOLUTION | Freq: Three times a day (TID) | RESPIRATORY_TRACT | Status: DC

## 2022-09-25 MED ORDER — FUROSEMIDE 10 MG/ML IJ SOLN: 40.0000 mg | Freq: Two times a day (BID) | INTRAMUSCULAR | Status: DC

## 2022-09-25 NOTE — Progress Notes (Signed)
  Echocardiogram 2D Echocardiogram has been performed.  Paul Terrell 09/25/2022, 10:56 AM

## 2022-09-25 NOTE — Progress Notes (Signed)
[facility] for Heparin Indication: chest pain/ACS  Allergies  Allergen Reactions   Nsaids Other (See Comments)    H/o anemia   Citrus Hives   Donepezil     Irritable with '10mg'$  dose but can tolerate '5mg'$  per day   Lisinopril     Presumed cause of cough   Metformin And Related     GI upset.     Zocor [Simvastatin] Other (See Comments)    Myalgia     Patient Measurements: Height: '5\' 8"'$  (172.7 cm) Weight: 77.4 kg (170 lb 10.2 oz) IBW/kg (Calculated) : 68.4 Heparin Dosing Weight: 77 kg  Vital Signs: Temp: 97.7 F (36.5 C) (01/03 1117) Temp Source: Oral (01/03 1117) BP: 124/75 (01/03 1117) Pulse Rate: 76 (01/03 1117)  Labs: Recent Labs    09/23/22 0425 09/24/22 0033 09/24/22 1650 09/24/22 1811 09/25/22 0256 09/25/22 1224  HGB 13.7 14.0  --   --  14.7  --   HCT 37.6* 39.3  --   --  40.5  --   PLT 160 157  --   --  180  --   LABPROT 15.4*  --   --   --   --   --   INR 1.2  --   --   --   --   --   HEPARINUNFRC  --   --   --   --  0.36 0.26*  CREATININE 1.33* 1.17  --   --  1.23  --   CKTOTAL 1,001* 1,061*  --   --  504*  --   TROPONINIHS  --   --  1,104* 891*  --   --     Estimated Creatinine Clearance: 39.4 mL/min (by C-G formula based on SCr of 1.23 mg/dL).   Medical History: Past Medical History:  Diagnosis Date   Arthritis    Basal cell carcinoma of scalp 12/16/07   Reexcision (Dr. Merril Abbe. Teressa Senter)   BPH (benign prostatic hyperplasia)    CAD (coronary artery disease) 11/01   stent placed   Cataract    Diabetes mellitus type II 11/01   GERD (gastroesophageal reflux disease)    occasionally   Hearing aid worn    HOH (hard of hearing)    Hyperlipemia 11/01   Hypertension 11/01   Ruptured disc, cervical    Neck C7 (Dr. Pearlie Oyster)    Medications:  Scheduled:   aspirin EC  81 mg Oral QHS   finasteride  5 mg Oral Daily   furosemide  60 mg Intravenous BID   guaiFENesin  600 mg Oral BID   insulin aspart   0-20 Units Subcutaneous TID WC   insulin aspart  0-5 Units Subcutaneous QHS   insulin glargine-yfgn  25 Units Subcutaneous BID   levalbuterol  0.63 mg Nebulization Q6H   And   ipratropium  0.5 mg Nebulization Q6H   methylPREDNISolone (SOLU-MEDROL) injection  80 mg Intravenous Q12H   metoprolol tartrate  50 mg Oral BID   potassium chloride  20 mEq Oral Once   tamsulosin  0.4 mg Oral QHS   Infusions:   ampicillin-sulbactam (UNASYN) IV 3 g (09/25/22 1140)   heparin 1,050 Units/hr (09/25/22 0702)    Assessment upon initiating Heparin infusion: 89 y.o. M presents s/p fall. Trop 1104. To begin heparin for ACS. No AC PTA and no VTE prophylaxis currently. CBC stable.  1/3 PM update:  HL: 0.26- subtherapeutic No issues with the infusion (occlusions, pauses, or disconnections/bag running  dry) noted from nursing No signs of bleeding  Goal of Therapy:  Heparin level 0.3-0.7 units/ml Monitor platelets by anticoagulation protocol: Yes   Plan:  Give an 1150 unit bolus from the heparin infusion bag f/b increasing heparin infusion to 1200 units/hr Obtain a heparin level at 2130 (09/25/2022) Monitor daily heparin level and CBC Monitor for signs of bleeding and any issues with the heparin infusion  Thank you for including Pharmacy in the care of this patient.    Vaughan Basta BS, PharmD, BCPS Clinical Pharmacist 09/25/2022 1:14 PM  Contact: [phone] after 3 PM  "Be curious, not judgmental..." -Jamal Maes

## 2022-09-25 NOTE — Progress Notes (Signed)
PT Cancellation Note  Patient Details Name: Paul Terrell MRN: [id number] DOB: 1931-02-06   Cancelled Treatment:    Reason Eval/Treat Not Completed: Medical issues which prohibited therapy Discussed with RN; pt still with increased work of breathing at rest.  Wyona Almas, PT, DPT [facility] 09/25/2022, 2:47 PM
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
## 2022-09-25 NOTE — Progress Notes (Signed)
[place] for Heparin Indication: chest pain/ACS  Allergies  Allergen Reactions   Nsaids Other (See Comments)    H/o anemia   Citrus Hives   Donepezil     Irritable with '10mg'$  dose but can tolerate '5mg'$  per day   Lisinopril     Presumed cause of cough   Metformin And Related     GI upset.     Zocor [Simvastatin] Other (See Comments)    Myalgia     Patient Measurements: Height: '5\' 8"'$  (172.7 cm) Weight: 77.4 kg (170 lb 10.2 oz) IBW/kg (Calculated) : 68.4 Heparin Dosing Weight: 77 kg  Vital Signs: Temp: 97.8 F (36.6 C) (01/03 0238) Temp Source: Axillary (01/03 0238) BP: 113/72 (01/03 0238) Pulse Rate: 77 (01/03 0351)  Labs: Recent Labs    09/23/22 0425 09/24/22 0033 09/24/22 1650 09/24/22 1811 09/25/22 0256  HGB 13.7 14.0  --   --  14.7  HCT 37.6* 39.3  --   --  40.5  PLT 160 157  --   --  180  LABPROT 15.4*  --   --   --   --   INR 1.2  --   --   --   --   HEPARINUNFRC  --   --   --   --  0.36  CREATININE 1.33* 1.17  --   --  1.23  CKTOTAL 1,001* 1,061*  --   --  504*  TROPONINIHS  --   --  1,104* 891*  --      Estimated Creatinine Clearance: 39.4 mL/min (by C-G formula based on SCr of 1.23 mg/dL).   Medical History: Past Medical History:  Diagnosis Date   Arthritis    Basal cell carcinoma of scalp 12/16/07   Reexcision (Dr. Merril Abbe. Teressa Senter)   BPH (benign prostatic hyperplasia)    CAD (coronary artery disease) 11/01   stent placed   Cataract    Diabetes mellitus type II 11/01   GERD (gastroesophageal reflux disease)    occasionally   Hearing aid worn    HOH (hard of hearing)    Hyperlipemia 11/01   Hypertension 11/01   Ruptured disc, cervical    Neck C7 (Dr. Pearlie Oyster)    Medications:  Scheduled:   aspirin EC  81 mg Oral QHS   finasteride  5 mg Oral Daily   guaiFENesin  600 mg Oral BID   insulin aspart  0-20 Units Subcutaneous TID WC   insulin aspart  0-5 Units Subcutaneous QHS   insulin  glargine-yfgn  25 Units Subcutaneous BID   levalbuterol  0.63 mg Nebulization Q6H   And   ipratropium  0.5 mg Nebulization Q6H   methylPREDNISolone (SOLU-MEDROL) injection  80 mg Intravenous Q12H   metoprolol tartrate  5 mg Intravenous Q6H   potassium chloride  20 mEq Oral Once   tamsulosin  0.4 mg Oral QHS   Infusions:   ampicillin-sulbactam (UNASYN) IV 3 g (09/24/22 2330)   heparin 1,050 Units/hr (09/24/22 1955)    Assessment: 89 y.o. M presents s/p fall. Trop 1104. To begin heparin for ACS. No AC PTA and no VTE prophylaxis currently. CBC stable.  1/3 AM update:  Heparin level therapeutic   Goal of Therapy:  Heparin level 0.3-0.7 units/ml Monitor platelets by anticoagulation protocol: Yes   Plan:  Cont heparin 1050 units/hr 1200 heparin level  Narda Bonds, PharmD, BCPS Clinical Pharmacist Phone: [phone]

## 2022-09-25 NOTE — Consult Note (Addendum)
Cardiology Consultation   Patient ID: JAYVYN HASELTON MRN: [id number]; DOB: [birth year]  Admit date: 08/30/2022 Date of Consult: 09/25/2022  PCP:  Tonia Ghent, MD   [facility] Cardiologist:  Sanda Klein, MD      Patient Profile:   AHNAF CAPONI is a 89 y.o. male with a hx of CAD, HTN, HLD, type 2 DM, ascending aortic aneurysm, BPH, early dementia, hearing loss who is being seen 09/25/2022 for the evaluation of new CHF at the request of Dr. Cyndia Skeeters.  History of Present Illness:   Mr. Dragoo is an 89 year old male with above medical history who is followed by Dr. Sallyanne Kuster. Per chart review, patient has a remote history of CAD. Cardiac catheterization in 2001 showed a normal left main, 30% stenosis in the distal LAD, 40% stenosis in the mid LAD, 90% stenosis in the proximal diag 1, 90% stenosis mid RCA, no significant disease in CFX. There was a DES placed to the mid RCA. Later, a nuclear stress test in 12/13/2010 showed normal perfusion, EF 73%. CT chest in 09/2019 showed a 4.4 cm ascending aortic aneurysm with mild aortic atherosclerosis. Repeat CTA chest in 04/2020 showed a 4.1 cm ascending aortic aneurysm.   Patient was last seen by cardiology on 44/7/23. At that time, patient complained of having recurrent issues to chest discomfort that was located to the right of his sternum whenever he climbed up a flight of stairs. Chest pain was relieved with rest. Dr. Sallyanne Kuster determined that patient was not a good candidate for invasive evaluation or revascularization and instead was started on amlodipine 2.5 mg daily.   Patient was seen at the [facility] ED on 12/20 for evaluation of cough, fever, and weakness. He was diagnosed with RSV at that time. On 12/31, son found patient on the floor at home, and he returned to the ED for evaluation. Vital signs in the ED significant for max temperature 103F, SBP in the 90s-110s, oxygen saturation 82% on room air (improved to 95% on 4 L [place]).  Initial CXR with stable cardiomegaly, mild left basilar linear atelectasis. Patient was admitted to the hospitalist service for sepsis, acute respiratory failure with hypoxia in the setting of RSV infection, AKI, and acute metabolic encephalopathy.   On the afternoon of 1/2, patient's breathing status worsened. Repeat CXR on 1/2 showed diffuse increase interstitial opacities, Left>right, compatible with pulmonary edema. hsTn [id number]. BNP elevated to 1213. He was started on IV lasix. Echocardiogram on 1/3 showed EF 30-35% with regional wall motion abnormalities suggestive of LAD disease, grade III diastolic dysfunction. Cardiology was asked to consult for further evaluation of reduced EF.   Patient if very difficult to obtain information from due to his hearing loss, confusion.   Past Medical History:  Diagnosis Date   Arthritis    Basal cell carcinoma of scalp 12/16/07   Reexcision (Dr. Merril Abbe. Teressa Senter)   BPH (benign prostatic hyperplasia)    CAD (coronary artery disease) 11/01   stent placed   Cataract    Diabetes mellitus type II 11/01   GERD (gastroesophageal reflux disease)    occasionally   Hearing aid worn    HOH (hard of hearing)    Hyperlipemia 11/01   Hypertension 11/01   Ruptured disc, cervical    Neck C7 (Dr. Pearlie Oyster)    Past Surgical History:  Procedure Laterality Date   BASAL CELL CARCINOMA EXCISION     BRONCHIAL BRUSHINGS  05/16/2018   Procedure: ESOPHAGEAL BRUSHINGS;  Surgeon:  Mansouraty, Telford Nab., MD;  Location: [facility];  Service: Gastroenterology;;   Lillard Anes  02/2000   Dr. Janus Molder - Right   CERVICAL FUSION     COLONOSCOPY     2012   COLONOSCOPY N/A 05/17/2018   Procedure: COLONOSCOPY;  Surgeon: Rush Landmark Telford Nab., MD;  Location: [facility];  Service: Gastroenterology;  Laterality: N/A;   ESOPHAGOGASTRODUODENOSCOPY (EGD) WITH PROPOFOL N/A 05/16/2018   Procedure: ESOPHAGOGASTRODUODENOSCOPY (EGD) WITH PROPOFOL ;  Surgeon: Rush Landmark  Telford Nab., MD;  Location: [facility];  Service: Gastroenterology;  Laterality: N/A;   HOT HEMOSTASIS N/A 05/16/2018   Procedure: HOT HEMOSTASIS (ARGON PLASMA COAGULATION/BICAP);  Surgeon: Irving Copas., MD;  Location: [facility];  Service: Gastroenterology;  Laterality: N/A;   KNEE ARTHROSCOPY  09/1999   Right   LACERATION REPAIR  09/1999   Right Hand (Dr. Fredna Dow)   LITHOTRIPSY  1991   Dr. Elyse Jarvis   POLYPECTOMY  05/17/2018   Procedure: POLYPECTOMY;  Surgeon: Rush Landmark Telford Nab., MD;  Location: [facility];  Service: Gastroenterology;;   ROTATOR CUFF REPAIR  02/05   Left     Home Medications:  Prior to Admission medications   Medication Sig Start Date End Date Taking? Authorizing Provider  amLODipine (NORVASC) 2.5 MG tablet Held as of 09/06/22 Patient taking differently: Take 2.5 mg by mouth daily. 09/06/22  Yes Tonia Ghent, MD  aspirin 81 MG tablet Take 81 mg by mouth at bedtime.   Yes [provider]  bismuth subsalicylate (PEPTO BISMOL) 262 MG/15ML suspension Take 30 mLs by mouth every 6 (six) hours as needed for indigestion or diarrhea or loose stools.   Yes [provider]  donepezil (ARICEPT) 5 MG tablet TAKE ONE TABLET BY MOUTH AT bedtime Patient taking differently: Take 5 mg by mouth at bedtime. TAKE ONE TABLET BY MOUTH AT bedtime 08/28/22  Yes Tonia Ghent, MD  ferrous sulfate 325 (65 FE) MG tablet Take 1 tablet (325 mg total) by mouth daily with breakfast. 11/19/18  Yes Tonia Ghent, MD  finasteride (PROSCAR) 5 MG tablet Take 1 tablet (5 mg total) by mouth daily. 11/15/21  Yes Tonia Ghent, MD  glyBURIDE (DIABETA) 5 MG tablet TAKE TWO TABLETS BY MOUTH ONCE DAILY WITH BREAKFAST Patient taking differently: Take 5 mg by mouth daily with breakfast. TAKE TWO TABLETS BY MOUTH ONCE DAILY WITH BREAKFAST 11/15/21  Yes Tonia Ghent, MD  hydrochlorothiazide (MICROZIDE) 12.5 MG capsule Take 1 capsule (12.5 mg total) by mouth daily.  11/15/21  Yes Tonia Ghent, MD  LANTUS SOLOSTAR 100 UNIT/ML Solostar Pen inject 46 units into THE SKIN daily Patient taking differently: Inject 46 Units into the skin daily. 05/22/22  Yes Tonia Ghent, MD  Melatonin 5 MG TABS Take 5 mg by mouth at bedtime as needed (sleep).   Yes [provider]  metoprolol succinate (TOPROL-XL) 50 MG 24 hr tablet Take 1 tablet (50 mg total) by mouth daily. Take with or immediately following a meal. 11/15/21  Yes Tonia Ghent, MD  mirtazapine (REMERON) 7.5 MG tablet Take 1 tablet (7.5 mg total) by mouth at bedtime. 11/15/21  Yes Tonia Ghent, MD  Multiple Vitamin (MULTIVITAMIN) tablet Take 1 tablet by mouth daily. breakfast   Yes [provider]  nitroGLYCERIN (NITROSTAT) 0.4 MG SL tablet Dissolve 1 tab under tongue as needed for chest pain. May repeat every 5 minutes x 2 doses. If no relief call 9-1-1. 05/22/22  Yes Tonia Ghent, MD  pantoprazole (Barber) 40  MG tablet Take 1 tablet (40 mg total) by mouth daily. 11/15/21  Yes Tonia Ghent, MD  pravastatin (PRAVACHOL) 40 MG tablet Take 1 tablet (40 mg total) by mouth at bedtime. 12/28/21  Yes Croitoru, Mihai, MD  tamsulosin (FLOMAX) 0.4 MG CAPS capsule Take 1 capsule (0.4 mg total) by mouth at bedtime. 11/15/21  Yes Tonia Ghent, MD  acetaminophen (TYLENOL) 500 MG tablet Take 500 mg by mouth 2 (two) times daily as needed (for pain or headaches). Patient not taking: Reported on 09/23/2022    [provider]  blood glucose meter kit and supplies Dispense Accu-chek meter. Check sugar once daily. DX E11.9 10/07/19   Tonia Ghent, MD  COMFORT EZ PEN NEEDLES 31G X 5 MM MISC Use daily with insulin pen. 05/22/22   Tonia Ghent, MD  Lancets [facility] ULTRASOFT) lancets USE TO check blood glucose UP TO three times daily AS DIRECTED 02/28/22   Tonia Ghent, MD  [facility] VERIO test strip USE TO check blood glucose UP TO three times daily AS DIRECTED 08/28/22   Tonia Ghent,  MD    Inpatient Medications: Scheduled Meds:  aspirin EC  81 mg Oral QHS   finasteride  5 mg Oral Daily   furosemide  60 mg Intravenous BID   guaiFENesin  600 mg Oral BID   insulin aspart  0-20 Units Subcutaneous TID WC   insulin aspart  0-5 Units Subcutaneous QHS   insulin glargine-yfgn  25 Units Subcutaneous BID   levalbuterol  0.63 mg Nebulization Q6H   And   ipratropium  0.5 mg Nebulization Q6H   methylPREDNISolone (SOLU-MEDROL) injection  80 mg Intravenous Q12H   metoprolol tartrate  50 mg Oral BID   potassium chloride  20 mEq Oral Once   tamsulosin  0.4 mg Oral QHS   Continuous Infusions:  ampicillin-sulbactam (UNASYN) IV 3 g (09/25/22 1140)   heparin 1,200 Units/hr (09/25/22 1329)   PRN Meds: acetaminophen **OR** acetaminophen, benzonatate, haloperidol lactate, melatonin  Allergies:    Allergies  Allergen Reactions   Nsaids Other (See Comments)    H/o anemia   Citrus Hives   Donepezil     Irritable with 28m dose but can tolerate 551mper day   Lisinopril     Presumed cause of cough   Metformin And Related     GI upset.     Zocor [Simvastatin] Other (See Comments)    Myalgia     Social History:   Social History   Socioeconomic History   Marital status: Married    Spouse name: Not on file   Number of children: 2   Years of education: Not on file   Highest education level: Not on file  Occupational History   Occupation: Carpentry  Tobacco Use   Smoking status: Former    Packs/day: 1.00    Years: 25.00    Total pack years: 25.00    Types: Cigarettes    Quit date: 01/09/1976    Years since quitting: 46.7   Smokeless tobacco: Former    Types: Chew   Tobacco comments:    quit smoking about 25 years ago  Vaping Use   Vaping Use: Never used  Substance and Sexual Activity   Alcohol use: No   Drug use: No   Sexual activity: Never  Other Topics Concern   Not on file  Social History Narrative   Married 1955   2 kids   Retired from [employer]  Social Determinants of Health   Financial Resource Strain: Low Risk  (01/23/2022)   Overall Financial Resource Strain (CARDIA)    Difficulty of Paying Living Expenses: Not very hard  Food Insecurity: No Food Insecurity (01/23/2022)   Hunger Vital Sign    Worried About Running Out of Food in the Last Year: Never true    Ran Out of Food in the Last Year: Never true  Transportation Needs: Not on file  Physical Activity: Not on file  Stress: Not on file  Social Connections: Not on file  Intimate Partner Violence: Not on file    Family History:    Family History  Problem Relation Age of Onset   Stroke Mother        Lived 2 years   Heart disease Mother        CAD, Angioplasty X 2   Hypertension Mother    Heart disease Father        MI   Hypertension Father    Hypertension Sister    Hypertension Brother    Heart disease Brother        MI   Hyperlipidemia Brother    Heart disease Brother        MI     ROS:  Please see the history of present illness.   All other ROS reviewed and negative.     Physical Exam/Data:   Vitals:   09/25/22 0801 09/25/22 0904 09/25/22 0956 09/25/22 1117  BP: 106/67   124/75  Pulse: 90   76  Resp: (!) 27   (!) 25  Temp: 97.8 F (36.6 C)  (!) 97.5 F (36.4 C) 97.7 F (36.5 C)  TempSrc: Oral  Oral Oral  SpO2: (!) 89% 95%  95%  Weight:      Height:        Intake/Output Summary (Last 24 hours) at 09/25/2022 1344 Last data filed at 09/25/2022 1333 Gross per 24 hour  Intake 356.13 ml  Output 1900 ml  Net -1543.87 ml      09/24/2022    5:00 AM 09/23/2022   11:41 PM 09/06/2022    9:03 PM  Last 3 Weights  Weight (lbs) 170 lb 10.2 oz 170 lb 10.2 oz 180 lb 12.4 oz  Weight (kg) 77.4 kg 77.4 kg 82 kg     Body mass index is 25.95 kg/m.  General:  Elderly male, laying in bed with head elevated. Very hard of hearing, not wearing hearing aids  HEENT: normal Neck: no JVD with head of bed elevated to 30 degrees  Vascular: No carotid  bruits; Radial pulses 2+ bilaterally  Cardiac:  normal S1, S2; RRR; no murmur  Lungs:  coarse breath sounds throughout. Crackles in bilateral lung bases  Abd: soft, nontender, no hepatomegaly  Ext: no edema in BLE Musculoskeletal:  No deformities, BUE and BLE strength normal and equal Skin: warm and dry  Neuro:  CNs 2-12 intact, no focal abnormalities noted Psych:  Normal affect   EKG:  The EKG was personally reviewed and demonstrates: normal sinus rhythm, PVC present, nonspecific ST changes  Telemetry:  Telemetry was personally reviewed and demonstrates:  NSR  Relevant CV Studies:  Echocardiogram 1/3  1. Left ventricular ejection fraction, by estimation, is 30 to 35%. The  left ventricle has moderately decreased function. The left ventricle  demonstrates regional wall motion abnormalities (suggestive of LAD  disease, no LV thrombus). Left ventricular  diastolic parameters are consistent with Grade III diastolic dysfunction  (restrictive).  2. Right ventricular systolic function was not well visualized. The right  ventricular size is normal. Tricuspid regurgitation signal is inadequate  for assessing PA pressure.   3. The mitral valve was not well visualized. No evidence of mitral valve  regurgitation.   4. The aortic valve was not well visualized. Aortic valve regurgitation  is not visualized.   5. The inferior vena cava is normal in size with greater than 50%  respiratory variability, suggesting right atrial pressure of 3 mmHg.   Comparison(s): No prior Echocardiogram.   Laboratory Data:  High Sensitivity Troponin:   Recent Labs  Lab 09/24/22 1650 09/24/22 1811  TROPONINIHS 1,104* 891*     Chemistry Recent Labs  Lab 09/23/22 0425 09/24/22 0033 09/25/22 0256  NA 133* 135 134*  K 3.4* 3.4* 3.9  CL 94* 98 100  CO2 28 25 20*  GLUCOSE 244* 211* 346*  BUN 20 21 34*  CREATININE 1.33* 1.17 1.23  CALCIUM 8.1* 8.5* 8.3*  MG 1.7 1.8 1.8  GFRNONAA 51* 60* 56*   ANIONGAP _0 Recent Labs  Lab 09/23/22 0425 09/24/22 0033 09/25/22 0256  PROT 5.9*  --  6.2*  ALBUMIN 3.0* 2.9* 2.6*  AST 71*  --  116*  ALT 41  --  97*  ALKPHOS 50  --  78  BILITOT 1.2  --  1.0   Lipids No results for input(s): "CHOL", "TRIG", "HDL", "LABVLDL", "LDLCALC", "CHOLHDL" in the last 168 hours.  Hematology Recent Labs  Lab 09/23/22 0425 09/24/22 0033 09/25/22 0256  WBC 12.5* 15.1* 15.4*  RBC 4.01* 4.15* 4.35  HGB 13.7 14.0 14.7  HCT 37.6* 39.3 40.5  MCV 93.8 94.7 93.1  MCH 34.2* 33.7 33.8  MCHC 36.4* 35.6 36.3*  RDW 13.9 13.6 13.6  PLT 160 157 180   Thyroid  Recent Labs  Lab 09/23/22 0425  TSH 0.710    BNP Recent Labs  Lab 09/23/22 0425 09/24/22 1651 09/25/22 0256  BNP 465.8* 491.8* 1,213.7*    DDimer No results for input(s): "DDIMER" in the last 168 hours.   Radiology/Studies:  ECHOCARDIOGRAM COMPLETE  Result Date: 09/25/2022    ECHOCARDIOGRAM REPORT   Patient Name:   PIERRE DELLAROCCO [facility] Date of Exam: 09/25/2022 Medical Rec #:  [id number]   Height:       68.0 in Accession #:    [id number]  Weight:       170.6 lb Date of Birth:  Dec 08, 1930   BSA:          1.910 m Patient Age:    30 years    BP:           106/67 mmHg Patient Gender: M           HR:           89 bpm. Exam Location:  Inpatient Procedure: 2D Echo and Intracardiac Opacification Agent Indications:    elevated troponin  History:        Patient has no prior history of Echocardiogram examinations.                 CAD; Risk Factors:Hypertension, Dyslipidemia and Diabetes.  Sonographer:    Harvie Junior Referring Phys: [id number] Charlesetta Ivory GONFA  Sonographer Comments: Technically difficult study due to poor echo windows. Image acquisition challenging due to respiratory motion. IMPRESSIONS  1. Left ventricular ejection fraction, by estimation, is 30 to 35%. The left ventricle has moderately decreased function. The left ventricle demonstrates regional wall motion  abnormalities (suggestive of LAD disease, no LV  thrombus). Left ventricular diastolic parameters are consistent with Grade III diastolic dysfunction (restrictive).  2. Right ventricular systolic function was not well visualized. The right ventricular size is normal. Tricuspid regurgitation signal is inadequate for assessing PA pressure.  3. The mitral valve was not well visualized. No evidence of mitral valve regurgitation.  4. The aortic valve was not well visualized. Aortic valve regurgitation is not visualized.  5. The inferior vena cava is normal in size with greater than 50% respiratory variability, suggesting right atrial pressure of 3 mmHg. Comparison(s): No prior Echocardiogram. Conclusion(s)/Recommendation(s): Reaching out to primary team. FINDINGS  Left Ventricle: Left ventricular ejection fraction, by estimation, is 30 to 35%. The left ventricle has moderately decreased function. The left ventricle demonstrates regional wall motion abnormalities. The left ventricular internal cavity size was normal in size. There is no left ventricular hypertrophy. Left ventricular diastolic parameters are consistent with Grade III diastolic dysfunction (restrictive).  LV Wall Scoring: The mid and distal anterior septum, mid inferoseptal segment, apical anterior segment, apical inferior segment, and apex are akinetic. The mid and distal lateral wall is hypokinetic. Right Ventricle: The right ventricular size is normal. No increase in right ventricular wall thickness. Right ventricular systolic function was not well visualized. Tricuspid regurgitation signal is inadequate for assessing PA pressure. Left Atrium: Left atrial size was not well visualized. Right Atrium: Right atrial size was not well visualized. Pericardium: There is no evidence of pericardial effusion. Mitral Valve: The mitral valve was not well visualized. No evidence of mitral valve regurgitation. Tricuspid Valve: The tricuspid valve is not well visualized. Tricuspid valve regurgitation is not  demonstrated. Aortic Valve: The aortic valve was not well visualized. Aortic valve regurgitation is not visualized. Pulmonic Valve: The pulmonic valve was not well visualized. Pulmonic valve regurgitation is not visualized. Aorta: The aortic root and ascending aorta are structurally normal, with no evidence of dilitation. Venous: The inferior vena cava is normal in size with greater than 50% respiratory variability, suggesting right atrial pressure of 3 mmHg. IAS/Shunts: The interatrial septum was not well visualized.  LEFT VENTRICLE PLAX 2D LVIDd:         4.30 cm      Diastology LVIDs:         3.40 cm      LV e' medial:    4.46 cm/s LV PW:         1.20 cm      LV E/e' medial:  27.4 LV IVS:        1.20 cm      LV e' lateral:   4.35 cm/s LVOT diam:     2.20 cm      LV E/e' lateral: 28.0 LV SV:         47 LV SV Index:   25 LVOT Area:     3.80 cm  LV Volumes (MOD) LV vol d, MOD A2C: 93.7 ml LV vol d, MOD A4C: 142.0 ml LV vol s, MOD A2C: 64.1 ml LV vol s, MOD A4C: 76.5 ml LV SV MOD A2C:     29.6 ml LV SV MOD A4C:     142.0 ml LV SV MOD BP:      56.1 ml RIGHT VENTRICLE RV S prime:     20.30 cm/s TAPSE (M-mode): 1.8 cm LEFT ATRIUM           Index LA diam:      3.50 cm 1.83 cm/m LA Vol (A4C): 47.4  ml 24.81 ml/m  AORTIC VALVE             PULMONIC VALVE LVOT Vmax:   59.43 cm/s  PV Vmax:          0.80 m/s LVOT Vmean:  39.000 cm/s PV Peak grad:     2.6 mmHg LVOT VTI:    0.123 m     PR End Diast Vel: 4.33 msec  AORTA Ao Root diam: 3.90 cm Ao Asc diam:  3.80 cm MITRAL VALVE MV Area (PHT): 4.57 cm     SHUNTS MV Decel Time: 166 msec     Systemic VTI:  0.12 m MV E velocity: 122.00 cm/s  Systemic Diam: 2.20 cm MV A velocity: 34.30 cm/s MV E/A ratio:  3.56 Rudean Haskell MD Electronically signed by Rudean Haskell MD Signature Date/Time: 09/25/2022/12:05:33 PM    Final    DG Chest Port 1 View  Result Date: 09/25/2022 CLINICAL DATA:  Acute on chronic respiratory failure with hypoxia and hypercapnia EXAM: PORTABLE CHEST  1 VIEW COMPARISON:  Chest 09/24/2022 FINDINGS: Progression of left lower lobe infiltrate likely pneumonia. Improvement in airspace disease in the right lung. Negative for pleural effusion IMPRESSION: Progression of left lower lobe infiltrate likely pneumonia. Electronically Signed   By: Franchot Gallo M.D.   On: 09/25/2022 08:15   DG Chest Port 1 View  Result Date: 09/24/2022 CLINICAL DATA:  Acute respiratory distress. EXAM: PORTABLE CHEST 1 VIEW COMPARISON:  08/29/2022 FINDINGS: Artifact from overlying cardiac leads. Stable cardiomediastinal contours. Diffuse increase interstitial opacities are identified, left greater than right. No signs of pleural effusion. Osseous structures appear grossly intact. IMPRESSION: Diffuse increase interstitial opacities, left greater than right, compatible with pulmonary edema. Electronically Signed   By: Kerby Moors M.D.   On: 09/24/2022 17:10   CT HEAD WO CONTRAST (5MM)  Result Date: 09/10/2022 CLINICAL DATA:  Head trauma with headaches, initial encounter EXAM: CT HEAD WITHOUT CONTRAST TECHNIQUE: Contiguous axial images were obtained from the base of the skull through the vertex without intravenous contrast. RADIATION DOSE REDUCTION: This exam was performed according to the departmental dose-optimization program which includes automated exposure control, adjustment of the mA and/or kV according to patient size and/or use of iterative reconstruction technique. COMPARISON:  08/15/2019 FINDINGS: Brain: No evidence of acute infarction, hemorrhage, hydrocephalus, extra-axial collection or mass lesion/mass effect. Chronic atrophic and ischemic changes are again seen. Vascular: No hyperdense vessel or unexpected calcification. Skull: Normal. Negative for fracture or focal lesion. Sinuses/Orbits: No acute finding. Other: None. IMPRESSION: Chronic atrophic and ischemic changes without acute abnormality. Electronically Signed   By: Inez Catalina M.D.   On: 09/21/2022 23:01   DG  Chest Portable 1 View  Result Date: 09/21/2022 CLINICAL DATA:  Altered mental status. EXAM: PORTABLE CHEST 1 VIEW COMPARISON:  January 17, 2020 FINDINGS: There is stable mild to moderate severity enlargement of the cardiac silhouette. Mild linear atelectasis is seen within the left lung base. There is no evidence of a pleural effusion or pneumothorax. The visualized skeletal structures are unremarkable. IMPRESSION: Stable cardiomegaly with mild left basilar linear atelectasis. Electronically Signed   By: Virgina Norfolk M.D.   On: 08/29/2022 22:29     Assessment and Plan:   Acute on chronic combined systolic and diastolic heart failure  Elevated Troponin, NSTEMI  CAD  - Patient has a remote history of CAD, had a DES to the mid RCA in 2001  - Now, patient was brought to the ED after his son found him down  on the ground. Recently diagnosed with RSV. Admitted for treatment of severe sepsis, RSV, mild rhabdo, and acute hypoxic respiratory failure  - On 1/2, patient had worsening of respiratory status. CXR on 1/2 showed diffuse increase in interstitial opacities, Left>right, consistent with pulmonary edema. BNP elevated to 1213.  - hsTn 1104>891 - Echocardiogram this AM showed EF 30-35% with regional wall motion abnormalities  - Patient was started on IV heparin yesterday - Given patient's severe sepsis, RSV infection, possible pneumonia, and baseline dementia/delirium, patient would not be a good candidate for cardiac catheterization at this time. Continue IV heparin for at least 48 hours   - Receiving IV lasix 60 mg BID-- Patient was on IV fluids for treatment of rhabdo, sepsis. Currently net +1.9 L since admission. Renal function stable after 2 doses. Patient continues to have crackles in lungs. Continue IV lasix for now, and likely can transition to PO lasix tomorrow  - Continue daily ASA, metoprolol 50 mg BID. Resume statin when recovered from acute illness/when ck and liver function improved  -  BP soft-- difficult to add GDMT at this time. Once patient is off IV lasix, can attempt to add low-dose ARB vs spiro   Otherwise per primary  - Severe sepsis due to RSV infection, possible aspiration pneumonia  - Acute encephalopathy, delirium  - Generalized weakness  - AKI (resolved)  - Goals of care   Risk Assessment/Risk Scores:   TIMI Risk Score for Unstable Angina or Non-ST Elevation MI:   The patient's TIMI risk score is 4, which indicates a 20% risk of all cause mortality, new or recurrent myocardial infarction or need for urgent revascularization in the next 14 days.  New York Heart Association (NYHA) Functional Class NYHA Class IV      For questions or updates, please contact [place] Please consult [email or web address] for contact info under    Signed, Margie Billet, PA-C  09/25/2022 1:44 PM   Patient seen and examined. Agree with assessment and plan.  Mr. Sreekar Broyhill is an 89 year old gentleman who is followed by Dr. Sallyanne Kuster.  He has established CAD and had undergone DES stenting to his mid RCA.  He has a documented mild ascending aortic aneurysm with mild aortic atherosclerosis.  He was recently diagnosed with RSV leading to [facility] evaluation on September 11, 2022.  He recently was found on his floor at his home on September 22, 2022 and on representation was febrile to 103, blood pressure was low, O2 saturation 82% which improved to 95% with 4 L of supplemental oxygen.  He was admitted to the hospital for sepsis, acute respiratory failure with hypoxia in the setting of his RSV infection as well as metabolic encephalopathy and AKA.  He was found to have increased interstitial opacities on chest x-ray left greater than right consistent with pulmonary edema.  BNP was elevated up to 1213.  An echo Doppler study showed an EF of 30 to 35% with wall motion abnormality suggestive of LAD disease as well as restrictive physiology with grade 3 diastolic dysfunction.   Presently, I saw the patient with his daughter in the room.  He is not having any active chest pain or significant dyspnea.  His blood pressure currently is stable at 124/75 with pulse in the 70s.  He is hard of hearing.  There is mild JVD elevation.  He has rhonchi to his lungs bilaterally with coarse breath sounds with suggestion of basilar rales.  Abdomen is nontender with positive bowel  sounds.  There is no significant edema.  ECG from September 24, 2022 demonstrates sinus tachycardia at 101 bpm.  QS complex V1 V2.  Nonspecific ST-T changes.  PVC.  I had discussion with patient's daughter.  Presently, we will pursue a medical therapy approach.  He continues to have decreased breath sounds and has mild volume overload.  Would continue with IV Lasix today and consider transition to oral furosemide tomorrow.  He has previously been followed by Dr. Sallyanne Kuster with noninvasive strategy for his probable underlying CAD.  At present continue heparin therapy.  Will adjust medications as blood pressure and heart rate allow.   Troy Sine, MD, [facility] 09/25/2022 2:17 PM

## 2022-09-25 NOTE — Progress Notes (Signed)
[place] for Heparin Indication: chest pain/ACS  Allergies  Allergen Reactions   Nsaids Other (See Comments)    H/o anemia   Citrus Hives   Donepezil     Irritable with '10mg'$  dose but can tolerate '5mg'$  per day   Lisinopril     Presumed cause of cough   Metformin And Related     GI upset.     Zocor [Simvastatin] Other (See Comments)    Myalgia     Patient Measurements: Height: '5\' 8"'$  (172.7 cm) Weight: 77.4 kg (170 lb 10.2 oz) IBW/kg (Calculated) : 68.4 Heparin Dosing Weight: 77 kg  Vital Signs: Temp: 97.8 F (36.6 C) (01/03 1900) Temp Source: Axillary (01/03 1900) BP: 133/83 (01/03 1900) Pulse Rate: 98 (01/03 1900)  Labs: Recent Labs    09/23/22 0425 09/24/22 0033 09/24/22 1650 09/24/22 1811 09/25/22 0256 09/25/22 1224 09/25/22 2117  HGB 13.7 14.0  --   --  14.7  --   --   HCT 37.6* 39.3  --   --  40.5  --   --   PLT 160 157  --   --  180  --   --   LABPROT 15.4*  --   --   --   --   --   --   INR 1.2  --   --   --   --   --   --   HEPARINUNFRC  --   --   --   --  0.36 0.26* 0.40  CREATININE 1.33* 1.17  --   --  1.23  --   --   CKTOTAL 1,001* 1,061*  --   --  504*  --   --   TROPONINIHS  --   --  1,104* 891*  --   --   --     Estimated Creatinine Clearance: 39.4 mL/min (by C-G formula based on SCr of 1.23 mg/dL).   Medical History: Past Medical History:  Diagnosis Date   Arthritis    Basal cell carcinoma of scalp 12/16/07   Reexcision (Dr. Artist Beach)   BPH (benign prostatic hyperplasia)    CAD (coronary artery disease) 11/01   stent placed   Cataract    Diabetes mellitus type II 11/01   GERD (gastroesophageal reflux disease)    occasionally   Hearing aid worn    HOH (hard of hearing)    Hyperlipemia 11/01   Hypertension 11/01   Ruptured disc, cervical    Neck C7 (Dr. Pearlie Oyster)    Medications:  Scheduled:   aspirin EC  81 mg Oral QHS   brexpiprazole  0.5 mg Oral QHS   finasteride  5 mg Oral  Daily   furosemide  60 mg Intravenous BID   guaiFENesin  600 mg Oral BID   insulin aspart  0-20 Units Subcutaneous TID WC   insulin aspart  0-5 Units Subcutaneous QHS   insulin aspart  5 Units Subcutaneous TID WC   insulin glargine-yfgn  25 Units Subcutaneous BID   [START ON 09/26/2022] levalbuterol  0.63 mg Nebulization TID   And   ipratropium  0.5 mg Nebulization TID   methylPREDNISolone (SOLU-MEDROL) injection  80 mg Intravenous Q12H   metoprolol tartrate  50 mg Oral BID   potassium chloride  20 mEq Oral Once   tamsulosin  0.4 mg Oral QHS   Infusions:   ampicillin-sulbactam (UNASYN) IV 3 g (09/25/22 1816)   azithromycin 500 mg (09/25/22 1656)  heparin 1,200 Units/hr (09/25/22 1329)    Assessment upon initiating Heparin infusion: 89 y.o. M presents s/p fall. Trop 1104. To begin heparin for ACS. No AC PTA and no VTE prophylaxis currently. CBC stable.  Heparin level therapeutic at 0.40 after bolus and increase to 1200 units/hr.   Goal of Therapy:  Heparin level 0.3-0.7 units/ml Monitor platelets by anticoagulation protocol: Yes   Plan:  Continue heparin at 1200 units/hr Monitor daily heparin level and CBC Monitor for signs of bleeding and any issues with the heparin infusion  Sloan Leiter, PharmD, BCPS, BCCCP Please refer to [facility] for [place] numbers 09/25/2022 10:23 PM

## 2022-09-25 NOTE — Progress Notes (Signed)
Pt off bipap on Rt arrival on 6LPM [place] in no distress.  Bipap at bedside on standby.
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
## 2022-09-26 DIAGNOSIS — R410 Disorientation, unspecified: Secondary | ICD-10-CM | POA: Diagnosis not present

## 2022-09-26 DIAGNOSIS — Z7189 Other specified counseling: Secondary | ICD-10-CM

## 2022-09-26 DIAGNOSIS — Z515 Encounter for palliative care: Secondary | ICD-10-CM

## 2022-09-26 DIAGNOSIS — J9601 Acute respiratory failure with hypoxia: Secondary | ICD-10-CM | POA: Diagnosis not present

## 2022-09-26 DIAGNOSIS — J9602 Acute respiratory failure with hypercapnia: Secondary | ICD-10-CM | POA: Diagnosis not present

## 2022-09-26 DIAGNOSIS — I21A1 Myocardial infarction type 2: Secondary | ICD-10-CM | POA: Diagnosis present

## 2022-09-26 DIAGNOSIS — N179 Acute kidney failure, unspecified: Secondary | ICD-10-CM | POA: Diagnosis not present

## 2022-09-26 DIAGNOSIS — I5043 Acute on chronic combined systolic (congestive) and diastolic (congestive) heart failure: Secondary | ICD-10-CM | POA: Diagnosis not present

## 2022-09-26 DIAGNOSIS — G9341 Metabolic encephalopathy: Secondary | ICD-10-CM | POA: Diagnosis not present

## 2022-09-26 DIAGNOSIS — Z66 Do not resuscitate: Secondary | ICD-10-CM

## 2022-09-26 LAB — CBC
HCT: 40.4 % (ref 39.0–52.0)
Hemoglobin: 13.8 g/dL (ref 13.0–17.0)
MCH: 32 pg (ref 26.0–34.0)
MCHC: 34.2 g/dL (ref 30.0–36.0)
MCV: 93.7 fL (ref 80.0–100.0)
Platelets: 238 10*3/uL (ref 150–400)
RBC: 4.31 MIL/uL (ref 4.22–5.81)
RDW: 13.6 % (ref 11.5–15.5)
WBC: 18 10*3/uL — ABNORMAL HIGH (ref 4.0–10.5)
nRBC: 0 % (ref 0.0–0.2)

## 2022-09-26 LAB — COMPREHENSIVE METABOLIC PANEL
ALT: 103 U/L — ABNORMAL HIGH (ref 0–44)
AST: 121 U/L — ABNORMAL HIGH (ref 15–41)
Albumin: 2.7 g/dL — ABNORMAL LOW (ref 3.5–5.0)
Alkaline Phosphatase: 88 U/L (ref 38–126)
Anion gap: 14 (ref 5–15)
BUN: 38 mg/dL — ABNORMAL HIGH (ref 8–23)
CO2: 22 mmol/L (ref 22–32)
Calcium: 8.4 mg/dL — ABNORMAL LOW (ref 8.9–10.3)
Chloride: 100 mmol/L (ref 98–111)
Creatinine, Ser: 1.28 mg/dL — ABNORMAL HIGH (ref 0.61–1.24)
GFR, Estimated: 53 mL/min — ABNORMAL LOW (ref >60)
Glucose, Bld: 133 mg/dL — ABNORMAL HIGH (ref 70–99)
Potassium: 2.7 mmol/L — CL (ref 3.5–5.1)
Sodium: 136 mmol/L (ref 135–145)
Total Bilirubin: 1 mg/dL (ref 0.3–1.2)
Total Protein: 6.4 g/dL — ABNORMAL LOW (ref 6.5–8.1)

## 2022-09-26 LAB — GLUCOSE, CAPILLARY
Glucose-Capillary: 150 mg/dL — ABNORMAL HIGH (ref 70–99)
Glucose-Capillary: 152 mg/dL — ABNORMAL HIGH (ref 70–99)
Glucose-Capillary: 185 mg/dL — ABNORMAL HIGH (ref 70–99)
Glucose-Capillary: 128 mg/dL — ABNORMAL HIGH (ref 70–99)

## 2022-09-26 LAB — PROCALCITONIN: Procalcitonin: 2.58 ng/mL

## 2022-09-26 LAB — HEPARIN LEVEL (UNFRACTIONATED): Heparin Unfractionated: 0.32 IU/mL (ref 0.30–0.70)

## 2022-09-26 LAB — PHOSPHORUS: Phosphorus: 2.4 mg/dL — ABNORMAL LOW (ref 2.5–4.6)

## 2022-09-26 LAB — MAGNESIUM: Magnesium: 1.7 mg/dL (ref 1.7–2.4)

## 2022-09-26 MED ORDER — METHYLPREDNISOLONE SODIUM SUCC 40 MG IJ SOLR
40.0000 mg | Freq: Two times a day (BID) | INTRAMUSCULAR | Status: DC
  Administered 2022-09-26 – 2022-09-27 (×2): 40 mg via INTRAVENOUS
  Filled 2022-09-26 (×2): qty 1

## 2022-09-26 MED ORDER — ENOXAPARIN SODIUM 40 MG/0.4ML IJ SOSY
40.0000 mg | PREFILLED_SYRINGE | INTRAMUSCULAR | Status: DC
  Administered 2022-09-26 – 2022-09-28 (×3): 40 mg via SUBCUTANEOUS
  Filled 2022-09-26 (×3): qty 0.4

## 2022-09-26 MED ORDER — POTASSIUM CHLORIDE CRYS ER 20 MEQ PO TBCR
40.0000 meq | EXTENDED_RELEASE_TABLET | ORAL | Status: AC
  Administered 2022-09-26 (×2): 40 meq via ORAL
  Filled 2022-09-26 (×2): qty 2

## 2022-09-26 MED ORDER — MAGNESIUM SULFATE 2 GM/50ML IV SOLN
2.0000 g | Freq: Once | INTRAVENOUS | Status: AC
  Administered 2022-09-26: 2 g via INTRAVENOUS
  Filled 2022-09-26: qty 50

## 2022-09-26 MED ORDER — FUROSEMIDE 10 MG/ML IJ SOLN
20.0000 mg | Freq: Two times a day (BID) | INTRAMUSCULAR | Status: DC
  Administered 2022-09-26 – 2022-09-27 (×3): 20 mg via INTRAVENOUS
  Filled 2022-09-26 (×3): qty 2

## 2022-09-26 MED ORDER — CLOPIDOGREL BISULFATE 75 MG PO TABS
75.0000 mg | ORAL_TABLET | Freq: Every day | ORAL | Status: DC
  Administered 2022-09-26 – 2022-09-28 (×3): 75 mg via ORAL
  Filled 2022-09-26 (×3): qty 1

## 2022-09-26 NOTE — Progress Notes (Signed)
ANTICOAGULATION CONSULT NOTE- follow-up  Pharmacy Consult for Heparin Indication: chest pain/ACS  Allergies  Allergen Reactions   Nsaids Other (See Comments)    H/o anemia   Citrus Hives   Donepezil     Irritable with '10mg'$  dose but can tolerate '5mg'$  per day   Lisinopril     Presumed cause of cough   Metformin And Related     GI upset.     Zocor [Simvastatin] Other (See Comments)    Myalgia     Patient Measurements: Height: '5\' 8"'$  (172.7 cm) Weight: 77.4 kg (170 lb 10.2 oz) IBW/kg (Calculated) : 68.4 Heparin Dosing Weight: 77 kg  Vital Signs: Temp: 97.7 F (36.5 C) (01/04 0400) Temp Source: Oral (01/04 0400) BP: 132/78 (01/04 0400) Pulse Rate: 97 (01/04 0400)  Labs: Recent Labs    09/24/22 0033 09/24/22 0033 09/24/22 1650 09/24/22 1811 09/25/22 0256 09/25/22 1224 09/25/22 2117 09/26/22 0025  HGB 14.0  --   --   --  14.7  --   --  13.8  HCT 39.3  --   --   --  40.5  --   --  40.4  PLT 157  --   --   --  180  --   --  238  HEPARINUNFRC  --    < >  --   --  0.36 0.26* 0.40 0.32  CREATININE 1.17  --   --   --  1.23  --   --   --   CKTOTAL 1,061*  --   --   --  504*  --   --   --   TROPONINIHS  --   --  1,104* 891*  --   --   --   --    < > = values in this interval not displayed.     Estimated Creatinine Clearance: 39.4 mL/min (by C-G formula based on SCr of 1.23 mg/dL).   Medical History: Past Medical History:  Diagnosis Date   Arthritis    Basal cell carcinoma of scalp 12/16/07   Reexcision (Dr. Artist Beach)   BPH (benign prostatic hyperplasia)    CAD (coronary artery disease) 11/01   stent placed   Cataract    Diabetes mellitus type II 11/01   GERD (gastroesophageal reflux disease)    occasionally   Hearing aid worn    HOH (hard of hearing)    Hyperlipemia 11/01   Hypertension 11/01   Ruptured disc, cervical    Neck C7 (Dr. Pearlie Oyster)    Medications:  Scheduled:   aspirin EC  81 mg Oral QHS   brexpiprazole  0.5 mg Oral QHS    finasteride  5 mg Oral Daily   furosemide  60 mg Intravenous BID   guaiFENesin  600 mg Oral BID   insulin aspart  0-20 Units Subcutaneous TID WC   insulin aspart  0-5 Units Subcutaneous QHS   insulin aspart  5 Units Subcutaneous TID WC   insulin glargine-yfgn  25 Units Subcutaneous BID   levalbuterol  0.63 mg Nebulization TID   And   ipratropium  0.5 mg Nebulization TID   methylPREDNISolone (SOLU-MEDROL) injection  80 mg Intravenous Q12H   metoprolol tartrate  50 mg Oral BID   potassium chloride  20 mEq Oral Once   tamsulosin  0.4 mg Oral QHS   Infusions:   ampicillin-sulbactam (UNASYN) IV 3 g (09/26/22 0559)   azithromycin 500 mg (09/25/22 1656)   heparin 1,200 Units/hr (09/26/22 0554)  Assessment upon initiating Heparin infusion: 89 y.o. M presents s/p fall. Trop 1104. To begin heparin for ACS. No AC PTA and no VTE prophylaxis currently. CBC stable.  09/26/2021 AM update: HL 0.32- therapeutic No signs of bleeding No issues with the infusion noted  Goal of Therapy:  Heparin level 0.3-0.7 units/ml Monitor platelets by anticoagulation protocol: Yes   Plan:  Continue heparin at 1200 units/hr Monitor daily heparin level and CBC Monitor for signs of bleeding and any issues with the heparin infusion Follow-up on continuation past 48 hours (started 09/24/2022 1955)  Vaughan Basta BS, PharmD, BCPS Clinical Pharmacist 09/26/2022 7:09 AM  Contact: [phone] after 3 PM  "Be curious, not judgmental..." -Jamal Maes

## 2022-09-26 NOTE — Progress Notes (Addendum)
Rounding Note    Patient Name: Paul Terrell Date of Encounter: 09/26/2022  [place] Cardiologist: Sanda Klein, MD   Subjective   Pt is doing better today from a respiratory standpoint. Seems slightly less oriented after reviewing with nursing. No acute complaints from patient.  Inpatient Medications    Scheduled Meds:  aspirin EC  81 mg Oral QHS   brexpiprazole  0.5 mg Oral QHS   finasteride  5 mg Oral Daily   furosemide  20 mg Intravenous BID   guaiFENesin  600 mg Oral BID   insulin aspart  0-20 Units Subcutaneous TID WC   insulin aspart  0-5 Units Subcutaneous QHS   insulin aspart  5 Units Subcutaneous TID WC   insulin glargine-yfgn  25 Units Subcutaneous BID   levalbuterol  0.63 mg Nebulization TID   And   ipratropium  0.5 mg Nebulization TID   methylPREDNISolone (SOLU-MEDROL) injection  80 mg Intravenous Q12H   metoprolol tartrate  50 mg Oral BID   potassium chloride  20 mEq Oral Once   potassium chloride  40 mEq Oral Q3H   tamsulosin  0.4 mg Oral QHS   Continuous Infusions:  ampicillin-sulbactam (UNASYN) IV 3 g (09/26/22 0559)   azithromycin 500 mg (09/25/22 1656)   heparin 1,200 Units/hr (09/26/22 0554)   PRN Meds: acetaminophen **OR** acetaminophen, benzonatate, haloperidol lactate, melatonin   Vital Signs    Vitals:   09/26/22 0017 09/26/22 0400 09/26/22 0800 09/26/22 0835  BP: 102/70 132/78 138/67   Pulse: 82 97 99   Resp: 16 (!) 32 20   Temp: 97.8 F (36.6 C) 97.7 F (36.5 C)    TempSrc: Oral Oral    SpO2: 94% 93% 95% 93%  Weight:      Height:        Intake/Output Summary (Last 24 hours) at 09/26/2022 1041 Last data filed at 09/26/2022 0600 Gross per 24 hour  Intake 514.39 ml  Output 2230 ml  Net -1715.61 ml      09/24/2022    5:00 AM 09/23/2022   11:41 PM 08/28/2022    9:03 PM  Last 3 Weights  Weight (lbs) 170 lb 10.2 oz 170 lb 10.2 oz 180 lb 12.4 oz  Weight (kg) 77.4 kg 77.4 kg 82 kg      Telemetry    NSR - Personally  Reviewed  ECG    NSR with PVCs and NSST changes - Personally Reviewed  Physical Exam   GEN: No acute distress.  Alert and oriented to self Cardiac: RRR Respiratory: bilateral expiratory wheezing GI: Soft, nontender, non-distended  MS: No edema; No deformity. Neuro:  unable to answer most questions due to confusion and/or hearing loss, alert and oriented to person, no focal deficits   Labs    High Sensitivity Troponin:   Recent Labs  Lab 09/24/22 1650 09/24/22 1811  TROPONINIHS 1,104* 891*     Chemistry Recent Labs  Lab 09/23/22 0425 09/24/22 0033 09/25/22 0256 09/26/22 0025  NA 133* 135 134* 136  K 3.4* 3.4* 3.9 2.7*  CL 94* 98 100 100  CO2 28 25 20* 22  GLUCOSE 244* 211* 346* 133*  BUN 20 21 34* 38*  CREATININE 1.33* 1.17 1.23 1.28*  CALCIUM 8.1* 8.5* 8.3* 8.4*  MG 1.7 1.8 1.8 1.7  PROT 5.9*  --  6.2* 6.4*  ALBUMIN 3.0* 2.9* 2.6* 2.7*  AST 71*  --  116* 121*  ALT 41  --  97* 103*  ALKPHOS 50  --  78 88  BILITOT 1.2  --  1.0 1.0  GFRNONAA 51* 60* 56* 53*  ANIONGAP '11 12 14 14    '$ Lipids No results for input(s): "CHOL", "TRIG", "HDL", "LABVLDL", "LDLCALC", "CHOLHDL" in the last 168 hours.  Hematology Recent Labs  Lab 09/24/22 0033 09/25/22 0256 09/26/22 0025  WBC 15.1* 15.4* 18.0*  RBC 4.15* 4.35 4.31  HGB 14.0 14.7 13.8  HCT 39.3 40.5 40.4  MCV 94.7 93.1 93.7  MCH 33.7 33.8 32.0  MCHC 35.6 36.3* 34.2  RDW 13.6 13.6 13.6  PLT 157 180 238   Thyroid  Recent Labs  Lab 09/23/22 0425  TSH 0.710    BNP Recent Labs  Lab 09/23/22 0425 09/24/22 1651 09/25/22 0256  BNP 465.8* 491.8* 1,213.7*    DDimer No results for input(s): "DDIMER" in the last 168 hours.   Radiology    ECHOCARDIOGRAM COMPLETE  Result Date: 09/25/2022    ECHOCARDIOGRAM REPORT   Patient Name:   Paul Terrell [facility] Date of Exam: 09/25/2022 Medical Rec #:  [id number]   Height:       68.0 in Accession #:    [id number]  Weight:       170.6 lb Date of Birth:  [birth year]   BSA:          1.910  m Patient Age:    89 years    BP:           106/67 mmHg Patient Gender: M           HR:           89 bpm. Exam Location:  Inpatient Procedure: 2D Echo and Intracardiac Opacification Agent Indications:    elevated troponin  History:        Patient has no prior history of Echocardiogram examinations.                 CAD; Risk Factors:Hypertension, Dyslipidemia and Diabetes.  Sonographer:    Harvie Junior Referring Phys: [id number] Charlesetta Ivory GONFA  Sonographer Comments: Technically difficult study due to poor echo windows. Image acquisition challenging due to respiratory motion. IMPRESSIONS  1. Left ventricular ejection fraction, by estimation, is 30 to 35%. The left ventricle has moderately decreased function. The left ventricle demonstrates regional wall motion abnormalities (suggestive of LAD disease, no LV thrombus). Left ventricular diastolic parameters are consistent with Grade III diastolic dysfunction (restrictive).  2. Right ventricular systolic function was not well visualized. The right ventricular size is normal. Tricuspid regurgitation signal is inadequate for assessing PA pressure.  3. The mitral valve was not well visualized. No evidence of mitral valve regurgitation.  4. The aortic valve was not well visualized. Aortic valve regurgitation is not visualized.  5. The inferior vena cava is normal in size with greater than 50% respiratory variability, suggesting right atrial pressure of 3 mmHg. Comparison(s): No prior Echocardiogram. Conclusion(s)/Recommendation(s): Reaching out to primary team. FINDINGS  Left Ventricle: Left ventricular ejection fraction, by estimation, is 30 to 35%. The left ventricle has moderately decreased function. The left ventricle demonstrates regional wall motion abnormalities. The left ventricular internal cavity size was normal in size. There is no left ventricular hypertrophy. Left ventricular diastolic parameters are consistent with Grade III diastolic dysfunction (restrictive).  LV  Wall Scoring: The mid and distal anterior septum, mid inferoseptal segment, apical anterior segment, apical inferior segment, and apex are akinetic. The mid and distal lateral wall is hypokinetic. Right Ventricle: The right ventricular size is normal. No increase in right ventricular wall  thickness. Right ventricular systolic function was not well visualized. Tricuspid regurgitation signal is inadequate for assessing PA pressure. Left Atrium: Left atrial size was not well visualized. Right Atrium: Right atrial size was not well visualized. Pericardium: There is no evidence of pericardial effusion. Mitral Valve: The mitral valve was not well visualized. No evidence of mitral valve regurgitation. Tricuspid Valve: The tricuspid valve is not well visualized. Tricuspid valve regurgitation is not demonstrated. Aortic Valve: The aortic valve was not well visualized. Aortic valve regurgitation is not visualized. Pulmonic Valve: The pulmonic valve was not well visualized. Pulmonic valve regurgitation is not visualized. Aorta: The aortic root and ascending aorta are structurally normal, with no evidence of dilitation. Venous: The inferior vena cava is normal in size with greater than 50% respiratory variability, suggesting right atrial pressure of 3 mmHg. IAS/Shunts: The interatrial septum was not well visualized.  LEFT VENTRICLE PLAX 2D LVIDd:         4.30 cm      Diastology LVIDs:         3.40 cm      LV e' medial:    4.46 cm/s LV PW:         1.20 cm      LV E/e' medial:  27.4 LV IVS:        1.20 cm      LV e' lateral:   4.35 cm/s LVOT diam:     2.20 cm      LV E/e' lateral: 28.0 LV SV:         47 LV SV Index:   25 LVOT Area:     3.80 cm  LV Volumes (MOD) LV vol d, MOD A2C: 93.7 ml LV vol d, MOD A4C: 142.0 ml LV vol s, MOD A2C: 64.1 ml LV vol s, MOD A4C: 76.5 ml LV SV MOD A2C:     29.6 ml LV SV MOD A4C:     142.0 ml LV SV MOD BP:      56.1 ml RIGHT VENTRICLE RV S prime:     20.30 cm/s TAPSE (M-mode): 1.8 cm LEFT ATRIUM            Index LA diam:      3.50 cm 1.83 cm/m LA Vol (A4C): 47.4 ml 24.81 ml/m  AORTIC VALVE             PULMONIC VALVE LVOT Vmax:   59.43 cm/s  PV Vmax:          0.80 m/s LVOT Vmean:  39.000 cm/s PV Peak grad:     2.6 mmHg LVOT VTI:    0.123 m     PR End Diast Vel: 4.33 msec  AORTA Ao Root diam: 3.90 cm Ao Asc diam:  3.80 cm MITRAL VALVE MV Area (PHT): 4.57 cm     SHUNTS MV Decel Time: 166 msec     Systemic VTI:  0.12 m MV E velocity: 122.00 cm/s  Systemic Diam: 2.20 cm MV A velocity: 34.30 cm/s MV E/A ratio:  3.56 Rudean Haskell MD Electronically signed by Rudean Haskell MD Signature Date/Time: 09/25/2022/12:05:33 PM    Final    DG Chest Port 1 View  Result Date: 09/25/2022 CLINICAL DATA:  Acute on chronic respiratory failure with hypoxia and hypercapnia EXAM: PORTABLE CHEST 1 VIEW COMPARISON:  Chest 09/24/2022 FINDINGS: Progression of left lower lobe infiltrate likely pneumonia. Improvement in airspace disease in the right lung. Negative for pleural effusion IMPRESSION: Progression of left lower lobe infiltrate likely pneumonia. Electronically  Signed   By: Franchot Gallo M.D.   On: 09/25/2022 08:15   DG Chest Port 1 View  Result Date: 09/24/2022 CLINICAL DATA:  Acute respiratory distress. EXAM: PORTABLE CHEST 1 VIEW COMPARISON:  08/23/2022 FINDINGS: Artifact from overlying cardiac leads. Stable cardiomediastinal contours. Diffuse increase interstitial opacities are identified, left greater than right. No signs of pleural effusion. Osseous structures appear grossly intact. IMPRESSION: Diffuse increase interstitial opacities, left greater than right, compatible with pulmonary edema. Electronically Signed   By: Kerby Moors M.D.   On: 09/24/2022 17:10    Cardiac Studies   Echocardiogram 1/3  1. Left ventricular ejection fraction, by estimation, is 30 to 35%. The  left ventricle has moderately decreased function. The left ventricle  demonstrates regional wall motion abnormalities (suggestive  of LAD  disease, no LV thrombus). Left ventricular  diastolic parameters are consistent with Grade III diastolic dysfunction  (restrictive).   2. Right ventricular systolic function was not well visualized. The right  ventricular size is normal. Tricuspid regurgitation signal is inadequate  for assessing PA pressure.   3. The mitral valve was not well visualized. No evidence of mitral valve  regurgitation.   4. The aortic valve was not well visualized. Aortic valve regurgitation  is not visualized.   5. The inferior vena cava is normal in size with greater than 50%  respiratory variability, suggesting right atrial pressure of 3 mmHg.   Comparison(s): No prior Echocardiogram.   Patient Profile     89 y.o. male with history of CAD w/ DES mid RCA 2001, HTN, AscAA, T2DM, BPH, and HLD admitted for acute hypoxemic respiratory failure 2/2 RSV and consulted for elevated troponin and new diagnosis of HFrEF.  Assessment & Plan    Acute on chronic combined systolic and diastolic heart failure  Type 2 MI CAD  - Patient has a remote history of CAD, had a DES to the mid RCA in 2001. Admitted for treatment of severe sepsis, RSV, mild rhabdo, and acute hypoxemic respiratory failure. CXR on 1/2 showed diffuse increase in interstitial opacities, Left>right, consistent with pulmonary edema. BNP elevated to 1213, hsTn 1104>891. - Type 2 MI in the setting of sepsis causing hypoperfusion and tachycardia with increased demand on top of decreased supply - Echocardiogram showed EF 30-35% with regional wall motion abnormalities  - Patient was started on IV heparin 1/2 - IV lasix 60 mg BID decreased to 20 mg -- improved dyspnea, less crackles on exam, negative 1500 mL yesterday   - Continue daily ASA, metoprolol 50 mg BID. Add plavix for at least 1 year. Resume statin when recovered from acute illness/when ck and liver function improved  - BP improved today. Will hold off on GDMT at the moment but will continue  to re-evaluate. Once patient is off IV lasix, can attempt to add low-dose ARB vs spiro - Not a good candidate for cath due to significant morbidity and mortality with his baseline comorbid conditions which are now complicated by acute illness.   Otherwise per primary  - Severe sepsis due to RSV infection, possible aspiration pneumonia  - Acute encephalopathy, delirium  - Generalized weakness  - AKI (resolved)  - Goals of care      For questions or updates, please contact [place] Please consult [email or web address] for contact info under        Signed, Johny Blamer, DO  09/26/2022, 10:41 AM     Personally seen and examined. Agree with above.  -Type 2 myocardial infarction in  the setting of sepsis and tachycardia, supply/demand mismatch with reduced ejection fraction 30 to 35%.  IV Lasix recently decreased.  On metoprolol 50 twice daily.  Lets keep on Plavix for at least 1 year and continue with medical management.  He is not a candidate for cardiac catheterization based upon his dementia/delirium.  Candee Furbish, MD

## 2022-09-26 NOTE — Plan of Care (Signed)
  Problem: Clinical Measurements: Goal: Respiratory complications will improve Outcome: Progressing Goal: Cardiovascular complication will be avoided Outcome: Progressing   Problem: Elimination: Goal: Will not experience complications related to urinary retention Outcome: Progressing   Problem: Activity: Goal: Risk for activity intolerance will decrease Outcome: Not Progressing   Problem: Coping: Goal: Level of anxiety will decrease Outcome: Not Progressing   Problem: Skin Integrity: Goal: Risk for impaired skin integrity will decrease Outcome: Not Progressing   Problem: Safety: Goal: Non-violent Restraint(s) Outcome: Not Progressing   Problem: Clinical Measurements: Goal: Respiratory complications will improve 09/26/2022 0319 by Paul Petty, RN Outcome: Progressing 09/26/2022 0319 by Paul Petty, RN Outcome: Progressing Goal: Cardiovascular complication will be avoided 09/26/2022 0319 by Paul Petty, RN Outcome: Progressing 09/26/2022 0319 by Paul Petty, RN Outcome: Progressing   Problem: Elimination: Goal: Will not experience complications related to urinary retention 09/26/2022 0319 by Paul Petty, RN Outcome: Progressing 09/26/2022 0319 by Paul Petty, RN Outcome: Progressing   Problem: Skin Integrity: Goal: Risk for impaired skin integrity will decrease 09/26/2022 0319 by Paul Petty, RN Outcome: Progressing 09/26/2022 0319 by Paul Petty, RN Outcome: Not Progressing   Problem: Activity: Goal: Risk for activity intolerance will decrease Outcome: Not Progressing   Problem: Coping: Goal: Level of anxiety will decrease 09/26/2022 0319 by Paul Petty, RN Outcome: Not Progressing 09/26/2022 0319 by Paul Petty, RN Outcome: Not Progressing   Problem: Safety: Goal: Non-violent Restraint(s) Outcome: Not Progressing

## 2022-09-26 NOTE — Progress Notes (Signed)
Heart Failure Navigator Progress Note  Assessed for Heart & Vascular TOC clinic readiness.  Patient does not meet criteria due to per MD note severe Dementia.   Navigator will sign off at this time.Earnestine Leys, BSN, RN Heart Failure Transport planner Only

## 2022-09-26 NOTE — Evaluation (Signed)
Physical Therapy Evaluation Patient Details Name: Paul Terrell MRN: [id number] DOB: [birth year] Today's Date: 09/26/2022  History of Present Illness  Pt is 89 year old presented to [facility] on  09/23/22 with sepsis and acute respiratory failure with hypoxia in setting of RSV. Pt also with multiple falls at home, acute metabolic encephalopathy, AKI, and rhabdomyolosis. Pt also with NSTEMI and acute on chronic CHF. PMH - CAD, DM, HTN  Clinical Impression  Pt admitted with above diagnosis and presents to PT with functional limitations due to deficits listed below (See PT problem list). Pt needs skilled PT to maximize independence and safety to allow discharge to SNF at DC. Expect if mental status clears pt will progress to amb with assistance.         Recommendations for follow up therapy are one component of a multi-disciplinary discharge planning process, led by the attending physician.  Recommendations may be updated based on patient status, additional functional criteria and insurance authorization.  Follow Up Recommendations Skilled nursing-short term rehab (<3 hours/day) Can patient physically be transported by private vehicle: No    Assistance Recommended at Discharge Frequent or constant Supervision/Assistance  Patient can return home with the following  A lot of help with walking and/or transfers;A lot of help with bathing/dressing/bathroom;Assist for transportation;Assistance with cooking/housework    Equipment Recommendations Other (comment) (To be determined)  Recommendations for Other Services       Functional Status Assessment Patient has had a recent decline in their functional status and demonstrates the ability to make significant improvements in function in a reasonable and predictable amount of time.     Precautions / Restrictions Precautions Precautions: Fall      Mobility  Bed Mobility Overal bed mobility: Needs Assistance Bed Mobility: Supine to Sit, Sit to Supine      Supine to sit: Max assist, HOB elevated Sit to supine: +2 for physical assistance, Max assist   General bed mobility comments: Assist to bring legs off of bed, elevate trunk into sitting, and bring hips to EOB. Assist with all aspects returning to supine due to [facility]    Transfers Overall transfer level: Needs assistance Equipment used: 2 person hand held assist Transfers: Sit to/from Stand Sit to Stand: +2 physical assistance, Mod assist           General transfer comment: Assist to bring hips and for balance.    Ambulation/Gait             Pre-gait activities: Stood x 30 sec but unable to side step up EOB    Stairs            Wheelchair Mobility    Modified Rankin (Stroke Patients Only)       Balance Overall balance assessment: Needs assistance Sitting-balance support: No upper extremity supported, Feet supported, Bilateral upper extremity supported Sitting balance-Leahy Scale: Poor Sitting balance - Comments: Sits EOB with min guard and intermittent min assist Postural control: Posterior lean Standing balance support: Bilateral upper extremity supported Standing balance-Leahy Scale: Poor Standing balance comment: Bilateral Hand held assist for static standing                             Pertinent Vitals/Pain Pain Assessment Pain Assessment: Faces Faces Pain Scale: No hurt    Home Living Family/patient expects to be discharged to:: Private residence Living Arrangements: Spouse/significant other  Additional Comments: Pt unable to relate above information    Prior Function Prior Level of Function : Patient poor historian/Family not available                     Hand Dominance        Extremity/Trunk Assessment   Upper Extremity Assessment Upper Extremity Assessment: Generalized weakness    Lower Extremity Assessment Lower Extremity Assessment: Generalized weakness       Communication    Communication: HOH  Cognition Arousal/Alertness: Awake/alert Behavior During Therapy: Flat affect Overall Cognitive Status: Difficult to assess                                 General Comments: Pt followed some basic gross motor activities with gestures, tactile cues (sit up, stand up)        General Comments General comments (skin integrity, edema, etc.): VSS on O2    Exercises     Assessment/Plan    PT Assessment Patient needs continued PT services  PT Problem List Decreased strength;Decreased activity tolerance;Decreased balance;Decreased mobility;Decreased cognition;Decreased safety awareness       PT Treatment Interventions DME instruction;Gait training;Functional mobility training;Therapeutic activities;Therapeutic exercise;Balance training;Patient/family education    PT Goals (Current goals can be found in the Care Plan section)  Acute Rehab PT Goals Patient Stated Goal: not stated PT Goal Formulation: Patient unable to participate in goal setting Time For Goal Achievement: 10/10/22 Potential to Achieve Goals: Fair    Frequency Min 2X/week     Co-evaluation               AM-PAC PT "6 Clicks" Mobility  Outcome Measure Help needed turning from your back to your side while in a flat bed without using bedrails?: A Lot Help needed moving from lying on your back to sitting on the side of a flat bed without using bedrails?: A Lot Help needed moving to and from a bed to a chair (including a wheelchair)?: Total Help needed standing up from a chair using your arms (e.g., wheelchair or bedside chair)?: Total Help needed to walk in hospital room?: Total Help needed climbing 3-5 steps with a railing? : Total 6 Click Score: 8    End of Session Equipment Utilized During Treatment: Gait belt;Oxygen Activity Tolerance: Patient tolerated treatment well Patient left: in bed;with call bell/phone within reach;with bed alarm set Nurse Communication: Mobility  status PT Visit Diagnosis: Other abnormalities of gait and mobility (R26.89);Muscle weakness (generalized) (M62.81);History of falling (Z91.81)    Time: 2703-5009 PT Time Calculation (min) (ACUTE ONLY): 16 min   Charges:   PT Evaluation $PT Eval Moderate Complexity: [place] 09/26/2022, 4:04 PM
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
## 2022-09-26 NOTE — Consult Note (Signed)
Consultation Note Date: 09/26/2022 at 1315  Patient Name: Paul Terrell  DOB: 1930-12-04  MRN: [id number]  Age / Sex: 89 y.o., male  PCP: Tonia Ghent, MD Referring Physician: Mercy Riding, MD  Reason for Consultation: Establishing goals of care  HPI/Patient Profile: 89 y.o. male  with past medical history of type 2 diabetes, HLD, HTN, BPH, and hard of hearing admitted on 09/21/2022 with weakness and multiple ground-level falls at home.  Patient has been diagnosed with acute metabolic encephalopathy, sepsis, acute respiratory failure, tested positive for RSV, AKI, and mild rhabdomyolysis.  On 1/2, patient experienced respiratory distress.  On 1/3, patient had TTE which revealed LVEF of 30 to 35%, RWMA, and G3-DD.  Patient is receiving IV antibiotics and has had intermittent use of restraints due to agitation.  PMT was consulted to discuss goals of care.   Clinical Assessment and Goals of Care: I have reviewed medical records including EPIC notes, labs and imaging, assessed the patient, he was unable to participate in goals of care and complex decision making independently, and then spoke with patient's son Paul Terrell over the phone to discuss diagnosis prognosis, [place], EOL wishes, disposition and options.  I introduced Palliative Medicine as specialized medical care for people living with serious illness. It focuses on providing relief from the symptoms and stress of a serious illness. The goal is to improve quality of life for both the patient and the family.  We discussed a brief life review of the patient.  Patient lives at home with his wife (who also has dementia).  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  Paul Terrell shares he is a strong-willed man.  As far as functional and nutritional status Paul Terrell says the patient has had some decline in functional abilities over the last month.  He has  become mildly irritated and refused assistance even though family has known he has had multiple falls.  We discussed patient's current illness and what it means in the larger context of patient's on-going co-morbidities.    I attempted to elicit values and goals of care important to the patient.  Paul Terrell shares he knows the patient would not want to live like he currently is (needing sedation for agitation and restraints for safety).  Paul Terrell shares the patient pulled through when he was COVID-positive in 2020, but shares that the patient is in a different place now.  We discussed patient's functional, nutritional, and cognitive status as indicators of prognosis.  Advance directives, concepts specific to code status, artificial feeding and hydration, and rehospitalization were considered and discussed.  Paul Terrell shares he is still in agreement for DNR to remain.  Education offered regarding concept specific to human mortality and the limitations of medical interventions to prolong life when the body begins to fail to thrive.   Discussed with Paul Terrell the importance of continued conversation with family and the medical providers regarding overall plan of care and treatment options, ensuring decisions are within the context of the patient's values and  GOCs.   When discussing his hopes and goals for his father, patient's son Paul Terrell became appropriately tearful.  Therapeutic silence, active listening, and emotional support provided.  Questions and concerns were addressed.  Paul Terrell and I plan to meet bedside at 1 PM tomorrow to continue goals of care discussion.  My recommendations to minimize patient's agitation and delirium are as follows:  - Discontinue Haldol  - Give Tylenol if patient exhibits signs of distress as pain can be an underlying contributing factor of agitation and delirium  -Continue use of Rexulti  - Give melatonin as needed nightly to aid in sleep and regulate patient's day and  nighttime  Primary Decision Maker NEXT OF KIN  Physical Exam Vitals reviewed.  Constitutional:      General: He is not in acute distress.    Appearance: He is normal weight.  HENT:     Head: Normocephalic.     Mouth/Throat:     Mouth: Mucous membranes are moist.  Eyes:     Pupils: Pupils are equal, round, and reactive to light.  Cardiovascular:     Rate and Rhythm: Normal rate.     Pulses: Normal pulses.  Pulmonary:     Effort: Pulmonary effort is normal.  Musculoskeletal:     Comments: Generalized weakness  Neurological:     Mental Status: He is alert.     Palliative Assessment/Data: 30%     Thank you for this consult. Palliative medicine will continue to follow and assist holistically.   Time Total: 75 minutes Greater than 50%  of this time was spent counseling and coordinating care related to the above assessment and plan.  Signed by: Jordan Hawks, DNP, FNP-BC Palliative Medicine    Please contact Palliative Medicine Team phone at [phone] for questions and concerns.  For individual provider: See Shea Evans

## 2022-09-27 DIAGNOSIS — R410 Disorientation, unspecified: Secondary | ICD-10-CM | POA: Diagnosis not present

## 2022-09-27 DIAGNOSIS — I5043 Acute on chronic combined systolic (congestive) and diastolic (congestive) heart failure: Secondary | ICD-10-CM | POA: Diagnosis not present

## 2022-09-27 DIAGNOSIS — J9601 Acute respiratory failure with hypoxia: Secondary | ICD-10-CM | POA: Diagnosis not present

## 2022-09-27 DIAGNOSIS — G9341 Metabolic encephalopathy: Secondary | ICD-10-CM | POA: Diagnosis not present

## 2022-09-27 DIAGNOSIS — N179 Acute kidney failure, unspecified: Secondary | ICD-10-CM | POA: Diagnosis not present

## 2022-09-27 DIAGNOSIS — W19XXXA Unspecified fall, initial encounter: Secondary | ICD-10-CM | POA: Diagnosis not present

## 2022-09-27 DIAGNOSIS — J9602 Acute respiratory failure with hypercapnia: Secondary | ICD-10-CM | POA: Diagnosis not present

## 2022-09-27 DIAGNOSIS — I21A1 Myocardial infarction type 2: Secondary | ICD-10-CM

## 2022-09-27 LAB — COMPREHENSIVE METABOLIC PANEL
ALT: 54 U/L — ABNORMAL HIGH (ref 0–44)
ALT: 80 U/L — ABNORMAL HIGH (ref 0–44)
AST: 44 U/L — ABNORMAL HIGH (ref 15–41)
AST: 58 U/L — ABNORMAL HIGH (ref 15–41)
Albumin: 1.7 g/dL — ABNORMAL LOW (ref 3.5–5.0)
Albumin: 2.6 g/dL — ABNORMAL LOW (ref 3.5–5.0)
Alkaline Phosphatase: 50 U/L (ref 38–126)
Alkaline Phosphatase: 70 U/L (ref 38–126)
Anion gap: 27 — ABNORMAL HIGH (ref 5–15)
Anion gap: 10 (ref 5–15)
BUN: 36 mg/dL — ABNORMAL HIGH (ref 8–23)
BUN: 48 mg/dL — ABNORMAL HIGH (ref 8–23)
CO2: 20 mmol/L — ABNORMAL LOW (ref 22–32)
CO2: 26 mmol/L (ref 22–32)
Calcium: 5.7 mg/dL — CL (ref 8.9–10.3)
Calcium: 8.5 mg/dL — ABNORMAL LOW (ref 8.9–10.3)
Chloride: 116 mmol/L — ABNORMAL HIGH (ref 98–111)
Chloride: 108 mmol/L (ref 98–111)
Creatinine, Ser: 2.74 mg/dL — ABNORMAL HIGH (ref 0.61–1.24)
Creatinine, Ser: 1.21 mg/dL (ref 0.61–1.24)
GFR, Estimated: 21 mL/min — ABNORMAL LOW (ref >60)
GFR, Estimated: 57 mL/min — ABNORMAL LOW (ref >60)
Glucose, Bld: 117 mg/dL — ABNORMAL HIGH (ref 70–99)
Glucose, Bld: 152 mg/dL — ABNORMAL HIGH (ref 70–99)
Potassium: 2.2 mmol/L — CL (ref 3.5–5.1)
Potassium: 3.2 mmol/L — ABNORMAL LOW (ref 3.5–5.1)
Sodium: 163 mmol/L (ref 135–145)
Sodium: 144 mmol/L (ref 135–145)
Total Bilirubin: 0.7 mg/dL (ref 0.3–1.2)
Total Bilirubin: 1.2 mg/dL (ref 0.3–1.2)
Total Protein: 3.7 g/dL — ABNORMAL LOW (ref 6.5–8.1)
Total Protein: 6.1 g/dL — ABNORMAL LOW (ref 6.5–8.1)

## 2022-09-27 LAB — CBC
HCT: 32.5 % — ABNORMAL LOW (ref 39.0–52.0)
Hemoglobin: 11.3 g/dL — ABNORMAL LOW (ref 13.0–17.0)
MCH: 33.1 pg (ref 26.0–34.0)
MCHC: 34.8 g/dL (ref 30.0–36.0)
MCV: 95.3 fL (ref 80.0–100.0)
Platelets: 197 10*3/uL (ref 150–400)
RBC: 3.41 MIL/uL — ABNORMAL LOW (ref 4.22–5.81)
RDW: 14.1 % (ref 11.5–15.5)
WBC: 11.5 10*3/uL — ABNORMAL HIGH (ref 4.0–10.5)
nRBC: 0 % (ref 0.0–0.2)

## 2022-09-27 LAB — GLUCOSE, CAPILLARY
Glucose-Capillary: 137 mg/dL — ABNORMAL HIGH (ref 70–99)
Glucose-Capillary: 179 mg/dL — ABNORMAL HIGH (ref 70–99)
Glucose-Capillary: 135 mg/dL — ABNORMAL HIGH (ref 70–99)
Glucose-Capillary: 144 mg/dL — ABNORMAL HIGH (ref 70–99)

## 2022-09-27 LAB — HEPARIN LEVEL (UNFRACTIONATED): Heparin Unfractionated: <0.1 IU/mL — ABNORMAL LOW (ref 0.30–0.70)

## 2022-09-27 LAB — CULTURE, BLOOD (ROUTINE X 2): Culture: NO GROWTH

## 2022-09-27 LAB — MAGNESIUM
Magnesium: 1.6 mg/dL — ABNORMAL LOW (ref 1.7–2.4)
Magnesium: 2.4 mg/dL (ref 1.7–2.4)

## 2022-09-27 LAB — AMMONIA: Ammonia: 25 umol/L (ref 9–35)

## 2022-09-27 LAB — PROCALCITONIN: Procalcitonin: 0.66 ng/mL

## 2022-09-27 LAB — CK: Total CK: 127 U/L (ref 49–397)

## 2022-09-27 LAB — PHOSPHORUS: Phosphorus: 2.1 mg/dL — ABNORMAL LOW (ref 2.5–4.6)

## 2022-09-27 MED ORDER — LOSARTAN POTASSIUM 25 MG PO TABS
25.0000 mg | ORAL_TABLET | Freq: Every day | ORAL | Status: DC
  Administered 2022-09-27 – 2022-09-28 (×2): 25 mg via ORAL
  Filled 2022-09-27 (×2): qty 1

## 2022-09-27 MED ORDER — PRAVASTATIN SODIUM 40 MG PO TABS
40.0000 mg | ORAL_TABLET | Freq: Every day | ORAL | Status: AC
  Administered 2022-09-27 – 2022-09-28 (×2): 40 mg via ORAL
  Filled 2022-09-27 (×2): qty 1

## 2022-09-27 MED ORDER — METOPROLOL TARTRATE 50 MG PO TABS
50.0000 mg | ORAL_TABLET | Freq: Two times a day (BID) | ORAL | Status: AC
  Administered 2022-09-27: 50 mg via ORAL
  Filled 2022-09-27: qty 1

## 2022-09-27 MED ORDER — METOPROLOL SUCCINATE ER 100 MG PO TB24
100.0000 mg | ORAL_TABLET | Freq: Every day | ORAL | Status: DC
  Administered 2022-09-28: 100 mg via ORAL
  Filled 2022-09-27: qty 1

## 2022-09-27 MED ORDER — METHYLPREDNISOLONE SODIUM SUCC 40 MG IJ SOLR
40.0000 mg | Freq: Every day | INTRAMUSCULAR | Status: DC
  Administered 2022-09-28: 40 mg via INTRAVENOUS
  Filled 2022-09-27: qty 1

## 2022-09-27 MED ORDER — POTASSIUM CHLORIDE CRYS ER 20 MEQ PO TBCR
40.0000 meq | EXTENDED_RELEASE_TABLET | ORAL | Status: AC
  Administered 2022-09-27 (×2): 40 meq via ORAL
  Filled 2022-09-27 (×2): qty 2

## 2022-09-27 NOTE — Progress Notes (Signed)
Palliative Care Progress Note, Assessment & Plan   Patient Name: Paul Terrell       Date: 09/27/2022 DOB: Nov 13, 1930  Age: 89 y.o. MRN#: [id number] Attending Physician: Mercy Riding, MD Primary Care Physician: Tonia Ghent, MD Admit Date: 08/23/2022  Subjective: Patient is lying in bed in no apparent distress.  He acknowledges my presence but is unable to make his wishes known.  His son and daughter at bedside.  HPI: 89 y.o. male  with past medical history of type 2 diabetes, HLD, HTN, BPH, and hard of hearing admitted on 09/01/2022 with weakness and multiple ground-level falls at home.  Patient has been diagnosed with acute metabolic encephalopathy, sepsis, acute respiratory failure, tested positive for RSV, AKI, and mild rhabdomyolysis.  On 1/2, patient experienced respiratory distress.  On 1/3, patient had TTE which revealed LVEF of 30 to 35%, RWMA, and G3-DD.  Patient is receiving IV antibiotics and has had intermittent use of restraints due to agitation.   PMT was consulted to discuss goals of care.   Summary of counseling/coordination of care: After reviewing the patient's chart and assessing the patient at bedside, I discussed goals of care and treatment plan with patient's son and daughter.  Brief medical update given.  Patient's son and daughter expressed they do not want patient to suffer but also want to make sure they are optimizing/treating the treatable.  They do not like to see him in restraints but feel better that he is no longer fighting them and feeling as agitated or combative.    We discussed minimizing medications that contributed to his agitation.  We also reviewed giving Tylenol as pain can be an underlying contributing factor to agitation.  I highlighted that patient has had poor  p.o. intake for several days and that if this continues then patient will likely be facing end-of-life.  [name] and [place] shared they understand that he has had a poor appetite for several days and appreciate that this is contributing to a poor prognosis.  I attempted to elicit goals and values important to the patient. [name] and Jeani Hawking were in agreement that they want to see how patient does over the weekend.  We discussed that if patient's functional, nutritional, or cognitive status does not improve then it would be reasonable to shift to focus on symptom management and comfort.  [name] and Jeani Hawking were in agreement for watchful waiting with no escalation or de-escalation of care at this time.   Code status discussed. [name] and Jeani Hawking confirmed DNR and DNI status as well as being against placement of a feeding tube or use of artifical means to sustain patient's life.   My recommendations remain to hold Haldol as family endorses this had caused patient increased agitation and delirium with his last dose 2 days ago.  Therapeutic silence and active listening provided for [name] and Jeani Hawking to share their houghts and emotions regarding current medical situation.  Emotional support provided.  Physical Exam Vitals reviewed.  Constitutional:      Appearance: Normal appearance.  HENT:     Head: Normocephalic.  Eyes:     Pupils: Pupils are equal, round, and reactive to light.  Cardiovascular:  Rate and Rhythm: Normal rate.     Pulses: Normal pulses.  Pulmonary:     Effort: Pulmonary effort is normal.  Abdominal:     Palpations: Abdomen is soft.  Musculoskeletal:     Comments: Generalize weakness  Skin:    General: Skin is warm and dry.  Neurological:     Mental Status: He is alert.     Comments: Does not verbalize appropriate answers to any orientation question             Palliative Assessment/Data: 20%    Total Time 50 minutes   Thank you for allowing the Palliative Medicine Team to assist  in the care of this patient.  Kenton Ilsa Iha, FNP-BC Palliative Medicine Team Team Phone # [phone]

## 2022-09-27 NOTE — Progress Notes (Addendum)
Rounding Note    Patient Name: Paul Terrell Date of Encounter: 09/27/2022  [place] Cardiologist: Sanda Klein, MD   Subjective   Pt is stable today overall with no improvement in mentation which again is complicated by hearing loss without hearing aids here. Unable to assess subjective symptoms.  Inpatient Medications    Scheduled Meds:  aspirin EC  81 mg Oral QHS   brexpiprazole  0.5 mg Oral QHS   clopidogrel  75 mg Oral Daily   enoxaparin (LOVENOX) injection  40 mg Subcutaneous Q24H   finasteride  5 mg Oral Daily   furosemide  20 mg Intravenous BID   guaiFENesin  600 mg Oral BID   insulin aspart  0-20 Units Subcutaneous TID WC   insulin aspart  0-5 Units Subcutaneous QHS   insulin aspart  5 Units Subcutaneous TID WC   insulin glargine-yfgn  25 Units Subcutaneous BID   levalbuterol  0.63 mg Nebulization TID   And   ipratropium  0.5 mg Nebulization TID   methylPREDNISolone (SOLU-MEDROL) injection  40 mg Intravenous Q12H   metoprolol tartrate  50 mg Oral BID   potassium chloride  40 mEq Oral Q4H   tamsulosin  0.4 mg Oral QHS   Continuous Infusions:  ampicillin-sulbactam (UNASYN) IV 3 g (09/27/22 0519)   azithromycin 500 mg (09/26/22 1710)   PRN Meds: acetaminophen **OR** acetaminophen, benzonatate, haloperidol lactate, melatonin   Vital Signs    Vitals:   09/27/22 0442 09/27/22 0643 09/27/22 0756 09/27/22 0804  BP: 128/73   119/61  Pulse: 94   70  Resp: 18   (!) 22  Temp: 97.7 F (36.5 C)   97.7 F (36.5 C)  TempSrc: Oral   Oral  SpO2: 100%  100% 100%  Weight:  74.3 kg    Height:        Intake/Output Summary (Last 24 hours) at 09/27/2022 0822 Last data filed at 09/27/2022 0448 Gross per 24 hour  Intake 276.53 ml  Output 950 ml  Net -673.47 ml      09/27/2022    6:43 AM 09/24/2022    5:00 AM 09/23/2022   11:41 PM  Last 3 Weights  Weight (lbs) 163 lb 12.8 oz 170 lb 10.2 oz 170 lb 10.2 oz  Weight (kg) 74.3 kg 77.4 kg 77.4 kg      Telemetry     NSR with occasional PVCs - Personally Reviewed  ECG    NSR with PVCs and NSST changes  - Personally Reviewed  Physical Exam   GEN: No acute distress.   Cardiac: RRR Respiratory: bilateral expiratory wheezes GI: Soft, nontender, non-distended  MS: No edema; No deformity. Neuro:   unable to answer most questions due to confusion and/or hearing loss, alert but couldn't answer orientating questions  Labs    High Sensitivity Troponin:   Recent Labs  Lab 09/24/22 1650 09/24/22 1811  TROPONINIHS 1,104* 891*     Chemistry Recent Labs  Lab 09/26/22 0025 09/27/22 0043 09/27/22 0530  NA 136 163* 144  K 2.7* 2.2* 3.2*  CL 100 116* 108  CO2 22 20* 26  GLUCOSE 133* 117* 152*  BUN 38* 36* 48*  CREATININE 1.28* 2.74* 1.21  CALCIUM 8.4* 5.7* 8.5*  MG 1.7 1.6* 2.4  PROT 6.4* 3.7* 6.1*  ALBUMIN 2.7* 1.7* 2.6*  AST 121* 44* 58*  ALT 103* 54* 80*  ALKPHOS 88 50 70  BILITOT 1.0 0.7 1.2  GFRNONAA 53* 21* 57*  ANIONGAP 14 27*  10    Lipids No results for input(s): "CHOL", "TRIG", "HDL", "LABVLDL", "LDLCALC", "CHOLHDL" in the last 168 hours.  Hematology Recent Labs  Lab 09/25/22 0256 09/26/22 0025 09/27/22 0043  WBC 15.4* 18.0* 11.5*  RBC 4.35 4.31 3.41*  HGB 14.7 13.8 11.3*  HCT 40.5 40.4 32.5*  MCV 93.1 93.7 95.3  MCH 33.8 32.0 33.1  MCHC 36.3* 34.2 34.8  RDW 13.6 13.6 14.1  PLT 180 238 197   Thyroid  Recent Labs  Lab 09/23/22 0425  TSH 0.710    BNP Recent Labs  Lab 09/23/22 0425 09/24/22 1651 09/25/22 0256  BNP 465.8* 491.8* 1,213.7*    DDimer No results for input(s): "DDIMER" in the last 168 hours.   Radiology    ECHOCARDIOGRAM COMPLETE  Result Date: 09/25/2022    ECHOCARDIOGRAM REPORT   Patient Name:   Paul Terrell [facility] Date of Exam: 09/25/2022 Medical Rec #:  [id number]   Height:       68.0 in Accession #:    [id number]  Weight:       170.6 lb Date of Birth:  September 27, 1930   BSA:          1.910 m Patient Age:    89 years    BP:           106/67 mmHg Patient  Gender: M           HR:           89 bpm. Exam Location:  Inpatient Procedure: 2D Echo and Intracardiac Opacification Agent Indications:    elevated troponin  History:        Patient has no prior history of Echocardiogram examinations.                 CAD; Risk Factors:Hypertension, Dyslipidemia and Diabetes.  Sonographer:    Harvie Junior Referring Phys: [id number] Paul Terrell  Sonographer Comments: Technically difficult study due to poor echo windows. Image acquisition challenging due to respiratory motion. IMPRESSIONS  1. Left ventricular ejection fraction, by estimation, is 30 to 35%. The left ventricle has moderately decreased function. The left ventricle demonstrates regional wall motion abnormalities (suggestive of LAD disease, no LV thrombus). Left ventricular diastolic parameters are consistent with Grade III diastolic dysfunction (restrictive).  2. Right ventricular systolic function was not well visualized. The right ventricular size is normal. Tricuspid regurgitation signal is inadequate for assessing PA pressure.  3. The mitral valve was not well visualized. No evidence of mitral valve regurgitation.  4. The aortic valve was not well visualized. Aortic valve regurgitation is not visualized.  5. The inferior vena cava is normal in size with greater than 50% respiratory variability, suggesting right atrial pressure of 3 mmHg. Comparison(s): No prior Echocardiogram. Conclusion(s)/Recommendation(s): Reaching out to primary team. FINDINGS  Left Ventricle: Left ventricular ejection fraction, by estimation, is 30 to 35%. The left ventricle has moderately decreased function. The left ventricle demonstrates regional wall motion abnormalities. The left ventricular internal cavity size was normal in size. There is no left ventricular hypertrophy. Left ventricular diastolic parameters are consistent with Grade III diastolic dysfunction (restrictive).  LV Wall Scoring: The mid and distal anterior septum, mid  inferoseptal segment, apical anterior segment, apical inferior segment, and apex are akinetic. The mid and distal lateral wall is hypokinetic. Right Ventricle: The right ventricular size is normal. No increase in right ventricular wall thickness. Right ventricular systolic function was not well visualized. Tricuspid regurgitation signal is inadequate for assessing PA pressure. Left Atrium: Left  atrial size was not well visualized. Right Atrium: Right atrial size was not well visualized. Pericardium: There is no evidence of pericardial effusion. Mitral Valve: The mitral valve was not well visualized. No evidence of mitral valve regurgitation. Tricuspid Valve: The tricuspid valve is not well visualized. Tricuspid valve regurgitation is not demonstrated. Aortic Valve: The aortic valve was not well visualized. Aortic valve regurgitation is not visualized. Pulmonic Valve: The pulmonic valve was not well visualized. Pulmonic valve regurgitation is not visualized. Aorta: The aortic root and ascending aorta are structurally normal, with no evidence of dilitation. Venous: The inferior vena cava is normal in size with greater than 50% respiratory variability, suggesting right atrial pressure of 3 mmHg. IAS/Shunts: The interatrial septum was not well visualized.  LEFT VENTRICLE PLAX 2D LVIDd:         4.30 cm      Diastology LVIDs:         3.40 cm      LV e' medial:    4.46 cm/s LV PW:         1.20 cm      LV E/e' medial:  27.4 LV IVS:        1.20 cm      LV e' lateral:   4.35 cm/s LVOT diam:     2.20 cm      LV E/e' lateral: 28.0 LV SV:         47 LV SV Index:   25 LVOT Area:     3.80 cm  LV Volumes (MOD) LV vol d, MOD A2C: 93.7 ml LV vol d, MOD A4C: 142.0 ml LV vol s, MOD A2C: 64.1 ml LV vol s, MOD A4C: 76.5 ml LV SV MOD A2C:     29.6 ml LV SV MOD A4C:     142.0 ml LV SV MOD BP:      56.1 ml RIGHT VENTRICLE RV S prime:     20.30 cm/s TAPSE (M-mode): 1.8 cm LEFT ATRIUM           Index LA diam:      3.50 cm 1.83 cm/m LA Vol  (A4C): 47.4 ml 24.81 ml/m  AORTIC VALVE             PULMONIC VALVE LVOT Vmax:   59.43 cm/s  PV Vmax:          0.80 m/s LVOT Vmean:  39.000 cm/s PV Peak grad:     2.6 mmHg LVOT VTI:    0.123 m     PR End Diast Vel: 4.33 msec  AORTA Ao Root diam: 3.90 cm Ao Asc diam:  3.80 cm MITRAL VALVE MV Area (PHT): 4.57 cm     SHUNTS MV Decel Time: 166 msec     Systemic VTI:  0.12 m MV E velocity: 122.00 cm/s  Systemic Diam: 2.20 cm MV A velocity: 34.30 cm/s MV E/A ratio:  3.56 Rudean Haskell MD Electronically signed by Rudean Haskell MD Signature Date/Time: 09/25/2022/12:05:33 PM    Final     Cardiac Studies   Echocardiogram 1/3  1. Left ventricular ejection fraction, by estimation, is 30 to 35%. The  left ventricle has moderately decreased function. The left ventricle  demonstrates regional wall motion abnormalities (suggestive of LAD  disease, no LV thrombus). Left ventricular  diastolic parameters are consistent with Grade III diastolic dysfunction  (restrictive).   2. Right ventricular systolic function was not well visualized. The right  ventricular size is normal. Tricuspid regurgitation signal is inadequate  for assessing PA pressure.   3. The mitral valve was not well visualized. No evidence of mitral valve  regurgitation.   4. The aortic valve was not well visualized. Aortic valve regurgitation  is not visualized.   5. The inferior vena cava is normal in size with greater than 50%  respiratory variability, suggesting right atrial pressure of 3 mmHg.   Comparison(s): No prior Echocardiogram.   Patient Profile     89 y.o. male with history of CAD w/ DES mid RCA 2001, HTN, AscAA, T2DM, BPH, and HLD admitted for acute hypoxemic respiratory failure 2/2 RSV and consulted for elevated troponin and new diagnosis of HFrEF.   Assessment & Plan    Acute on chronic combined systolic and diastolic heart failure  Type 2 MI CAD  - Patient has a remote history of CAD, had a DES to the mid RCA  in 2001. Admitted for treatment of severe sepsis, RSV, mild rhabdo, and acute hypoxemic respiratory failure. CXR on 1/2 showed diffuse increase in interstitial opacities, Left>right, consistent with pulmonary edema. BNP elevated to 1213, hsTn 1104>891. - Type 2 MI in the setting of sepsis and tachycardia with supply/demand mismatch  - Echocardiogram showed EF 30-35% with regional wall motion abnormalities  - Patient was started on IV heparin 1/2, stopped 1/4 - IV lasix 20 mg BID -- stable respiratory status with wheezing, negative 650 mL yesterday - Continue daily ASA, metoprolol 50 mg BID (Toprol 100 mg daily start tomorrow), and plavix for at least 1 year. Resume statin when recovered from acute illness/when ck and liver function improved  - BP stable and normotensive. Can add low dose ARB (losartan 25 mg daily) and transition to oral lasix - Not a good candidate for cath due to significant morbidity and mortality with his baseline comorbid conditions which are now complicated by acute illness. Palliative care involved with ongoing discussions with his son, set for another meeting this afternoon.   Otherwise per primary  - Severe sepsis due to RSV infection, possible aspiration pneumonia  - Acute encephalopathy, delirium  - Generalized weakness  - AKI (resolved)  - Goals of care     [place] will sign off.   Medication Recommendations:  Toprol 100 mg daily, aspirin 81 mg daily, clopidogrel 75 mg daily, losartan 25 mg daily Other recommendations (labs, testing, etc):  None Follow up as an outpatient:  As needed, per last office note planned f/u in 12/2022  For questions or updates, please contact [place] Please consult [email or web address] for contact info under        Signed, Johny Blamer, DO  09/27/2022, 8:22 AM   Internal Medicine Resident, PGY-1 Pager# [phone]  Personally seen and examined. Agree with above.  89 year old male with type II myocardial  infarction in the setting of sepsis and tachycardia, supply/demand mismatch with ejection fraction 30 to 35%.  Currently on beta-blocker.  Plavix for 1 year.  Awake but less responsive today to verbal stimuli.  He is not a candidate for invasive therapies.  Palliative care team discussion to further discuss goals of care.  Overall prognosis seems poor.  Please let us know if we can be of further assistance.  Candee Furbish, MD

## 2022-09-27 NOTE — Progress Notes (Signed)
Started to get out of bed, screaming. Made comfortable on bed , back rub given and repositioned. to sides. Haldol 2 mg iv given. Continue to monitor.

## 2022-09-27 NOTE — Progress Notes (Signed)
Critical lab results were notified to [place]. Redraw was sent. Awaiting results.
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
## 2022-09-27 NOTE — Progress Notes (Signed)
As per palliative notes, family doesn't want haldol to be given due to it makes him more agitated. MD made aware and claimed to stick on haldol since ativan is not good  for  delirium

## 2022-09-28 DIAGNOSIS — W19XXXA Unspecified fall, initial encounter: Secondary | ICD-10-CM | POA: Diagnosis not present

## 2022-09-28 DIAGNOSIS — G9341 Metabolic encephalopathy: Secondary | ICD-10-CM | POA: Diagnosis not present

## 2022-09-28 DIAGNOSIS — J9601 Acute respiratory failure with hypoxia: Secondary | ICD-10-CM | POA: Diagnosis not present

## 2022-09-28 DIAGNOSIS — I5043 Acute on chronic combined systolic (congestive) and diastolic (congestive) heart failure: Secondary | ICD-10-CM | POA: Diagnosis not present

## 2022-09-28 DIAGNOSIS — E1165 Type 2 diabetes mellitus with hyperglycemia: Secondary | ICD-10-CM

## 2022-09-28 LAB — GLUCOSE, CAPILLARY
Glucose-Capillary: 123 mg/dL — ABNORMAL HIGH (ref 70–99)
Glucose-Capillary: 122 mg/dL — ABNORMAL HIGH (ref 70–99)
Glucose-Capillary: 117 mg/dL — ABNORMAL HIGH (ref 70–99)
Glucose-Capillary: 134 mg/dL — ABNORMAL HIGH (ref 70–99)

## 2022-09-28 LAB — COMPREHENSIVE METABOLIC PANEL
ALT: 67 U/L — ABNORMAL HIGH (ref 0–44)
AST: 39 U/L (ref 15–41)
Albumin: 2.6 g/dL — ABNORMAL LOW (ref 3.5–5.0)
Alkaline Phosphatase: 69 U/L (ref 38–126)
Anion gap: 9 (ref 5–15)
BUN: 48 mg/dL — ABNORMAL HIGH (ref 8–23)
CO2: 25 mmol/L (ref 22–32)
Calcium: 8.4 mg/dL — ABNORMAL LOW (ref 8.9–10.3)
Chloride: 113 mmol/L — ABNORMAL HIGH (ref 98–111)
Creatinine, Ser: 1.24 mg/dL (ref 0.61–1.24)
GFR, Estimated: 56 mL/min — ABNORMAL LOW (ref >60)
Glucose, Bld: 153 mg/dL — ABNORMAL HIGH (ref 70–99)
Potassium: 3.7 mmol/L (ref 3.5–5.1)
Sodium: 147 mmol/L — ABNORMAL HIGH (ref 135–145)
Total Bilirubin: 0.9 mg/dL (ref 0.3–1.2)
Total Protein: 6 g/dL — ABNORMAL LOW (ref 6.5–8.1)

## 2022-09-28 LAB — MAGNESIUM: Magnesium: 2.2 mg/dL (ref 1.7–2.4)

## 2022-09-28 LAB — CBC
HCT: 41.8 % (ref 39.0–52.0)
Hemoglobin: 14.5 g/dL (ref 13.0–17.0)
MCH: 33.4 pg (ref 26.0–34.0)
MCHC: 34.7 g/dL (ref 30.0–36.0)
MCV: 96.3 fL (ref 80.0–100.0)
Platelets: 244 10*3/uL (ref 150–400)
RBC: 4.34 MIL/uL (ref 4.22–5.81)
RDW: 14.2 % (ref 11.5–15.5)
WBC: 13.3 10*3/uL — ABNORMAL HIGH (ref 4.0–10.5)
nRBC: 0 % (ref 0.0–0.2)

## 2022-09-28 LAB — BRAIN NATRIURETIC PEPTIDE: B Natriuretic Peptide: 2215.3 pg/mL — ABNORMAL HIGH (ref 0.0–100.0)

## 2022-09-28 MED ORDER — INSULIN GLARGINE-YFGN 100 UNIT/ML ~~LOC~~ SOLN
15.0000 [IU] | Freq: Two times a day (BID) | SUBCUTANEOUS | Status: DC
  Administered 2022-09-28 – 2022-09-29 (×2): 15 [IU] via SUBCUTANEOUS
  Filled 2022-09-28 (×3): qty 0.15

## 2022-09-28 MED ORDER — SODIUM CHLORIDE 0.9 % IV SOLN
2.0000 g | INTRAVENOUS | Status: AC
  Administered 2022-09-28 – 2022-09-29 (×2): 2 g via INTRAVENOUS
  Filled 2022-09-28 (×2): qty 20

## 2022-09-28 MED ORDER — FUROSEMIDE 20 MG PO TABS: 20.0000 mg | ORAL_TABLET | Freq: Every day | ORAL | Status: DC

## 2022-09-28 MED ORDER — SPIRONOLACTONE 25 MG PO TABS
25.0000 mg | ORAL_TABLET | Freq: Every day | ORAL | Status: DC
  Administered 2022-09-28: 25 mg via ORAL
  Filled 2022-09-28: qty 1

## 2022-09-28 MED ORDER — MORPHINE SULFATE (PF) 2 MG/ML IV SOLN
2.0000 mg | Freq: Once | INTRAVENOUS | Status: AC
  Administered 2022-09-29: 2 mg via INTRAVENOUS
  Filled 2022-09-28: qty 1

## 2022-09-28 NOTE — Progress Notes (Addendum)
PROGRESS NOTE  Paul Terrell WHQ:[id number] DOB: 1931/09/12   PCP: Tonia Ghent, MD  Patient is from: Home.   DOA: 09/11/2022 LOS: 5  Chief complaints Chief Complaint  Patient presents with   Fall     Brief Narrative / Interim history: 89 year old M with PMH of CAD, poorly controlled DM-2, HLD, HTN, BPH and hard of hearing presenting with generalized weakness and multiple ground-level falls at home, and admitted for sepsis and acute respiratory failure with hypoxia in the setting of RSV infection, AKI, acute metabolic encephalopathy and mild rhabdomyolysis. Cr 1.33.  CK elevated to 1000.  CT head without acute finding.  CXR revealed stable cardiomegaly with mild left basilar linear atelectasis.  RSV positive.  Patient developed acute respiratory distress with acute respiratory failure with hypoxia and hypercapnia, acute respiratory acidosis and tachycardia the evening of 1/2.  ABG with acute respiratory acidosis and hypercapnia.  CXR showed diffuse increase in interstitial opacities, left> right suggesting pulmonary edema.  BNP elevated to 1200.  Troponin elevated to 1100>> 900.  Patient was started on BiPAP, IV Lasix, IV heparin, Solu-Medrol and nebulizers.  On 1/3, TTE showed LVEF of 30 to 35%, RWMA, G3-DD.  Cardiology gave recommendation and signed off.  Respiratory failure resolving but remains delirious requiring pharmacologic and soft restraints.  Therapy recommended SNF.  Palliative medicine following.  Subjective: Seen and examined earlier this morning.  No major events overnight of this morning.  Per RN, he was trying to climb out of the bed earlier this morning.  He was sleepy and appeared calm during my evaluation.  Still with bilateral wrist and abdominal restraints.  Weaned oxygen to 1 L and he maintained saturation in upper 90s to 100.  Objective: Vitals:   09/28/22 0800 09/28/22 0918 09/28/22 1000 09/28/22 1115  BP:   (!) 139/93 126/83  Pulse:   83 66  Resp:   (!) 34  (!) 28  Temp:    98.5 F (36.9 C)  TempSrc:    Axillary  SpO2: 98% 98% 100% 95%  Weight:      Height:        Examination:  GENERAL: No apparent distress.  Nontoxic. HEENT: MMM.  Vision grossly intact.  Hard of hearing. NECK: Supple.  No apparent JVD.  RESP:  No IWOB.  Fair aeration bilaterally. CVS:  RRR. Heart sounds normal.  ABD/GI/GU: BS+. Abd soft, NTND.  Abdominal restraints. MSK/EXT:  Moves extremities.  Bilateral wrist restraints. SKIN: no apparent skin lesion or wound NEURO: Sleepy but wakes to voice.  Disoriented.  Does not follow commands.  No apparent focal neuro deficit. PSYCH: Calm and somewhat sleepy and disoriented.  Procedures:  None  Microbiology summarized: Blood cultures NGTD.  Assessment and plan: Principal Problem:   Acute respiratory failure with hypoxia and hypercapnia (HCC) Active Problems:   DM2 (diabetes mellitus, type 2) (HCC)   Hyperlipidemia   CAD (coronary artery disease)   BPH (benign prostatic hyperplasia)   Essential hypertension   Acute metabolic encephalopathy   RSV (respiratory syncytial virus infection)   Generalized weakness   Fall at home, initial encounter   AKI (acute kidney injury) ([place])   Hypokalemia   Severe sepsis (HCC)   Sinus tachycardia   Delirium   Goals of care, counseling/discussion   Acute on chronic combined systolic and diastolic CHF (congestive heart failure) ([place])   CAP (community acquired pneumonia)   Type 2 MI (myocardial infarction) ([place])  Acute hypoxic respiratory failure with hypoxia and hypercapnia: Due to  RSV, CHF, non-STEMI and bacterial pneumonia.  Breathing improved.  Respiratory failure resolving. -Treat treatable causes. -Discontinue IV Solu-Medrol after today's dose.  Could make delirium worse -Change IV Unasyn to IV ceftriaxone given hypernatremia. -Completed course of azithromycin. -Hold Lasix today  Non-STEMI: Troponin elevated to 1100>> 900.  EKG with nonspecific ST-T wave changes.  TTE  with LVEF of 30 to 35% and RWMA. -Appreciate input by cardiology  -Not a candidate for catheterization.  Recommended medical management as below  -Metoprolol 50 mg twice daily today>> Toprol 100 mg daily starting 1/6.  -Low-dose losartan  -Continue aspirin and Plavix -Resume home Pravachol.  Has history of statin intolerance -Cardiology signed off.  Acute on chronic combined CHF: TTE with LVEF of 30 to 35%, RWMA and G3 DD.  Exacerbation could be from IV fluid.   CXR suggests pulmonary edema.  Started on IV Lasix.  I and O incomplete but breathing and respiratory failure improved tremendously.  Cr stable.  However, BNP trended up to 2200 may be suggesting poor prognosis. -Hold IV Lasix today -GDMT-Toprol-XL 100 mg daily, low-dose losartan.  Added Aldactone 25 mg in the setting of hyponatremia. -Strict intake and output, daily weight and renal functions.  Severe sepsis due to RSV infection and bacterial pneumonia.  CXR as above.  Pro-Cal elevated. -Management as above.  Sinus tachycardia: Resolved. -Continue metoprolol as above  Acute encephalopathy/delirium: Remains confused and agitated at times.  Trying to climb out of the bed.  I do not think Haldol is contributing to his delirium.  May be steroid.  -Discontinue Solu-Medrol since respiratory failure has resolved. -Reorientation and delirium precautions -Continue soft restraints-abdominal and bilateral wrist.  -IV Haldol 2 mg every 6 hours as needed -Appreciate help by palliative medicine   Generalized weakness: -PT/OT-recommended SNF.   Acute kidney injury: Resolved. Recent Labs    05/06/22 0903 09/06/22 0839 09/06/2022 2200 09/23/22 0425 09/24/22 0033 09/25/22 0256 09/26/22 0025 09/27/22 0043 09/27/22 0530 09/28/22 0029  BUN '18 17 20 20 21 '$ 34* 38* 36* 48* 48*  CREATININE 1.12 1.13 1.46* 1.33* 1.17 1.23 1.28* 2.74* 1.21 1.24  -Continue monitoring    IDDM-2 with hyperglycemia: A1c 8.0% on 09/06/2022.  Hyperglycemia  likely due to steroid. Recent Labs  Lab 09/27/22 1149 09/27/22 1709 09/27/22 2157 09/28/22 0638 09/28/22 1116  GLUCAP 179* 135* 144* 123* 122*  -Continue SSI-resume statin scale. -Decrease Semglee from 25 to 15 units twice daily now he is off steroid. -Continue NovoLog 5 units 3 times daily with meals -Further adjustment as appropriate  Mild hyponatremia: Na 147. -Change IV Unasyn to ceftriaxone -Hold IV Lasix and start Aldactone -Recheck in the morning   BPH: Without LUTS -Continue current meds. -Monitor urine output  Hypokalemia/hypomagnesemia -Monitor replenish as appropriate  Elevated LFT: Slightly up.  Resolving. -Continue monitoring   Essential hypertension: Normotensive. -Cardiac meds as above.   Hyperlipidemia: History of statin intolerance but takes Pravachol at home. -Resume home Pravachol.  Mild rhabdomyolysis: Resolved.  Goal of care counseling: DNR/DNI.  -Palliative medicine following.  Body mass index is 24.4 kg/m.          DVT prophylaxis:  enoxaparin (LOVENOX) injection 40 mg Start: 09/26/22 2200 SCDs Start: 09/23/22 0224  Code Status: DNR/DNI Family Communication: None at bedside.  Level of care: Progressive Status is: Inpatient Remains inpatient appropriate because: Acute respiratory failure, pneumonia, non-STEMI, acute CHF, delirium   Final disposition: SNF Consultants:  Cardiology Palliative medicine  Sch Meds:  Scheduled Meds:  aspirin EC  81 mg  Oral QHS   brexpiprazole  0.5 mg Oral QHS   clopidogrel  75 mg Oral Daily   enoxaparin (LOVENOX) injection  40 mg Subcutaneous Q24H   finasteride  5 mg Oral Daily   [START ON 09/29/2022] furosemide  20 mg Oral Daily   guaiFENesin  600 mg Oral BID   insulin aspart  0-20 Units Subcutaneous TID WC   insulin aspart  0-5 Units Subcutaneous QHS   insulin aspart  5 Units Subcutaneous TID WC   insulin glargine-yfgn  15 Units Subcutaneous BID   levalbuterol  0.63 mg Nebulization TID   And    ipratropium  0.5 mg Nebulization TID   losartan  25 mg Oral Daily   metoprolol succinate  100 mg Oral Daily   pravastatin  40 mg Oral q1800   spironolactone  25 mg Oral Daily   tamsulosin  0.4 mg Oral QHS   Continuous Infusions:  cefTRIAXone (ROCEPHIN)  IV 2 g (09/28/22 1024)   PRN Meds:.acetaminophen **OR** acetaminophen, benzonatate, haloperidol lactate, melatonin  Antimicrobials: Anti-infectives (From admission, onward)    Start     Dose/Rate Route Frequency Ordered Stop   09/28/22 1100  cefTRIAXone (ROCEPHIN) 2 g in sodium chloride 0.9 % 100 mL IVPB        2 g 200 mL/hr over 30 Minutes Intravenous Every 24 hours 09/28/22 0745 09/30/22 1059   09/25/22 1545  azithromycin (ZITHROMAX) 500 mg in sodium chloride 0.9 % 250 mL IVPB        500 mg 250 mL/hr over 60 Minutes Intravenous Every 24 hours 09/25/22 1447 09/27/22 1600   09/24/22 1800  Ampicillin-Sulbactam (UNASYN) 3 g in sodium chloride 0.9 % 100 mL IVPB  Status:  Discontinued        3 g 200 mL/hr over 30 Minutes Intravenous Every 6 hours 09/24/22 1658 09/28/22 0745        I have personally reviewed the following labs and images: CBC: Recent Labs  Lab 09/15/2022 2200 09/23/22 0425 09/24/22 0033 09/25/22 0256 09/26/22 0025 09/27/22 0043 09/28/22 0029  WBC 12.7* 12.5* 15.1* 15.4* 18.0* 11.5* 13.3*  NEUTROABS 10.3* 8.7* 11.8* 14.4*  --   --   --   HGB 15.2 13.7 14.0 14.7 13.8 11.3* 14.5  HCT 42.1 37.6* 39.3 40.5 40.4 32.5* 41.8  MCV 93.3 93.8 94.7 93.1 93.7 95.3 96.3  PLT 180 160 157 180 238 197 244   BMP &GFR Recent Labs  Lab 09/23/22 0425 09/24/22 0033 09/25/22 0256 09/26/22 0025 09/27/22 0043 09/27/22 0530 09/28/22 0029  NA 133* 135 134* 136 163* 144 147*  K 3.4* 3.4* 3.9 2.7* 2.2* 3.2* 3.7  CL 94* 98 100 100 116* 108 113*  CO2 28 25 20* 22 20* 26 25  GLUCOSE 244* 211* 346* 133* 117* 152* 153*  BUN 20 21 34* 38* 36* 48* 48*  CREATININE 1.33* 1.17 1.23 1.28* 2.74* 1.21 1.24  CALCIUM 8.1* 8.5* 8.3*  8.4* 5.7* 8.5* 8.4*  MG 1.7 1.8 1.8 1.7 1.6* 2.4 2.2  PHOS 2.5 1.5* 3.6 2.4* 2.1*  --   --    Estimated Creatinine Clearance: 39.1 mL/min (by C-G formula based on SCr of 1.24 mg/dL). Liver & Pancreas: Recent Labs  Lab 09/25/22 0256 09/26/22 0025 09/27/22 0043 09/27/22 0530 09/28/22 0029  AST 116* 121* 44* 58* 39  ALT 97* 103* 54* 80* 67*  ALKPHOS 78 88 50 70 69  BILITOT 1.0 1.0 0.7 1.2 0.9  PROT 6.2* 6.4* 3.7* 6.1* 6.0*  ALBUMIN 2.6*  2.7* 1.7* 2.6* 2.6*   No results for input(s): "LIPASE", "AMYLASE" in the last 168 hours. Recent Labs  Lab 09/25/22 0256 09/27/22 0838  AMMONIA 26 25   Diabetic: No results for input(s): "HGBA1C" in the last 72 hours. Recent Labs  Lab 09/27/22 1149 09/27/22 1709 09/27/22 2157 09/28/22 0638 09/28/22 1116  GLUCAP 179* 135* 144* 123* 122*   Cardiac Enzymes: Recent Labs  Lab 09/13/2022 2200 09/23/22 0425 09/24/22 0033 09/25/22 0256 09/27/22 0043  CKTOTAL 753* 1,001* 1,061* 504* 127   No results for input(s): "PROBNP" in the last 8760 hours. Coagulation Profile: Recent Labs  Lab 09/23/22 0425  INR 1.2   Thyroid Function Tests: No results for input(s): "TSH", "T4TOTAL", "FREET4", "T3FREE", "THYROIDAB" in the last 72 hours.  Lipid Profile: No results for input(s): "CHOL", "HDL", "LDLCALC", "TRIG", "CHOLHDL", "LDLDIRECT" in the last 72 hours. Anemia Panel: No results for input(s): "VITAMINB12", "FOLATE", "FERRITIN", "TIBC", "IRON", "RETICCTPCT" in the last 72 hours. Urine analysis:    Component Value Date/Time   COLORURINE YELLOW 09/23/2022 0055   APPEARANCEUR HAZY (A) 09/23/2022 0055   LABSPEC 1.018 09/23/2022 0055   PHURINE 5.0 09/23/2022 0055   GLUCOSEU >=500 (A) 09/23/2022 0055   HGBUR SMALL (A) 09/23/2022 0055   BILIRUBINUR NEGATIVE 09/23/2022 0055   KETONESUR 5 (A) 09/23/2022 0055   PROTEINUR 100 (A) 09/23/2022 0055   NITRITE NEGATIVE 09/23/2022 0055   LEUKOCYTESUR NEGATIVE 09/23/2022 0055   Sepsis Labs: Invalid  input(s): "PROCALCITONIN", "LACTICIDVEN"  Microbiology: Recent Results (from the past 240 hour(s))  Resp panel by RT-PCR (RSV, Flu A&B, Covid) Anterior Nasal Swab     Status: Abnormal   Collection Time: 09/21/22 10:56 AM   Specimen: Anterior Nasal Swab  Result Value Ref Range Status   SARS Coronavirus 2 by RT PCR NEGATIVE NEGATIVE Final    Comment: (NOTE) SARS-CoV-2 target nucleic acids are NOT DETECTED.  The SARS-CoV-2 RNA is generally detectable in upper respiratory specimens during the acute phase of infection. The lowest concentration of SARS-CoV-2 viral copies this assay can detect is 138 copies/mL. A negative result does not preclude SARS-Cov-2 infection and should not be used as the sole basis for treatment or other patient management decisions. A negative result may occur with  improper specimen collection/handling, submission of specimen other than nasopharyngeal swab, presence of viral mutation(s) within the areas targeted by this assay, and inadequate number of viral copies(<138 copies/mL). A negative result must be combined with clinical observations, patient history, and epidemiological information. The expected result is Negative.  Fact Sheet for Patients:  EntrepreneurPulse.com.au  Fact Sheet for Healthcare Providers:  IncredibleEmployment.be  This test is no t yet approved or cleared by the [place] FDA and  has been authorized for detection and/or diagnosis of SARS-CoV-2 by FDA under an Emergency Use Authorization (EUA). This EUA will remain  in effect (meaning this test can be used) for the duration of the COVID-19 declaration under Section 564(b)(1) of the Act, 21 U.S.C.section 360bbb-3(b)(1), unless the authorization is terminated  or revoked sooner.       Influenza A by PCR NEGATIVE NEGATIVE Final   Influenza B by PCR NEGATIVE NEGATIVE Final    Comment: (NOTE) The Xpert Xpress SARS-CoV-2/FLU/RSV plus assay is  intended as an aid in the diagnosis of influenza from Nasopharyngeal swab specimens and should not be used as a sole basis for treatment. Nasal washings and aspirates are unacceptable for Xpert Xpress SARS-CoV-2/FLU/RSV testing.  Fact Sheet for Patients: EntrepreneurPulse.com.au  Fact Sheet for Healthcare Providers:  IncredibleEmployment.be  This test is not yet approved or cleared by the [place] and has been authorized for detection and/or diagnosis of SARS-CoV-2 by FDA under an Emergency Use Authorization (EUA). This EUA will remain in effect (meaning this test can be used) for the duration of the COVID-19 declaration under Section 564(b)(1) of the Act, 21 U.S.C. section 360bbb-3(b)(1), unless the authorization is terminated or revoked.     Resp Syncytial Virus by PCR POSITIVE (A) NEGATIVE Final    Comment: (NOTE) Fact Sheet for Patients: EntrepreneurPulse.com.au  Fact Sheet for Healthcare Providers: IncredibleEmployment.be  This test is not yet approved or cleared by the [place] FDA and has been authorized for detection and/or diagnosis of SARS-CoV-2 by FDA under an Emergency Use Authorization (EUA). This EUA will remain in effect (meaning this test can be used) for the duration of the COVID-19 declaration under Section 564(b)(1) of the Act, 21 U.S.C. section 360bbb-3(b)(1), unless the authorization is terminated or revoked.  Performed at [facility], [address], [place], [place] [address]   Blood culture (routine x 2)     Status: None   Collection Time: 08/28/2022  9:34 PM   Specimen: BLOOD  Result Value Ref Range Status   Specimen Description BLOOD LEFT ANTECUBITAL  Final   Special Requests   Final    BOTTLES DRAWN AEROBIC AND ANAEROBIC Blood Culture results may not be optimal due to an excessive volume of blood received in culture bottles   Culture   Final     NO GROWTH 5 DAYS Performed at [facility] [address], [place], [place] [address]    Report Status 09/27/2022 FINAL  Final  Culture, blood (Routine X 2) w Reflex to ID Panel     Status: None (Preliminary result)   Collection Time: 09/23/22  5:30 PM   Specimen: BLOOD  Result Value Ref Range Status   Specimen Description BLOOD BLOOD LEFT FOREARM  Final   Special Requests   Final    BOTTLES DRAWN AEROBIC AND ANAEROBIC Blood Culture results may not be optimal due to an excessive volume of blood received in culture bottles   Culture   Final    NO GROWTH 4 DAYS Performed at [facility], [place] [address], [place], [place] [address]    Report Status PENDING  Incomplete  Culture, blood (Routine X 2) w Reflex to ID Panel     Status: None (Preliminary result)   Collection Time: 09/23/22  5:56 PM   Specimen: BLOOD  Result Value Ref Range Status   Specimen Description BLOOD LEFT ANTECUBITAL  Final   Special Requests   Final    BOTTLES DRAWN AEROBIC AND ANAEROBIC Blood Culture results may not be optimal due to an inadequate volume of blood received in culture bottles   Culture   Final    NO GROWTH 4 DAYS Performed at [facility], [place] [address] [place], [place] [address]    Report Status PENDING  Incomplete  MRSA Next Gen by PCR, Nasal     Status: None   Collection Time: 09/23/22 11:43 PM   Specimen: Nasal Mucosa; Nasal Swab  Result Value Ref Range Status   MRSA by PCR Next Gen NOT DETECTED NOT DETECTED Final    Comment: (NOTE) The GeneXpert MRSA Assay (FDA approved for NASAL specimens only), is one component of a comprehensive MRSA colonization surveillance program. It is not intended to diagnose MRSA infection nor to guide or monitor treatment  for MRSA infections. Test performance is not FDA approved in patients less than 40 years old. Performed at [facility], [place] [address], [place], [place] [address]     Radiology Studies: No results  found.    Reah Justo T. Bogue Chitto  If 7PM-7AM, please contact night-coverage [email or web address] 09/28/2022, 11:59 AM

## 2022-09-28 NOTE — Progress Notes (Signed)
Started to be getting out of bed, pulling out gown. Made comfortable on bed, back rub given  , washed face  and oral  care done. Due meds given with apple sauce tolerated well. Went back to sleep continue to monitor.

## 2022-09-28 NOTE — Progress Notes (Signed)
Palliative:  Chart reviewed.  No acute PMT needs identified. Provider that is familiar with patient will follow up on Monday per family's wishes to allow time to see how patient does over weekend.   Juel Burrow, DNP, AGNP-C Palliative Medicine Team Team Phone # [phone]  Pager # [phone]  NO CHARGE

## 2022-09-28 NOTE — Progress Notes (Signed)
Screaming on top of his  lungs, trying to get out of bed. Repositioned for comfort. Haldol given. Son at bedside  continue to monitor.

## 2022-09-29 DIAGNOSIS — Z515 Encounter for palliative care: Secondary | ICD-10-CM

## 2022-09-29 DIAGNOSIS — Z7189 Other specified counseling: Secondary | ICD-10-CM | POA: Diagnosis not present

## 2022-09-29 DIAGNOSIS — A419 Sepsis, unspecified organism: Secondary | ICD-10-CM | POA: Diagnosis not present

## 2022-09-29 DIAGNOSIS — I5043 Acute on chronic combined systolic (congestive) and diastolic (congestive) heart failure: Secondary | ICD-10-CM | POA: Diagnosis not present

## 2022-09-29 DIAGNOSIS — G9341 Metabolic encephalopathy: Secondary | ICD-10-CM | POA: Diagnosis not present

## 2022-09-29 DIAGNOSIS — W19XXXA Unspecified fall, initial encounter: Secondary | ICD-10-CM | POA: Diagnosis not present

## 2022-09-29 DIAGNOSIS — J9601 Acute respiratory failure with hypoxia: Secondary | ICD-10-CM | POA: Diagnosis not present

## 2022-09-29 LAB — COMPREHENSIVE METABOLIC PANEL
ALT: 73 U/L — ABNORMAL HIGH (ref 0–44)
AST: 50 U/L — ABNORMAL HIGH (ref 15–41)
Albumin: 2.8 g/dL — ABNORMAL LOW (ref 3.5–5.0)
Alkaline Phosphatase: 69 U/L (ref 38–126)
Anion gap: 12 (ref 5–15)
BUN: 49 mg/dL — ABNORMAL HIGH (ref 8–23)
CO2: 24 mmol/L (ref 22–32)
Calcium: 8.5 mg/dL — ABNORMAL LOW (ref 8.9–10.3)
Chloride: 114 mmol/L — ABNORMAL HIGH (ref 98–111)
Creatinine, Ser: 1.3 mg/dL — ABNORMAL HIGH (ref 0.61–1.24)
GFR, Estimated: 53 mL/min — ABNORMAL LOW (ref >60)
Glucose, Bld: 154 mg/dL — ABNORMAL HIGH (ref 70–99)
Potassium: 3.6 mmol/L (ref 3.5–5.1)
Sodium: 150 mmol/L — ABNORMAL HIGH (ref 135–145)
Total Bilirubin: 1.1 mg/dL (ref 0.3–1.2)
Total Protein: 6.2 g/dL — ABNORMAL LOW (ref 6.5–8.1)

## 2022-09-29 LAB — CULTURE, BLOOD (ROUTINE X 2)
Culture: NO GROWTH
Culture: NO GROWTH

## 2022-09-29 LAB — CBC
HCT: 43.1 % (ref 39.0–52.0)
Hemoglobin: 14.6 g/dL (ref 13.0–17.0)
MCH: 33 pg (ref 26.0–34.0)
MCHC: 33.9 g/dL (ref 30.0–36.0)
MCV: 97.3 fL (ref 80.0–100.0)
Platelets: 283 10*3/uL (ref 150–400)
RBC: 4.43 MIL/uL (ref 4.22–5.81)
RDW: 14.4 % (ref 11.5–15.5)
WBC: 12.1 10*3/uL — ABNORMAL HIGH (ref 4.0–10.5)
nRBC: 0 % (ref 0.0–0.2)

## 2022-09-29 LAB — GLUCOSE, CAPILLARY
Glucose-Capillary: 96 mg/dL (ref 70–99)
Glucose-Capillary: 122 mg/dL — ABNORMAL HIGH (ref 70–99)

## 2022-09-29 LAB — BRAIN NATRIURETIC PEPTIDE: B Natriuretic Peptide: 2922 pg/mL — ABNORMAL HIGH (ref 0.0–100.0)

## 2022-09-29 LAB — MAGNESIUM: Magnesium: 2.3 mg/dL (ref 1.7–2.4)

## 2022-09-29 LAB — PHOSPHORUS: Phosphorus: 3.7 mg/dL (ref 2.5–4.6)

## 2022-09-29 MED ORDER — HALOPERIDOL 0.5 MG PO TABS: 0.5000 mg | ORAL_TABLET | ORAL | Status: DC | PRN

## 2022-09-29 MED ORDER — GLYCOPYRROLATE 1 MG PO TABS: 1.0000 mg | ORAL_TABLET | ORAL | Status: DC | PRN

## 2022-09-29 MED ORDER — ACETAMINOPHEN 325 MG PO TABS: 650.0000 mg | ORAL_TABLET | Freq: Four times a day (QID) | ORAL | Status: DC | PRN

## 2022-09-29 MED ORDER — DEXTROSE 5 % IV SOLN: INTRAVENOUS | Status: DC

## 2022-09-29 MED ORDER — GLYCOPYRROLATE 0.2 MG/ML IJ SOLN: 0.2000 mg | INTRAMUSCULAR | Status: DC | PRN

## 2022-09-29 MED ORDER — CHLORHEXIDINE GLUCONATE CLOTH 2 % EX PADS
6.0000 | MEDICATED_PAD | Freq: Every day | CUTANEOUS | Status: DC
  Administered 2022-09-29: 6 via TOPICAL

## 2022-09-29 MED ORDER — LOSARTAN POTASSIUM 25 MG PO TABS: 25.0000 mg | ORAL_TABLET | Freq: Every day | ORAL | Status: DC

## 2022-09-29 MED ORDER — LORAZEPAM 2 MG/ML IJ SOLN
1.0000 mg | INTRAMUSCULAR | Status: DC | PRN
  Administered 2022-09-29 – 2022-10-01 (×4): 1 mg via INTRAVENOUS
  Filled 2022-09-29 (×3): qty 1

## 2022-09-29 MED ORDER — ACETAMINOPHEN 650 MG RE SUPP: 650.0000 mg | Freq: Four times a day (QID) | RECTAL | Status: DC | PRN

## 2022-09-29 MED ORDER — HYDROMORPHONE HCL 1 MG/ML IJ SOLN
1.0000 mg | INTRAMUSCULAR | Status: DC | PRN
  Administered 2022-09-29 – 2022-10-01 (×8): 1 mg via INTRAVENOUS
  Filled 2022-09-29 (×9): qty 1

## 2022-09-29 MED ORDER — HALOPERIDOL LACTATE 2 MG/ML PO CONC: 0.5000 mg | ORAL | Status: DC | PRN

## 2022-09-29 MED ORDER — LORAZEPAM 1 MG PO TABS: 1.0000 mg | ORAL_TABLET | ORAL | Status: DC | PRN

## 2022-09-29 MED ORDER — LORAZEPAM 2 MG/ML IJ SOLN
INTRAMUSCULAR | Status: AC
  Filled 2022-09-29: qty 1

## 2022-09-29 MED ORDER — ONDANSETRON 4 MG PO TBDP: 4.0000 mg | ORAL_TABLET | Freq: Four times a day (QID) | ORAL | Status: DC | PRN

## 2022-09-29 MED ORDER — HYDROMORPHONE HCL 1 MG/ML IJ SOLN
0.5000 mg | INTRAMUSCULAR | Status: DC | PRN
  Administered 2022-09-29: 0.5 mg via INTRAVENOUS
  Filled 2022-09-29: qty 0.5

## 2022-09-29 MED ORDER — ONDANSETRON HCL 4 MG/2ML IJ SOLN: 4.0000 mg | Freq: Four times a day (QID) | INTRAMUSCULAR | Status: DC | PRN

## 2022-09-29 MED ORDER — ACETAMINOPHEN 650 MG RE SUPP
650.0000 mg | Freq: Four times a day (QID) | RECTAL | Status: DC
  Administered 2022-09-29: 650 mg via RECTAL
  Filled 2022-09-29: qty 1

## 2022-09-29 MED ORDER — POLYVINYL ALCOHOL 1.4 % OP SOLN: 1.0000 [drp] | Freq: Four times a day (QID) | OPHTHALMIC | Status: DC | PRN

## 2022-09-29 MED ORDER — ACETAMINOPHEN 325 MG PO TABS: 650.0000 mg | ORAL_TABLET | Freq: Four times a day (QID) | ORAL | Status: DC

## 2022-09-29 MED ORDER — HALOPERIDOL LACTATE 5 MG/ML IJ SOLN
0.5000 mg | INTRAMUSCULAR | Status: DC | PRN
  Administered 2022-09-30 – 2022-10-01 (×3): 0.5 mg via INTRAVENOUS
  Filled 2022-09-29 (×3): qty 1

## 2022-09-29 MED ORDER — FUROSEMIDE 20 MG PO TABS: 20.0000 mg | ORAL_TABLET | Freq: Every day | ORAL | Status: DC

## 2022-09-29 MED ORDER — LORAZEPAM 2 MG/ML PO CONC: 1.0000 mg | ORAL | Status: DC | PRN

## 2022-09-29 NOTE — Progress Notes (Signed)
Daily Progress Note   Patient Name: Paul Terrell       Date: 09/29/2022 DOB: 10/04/1930  Age: 89 y.o. MRN#: [id number] Attending Physician: Mercy Riding, MD Primary Care Physician: Tonia Ghent, MD Admit Date: 09/16/2022  Reason for Consultation/Follow-up: Establishing goals of care  Patient Profile/HPI:  89 y.o. male  with past medical history of type 2 diabetes, HLD, HTN, BPH, and hard of hearing admitted on 09/17/2022 with weakness and multiple ground-level falls at home.  Patient has been diagnosed with acute metabolic encephalopathy, sepsis, acute respiratory failure, tested positive for RSV, AKI, and mild rhabdomyolysis.  On 1/2, patient experienced respiratory distress.  On 1/3, patient had TTE which revealed LVEF of 30 to 35%, RWMA, and G3-DD.  Patient is receiving IV antibiotics and has had intermittent use of restraints due to agitation.   PMT was consulted to discuss goals of care.   Subjective: Chart reviewed including labs, progress notes, imaging from this and previous encounters.  Patient worsening overnight. Moaning, very uncomfortable. BNP continues to rise.  Son at bedside. They are requesting to transition to full comfort measures only.  Discussed transition to comfort measures only which includes stopping IV fluids, antibiotics, labs and providing symptom management for SOB, anxiety, nausea, vomiting, and other symptoms of dying.  They are in agreement.     Review of Systems  Unable to perform ROS: Mental status change     Physical Exam Vitals and nursing note reviewed.  Pulmonary:     Comments: Increased effort Neurological:     Comments: Unresponsive, moaning at times             Vital Signs: BP 119/71 (BP Location: Left Arm)   Pulse 63   Temp 98.1 F  (36.7 C) (Oral)   Resp (!) 22   Ht '5\' 8"'$  (1.727 m)   Wt 71.2 kg   SpO2 100%   BMI 23.87 kg/m  SpO2: SpO2: 100 % O2 Device: O2 Device: Room Air O2 Flow Rate: O2 Flow Rate (L/min): 2 L/min  Intake/output summary:  Intake/Output Summary (Last 24 hours) at 09/29/2022 1219 Last data filed at 09/29/2022 1124 Gross per 24 hour  Intake 318.22 ml  Output 1300 ml  Net -981.78 ml   LBM: Last BM Date : 09/24/22 Baseline Weight: Weight: 82 kg Most  recent weight: Weight: 71.2 kg       Palliative Assessment/Data: PPS: 10%      Patient Active Problem List   Diagnosis Date Noted   Type 2 MI (myocardial infarction) ([place]) 09/26/2022   Acute on chronic combined systolic and diastolic CHF (congestive heart failure) ([place]) 09/25/2022   CAP (community acquired pneumonia) 09/25/2022   Sinus tachycardia 09/24/2022   Delirium 09/24/2022   Goals of care, counseling/discussion 09/24/2022   Acute respiratory failure with hypoxia and hypercapnia ([place]) 09/23/2022   RSV (respiratory syncytial virus infection) 09/23/2022   Generalized weakness 09/23/2022   Fall at home, initial encounter 09/23/2022   AKI (acute kidney injury) ([place]) 09/23/2022   Hypokalemia 09/23/2022   Severe sepsis ([place]) 09/23/2022   Skin lesion 05/08/2022   Trochanteric bursitis 05/08/2022   Yellow jacket sting 05/08/2022   Hearing loss 02/19/2021   Risk for falls 11/22/2020   Memory change 11/22/2020   Compression fracture of L1 lumbar vertebra ([place]) 05/08/2020   Lower back pain 04/23/2020   Aortic aneurysm ([place]) 65/68/1275   Acute metabolic encephalopathy 17/00/1749   GERD (gastroesophageal reflux disease)    Thrombocytopenia ([place])    Anemia 05/14/2018   Essential hypertension 05/14/2018   Unsteadiness on feet 11/05/2017   Spinal stenosis of lumbar region 11/08/2015   Advance care planning 11/04/2014   Leg pain 01/30/2014   Medicare annual wellness visit, subsequent 11/01/2013   BPH (benign prostatic hyperplasia)     DM2 (diabetes mellitus, type 2) ([place])    Hyperlipidemia    Hypertensive heart disease    CAD (coronary artery disease)     Palliative Care Assessment & Plan    Assessment/Recommendations/Plan  Transition to full comfort measures 0.'5mg'$  hydromorphone IV q2hr prn SOB, air hunger Lorazepam '1mg'$  po, SL or IV q4 hr anxiety Haldol .'5mg'$  po, SL or IV q4 hr PRN agitation Glycopyrrolate .'2mg'$  IV q4hr secretions    Code Status: DNR  Prognosis:  Hours - Days  Discharge Planning: Anticipated Hospital Death  Care plan was discussed with patient's family and care team.   Thank you for allowing the Palliative Medicine Team to assist in the care of this patient.     Greater than 50%  of this time was spent counseling and coordinating care related to the above assessment and plan.  Mariana Kaufman, AGNP-C Palliative Medicine   Please contact Palliative Medicine Team phone at [phone] for questions and concerns.

## 2022-09-29 NOTE — Progress Notes (Signed)
   09/28/22 2010  Provider Notification  Provider Name/Title Dr. Vallarie Mare  Date Provider Notified 09/28/22  Time Provider Notified 2350  Method of Notification Page  Notification Reason Change in status (patient moaning loudly in discomfort)  Provider response See new orders  Date of Provider Response 09/29/22  Time of Provider Response [id number]

## 2022-09-29 NOTE — Progress Notes (Signed)
Pt transferred to 6N-[id number].  Pt resting comfortably.  Family updated on pt transfer.

## 2022-09-29 NOTE — Progress Notes (Signed)
PROGRESS NOTE  Paul Terrell OVF:[id number] DOB: 19-Mar-1931   PCP: Tonia Ghent, MD  Patient is from: Home.   DOA: 09/01/2022 LOS: 6  Chief complaints Chief Complaint  Patient presents with   Fall     Brief Narrative / Interim history: 89 year old M with PMH of CAD, poorly controlled DM-2, HLD, HTN, BPH and hard of hearing presenting with generalized weakness and multiple ground-level falls at home, and admitted for sepsis and acute respiratory failure with hypoxia in the setting of RSV infection, AKI, acute metabolic encephalopathy and mild rhabdomyolysis. Cr 1.33.  CK elevated to 1000.  CT head without acute finding.  CXR revealed stable cardiomegaly with mild left basilar linear atelectasis.  RSV positive.  Patient developed acute respiratory distress with acute respiratory failure with hypoxia and hypercapnia, acute respiratory acidosis and tachycardia the evening of 1/2.  ABG with acute respiratory acidosis and hypercapnia.  CXR showed diffuse increase in interstitial opacities, left> right suggesting pulmonary edema.  BNP elevated to 1200.  Troponin elevated to 1100>> 900.  Patient was started on BiPAP, IV Lasix, IV heparin, Solu-Medrol and nebulizers.  On 1/3, TTE showed LVEF of 30 to 35%, RWMA, G3-DD.  Cardiology gave recommendation and signed off.  Respiratory failure resolved but patient remained delirious requiring pharmacologic and physical restraints.  Palliative medicine consulted.  Eventually, patient was transitioned to full comfort care on 09/29/2022.  Subjective: Seen and examined earlier this morning.  Patient was moaning and crying.  Not able to verbalize or answer questions.  He is trying to lift his back off the bed.  Seems to be in pain and distress.  Objective: Vitals:   09/29/22 0340 09/29/22 0500 09/29/22 0806 09/29/22 1121  BP: (!) 133/94  (!) 122/105 119/71  Pulse: 91  88 63  Resp: 20  17 (!) 22  Temp: 97.6 F (36.4 C)  97.6 F (36.4 C) 98.1 F (36.7  C)  TempSrc: Axillary  Oral Oral  SpO2: 91%  99% 100%  Weight:  71.2 kg    Height:        Examination:  GENERAL: Moaning and crying.  Restless. HEENT: MMM.  Vision grossly intact.  Hard of hearing. NECK: Supple.  No apparent JVD.  RESP: 100% on RA.  No IWOB.  Fair aeration bilaterally. CVS:  RRR. Heart sounds normal.  ABD/GI/GU: BS+. Abd soft, NTND.  Abdominal restraints. MSK/EXT:  Moves extremities.  Bilateral wrist restraints. SKIN: no apparent skin lesion or wound NEURO: Awake and alert but not able to answer orientation questions.  Does not follow command.  PSYCH: Restless, moaning and crying.  Procedures:  None  Microbiology summarized: Blood cultures NGTD.  Assessment and plan: Principal Problem:   Acute respiratory failure with hypoxia and hypercapnia (HCC) Active Problems:   DM2 (diabetes mellitus, type 2) (HCC)   Hyperlipidemia   CAD (coronary artery disease)   BPH (benign prostatic hyperplasia)   Essential hypertension   Acute metabolic encephalopathy   RSV (respiratory syncytial virus infection)   Generalized weakness   Fall at home, initial encounter   AKI (acute kidney injury) ([place])   Hypokalemia   Severe sepsis (HCC)   Sinus tachycardia   Delirium   Goals of care, counseling/discussion   Acute on chronic combined systolic and diastolic CHF (congestive heart failure) ([place])   CAP (community acquired pneumonia)   Type 2 MI (myocardial infarction) ([place])   End of life care   End-of-life care/DNR/DNI: Patient with worsening delirium now is in a lot of  distress.  -Appreciate help by palliative medicine-full comfort care.   Acute hypoxic respiratory failure with hypoxia and hypercapnia due to RSV, CHF, non-STEMI and bacterial pneumonia.  Respiratory failure resolved.  Non-STEMI: Troponin elevated to 1100>> 900.  EKG with nonspecific ST-T wave changes.  TTE with LVEF of 30 to 35% and RWMA.  Evaluated by cardiology.  Not a candidate for  catheterization.  Acute on chronic combined CHF: TTE with LVEF of 30 to 35%, RWMA and G3 DD.  Exacerbation could be from IV fluid.   CXR suggests pulmonary edema.  Diuresed with IV Lasix and respiratory failure resolved.Marland Kitchen  However, BNP trended up from 1200-3000 suggesting poor prognosis.  Severe sepsis due to RSV infection and bacterial pneumonia.  CXR as above.  Pro-Cal elevated.  Resolved.  Sinus tachycardia: Resolved.  Acute encephalopathy/delirium: Worsening delirium requiring pharmacologic and physical restraints for 3 days    Generalized weakness:   Acute kidney injury: Resolved.    IDDM-2 with hyperglycemia: A1c 8.0% on 09/06/2022.   Hypernatremia  BPH with LUTS: Had some urinary retention -Foley catheter placed.  Hypokalemia/hypomagnesemia  Elevated LFT: Improving.   Essential hypertension: Normotensive.   Hyperlipidemia:  Mild rhabdomyolysis: Resolved.   Body mass index is 23.87 kg/m.          DVT prophylaxis:  Patient is full comfort care  Code Status: DNR/DNI Family Communication: Updated patient's son at bedside  Level of care: Palliative Care Status is: Inpatient Remains inpatient appropriate because: Anticipate in hospital death   Final disposition: Anticipate in hospital death Consultants:  Cardiology Palliative medicine  Sch Meds:  Scheduled Meds:  acetaminophen  650 mg Oral Q6H WA   Or   acetaminophen  650 mg Rectal Q6H WA   Continuous Infusions:   PRN Meds:.acetaminophen **OR** acetaminophen, glycopyrrolate **OR** glycopyrrolate **OR** glycopyrrolate, haloperidol **OR** haloperidol **OR** haloperidol lactate, HYDROmorphone (DILAUDID) injection, LORazepam **OR** LORazepam **OR** LORazepam, ondansetron **OR** ondansetron (ZOFRAN) IV, polyvinyl alcohol  Antimicrobials: Anti-infectives (From admission, onward)    Start     Dose/Rate Route Frequency Ordered Stop   09/28/22 1100  cefTRIAXone (ROCEPHIN) 2 g in sodium chloride 0.9 % 100  mL IVPB        2 g 200 mL/hr over 30 Minutes Intravenous Every 24 hours 09/28/22 0745 09/29/22 1127   09/25/22 1545  azithromycin (ZITHROMAX) 500 mg in sodium chloride 0.9 % 250 mL IVPB        500 mg 250 mL/hr over 60 Minutes Intravenous Every 24 hours 09/25/22 1447 09/27/22 1600   09/24/22 1800  Ampicillin-Sulbactam (UNASYN) 3 g in sodium chloride 0.9 % 100 mL IVPB  Status:  Discontinued        3 g 200 mL/hr over 30 Minutes Intravenous Every 6 hours 09/24/22 1658 09/28/22 0745        I have personally reviewed the following labs and images: CBC: Recent Labs  Lab 09/03/2022 2200 09/23/22 0425 09/24/22 0033 09/25/22 0256 09/26/22 0025 09/27/22 0043 09/28/22 0029 09/29/22 0020  WBC 12.7* 12.5* 15.1* 15.4* 18.0* 11.5* 13.3* 12.1*  NEUTROABS 10.3* 8.7* 11.8* 14.4*  --   --   --   --   HGB 15.2 13.7 14.0 14.7 13.8 11.3* 14.5 14.6  HCT 42.1 37.6* 39.3 40.5 40.4 32.5* 41.8 43.1  MCV 93.3 93.8 94.7 93.1 93.7 95.3 96.3 97.3  PLT 180 160 157 180 238 197 244 283   BMP &GFR Recent Labs  Lab 09/24/22 0033 09/25/22 0256 09/26/22 0025 09/27/22 0043 09/27/22 0530 09/28/22 0029 09/29/22 0020  NA 135 134* 136 163* 144 147* 150*  K 3.4* 3.9 2.7* 2.2* 3.2* 3.7 3.6  CL 98 100 100 116* 108 113* 114*  CO2 25 20* 22 20* '26 25 24  '$ GLUCOSE 211* 346* 133* 117* 152* 153* 154*  BUN 21 34* 38* 36* 48* 48* 49*  CREATININE 1.17 1.23 1.28* 2.74* 1.21 1.24 1.30*  CALCIUM 8.5* 8.3* 8.4* 5.7* 8.5* 8.4* 8.5*  MG 1.8 1.8 1.7 1.6* 2.4 2.2 2.3  PHOS 1.5* 3.6 2.4* 2.1*  --   --  3.7   Estimated Creatinine Clearance: 37.3 mL/min (A) (by C-G formula based on SCr of 1.3 mg/dL (H)). Liver & Pancreas: Recent Labs  Lab 09/26/22 0025 09/27/22 0043 09/27/22 0530 09/28/22 0029 09/29/22 0020  AST 121* 44* 58* 39 50*  ALT 103* 54* 80* 67* 73*  ALKPHOS 88 50 70 69 69  BILITOT 1.0 0.7 1.2 0.9 1.1  PROT 6.4* 3.7* 6.1* 6.0* 6.2*  ALBUMIN 2.7* 1.7* 2.6* 2.6* 2.8*   No results for input(s): "LIPASE",  "AMYLASE" in the last 168 hours. Recent Labs  Lab 09/25/22 0256 09/27/22 0838  AMMONIA 26 25   Diabetic: No results for input(s): "HGBA1C" in the last 72 hours. Recent Labs  Lab 09/28/22 1116 09/28/22 1531 09/28/22 2130 09/29/22 0607 09/29/22 1111  GLUCAP 122* 117* 134* 96 122*   Cardiac Enzymes: Recent Labs  Lab 09/16/2022 2200 09/23/22 0425 09/24/22 0033 09/25/22 0256 09/27/22 0043  CKTOTAL 753* 1,001* 1,061* 504* 127   No results for input(s): "PROBNP" in the last 8760 hours. Coagulation Profile: Recent Labs  Lab 09/23/22 0425  INR 1.2   Thyroid Function Tests: No results for input(s): "TSH", "T4TOTAL", "FREET4", "T3FREE", "THYROIDAB" in the last 72 hours.  Lipid Profile: No results for input(s): "CHOL", "HDL", "LDLCALC", "TRIG", "CHOLHDL", "LDLDIRECT" in the last 72 hours. Anemia Panel: No results for input(s): "VITAMINB12", "FOLATE", "FERRITIN", "TIBC", "IRON", "RETICCTPCT" in the last 72 hours. Urine analysis:    Component Value Date/Time   COLORURINE YELLOW 09/23/2022 0055   APPEARANCEUR HAZY (A) 09/23/2022 0055   LABSPEC 1.018 09/23/2022 0055   PHURINE 5.0 09/23/2022 0055   GLUCOSEU >=500 (A) 09/23/2022 0055   HGBUR SMALL (A) 09/23/2022 0055   BILIRUBINUR NEGATIVE 09/23/2022 0055   KETONESUR 5 (A) 09/23/2022 0055   PROTEINUR 100 (A) 09/23/2022 0055   NITRITE NEGATIVE 09/23/2022 0055   LEUKOCYTESUR NEGATIVE 09/23/2022 0055   Sepsis Labs: Invalid input(s): "PROCALCITONIN", "LACTICIDVEN"  Microbiology: Recent Results (from the past 240 hour(s))  Resp panel by RT-PCR (RSV, Flu A&B, Covid) Anterior Nasal Swab     Status: Abnormal   Collection Time: 09/21/22 10:56 AM   Specimen: Anterior Nasal Swab  Result Value Ref Range Status   SARS Coronavirus 2 by RT PCR NEGATIVE NEGATIVE Final    Comment: (NOTE) SARS-CoV-2 target nucleic acids are NOT DETECTED.  The SARS-CoV-2 RNA is generally detectable in upper respiratory specimens during the acute phase  of infection. The lowest concentration of SARS-CoV-2 viral copies this assay can detect is 138 copies/mL. A negative result does not preclude SARS-Cov-2 infection and should not be used as the sole basis for treatment or other patient management decisions. A negative result may occur with  improper specimen collection/handling, submission of specimen other than nasopharyngeal swab, presence of viral mutation(s) within the areas targeted by this assay, and inadequate number of viral copies(<138 copies/mL). A negative result must be combined with clinical observations, patient history, and epidemiological information. The expected result is Negative.  Fact  Sheet for Patients:  EntrepreneurPulse.com.au  Fact Sheet for Healthcare Providers:  IncredibleEmployment.be  This test is no t yet approved or cleared by the [place] FDA and  has been authorized for detection and/or diagnosis of SARS-CoV-2 by FDA under an Emergency Use Authorization (EUA). This EUA will remain  in effect (meaning this test can be used) for the duration of the COVID-19 declaration under Section 564(b)(1) of the Act, 21 U.S.C.section 360bbb-3(b)(1), unless the authorization is terminated  or revoked sooner.       Influenza A by PCR NEGATIVE NEGATIVE Final   Influenza B by PCR NEGATIVE NEGATIVE Final    Comment: (NOTE) The Xpert Xpress SARS-CoV-2/FLU/RSV plus assay is intended as an aid in the diagnosis of influenza from Nasopharyngeal swab specimens and should not be used as a sole basis for treatment. Nasal washings and aspirates are unacceptable for Xpert Xpress SARS-CoV-2/FLU/RSV testing.  Fact Sheet for Patients: EntrepreneurPulse.com.au  Fact Sheet for Healthcare Providers: IncredibleEmployment.be  This test is not yet approved or cleared by the [place] FDA and has been authorized for detection and/or diagnosis of  SARS-CoV-2 by FDA under an Emergency Use Authorization (EUA). This EUA will remain in effect (meaning this test can be used) for the duration of the COVID-19 declaration under Section 564(b)(1) of the Act, 21 U.S.C. section 360bbb-3(b)(1), unless the authorization is terminated or revoked.     Resp Syncytial Virus by PCR POSITIVE (A) NEGATIVE Final    Comment: (NOTE) Fact Sheet for Patients: EntrepreneurPulse.com.au  Fact Sheet for Healthcare Providers: IncredibleEmployment.be  This test is not yet approved or cleared by the [place] FDA and has been authorized for detection and/or diagnosis of SARS-CoV-2 by FDA under an Emergency Use Authorization (EUA). This EUA will remain in effect (meaning this test can be used) for the duration of the COVID-19 declaration under Section 564(b)(1) of the Act, 21 U.S.C. section 360bbb-3(b)(1), unless the authorization is terminated or revoked.  Performed at [facility], [address] [place], [place] [address]   Blood culture (routine x 2)     Status: None   Collection Time: 09/13/2022  9:34 PM   Specimen: BLOOD  Result Value Ref Range Status   Specimen Description BLOOD LEFT ANTECUBITAL  Final   Special Requests   Final    BOTTLES DRAWN AEROBIC AND ANAEROBIC Blood Culture results may not be optimal due to an excessive volume of blood received in culture bottles   Culture   Final    NO GROWTH 5 DAYS Performed at [facility] [address] [place], [place] [address]    Report Status 09/27/2022 FINAL  Final  Culture, blood (Routine X 2) w Reflex to ID Panel     Status: None (Preliminary result)   Collection Time: 09/23/22  5:30 PM   Specimen: BLOOD  Result Value Ref Range Status   Specimen Description BLOOD BLOOD LEFT FOREARM  Final   Special Requests   Final    BOTTLES DRAWN AEROBIC AND ANAEROBIC Blood Culture results may not be optimal due to an excessive volume of  blood received in culture bottles   Culture   Final    NO GROWTH 4 DAYS Performed at [facility], [place] [address] [place], [place] [address]    Report Status PENDING  Incomplete  Culture, blood (Routine X 2) w Reflex to ID Panel     Status: None (Preliminary result)   Collection Time: 09/23/22  5:56 PM   Specimen:  BLOOD  Result Value Ref Range Status   Specimen Description BLOOD LEFT ANTECUBITAL  Final   Special Requests   Final    BOTTLES DRAWN AEROBIC AND ANAEROBIC Blood Culture results may not be optimal due to an inadequate volume of blood received in culture bottles   Culture   Final    NO GROWTH 4 DAYS Performed at [facility] [address] [place], [place] [address]    Report Status PENDING  Incomplete  MRSA Next Gen by PCR, Nasal     Status: None   Collection Time: 09/23/22 11:43 PM   Specimen: Nasal Mucosa; Nasal Swab  Result Value Ref Range Status   MRSA by PCR Next Gen NOT DETECTED NOT DETECTED Final    Comment: (NOTE) The GeneXpert MRSA Assay (FDA approved for NASAL specimens only), is one component of a comprehensive MRSA colonization surveillance program. It is not intended to diagnose MRSA infection nor to guide or monitor treatment for MRSA infections. Test performance is not FDA approved in patients less than 66 years old. Performed at [facility], [place] [address] [place],  [address]     Radiology Studies: No results found.    Genessis Flanary T. [place]  If 7PM-7AM, please contact night-coverage [email or web address] 09/29/2022, 3:18 PM

## 2022-09-30 DIAGNOSIS — J9601 Acute respiratory failure with hypoxia: Secondary | ICD-10-CM | POA: Diagnosis not present

## 2022-09-30 DIAGNOSIS — B338 Other specified viral diseases: Secondary | ICD-10-CM | POA: Diagnosis not present

## 2022-09-30 DIAGNOSIS — W19XXXA Unspecified fall, initial encounter: Secondary | ICD-10-CM | POA: Diagnosis not present

## 2022-09-30 DIAGNOSIS — G9341 Metabolic encephalopathy: Secondary | ICD-10-CM | POA: Diagnosis not present

## 2022-09-30 DIAGNOSIS — Z515 Encounter for palliative care: Secondary | ICD-10-CM | POA: Diagnosis not present

## 2022-09-30 DIAGNOSIS — I5043 Acute on chronic combined systolic (congestive) and diastolic (congestive) heart failure: Secondary | ICD-10-CM | POA: Diagnosis not present

## 2022-09-30 NOTE — Progress Notes (Signed)
PROGRESS NOTE  Paul Terrell EGB:[id number] DOB: 02/22/1931   PCP: Tonia Ghent, MD  Patient is from: Home.   DOA: 09/03/2022 LOS: 7  Chief complaints Chief Complaint  Patient presents with   Fall     Brief Narrative / Interim history: 89 year old M with PMH of CAD, poorly controlled DM-2, HLD, HTN, BPH and hard of hearing presenting with generalized weakness and multiple ground-level falls at home, and admitted for sepsis and acute respiratory failure with hypoxia in the setting of RSV infection, AKI, acute metabolic encephalopathy and mild rhabdomyolysis. Cr 1.33.  CK elevated to 1000.  CT head without acute finding.  CXR revealed stable cardiomegaly with mild left basilar linear atelectasis.  RSV positive.  Patient developed acute respiratory distress with acute respiratory failure with hypoxia and hypercapnia, acute respiratory acidosis and tachycardia the evening of 1/2.  ABG with acute respiratory acidosis and hypercapnia.  CXR showed diffuse increase in interstitial opacities, left> right suggesting pulmonary edema.  BNP elevated to 1200.  Troponin elevated to 1100>> 900.  Patient was started on BiPAP, IV Lasix, IV heparin, Solu-Medrol and nebulizers.  On 1/3, TTE showed LVEF of 30 to 35%, RWMA, G3-DD.  Cardiology gave recommendation and signed off.  Respiratory failure resolved but patient remained delirious requiring pharmacologic and physical restraints.  Palliative medicine consulted.  Eventually, patient was transitioned to full comfort care on 09/29/2022.  Subjective: Seen and examined earlier this morning.  No major events overnight of this morning.  Patient is somewhat sedated.  No apparent distress.  Objective: Vitals:   09/29/22 0806 09/29/22 1121 09/30/22 0344 09/30/22 1546  BP: (!) 122/105 119/71 (!) 127/98 133/67  Pulse: 88 63 90 (!) 107  Resp: 17 (!) '22 18 20  '$ Temp: 97.6 F (36.4 C) 98.1 F (36.7 C) 97.6 F (36.4 C) (!) 97.5 F (36.4 C)  TempSrc: Oral Oral  Oral Oral  SpO2: 99% 100% 92% 93%  Weight:      Height:        Examination: GENERAL: No apparent distress.  Somewhat sedated. RESP: On room air. CVS:  RRR. Heart sounds normal.  ABD/GI/GU: BS+. Abd soft, NTND.  MSK/EXT:  Moves extremities. No apparent deformity. No edema.  SKIN: no apparent skin lesion or wound NEURO: Somewhat sedated.  No apparent focal neuro deficit. PSYCH: Calm.  No distress or agitation.   Procedures:  None  Microbiology summarized: Blood cultures NGTD.  Assessment and plan: Principal Problem:   Acute respiratory failure with hypoxia and hypercapnia (HCC) Active Problems:   DM2 (diabetes mellitus, type 2) (HCC)   Hyperlipidemia   CAD (coronary artery disease)   BPH (benign prostatic hyperplasia)   Essential hypertension   Acute metabolic encephalopathy   RSV (respiratory syncytial virus infection)   Generalized weakness   Fall at home, initial encounter   AKI (acute kidney injury) ([place])   Hypokalemia   Severe sepsis (HCC)   Sinus tachycardia   Delirium   Goals of care, counseling/discussion   Acute on chronic combined systolic and diastolic CHF (congestive heart failure) ([place])   CAP (community acquired pneumonia)   Type 2 MI (myocardial infarction) ([place])   End of life care   End-of-life care/DNR/DNI: Patient with worsening delirium now is in a lot of distress.  -Appreciate help by palliative medicine-full comfort care. -Stable for transfer to residential hospice -[facility] consulted.   Acute hypoxic respiratory failure with hypoxia and hypercapnia due to RSV, CHF, non-STEMI and bacterial pneumonia.  Respiratory failure resolved.  Non-STEMI:  Troponin elevated to 1100>> 900.  EKG with nonspecific ST-T wave changes.  TTE with LVEF of 30 to 35% and RWMA.  Evaluated by cardiology.  Not a candidate for catheterization.  Acute on chronic combined CHF: TTE with LVEF of 30 to 35%, RWMA and G3 DD.  Exacerbation could be from IV fluid.   CXR suggests  pulmonary edema.  Diuresed with IV Lasix and respiratory failure resolved.Marland Kitchen  However, BNP trended up from 1200-3000 suggesting poor prognosis.  Severe sepsis due to RSV infection and bacterial pneumonia.  CXR as above.  Pro-Cal elevated.  Resolved.  Sinus tachycardia: Resolved.  Acute encephalopathy/delirium: Worsening delirium requiring pharmacologic and physical restraints for 3 days    Generalized weakness:   Acute kidney injury: Resolved.    IDDM-2 with hyperglycemia: A1c 8.0% on 09/06/2022.   Hypernatremia  BPH with LUTS: Had some urinary retention -Foley catheter placed.  Hypokalemia/hypomagnesemia  Elevated LFT: Improving.   Essential hypertension: Normotensive.   Hyperlipidemia:  Mild rhabdomyolysis: Resolved.   Body mass index is 23.87 kg/m.          DVT prophylaxis:  Patient is full comfort care  Code Status: DNR/DNI Family Communication: None at bedside  Level of care: Palliative Care Status is: Inpatient Remains inpatient appropriate because: Residential hospice.   Final disposition: Residential hospice Consultants:  Cardiology Palliative medicine  Sch Meds:  Scheduled Meds:  acetaminophen  650 mg Oral Q6H WA   Or   acetaminophen  650 mg Rectal Q6H WA   Continuous Infusions:   PRN Meds:.acetaminophen **OR** acetaminophen, glycopyrrolate **OR** glycopyrrolate **OR** glycopyrrolate, haloperidol **OR** haloperidol **OR** haloperidol lactate, HYDROmorphone (DILAUDID) injection, LORazepam **OR** LORazepam **OR** LORazepam, ondansetron **OR** ondansetron (ZOFRAN) IV, polyvinyl alcohol  Antimicrobials: Anti-infectives (From admission, onward)    Start     Dose/Rate Route Frequency Ordered Stop   09/28/22 1100  cefTRIAXone (ROCEPHIN) 2 g in sodium chloride 0.9 % 100 mL IVPB        2 g 200 mL/hr over 30 Minutes Intravenous Every 24 hours 09/28/22 0745 09/29/22 1127   09/25/22 1545  azithromycin (ZITHROMAX) 500 mg in sodium chloride 0.9 % 250  mL IVPB        500 mg 250 mL/hr over 60 Minutes Intravenous Every 24 hours 09/25/22 1447 09/27/22 1600   09/24/22 1800  Ampicillin-Sulbactam (UNASYN) 3 g in sodium chloride 0.9 % 100 mL IVPB  Status:  Discontinued        3 g 200 mL/hr over 30 Minutes Intravenous Every 6 hours 09/24/22 1658 09/28/22 0745        I have personally reviewed the following labs and images: CBC: Recent Labs  Lab 09/24/22 0033 09/25/22 0256 09/26/22 0025 09/27/22 0043 09/28/22 0029 09/29/22 0020  WBC 15.1* 15.4* 18.0* 11.5* 13.3* 12.1*  NEUTROABS 11.8* 14.4*  --   --   --   --   HGB 14.0 14.7 13.8 11.3* 14.5 14.6  HCT 39.3 40.5 40.4 32.5* 41.8 43.1  MCV 94.7 93.1 93.7 95.3 96.3 97.3  PLT 157 180 238 197 244 283   BMP &GFR Recent Labs  Lab 09/24/22 0033 09/25/22 0256 09/26/22 0025 09/27/22 0043 09/27/22 0530 09/28/22 0029 09/29/22 0020  NA 135 134* 136 163* 144 147* 150*  K 3.4* 3.9 2.7* 2.2* 3.2* 3.7 3.6  CL 98 100 100 116* 108 113* 114*  CO2 25 20* 22 20* '26 25 24  '$ GLUCOSE 211* 346* 133* 117* 152* 153* 154*  BUN 21 34* 38* 36* 48* 48* 49*  CREATININE 1.17 1.23 1.28* 2.74* 1.21 1.24 1.30*  CALCIUM 8.5* 8.3* 8.4* 5.7* 8.5* 8.4* 8.5*  MG 1.8 1.8 1.7 1.6* 2.4 2.2 2.3  PHOS 1.5* 3.6 2.4* 2.1*  --   --  3.7   Estimated Creatinine Clearance: 37.3 mL/min (A) (by C-G formula based on SCr of 1.3 mg/dL (H)). Liver & Pancreas: Recent Labs  Lab 09/26/22 0025 09/27/22 0043 09/27/22 0530 09/28/22 0029 09/29/22 0020  AST 121* 44* 58* 39 50*  ALT 103* 54* 80* 67* 73*  ALKPHOS 88 50 70 69 69  BILITOT 1.0 0.7 1.2 0.9 1.1  PROT 6.4* 3.7* 6.1* 6.0* 6.2*  ALBUMIN 2.7* 1.7* 2.6* 2.6* 2.8*   No results for input(s): "LIPASE", "AMYLASE" in the last 168 hours. Recent Labs  Lab 09/25/22 0256 09/27/22 0838  AMMONIA 26 25   Diabetic: No results for input(s): "HGBA1C" in the last 72 hours. Recent Labs  Lab 09/28/22 1116 09/28/22 1531 09/28/22 2130 09/29/22 0607 09/29/22 1111  GLUCAP 122*  117* 134* 96 122*   Cardiac Enzymes: Recent Labs  Lab 09/24/22 0033 09/25/22 0256 09/27/22 0043  CKTOTAL 1,061* 504* 127   No results for input(s): "PROBNP" in the last 8760 hours. Coagulation Profile: No results for input(s): "INR", "PROTIME" in the last 168 hours.  Thyroid Function Tests: No results for input(s): "TSH", "T4TOTAL", "FREET4", "T3FREE", "THYROIDAB" in the last 72 hours.  Lipid Profile: No results for input(s): "CHOL", "HDL", "LDLCALC", "TRIG", "CHOLHDL", "LDLDIRECT" in the last 72 hours. Anemia Panel: No results for input(s): "VITAMINB12", "FOLATE", "FERRITIN", "TIBC", "IRON", "RETICCTPCT" in the last 72 hours. Urine analysis:    Component Value Date/Time   COLORURINE YELLOW 09/23/2022 0055   APPEARANCEUR HAZY (A) 09/23/2022 0055   LABSPEC 1.018 09/23/2022 0055   PHURINE 5.0 09/23/2022 0055   GLUCOSEU >=500 (A) 09/23/2022 0055   HGBUR SMALL (A) 09/23/2022 0055   BILIRUBINUR NEGATIVE 09/23/2022 0055   KETONESUR 5 (A) 09/23/2022 0055   PROTEINUR 100 (A) 09/23/2022 0055   NITRITE NEGATIVE 09/23/2022 0055   LEUKOCYTESUR NEGATIVE 09/23/2022 0055   Sepsis Labs: Invalid input(s): "PROCALCITONIN", "LACTICIDVEN"  Microbiology: Recent Results (from the past 240 hour(s))  Resp panel by RT-PCR (RSV, Flu A&B, Covid) Anterior Nasal Swab     Status: Abnormal   Collection Time: 09/21/22 10:56 AM   Specimen: Anterior Nasal Swab  Result Value Ref Range Status   SARS Coronavirus 2 by RT PCR NEGATIVE NEGATIVE Final    Comment: (NOTE) SARS-CoV-2 target nucleic acids are NOT DETECTED.  The SARS-CoV-2 RNA is generally detectable in upper respiratory specimens during the acute phase of infection. The lowest concentration of SARS-CoV-2 viral copies this assay can detect is 138 copies/mL. A negative result does not preclude SARS-Cov-2 infection and should not be used as the sole basis for treatment or other patient management decisions. A negative result may occur with   improper specimen collection/handling, submission of specimen other than nasopharyngeal swab, presence of viral mutation(s) within the areas targeted by this assay, and inadequate number of viral copies(<138 copies/mL). A negative result must be combined with clinical observations, patient history, and epidemiological information. The expected result is Negative.  Fact Sheet for Patients:  EntrepreneurPulse.com.au  Fact Sheet for Healthcare Providers:  IncredibleEmployment.be  This test is no t yet approved or cleared by the [place] FDA and  has been authorized for detection and/or diagnosis of SARS-CoV-2 by FDA under an Emergency Use Authorization (EUA). This EUA will remain  in effect (meaning this test can be  used) for the duration of the COVID-19 declaration under Section 564(b)(1) of the Act, 21 U.S.C.section 360bbb-3(b)(1), unless the authorization is terminated  or revoked sooner.       Influenza A by PCR NEGATIVE NEGATIVE Final   Influenza B by PCR NEGATIVE NEGATIVE Final    Comment: (NOTE) The Xpert Xpress SARS-CoV-2/FLU/RSV plus assay is intended as an aid in the diagnosis of influenza from Nasopharyngeal swab specimens and should not be used as a sole basis for treatment. Nasal washings and aspirates are unacceptable for Xpert Xpress SARS-CoV-2/FLU/RSV testing.  Fact Sheet for Patients: EntrepreneurPulse.com.au  Fact Sheet for Healthcare Providers: IncredibleEmployment.be  This test is not yet approved or cleared by the [place] FDA and has been authorized for detection and/or diagnosis of SARS-CoV-2 by FDA under an Emergency Use Authorization (EUA). This EUA will remain in effect (meaning this test can be used) for the duration of the COVID-19 declaration under Section 564(b)(1) of the Act, 21 U.S.C. section 360bbb-3(b)(1), unless the authorization is terminated or revoked.      Resp Syncytial Virus by PCR POSITIVE (A) NEGATIVE Final    Comment: (NOTE) Fact Sheet for Patients: EntrepreneurPulse.com.au  Fact Sheet for Healthcare Providers: IncredibleEmployment.be  This test is not yet approved or cleared by the [place] FDA and has been authorized for detection and/or diagnosis of SARS-CoV-2 by FDA under an Emergency Use Authorization (EUA). This EUA will remain in effect (meaning this test can be used) for the duration of the COVID-19 declaration under Section 564(b)(1) of the Act, 21 U.S.C. section 360bbb-3(b)(1), unless the authorization is terminated or revoked.  Performed at [facility], [address] [place], [place] [address]   Blood culture (routine x 2)     Status: None   Collection Time: 09/15/2022  9:34 PM   Specimen: BLOOD  Result Value Ref Range Status   Specimen Description BLOOD LEFT ANTECUBITAL  Final   Special Requests   Final    BOTTLES DRAWN AEROBIC AND ANAEROBIC Blood Culture results may not be optimal due to an excessive volume of blood received in culture bottles   Culture   Final    NO GROWTH 5 DAYS Performed at [facility] [address] [place], [place] [address]    Report Status 09/27/2022 FINAL  Final  Culture, blood (Routine X 2) w Reflex to ID Panel     Status: None   Collection Time: 09/23/22  5:30 PM   Specimen: BLOOD  Result Value Ref Range Status   Specimen Description BLOOD BLOOD LEFT FOREARM  Final   Special Requests   Final    BOTTLES DRAWN AEROBIC AND ANAEROBIC Blood Culture results may not be optimal due to an excessive volume of blood received in culture bottles   Culture   Final    NO GROWTH 6 DAYS Performed at [facility], [place] [address], [place], [place] [address]    Report Status 09/29/2022 FINAL  Final  Culture, blood (Routine X 2) w Reflex to ID Panel     Status: None   Collection Time: 09/23/22  5:56 PM   Specimen: BLOOD   Result Value Ref Range Status   Specimen Description BLOOD LEFT ANTECUBITAL  Final   Special Requests   Final    BOTTLES DRAWN AEROBIC AND ANAEROBIC Blood Culture results may not be optimal due to an inadequate volume of blood received in culture bottles   Culture   Final    NO GROWTH 6 DAYS  Performed at [facility], [place] [address], [place], [place] [address]    Report Status 09/29/2022 FINAL  Final  MRSA Next Gen by PCR, Nasal     Status: None   Collection Time: 09/23/22 11:43 PM   Specimen: Nasal Mucosa; Nasal Swab  Result Value Ref Range Status   MRSA by PCR Next Gen NOT DETECTED NOT DETECTED Final    Comment: (NOTE) The GeneXpert MRSA Assay (FDA approved for NASAL specimens only), is one component of a comprehensive MRSA colonization surveillance program. It is not intended to diagnose MRSA infection nor to guide or monitor treatment for MRSA infections. Test performance is not FDA approved in patients less than 34 years old. Performed at [facility], [place] [address], [place],  [address]     Radiology Studies: No results found.    Mirage Pfe[address] [facility]  If 7PM-7AM, please contact night-coverage [email or web address] 09/30/2022, 4:45 PM

## 2022-09-30 NOTE — Plan of Care (Signed)
  Problem: Education: Goal: Ability to describe self-care measures that may prevent or decrease complications (Diabetes Survival Skills Education) will improve Outcome: Progressing Goal: Individualized Educational Video(s) Outcome: Progressing   Problem: Coping: Goal: Ability to adjust to condition or change in health will improve Outcome: Progressing   Problem: Fluid Volume: Goal: Ability to maintain a balanced intake and output will improve Outcome: Progressing   Problem: Health Behavior/Discharge Planning: Goal: Ability to identify and utilize available resources and services will improve Outcome: Progressing Goal: Ability to manage health-related needs will improve Outcome: Progressing   Problem: Metabolic: Goal: Ability to maintain appropriate glucose levels will improve Outcome: Progressing   Problem: Nutritional: Goal: Maintenance of adequate nutrition will improve Outcome: Progressing Goal: Progress toward achieving an optimal weight will improve Outcome: Progressing   Problem: Skin Integrity: Goal: Risk for impaired skin integrity will decrease Outcome: Progressing   Problem: Tissue Perfusion: Goal: Adequacy of tissue perfusion will improve Outcome: Progressing   Problem: Education: Goal: Knowledge of General Education information will improve Description: Including pain rating scale, medication(s)/side effects and non-pharmacologic comfort measures Outcome: Progressing   Problem: Health Behavior/Discharge Planning: Goal: Ability to manage health-related needs will improve Outcome: Progressing   Problem: Clinical Measurements: Goal: Ability to maintain clinical measurements within normal limits will improve Outcome: Progressing Goal: Will remain free from infection Outcome: Progressing Goal: Diagnostic test results will improve Outcome: Progressing Goal: Respiratory complications will improve Outcome: Progressing Goal: Cardiovascular complication will  be avoided Outcome: Progressing   Problem: Activity: Goal: Risk for activity intolerance will decrease Outcome: Progressing   Problem: Nutrition: Goal: Adequate nutrition will be maintained Outcome: Progressing   Problem: Coping: Goal: Level of anxiety will decrease Outcome: Progressing   Problem: Elimination: Goal: Will not experience complications related to bowel motility Outcome: Progressing Goal: Will not experience complications related to urinary retention Outcome: Progressing   Problem: Pain Managment: Goal: General experience of comfort will improve Outcome: Progressing   Problem: Safety: Goal: Ability to remain free from injury will improve Outcome: Progressing   Problem: Skin Integrity: Goal: Risk for impaired skin integrity will decrease Outcome: Progressing   Problem: Safety: Goal: Non-violent Restraint(s) Outcome: Progressing   Problem: Education: Goal: Knowledge of the prescribed therapeutic regimen will improve Outcome: Progressing   Problem: Coping: Goal: Ability to identify and develop effective coping behavior will improve Outcome: Progressing   Problem: Clinical Measurements: Goal: Quality of life will improve Outcome: Progressing   Problem: Respiratory: Goal: Verbalizations of increased ease of respirations will increase Outcome: Progressing   Problem: Role Relationship: Goal: Family's ability to cope with current situation will improve Outcome: Progressing Goal: Ability to verbalize concerns, feelings, and thoughts to partner or family member will improve Outcome: Progressing   Problem: Pain Management: Goal: Satisfaction with pain management regimen will improve Outcome: Progressing

## 2022-09-30 NOTE — Care Management Important Message (Signed)
Important Message  Patient Details  Name: Paul Terrell MRN: [id number] Date of Birth: 26-Jan-1931   Medicare Important Message Given:  Yes     Shelda Altes 09/30/2022, 8:16 AM

## 2022-09-30 NOTE — Progress Notes (Signed)
Palliative Care Progress Note, Assessment & Plan   Patient Name: Paul Terrell       Date: 09/30/2022 DOB: 1931-04-24  Age: 89 y.o. MRN#: [id number] Attending Physician: Mercy Riding, MD Primary Care Physician: Tonia Ghent, MD Admit Date: 09/12/2022  Subjective: Patient is lying in bed in no apparent distress.  Respirations are even and unlabored.  He does not acknowledge my presence  and is unable to make his wishes known.  His son Paul Terrell and Paul Terrell daughter are at bedside.  HPI:  89 y.o. male  with past medical history of type 2 diabetes, HLD, HTN, BPH, and hard of hearing admitted on 09/19/2022 with weakness and multiple ground-level falls at home.  Patient has been diagnosed with acute metabolic encephalopathy, sepsis, acute respiratory failure, tested positive for RSV, AKI, and mild rhabdomyolysis.  On 1/2, patient experienced respiratory distress.  On 1/3, patient had TTE which revealed LVEF of 30 to 35%, RWMA, and G3-DD.  Patient is receiving IV antibiotics and has had intermittent use of restraints due to agitation. 1/7, patient family shifted care to solely comfort measures.    PMT was consulted to discuss goals of care.  Summary of counseling/coordination of care: After reviewing the patient's chart and assessing the patient at bedside, I spoke with patient's son and daughter at bedside in regards to treatment plan and goals of care.  Discussed symptom management with as needed medications while continuing to focus on comfort.  Son confirms patient is no longer agitated and has not been in pain or shown signs of discomfort since early this morning.  Hospice philosophy, hospice inpatient unit, and in-hospital death discussed with patient's son and daughter.  Family states they are comfortable and  would like for patient to remain in the hospital.  However, they understand that if the patient cannot remain in the hospital they are open to inpatient hospice placement. They prefer [facility] as it is easily accessible from their homes in [place].   Discussed need for hospice evaluation for eligibility as well as allowing 24 hours to see if patient is medically stable for transfer out of hospital.  [facility] and attending made aware of family's wishes.  Full comfort measures continue.  I counseled with dayshift RN who is in agreement that current medication regimen is appropriate to adequately manage patient's EOL symptoms. No medication adjustments needed at this time.   Therapeutic silence and active listening provided for Paul Terrell and Paul Terrell to share their thoughts and emotions regarding patient's current medical situation.  Emotional support provided.  PMT will continue to monitor.   Physical Exam Vitals reviewed.  Constitutional:      General: He is not in acute distress. HENT:     Head: Normocephalic.     Mouth/Throat:     Mouth: Mucous membranes are moist.  Pulmonary:     Effort: Pulmonary effort is normal. No respiratory distress.  Abdominal:     Palpations: Abdomen is soft.  Musculoskeletal:     Comments: Does not MAETC  Skin:    General: Skin is warm and dry.  Neurological:     Comments: Non-responsive to verbal or physical stimuli  Palliative Assessment/Data: 10%    Total Time 50 minutes   Thank you for allowing the Palliative Medicine Team to assist in the care of this patient.  Melville Ilsa Iha, FNP-BC Palliative Medicine Team Team Phone # [phone]

## 2022-09-30 NOTE — TOC Initial Note (Addendum)
Transition of Care (TOC) - Initial/Assessment Note   Spoke to patient's son [name] and daughter Jeani Hawking at bedside.   Spoke to [employer] NP with palliative , she will reassess patient tomorrow to see if he is stable for transfer to residential hospice . Discussed with son and daughter , offered choice ,they prefer [facility].   Called [facility] left message. Secure chatted Roselee Nova   Patient Details  Name: Paul Terrell MRN: [id number] Date of Birth: September 25, 1930  Transition of Care [facility]) CM/SW Contact:    Marilu Favre, RN Phone Number: 09/30/2022, 3:26 PM  Clinical Narrative:                   Expected Discharge Plan: [place]     Patient Goals and CMS Choice   CMS Medicare.gov Compare Post Acute Care list provided to:: Patient Represenative (must comment) (son [name] and daughter Jeani Hawking)        Expected Discharge Plan and Services   Discharge Planning Services: CM Consult Post Acute Care Choice: Hospice                   DME Arranged: N/A                    Prior Living Arrangements/Services   Lives with:: Spouse                   Activities of Daily Living      Permission Sought/Granted                  Emotional Assessment              Admission diagnosis:  Altered mental status, unspecified altered mental status type [R41.82] Acute hypoxic respiratory failure ([place]) [J96.01] Patient Active Problem List   Diagnosis Date Noted   End of life care 09/29/2022   Type 2 MI (myocardial infarction) ([place]) 09/26/2022   Acute on chronic combined systolic and diastolic CHF (congestive heart failure) ([place]) 09/25/2022   CAP (community acquired pneumonia) 09/25/2022   Sinus tachycardia 09/24/2022   Delirium 09/24/2022   Goals of care, counseling/discussion 09/24/2022   Acute respiratory failure with hypoxia and hypercapnia ([place]) 09/23/2022   RSV (respiratory syncytial virus infection) 09/23/2022   Generalized weakness  09/23/2022   Fall at home, initial encounter 09/23/2022   AKI (acute kidney injury) ([place]) 09/23/2022   Hypokalemia 09/23/2022   Severe sepsis ([place]) 09/23/2022   Skin lesion 05/08/2022   Trochanteric bursitis 05/08/2022   Yellow jacket sting 05/08/2022   Hearing loss 02/19/2021   Risk for falls 11/22/2020   Memory change 11/22/2020   Compression fracture of L1 lumbar vertebra ([place]) 05/08/2020   Lower back pain 04/23/2020   Aortic aneurysm ([place]) 93/79/0240   Acute metabolic encephalopathy 97/35/3299   GERD (gastroesophageal reflux disease)    Thrombocytopenia (HCC)    Anemia 05/14/2018   Essential hypertension 05/14/2018   Unsteadiness on feet 11/05/2017   Spinal stenosis of lumbar region 11/08/2015   Advance care planning 11/04/2014   Leg pain 01/30/2014   Medicare annual wellness visit, subsequent 11/01/2013   BPH (benign prostatic hyperplasia)    DM2 (diabetes mellitus, type 2) ([place])    Hyperlipidemia    Hypertensive heart disease    CAD (coronary artery disease)    PCP:  Tonia Ghent, MD Pharmacy:   [facility] - [place], [place] - [address] [place]  [address] Phone: [phone] Fax: [phone]  [facility] [address] [place] [address] Phone: [phone] Fax: [phone]     Social Determinants of Health (SDOH) Social History: SDOH Screenings   Food Insecurity: No Food Insecurity (01/23/2022)  Depression (PHQ2-9): Low Risk  (09/06/2022)  Financial Resource Strain: Low Risk  (01/23/2022)  Tobacco Use: Medium Risk (09/23/2022)   SDOH Interventions:     Readmission Risk Interventions     No data to display

## 2022-10-01 DIAGNOSIS — J9602 Acute respiratory failure with hypercapnia: Secondary | ICD-10-CM | POA: Diagnosis not present

## 2022-10-01 DIAGNOSIS — I5043 Acute on chronic combined systolic (congestive) and diastolic (congestive) heart failure: Secondary | ICD-10-CM | POA: Diagnosis not present

## 2022-10-01 DIAGNOSIS — Z515 Encounter for palliative care: Secondary | ICD-10-CM | POA: Diagnosis not present

## 2022-10-01 DIAGNOSIS — B338 Other specified viral diseases: Secondary | ICD-10-CM | POA: Diagnosis not present

## 2022-10-01 DIAGNOSIS — J9601 Acute respiratory failure with hypoxia: Secondary | ICD-10-CM | POA: Diagnosis not present

## 2022-10-01 MED ORDER — HYDROMORPHONE BOLUS VIA INFUSION: 0.5000 mg | INTRAVENOUS | Status: DC | PRN

## 2022-10-01 MED ORDER — HYDROMORPHONE HCL-NACL 50-0.9 MG/50ML-% IV SOLN
0.5000 mg/h | INTRAVENOUS | Status: DC
  Administered 2022-10-01: 0.5 mg/h via INTRAVENOUS
  Filled 2022-10-01: qty 50

## 2022-10-03 ENCOUNTER — Telehealth: Payer: Self-pay | Admitting: Family Medicine

## 2022-10-03 NOTE — Telephone Encounter (Signed)
Called his son, gave my condolences.   I was always glad to see this gentleman in clinic.  He was a kind man and he will be missed.   Son thanked me for the call.

## 2022-10-24 NOTE — Progress Notes (Signed)
Palliative Care Progress Note, Assessment & Plan   Patient Name: Paul Terrell       Date: 10-30-22 DOB: [birth year]  Age: 89 y.o. MRN#: [id number] Attending Physician: Mercy Riding, MD Primary Care Physician: Tonia Ghent, MD Admit Date: 09/07/2022  Subjective: Patient appears to be in mild distress.  He is having Cheyne-Stokes breathing with fidgeting and looking unsettled in the bed.  Dr. Cyndia Skeeters is at bedside and shortly after our assessment patient's son Paul Terrell arrived at bedside.  HPI: 89 y.o. male  with past medical history of type 2 diabetes, HLD, HTN, BPH, and hard of hearing admitted on 09/14/2022 with weakness and multiple ground-level falls at home.  Patient has been diagnosed with acute metabolic encephalopathy, sepsis, acute respiratory failure, tested positive for RSV, AKI, and mild rhabdomyolysis.  On 1/2, patient experienced respiratory distress.  On 1/3, patient had TTE which revealed LVEF of 30 to 35%, RWMA, and G3-DD.  Patient is receiving IV antibiotics and has had intermittent use of restraints due to agitation. 1/7, patient family shifted care to solely comfort measures.    PMT was consulted to discuss goals of care.  Summary of counseling/coordination of care: After reviewing the patient's chart and assessing the patient at bedside, I spoke with Dr. Cyndia Skeeters in regards to plan of care.  Patient did not receive medication (for pain, agitation, secretions) since midnight.  Patient appears to be in mild distress and agitation.  Counseled with dayshift RN Sophia.  Advised converting patient's medications from as needed to gtt. in order to better manage his pain and control his EOL symptoms.  Dilaudid as needed discontinued and Dilaudid gtt. initiated with ability to bolus from infusion bag.    Advised RN to give Dilaudid '1mg'$  IV and Ativan now.  I highlighted that vital signs should be taken at family's request.  RN in agreement.  Patient not stable for evaluation or transfer to hospice inpatient unit at this time.  I anticipate an in-hospital death.  Also discussed that with Dilaudid gtt that if 2 or more boluses are given then basal rate of Dilaudid should be increased by 0.5 mg/h.  I discussed shifting from as needed medications to gtt. with patient's son Paul Terrell at bedside who was appreciative and in agreement for this adjustment.  Therapeutic silence and active listening provided for Paul Terrell to share his thoughts and emotions regarding current medical situation.  Emotional support provided.   Physical Exam Vitals reviewed.  Constitutional:      General: He is in acute distress.     Appearance: He is ill-appearing.  HENT:     Head: Normocephalic.     Mouth/Throat:     Mouth: Mucous membranes are moist.  Cardiovascular:     Rate and Rhythm: Tachycardia present.  Pulmonary:     Comments: Cheynes-stokes respirations Abdominal:     Palpations: Abdomen is soft.  Musculoskeletal:     Comments: Fidgeting, restless, unable to [facility]  Skin:    General: Skin is warm and dry.             Palliative Assessment/Data: 10%    Total Time 50 minutes   Thank you for allowing the Palliative Medicine Team  to assist in the care of this patient.  [place] Ilsa Iha, FNP-BC Palliative Medicine Team Team Phone # [phone]

## 2022-10-24 NOTE — Plan of Care (Signed)
Patient passed at 1311, quietly.  Son and daughter had stepped away for lunch, called Jeani Hawking and told her.  They immediately returned to room.  Notified [place] and [place] of passing and thanked them for allowing me to care for patient.  Straightened patient in bed and covered pt, closed eyes.  Retrieved tissues for family.  Disconnected IV tubing.  Family to visit as long as desired.  Edythe Clarity, RN

## 2022-10-24 NOTE — Plan of Care (Signed)
Pt agitated this am.  Lungs noted to be corse crackles, saturations 75% RA.  Feet are mottled and weak pulses noted.  Medicated pt with '1mg'$  dilaudid per order.  Son not present at this time.  Patient seemed to calm down.  Will continue to monitor. Edythe Clarity, RN

## 2022-10-24 NOTE — Death Summary Note (Signed)
DEATH SUMMARY   Patient Details  Name: Paul Terrell MRN: [id number] DOB: 1930/10/13 [id number], [name], MD Admission/Discharge Information   Admit Date:  24-Sep-2022  Date of Death: Date of Death: 10/03/22  Time of Death: Time of Death: 10  Length of Stay: 8   Principle Cause of death: Severe sepsis  Hospital Diagnoses: Principal Problem:   Acute respiratory failure with hypoxia and hypercapnia (HCC) Active Problems:   DM2 (diabetes mellitus, type 2) (HCC)   Hyperlipidemia   CAD (coronary artery disease)   BPH (benign prostatic hyperplasia)   Essential hypertension   Acute metabolic encephalopathy   RSV (respiratory syncytial virus infection)   Generalized weakness   Fall at home, initial encounter   AKI (acute kidney injury) ([place])   Hypokalemia   Severe sepsis (HCC)   Sinus tachycardia   Delirium   Goals of care, counseling/discussion   Acute on chronic combined systolic and diastolic CHF (congestive heart failure) ([place])   CAP (community acquired pneumonia)   Type 2 MI (myocardial infarction) ([place])   End of life care   Hospital Course: 89 year old M with PMH of CAD, poorly controlled DM-2, HLD, HTN, BPH and hard of hearing presenting with generalized weakness and multiple ground-level falls at home, and admitted for sepsis and acute respiratory failure with hypoxia in the setting of RSV infection, AKI, acute metabolic encephalopathy and mild rhabdomyolysis. Cr 1.33.  CK elevated to 1000.  CT head without acute finding.  CXR revealed stable cardiomegaly with mild left basilar linear atelectasis.  RSV positive.   Patient developed acute respiratory distress with acute respiratory failure with hypoxia and hypercapnia, acute respiratory acidosis and tachycardia the evening of 1/2.  ABG with acute respiratory acidosis and hypercapnia.  CXR showed diffuse increase in interstitial opacities, left> right suggesting pulmonary edema.  BNP elevated to 1200.  Troponin elevated to  1100>> 900.  Patient was started on BiPAP, IV Lasix, IV heparin, Solu-Medrol and nebulizers.   On 1/3, TTE showed LVEF of 30 to 35%, RWMA, G3-DD.  Cardiology gave recommendation and signed off.  Respiratory failure resolved but patient remained delirious requiring pharmacologic and physical restraints.  Palliative medicine consulted.   Eventually, patient was transitioned to full comfort care on 01-Oct-2022, and passed away on 2022-10-03 at 1:11 PM with family member at bedside.   Consultations: Cardiology and palliative medicine  The results of significant diagnostics from this hospitalization (including imaging, microbiology, ancillary and laboratory) are listed below for reference.   Significant Diagnostic Studies: ECHOCARDIOGRAM COMPLETE  Result Date: 09/25/2022    ECHOCARDIOGRAM REPORT   Patient Name:   SCOT[place] KORVER [facility] Date of Exam: 09/25/2022 Medical Rec #:  [id number]   Height:       68.0 in Accession #:    [id number]  Weight:       170.6 lb Date of Birth:  Apr 08, 1931   BSA:          1.910 m Patient Age:    43 years    BP:           106/67 mmHg Patient Gender: M           HR:           89 bpm. Exam Location:  Inpatient Procedure: 2D Echo and Intracardiac Opacification Agent Indications:    elevated troponin  History:        Patient has no prior history of Echocardiogram examinations.  CAD; Risk Factors:Hypertension, Dyslipidemia and Diabetes.  Sonographer:    Harvie Junior Referring Phys: [id number] Charlesetta Ivory Keshun Berrett  Sonographer Comments: Technically difficult study due to poor echo windows. Image acquisition challenging due to respiratory motion. IMPRESSIONS  1. Left ventricular ejection fraction, by estimation, is 30 to 35%. The left ventricle has moderately decreased function. The left ventricle demonstrates regional wall motion abnormalities (suggestive of LAD disease, no LV thrombus). Left ventricular diastolic parameters are consistent with Grade III diastolic dysfunction (restrictive).  2.  Right ventricular systolic function was not well visualized. The right ventricular size is normal. Tricuspid regurgitation signal is inadequate for assessing PA pressure.  3. The mitral valve was not well visualized. No evidence of mitral valve regurgitation.  4. The aortic valve was not well visualized. Aortic valve regurgitation is not visualized.  5. The inferior vena cava is normal in size with greater than 50% respiratory variability, suggesting right atrial pressure of 3 mmHg. Comparison(s): No prior Echocardiogram. Conclusion(s)/Recommendation(s): Reaching out to primary team. FINDINGS  Left Ventricle: Left ventricular ejection fraction, by estimation, is 30 to 35%. The left ventricle has moderately decreased function. The left ventricle demonstrates regional wall motion abnormalities. The left ventricular internal cavity size was normal in size. There is no left ventricular hypertrophy. Left ventricular diastolic parameters are consistent with Grade III diastolic dysfunction (restrictive).  LV Wall Scoring: The mid and distal anterior septum, mid inferoseptal segment, apical anterior segment, apical inferior segment, and apex are akinetic. The mid and distal lateral wall is hypokinetic. Right Ventricle: The right ventricular size is normal. No increase in right ventricular wall thickness. Right ventricular systolic function was not well visualized. Tricuspid regurgitation signal is inadequate for assessing PA pressure. Left Atrium: Left atrial size was not well visualized. Right Atrium: Right atrial size was not well visualized. Pericardium: There is no evidence of pericardial effusion. Mitral Valve: The mitral valve was not well visualized. No evidence of mitral valve regurgitation. Tricuspid Valve: The tricuspid valve is not well visualized. Tricuspid valve regurgitation is not demonstrated. Aortic Valve: The aortic valve was not well visualized. Aortic valve regurgitation is not visualized. Pulmonic  Valve: The pulmonic valve was not well visualized. Pulmonic valve regurgitation is not visualized. Aorta: The aortic root and ascending aorta are structurally normal, with no evidence of dilitation. Venous: The inferior vena cava is normal in size with greater than 50% respiratory variability, suggesting right atrial pressure of 3 mmHg. IAS/Shunts: The interatrial septum was not well visualized.  LEFT VENTRICLE PLAX 2D LVIDd:         4.30 cm      Diastology LVIDs:         3.40 cm      LV e' medial:    4.46 cm/s LV PW:         1.20 cm      LV E/e' medial:  27.4 LV IVS:        1.20 cm      LV e' lateral:   4.35 cm/s LVOT diam:     2.20 cm      LV E/e' lateral: 28.0 LV SV:         47 LV SV Index:   25 LVOT Area:     3.80 cm  LV Volumes (MOD) LV vol d, MOD A2C: 93.7 ml LV vol d, MOD A4C: 142.0 ml LV vol s, MOD A2C: 64.1 ml LV vol s, MOD A4C: 76.5 ml LV SV MOD A2C:     29.6 ml  LV SV MOD A4C:     142.0 ml LV SV MOD BP:      56.1 ml RIGHT VENTRICLE RV S prime:     20.30 cm/s TAPSE (M-mode): 1.8 cm LEFT ATRIUM           Index LA diam:      3.50 cm 1.83 cm/m LA Vol (A4C): 47.4 ml 24.81 ml/m  AORTIC VALVE             PULMONIC VALVE LVOT Vmax:   59.43 cm/s  PV Vmax:          0.80 m/s LVOT Vmean:  39.000 cm/s PV Peak grad:     2.6 mmHg LVOT VTI:    0.123 m     PR End Diast Vel: 4.33 msec  AORTA Ao Root diam: 3.90 cm Ao Asc diam:  3.80 cm MITRAL VALVE MV Area (PHT): 4.57 cm     SHUNTS MV Decel Time: 166 msec     Systemic VTI:  0.12 m MV E velocity: 122.00 cm/s  Systemic Diam: 2.20 cm MV A velocity: 34.30 cm/s MV E/A ratio:  3.56 Rudean Haskell MD Electronically signed by Rudean Haskell MD Signature Date/Time: 09/25/2022/12:05:33 PM    Final    DG Chest Port 1 View  Result Date: 09/25/2022 CLINICAL DATA:  Acute on chronic respiratory failure with hypoxia and hypercapnia EXAM: PORTABLE CHEST 1 VIEW COMPARISON:  Chest 09/24/2022 FINDINGS: Progression of left lower lobe infiltrate likely pneumonia. Improvement in  airspace disease in the right lung. Negative for pleural effusion IMPRESSION: Progression of left lower lobe infiltrate likely pneumonia. Electronically Signed   By: Franchot Gallo M.D.   On: 09/25/2022 08:15   DG Chest Port 1 View  Result Date: 09/24/2022 CLINICAL DATA:  Acute respiratory distress. EXAM: PORTABLE CHEST 1 VIEW COMPARISON:  09/09/2022 FINDINGS: Artifact from overlying cardiac leads. Stable cardiomediastinal contours. Diffuse increase interstitial opacities are identified, left greater than right. No signs of pleural effusion. Osseous structures appear grossly intact. IMPRESSION: Diffuse increase interstitial opacities, left greater than right, compatible with pulmonary edema. Electronically Signed   By: Kerby Moors M.D.   On: 09/24/2022 17:10   CT HEAD WO CONTRAST (5MM)  Result Date: 08/27/2022 CLINICAL DATA:  Head trauma with headaches, initial encounter EXAM: CT HEAD WITHOUT CONTRAST TECHNIQUE: Contiguous axial images were obtained from the base of the skull through the vertex without intravenous contrast. RADIATION DOSE REDUCTION: This exam was performed according to the departmental dose-optimization program which includes automated exposure control, adjustment of the mA and/or kV according to patient size and/or use of iterative reconstruction technique. COMPARISON:  08/15/2019 FINDINGS: Brain: No evidence of acute infarction, hemorrhage, hydrocephalus, extra-axial collection or mass lesion/mass effect. Chronic atrophic and ischemic changes are again seen. Vascular: No hyperdense vessel or unexpected calcification. Skull: Normal. Negative for fracture or focal lesion. Sinuses/Orbits: No acute finding. Other: None. IMPRESSION: Chronic atrophic and ischemic changes without acute abnormality. Electronically Signed   By: Inez Catalina M.D.   On: 08/31/2022 23:01   DG Chest Portable 1 View  Result Date: 08/26/2022 CLINICAL DATA:  Altered mental status. EXAM: PORTABLE CHEST 1 VIEW  COMPARISON:  January 17, 2020 FINDINGS: There is stable mild to moderate severity enlargement of the cardiac silhouette. Mild linear atelectasis is seen within the left lung base. There is no evidence of a pleural effusion or pneumothorax. The visualized skeletal structures are unremarkable. IMPRESSION: Stable cardiomegaly with mild left basilar linear atelectasis. Electronically Signed   By: Hoover Browns  Houston M.D.   On: 08/27/2022 22:29    Microbiology: Recent Results (from the past 240 hour(s))  Blood culture (routine x 2)     Status: None   Collection Time: 09/21/2022  9:34 PM   Specimen: BLOOD  Result Value Ref Range Status   Specimen Description BLOOD LEFT ANTECUBITAL  Final   Special Requests   Final    BOTTLES DRAWN AEROBIC AND ANAEROBIC Blood Culture results may not be optimal due to an excessive volume of blood received in culture bottles   Culture   Final    NO GROWTH 5 DAYS Performed at [facility], [place] [address] [place], [place] [address]    Report Status 09/27/2022 FINAL  Final  Culture, blood (Routine X 2) w Reflex to ID Panel     Status: None   Collection Time: 09/23/22  5:30 PM   Specimen: BLOOD  Result Value Ref Range Status   Specimen Description BLOOD BLOOD LEFT FOREARM  Final   Special Requests   Final    BOTTLES DRAWN AEROBIC AND ANAEROBIC Blood Culture results may not be optimal due to an excessive volume of blood received in culture bottles   Culture   Final    NO GROWTH 6 DAYS Performed at [facility], [place] [address], [place], [place] [address]    Report Status 09/29/2022 FINAL  Final  Culture, blood (Routine X 2) w Reflex to ID Panel     Status: None   Collection Time: 09/23/22  5:56 PM   Specimen: BLOOD  Result Value Ref Range Status   Specimen Description BLOOD LEFT ANTECUBITAL  Final   Special Requests   Final    BOTTLES DRAWN AEROBIC AND ANAEROBIC Blood Culture results may not be optimal due to an inadequate volume of blood received in  culture bottles   Culture   Final    NO GROWTH 6 DAYS Performed at [facility], [place] [address] [place], [place] [address]    Report Status 09/29/2022 FINAL  Final  MRSA Next Gen by PCR, Nasal     Status: None   Collection Time: 09/23/22 11:43 PM   Specimen: Nasal Mucosa; Nasal Swab  Result Value Ref Range Status   MRSA by PCR Next Gen NOT DETECTED NOT DETECTED Final    Comment: (NOTE) The GeneXpert MRSA Assay (FDA approved for NASAL specimens only), is one component of a comprehensive MRSA colonization surveillance program. It is not intended to diagnose MRSA infection nor to guide or monitor treatment for MRSA infections. Test performance is not FDA approved in patients less than 36 years old. Performed at [facility], [place] [address] [place], [place] [address]     Time spent: 35 minutes  Signed: Mercy Riding, MD 10-29-22

## 2022-10-24 NOTE — TOC Progression Note (Signed)
Transition of Care [facility]) - Progression Note    Patient Details  Name: Paul Terrell MRN: [id number] Date of Birth: 1931-02-01  Transition of Care [facility]) CM/SW [place], [place] Phone Number: 10-31-2022, 1:11 PM  Clinical Narrative:    Per medical team pt is currently not stable for transfer at this time. [facility] noting they do not have a bed today. TOC will continue to follow for additional. Needs.    Expected Discharge Plan: [place]    Expected Discharge Plan and Services   Discharge Planning Services: CM Consult Post Acute Care Choice: Hospice                   DME Arranged: N/A                     Social Determinants of Health (SDOH) Interventions SDOH Screenings   Food Insecurity: No Food Insecurity (01/23/2022)  Depression (PHQ2-9): Low Risk  (09/06/2022)  Financial Resource Strain: Low Risk  (01/23/2022)  Tobacco Use: Medium Risk (09/23/2022)    Readmission Risk Interventions     No data to display

## 2022-10-24 NOTE — Plan of Care (Signed)
NP and MD here with orders for dilaudid drip to help patient with comfort.  Per order, drip started.  Both son and daughter present at this time.  Pt is somewhat agitated.  Bolus given and drip started.  Pt immediately begins to relax.  Will continue to [employer], RN

## 2022-10-24 DEATH — deceased

## 2023-01-28 ENCOUNTER — Telehealth: Payer: Medicare Other
